# Patient Record
Sex: Female | Born: 1980 | Race: Black or African American | Hispanic: No | Marital: Single | Smoking: Former smoker
Health system: Southern US, Community
[De-identification: ages and names within clinical notes are randomized; demographics above are authoritative.]

## PROBLEM LIST (undated history)

## (undated) ENCOUNTER — Emergency Department (HOSPITAL_COMMUNITY)

## (undated) ENCOUNTER — Inpatient Hospital Stay (HOSPITAL_COMMUNITY): Payer: Self-pay

## (undated) DIAGNOSIS — E119 Type 2 diabetes mellitus without complications: Secondary | ICD-10-CM

## (undated) DIAGNOSIS — G4730 Sleep apnea, unspecified: Secondary | ICD-10-CM

## (undated) DIAGNOSIS — D649 Anemia, unspecified: Secondary | ICD-10-CM

## (undated) DIAGNOSIS — I10 Essential (primary) hypertension: Secondary | ICD-10-CM

## (undated) HISTORY — PX: TOOTH EXTRACTION: SUR596

---

## 2008-06-22 ENCOUNTER — Encounter: Payer: Self-pay | Admitting: Obstetrics and Gynecology

## 2008-06-22 ENCOUNTER — Ambulatory Visit: Payer: Self-pay | Admitting: Obstetrics and Gynecology

## 2008-06-22 ENCOUNTER — Inpatient Hospital Stay (HOSPITAL_COMMUNITY): Admission: AD | Admit: 2008-06-22 | Discharge: 2008-06-22 | Payer: Self-pay | Admitting: Obstetrics and Gynecology

## 2008-08-11 ENCOUNTER — Emergency Department (HOSPITAL_COMMUNITY): Admission: EM | Admit: 2008-08-11 | Discharge: 2008-08-11 | Payer: Self-pay | Admitting: Emergency Medicine

## 2008-10-08 ENCOUNTER — Emergency Department (HOSPITAL_COMMUNITY): Admission: EM | Admit: 2008-10-08 | Discharge: 2008-10-08 | Payer: Self-pay | Admitting: Emergency Medicine

## 2008-10-10 ENCOUNTER — Emergency Department (HOSPITAL_COMMUNITY): Admission: EM | Admit: 2008-10-10 | Discharge: 2008-10-10 | Payer: Self-pay | Admitting: Emergency Medicine

## 2008-10-17 ENCOUNTER — Emergency Department (HOSPITAL_COMMUNITY): Admission: EM | Admit: 2008-10-17 | Discharge: 2008-10-17 | Payer: Self-pay | Admitting: Emergency Medicine

## 2008-11-24 ENCOUNTER — Emergency Department (HOSPITAL_COMMUNITY): Admission: EM | Admit: 2008-11-24 | Discharge: 2008-11-24 | Payer: Self-pay | Admitting: Emergency Medicine

## 2009-02-19 ENCOUNTER — Emergency Department (HOSPITAL_COMMUNITY): Admission: EM | Admit: 2009-02-19 | Discharge: 2009-02-19 | Payer: Self-pay | Admitting: Emergency Medicine

## 2009-02-20 ENCOUNTER — Inpatient Hospital Stay (HOSPITAL_COMMUNITY): Admission: AD | Admit: 2009-02-20 | Discharge: 2009-02-20 | Payer: Self-pay | Admitting: Family Medicine

## 2009-04-18 ENCOUNTER — Inpatient Hospital Stay (HOSPITAL_COMMUNITY): Admission: AD | Admit: 2009-04-18 | Discharge: 2009-04-18 | Payer: Self-pay | Admitting: Obstetrics

## 2009-04-18 ENCOUNTER — Ambulatory Visit: Payer: Self-pay | Admitting: Physician Assistant

## 2009-05-30 ENCOUNTER — Ambulatory Visit: Payer: Self-pay | Admitting: Advanced Practice Midwife

## 2009-05-30 ENCOUNTER — Inpatient Hospital Stay (HOSPITAL_COMMUNITY): Admission: AD | Admit: 2009-05-30 | Discharge: 2009-05-31 | Payer: Self-pay | Admitting: Obstetrics

## 2009-05-31 ENCOUNTER — Inpatient Hospital Stay (HOSPITAL_COMMUNITY): Admission: AD | Admit: 2009-05-31 | Discharge: 2009-05-31 | Payer: Self-pay | Admitting: Obstetrics

## 2009-10-06 ENCOUNTER — Ambulatory Visit (HOSPITAL_COMMUNITY): Admission: RE | Admit: 2009-10-06 | Discharge: 2009-10-06 | Payer: Self-pay | Admitting: Obstetrics

## 2009-11-21 IMAGING — US US OB COMP LESS 14 WK
1 series · 14 of 28 positions shown · non-contrast
Comparison: None

CLINICAL DATA: Abnormal vaginal bleeding.

OBSTETRIC <14 WK ULTRASOUND,TRANSVAGINAL OB ULTRASOUND - MODIFY
TECHNIQUE: Transabdominal ultrasound was performed?for evaluation
of?the gestation, as well as the maternal uterus and adnexal
regions.,

[Series 1: us ob comp less 14 wks · 14 of 45 slices shown]
[im 2/45]
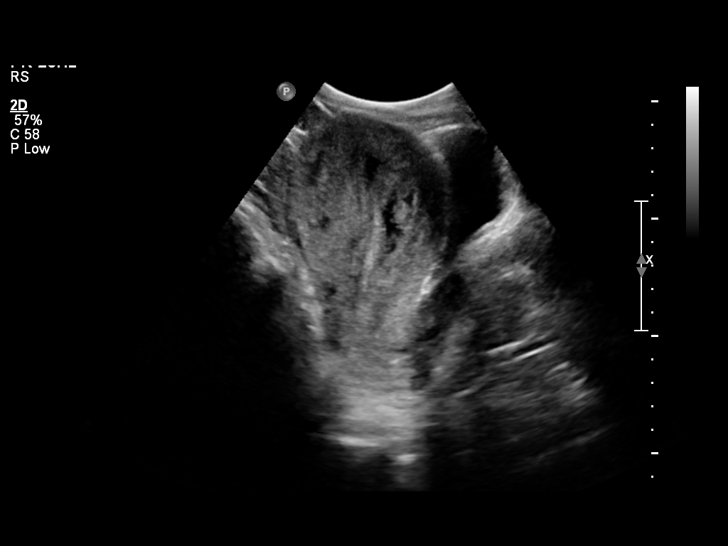
[im 5/45]
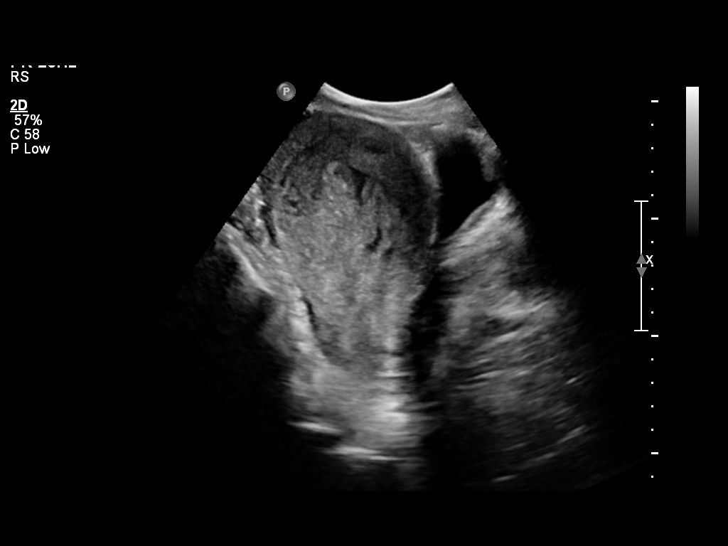
[im 9/45]
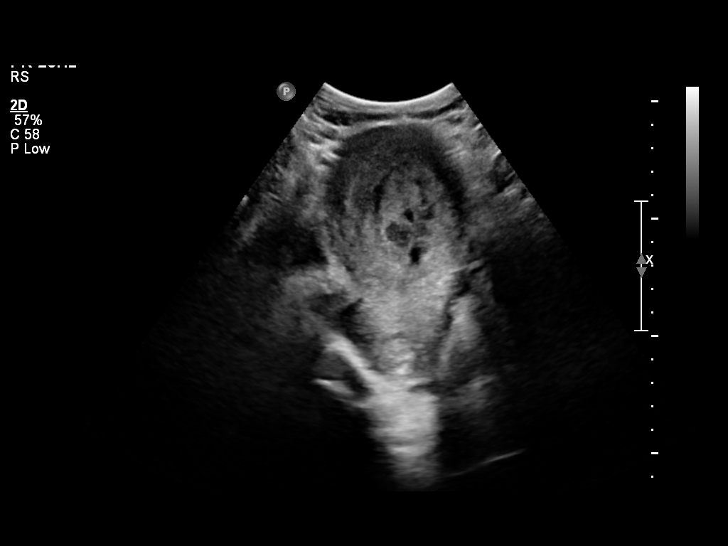
[im 12/45]
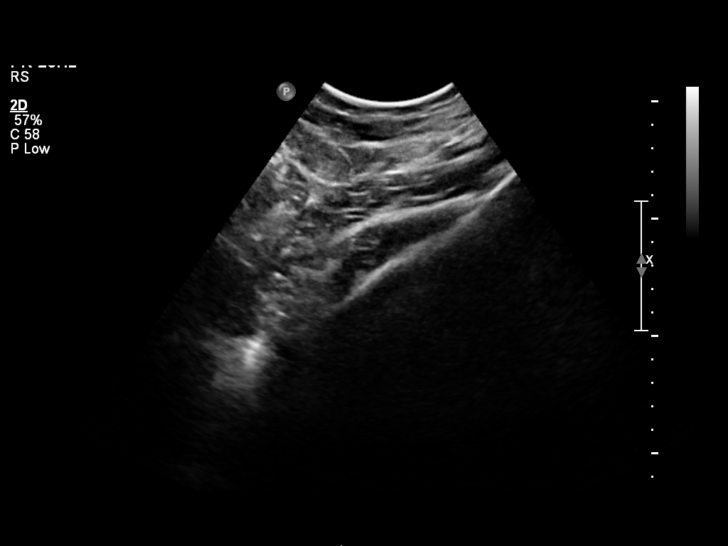
[im 15/45]
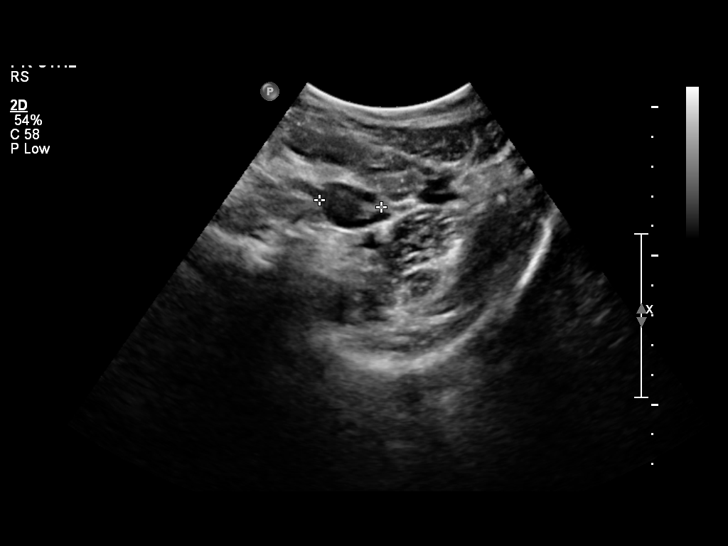
[im 18/45]
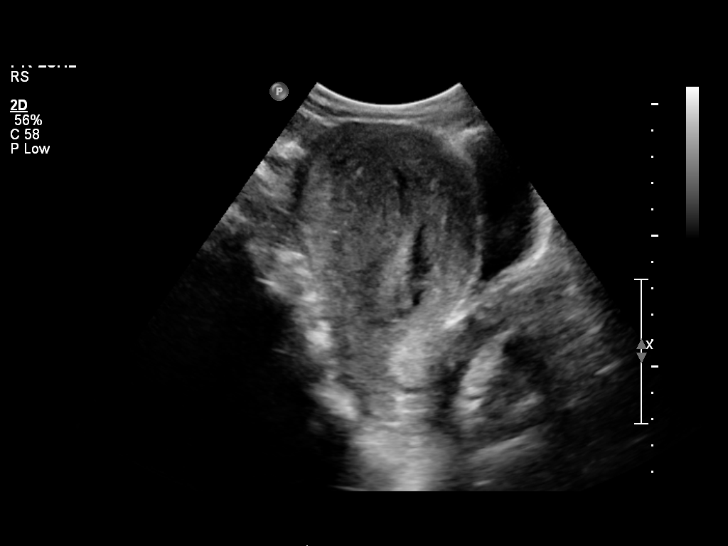
[im 22/45]
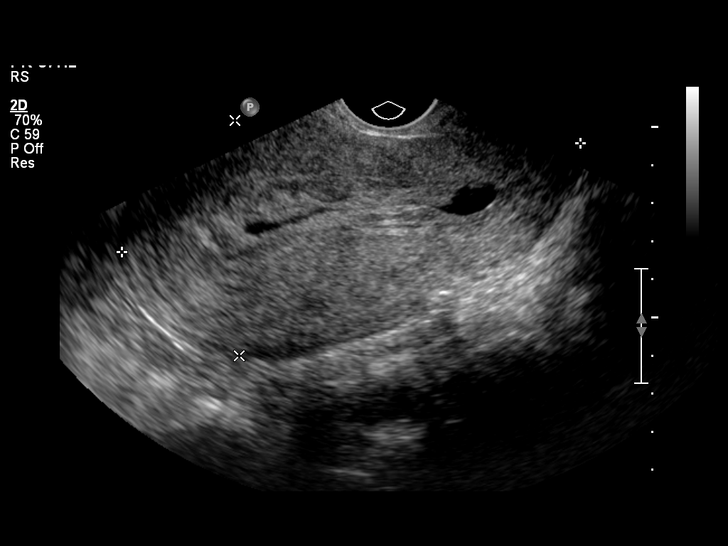
[im 25/45]
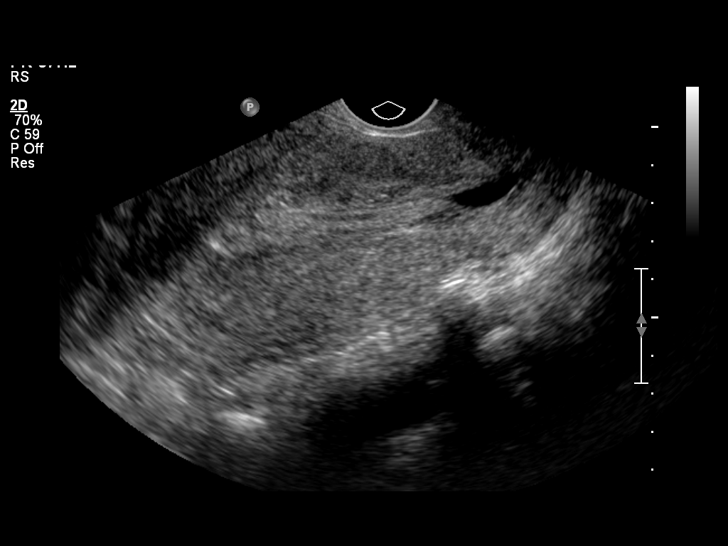
[im 28/45]
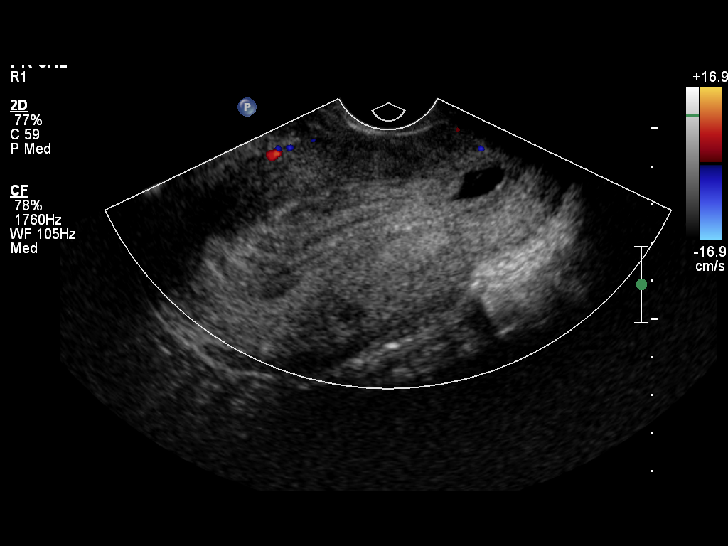
[im 31/45]
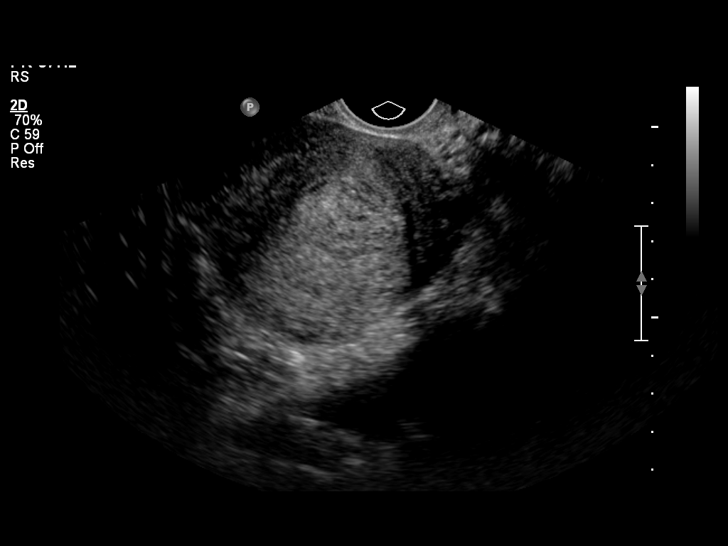
[im 35/45]
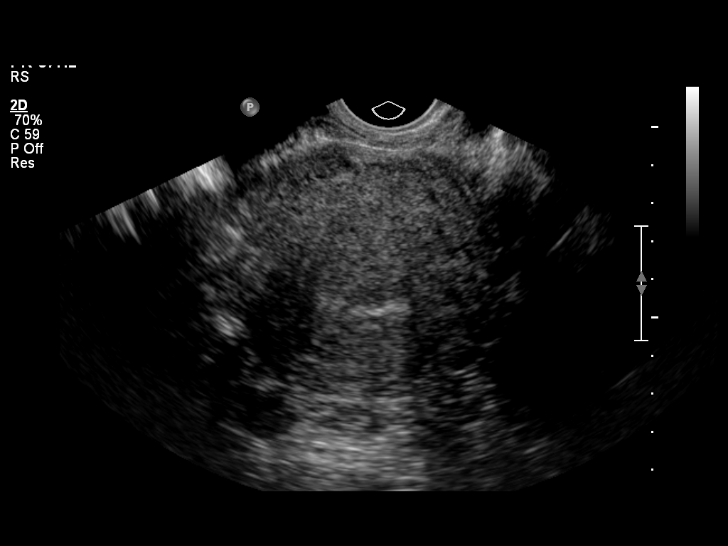
[im 38/45]
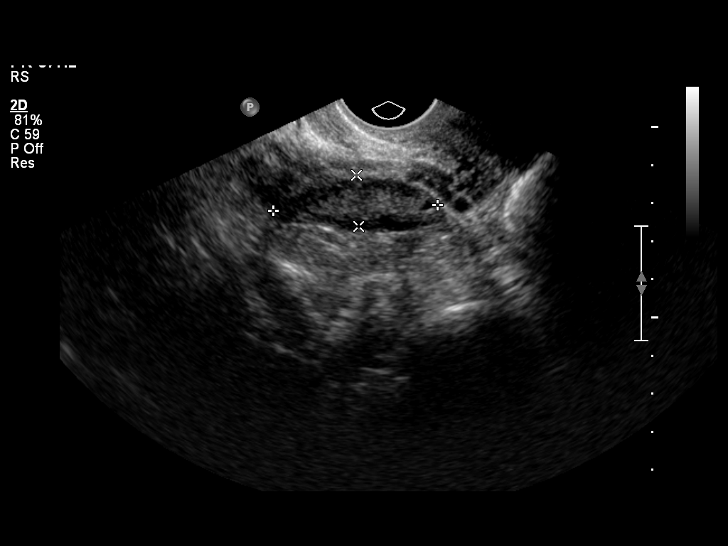
[im 41/45]
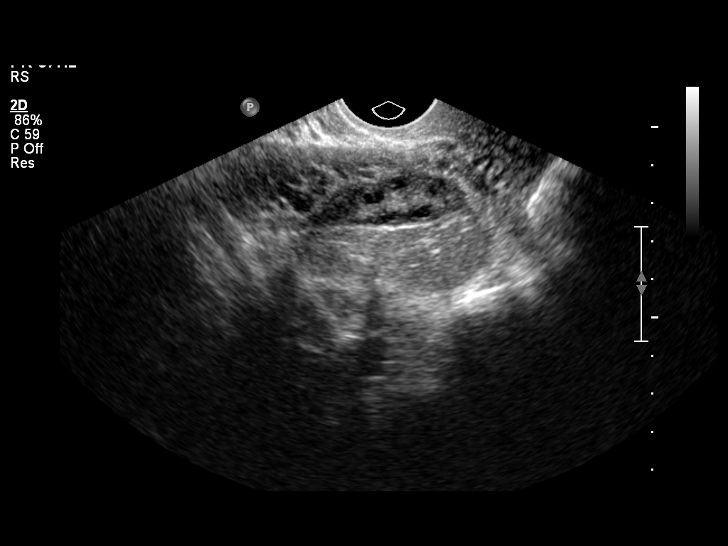
[im 45/45]
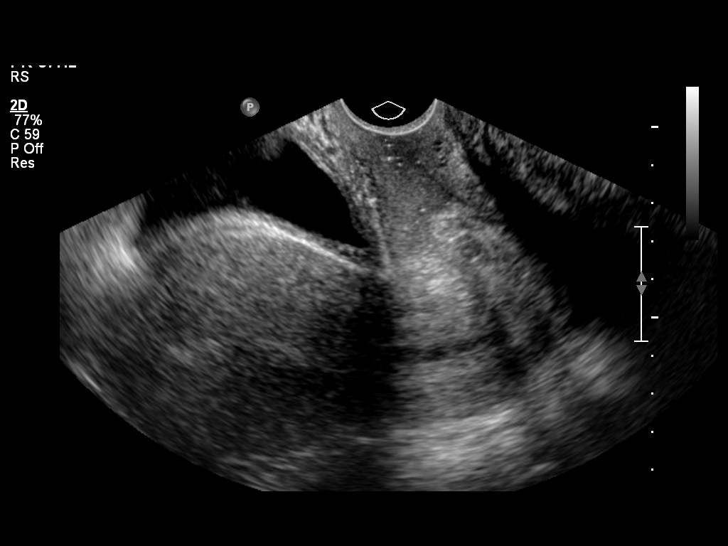

[14 of 28 positions shown; findings below may reference images not displayed]

FINDINGS: There is a distorted  small gestational sac in the lower uterine
segment with an oblong configuration without a fetal pole or yolk
sac.  The endometrium is slightly thickened and heterogeneous
without blood flow.  I feel this most likely represents a
spontaneous abortion in progress.

The ovaries are well visualized and appear normal.  There is only a
trace of free fluid in the pelvic cul-de-sac.
IMPRESSION: Spontaneous abortion in progress.  Distorted gestational sac with
no evidence of a fetus or yolk sac.

## 2009-12-24 ENCOUNTER — Ambulatory Visit (HOSPITAL_COMMUNITY): Admission: RE | Admit: 2009-12-24 | Discharge: 2009-12-24 | Payer: Self-pay | Admitting: Obstetrics

## 2010-01-05 ENCOUNTER — Emergency Department (HOSPITAL_COMMUNITY): Admission: EM | Admit: 2010-01-05 | Discharge: 2010-01-05 | Payer: Self-pay | Admitting: Emergency Medicine

## 2010-01-10 IMAGING — US US TRANSVAGINAL NON-OB
1 series · 14 of 25 positions shown · non-contrast
Comparison: Ultrasound 06/22/2008

CLINICAL DATA: Irregular bleeding

TRANSABDOMINAL AND TRANSVAGINAL ULTRASOUND OF PELVIS
TECHNIQUE: Both transabdominal and transvaginal ultrasound
examinations of the pelvis were performed including evaluation of
the uterus, ovaries, adnexal regions, and pelvic cul-de-sac.

[Series 1: us transvaginal non-ob · 0.26mm/px · 14 of 48 slices shown]
[im 1/48]
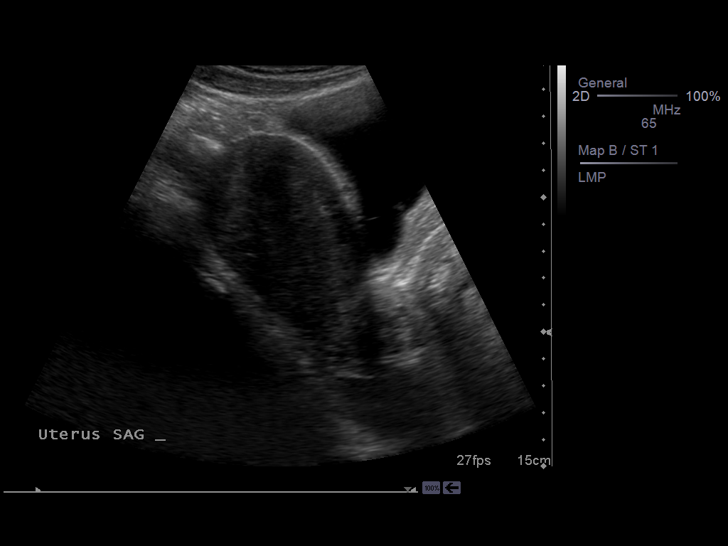
[im 4/48]
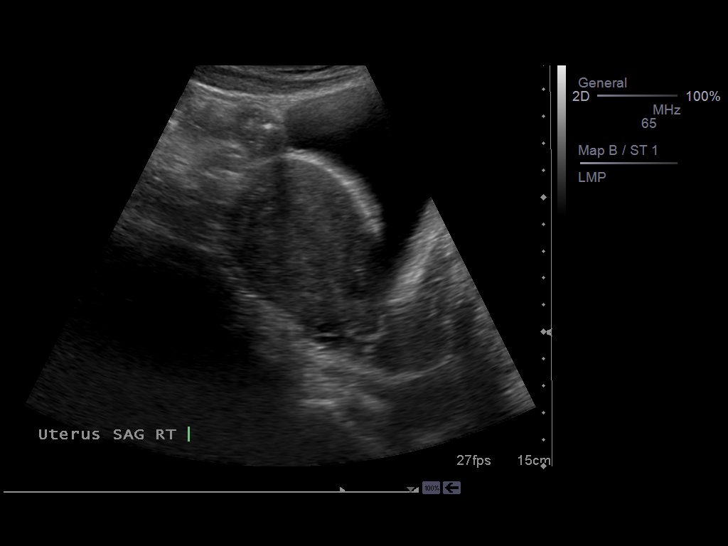
[im 8/48]
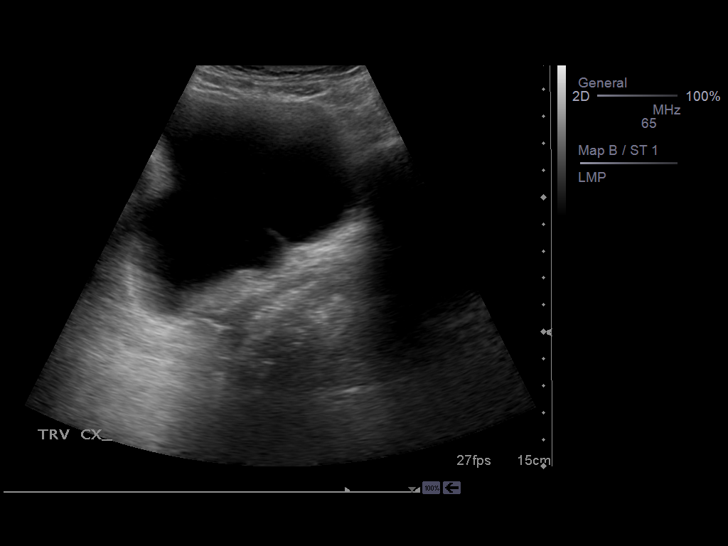
[im 12/48]
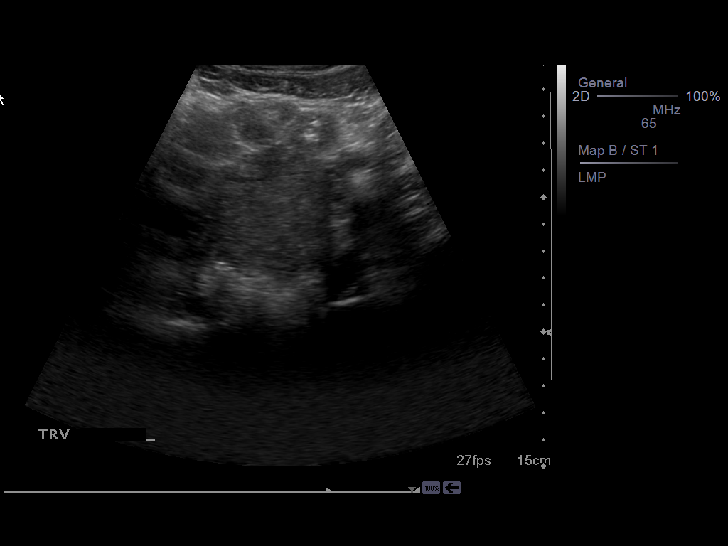
[im 16/48]
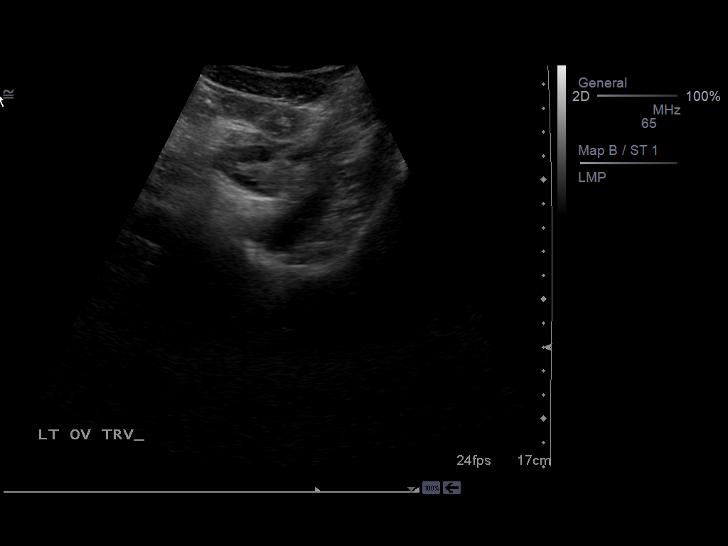
[im 18/48]
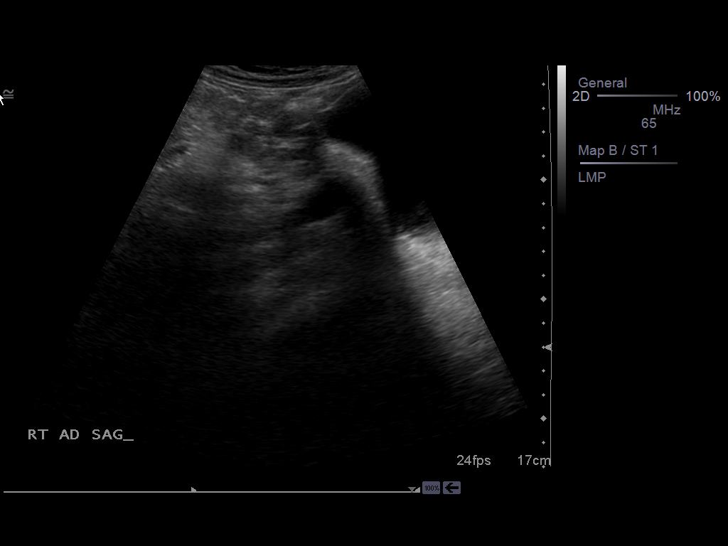
[im 22/48]
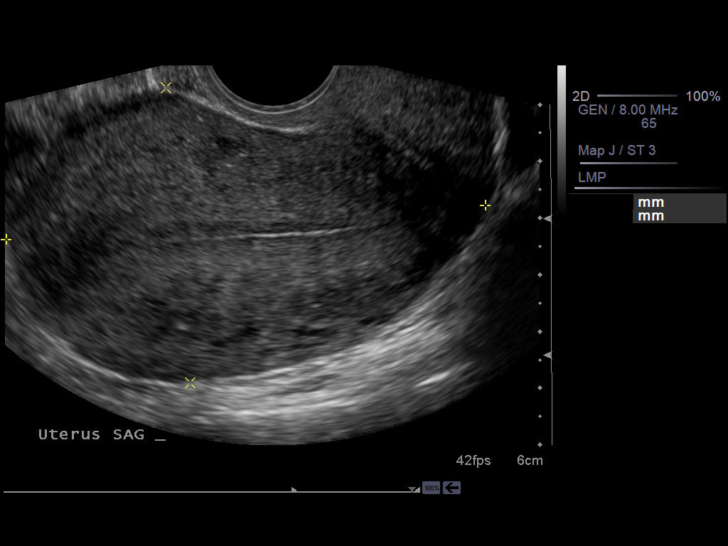
[im 26/48]
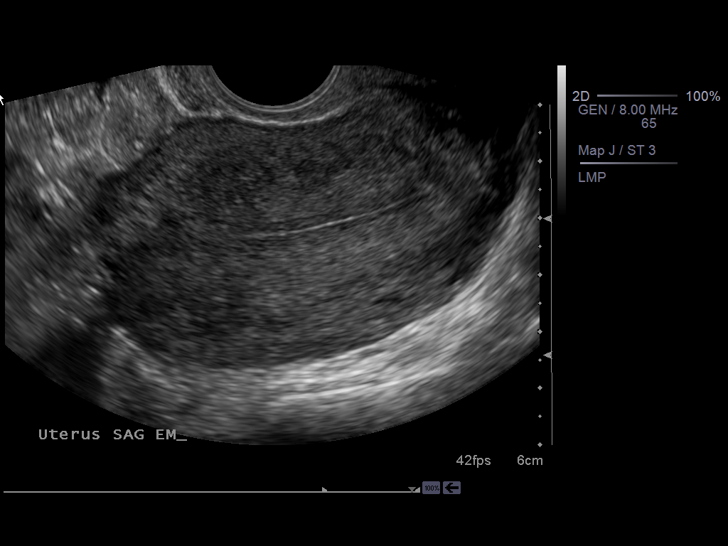
[im 30/48]
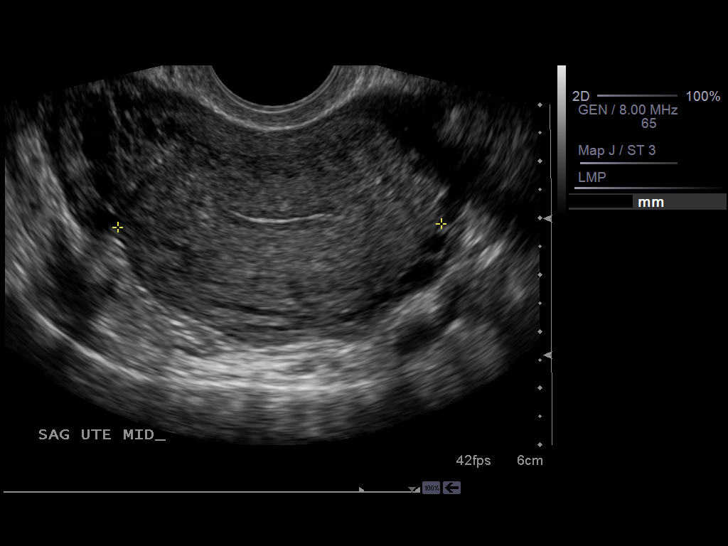
[im 32/48]
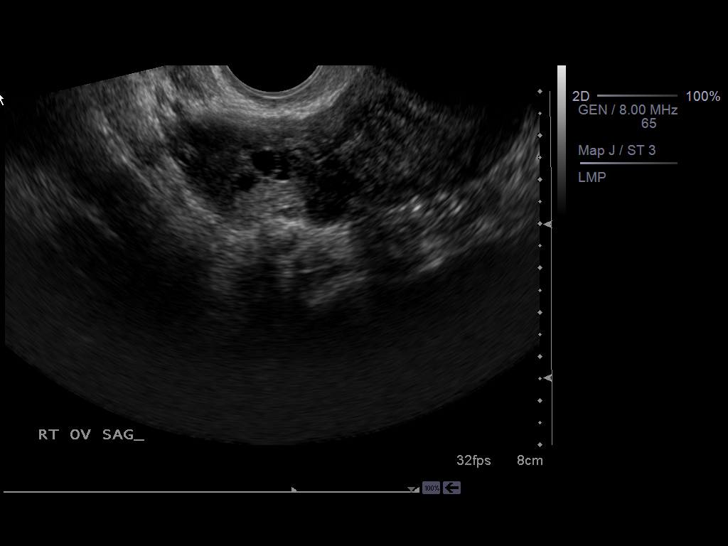
[im 36/48]
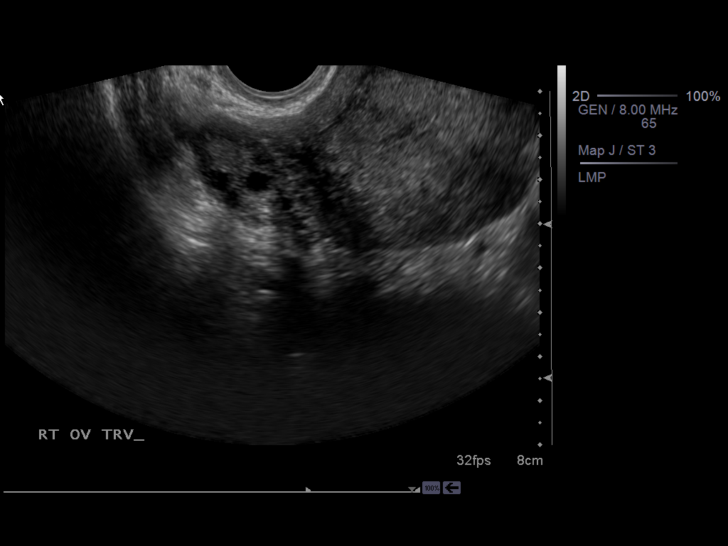
[im 40/48]
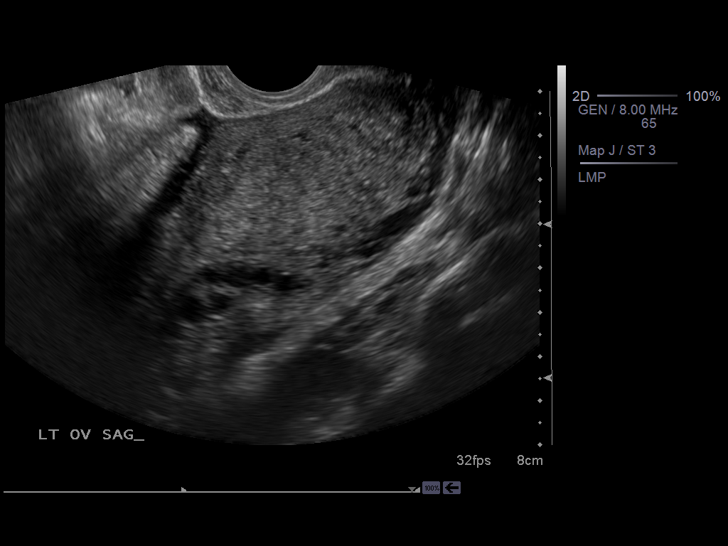
[im 44/48]
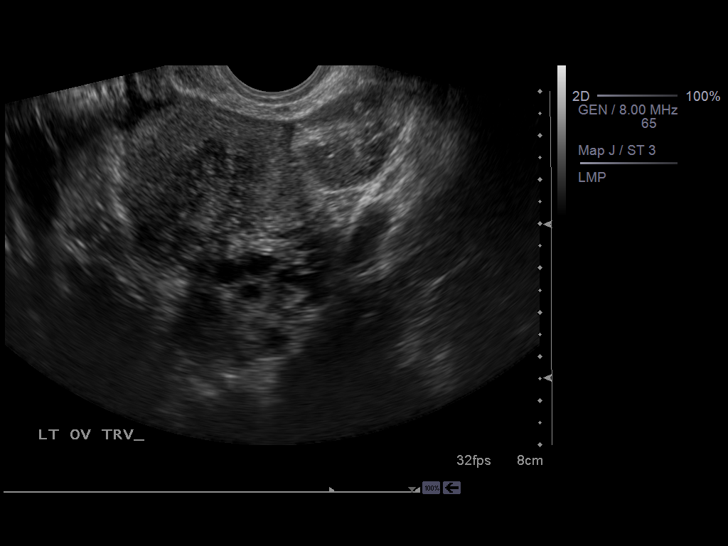
[im 48/48]
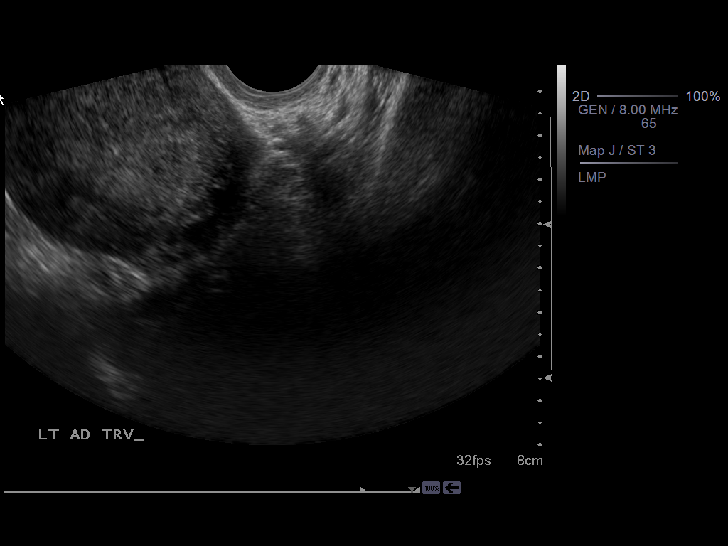

[14 of 25 positions shown; findings below may reference images not displayed]

FINDINGS: Uterus measures 9.6 cm in length and the fundus of the
uterus measures 5.2 x 6.1 cm in transverse dimensions.  Endometrial
complex measures 6 mm in thickness.  Right ovary measures 4.1 x
x 2.4 cm.  Left ovary measures 3.9 x 2.5 x 3.3 cm.  There are
bilateral ovarian follicles.  No adnexal mass is appreciated.
Moderate amount of free fluid.
IMPRESSION: Moderate free fluid, otherwise negative exam

## 2010-01-18 ENCOUNTER — Emergency Department (HOSPITAL_COMMUNITY): Admission: EM | Admit: 2010-01-18 | Discharge: 2010-01-18 | Payer: Self-pay | Admitting: Emergency Medicine

## 2010-02-07 ENCOUNTER — Encounter: Payer: Self-pay | Admitting: Orthopedic Surgery

## 2010-03-03 ENCOUNTER — Observation Stay (HOSPITAL_COMMUNITY): Admission: RE | Admit: 2010-03-03 | Discharge: 2010-03-03 | Payer: Self-pay | Admitting: Obstetrics

## 2010-03-08 ENCOUNTER — Ambulatory Visit: Payer: Self-pay | Admitting: Obstetrics & Gynecology

## 2010-03-08 ENCOUNTER — Inpatient Hospital Stay (HOSPITAL_COMMUNITY): Admission: AD | Admit: 2010-03-08 | Discharge: 2010-03-08 | Payer: Self-pay | Admitting: Obstetrics & Gynecology

## 2010-03-09 ENCOUNTER — Inpatient Hospital Stay (HOSPITAL_COMMUNITY): Admission: AD | Admit: 2010-03-09 | Discharge: 2010-03-15 | Payer: Self-pay | Admitting: Obstetrics

## 2010-03-12 ENCOUNTER — Encounter: Payer: Self-pay | Admitting: Obstetrics

## 2010-03-17 ENCOUNTER — Inpatient Hospital Stay (HOSPITAL_COMMUNITY): Admission: AD | Admit: 2010-03-17 | Discharge: 2010-03-19 | Payer: Self-pay | Admitting: Obstetrics

## 2010-03-17 ENCOUNTER — Encounter: Payer: Self-pay | Admitting: Emergency Medicine

## 2010-03-18 IMAGING — CT CT ANGIO CHEST
2 of 6 series · 19 of 36 positions shown · IV contrast (APPLIED)
Comparison: Chest x-rays same day

CLINICAL DATA: Elevated D-dimer, left breast pain

CHEST CT WITHOUT CONTRAST
TECHNIQUE: Multidetector CT imaging of the chest was performed
following the standard protocol without intravenous contrast.

[Series 10: pulm embolism 1.0 b25f thins · axial · 0.62mm/px · z∈[-257,-27]mm · 18 of 256 slices shown]
[im 13/256  lung]
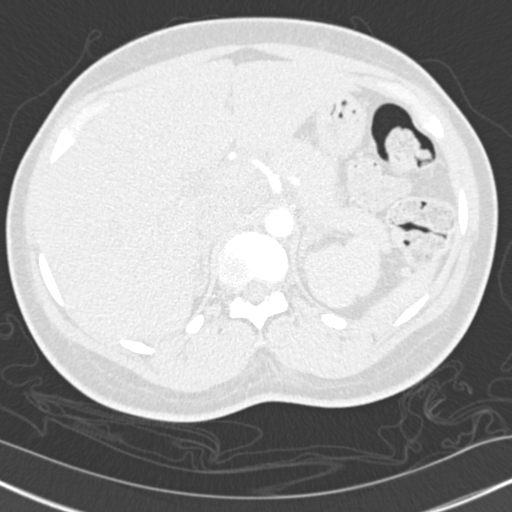
[im 26/256  mediastinal]
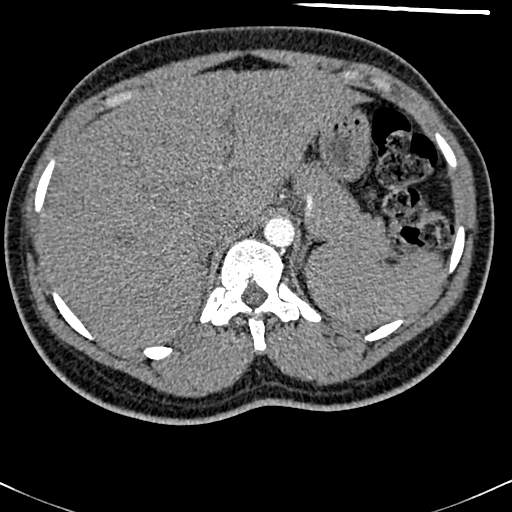
[im 39/256  lung]
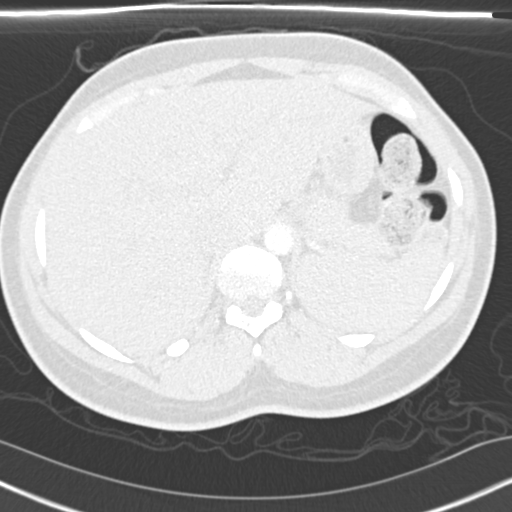
[im 52/256  mediastinal]
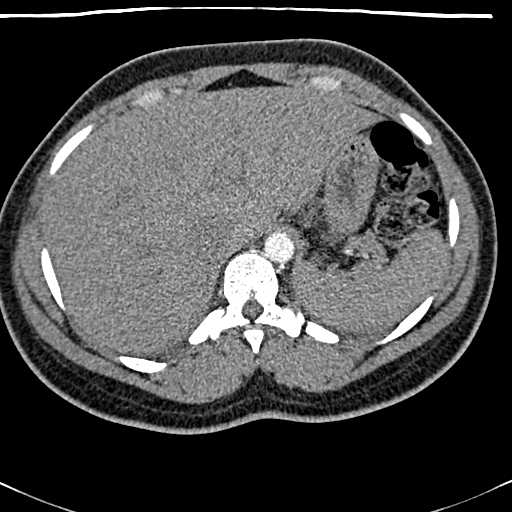
[im 64/256  lung]
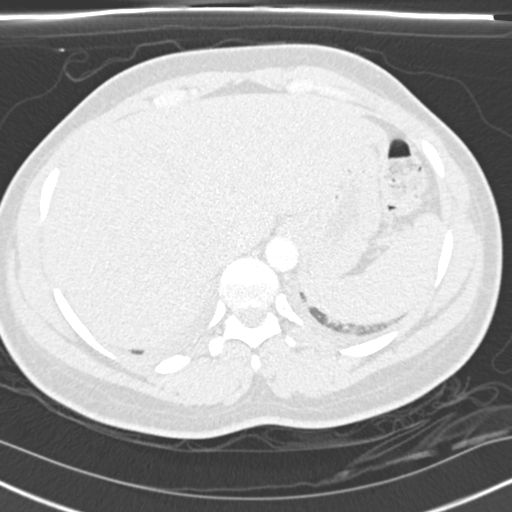
[im 77/256  mediastinal]
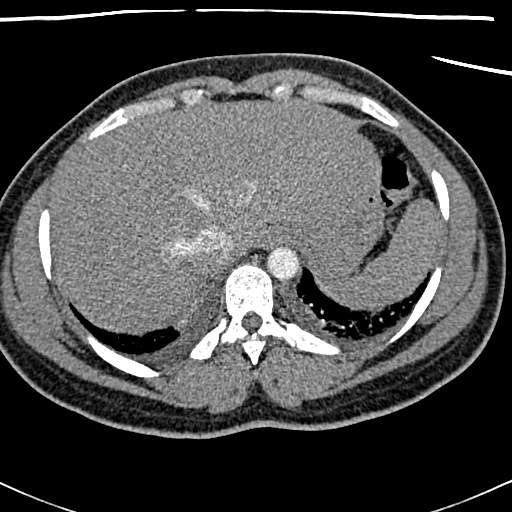
[im 90/256  lung]
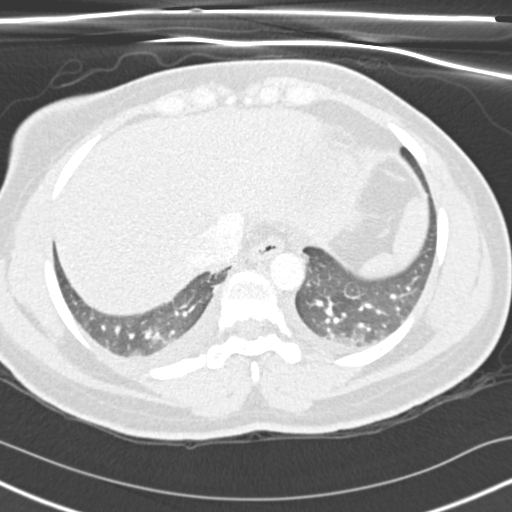
[im 103/256  mediastinal]
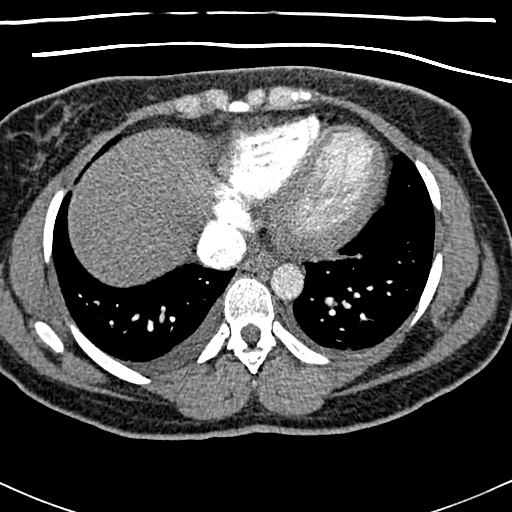
[im 115/256  lung]
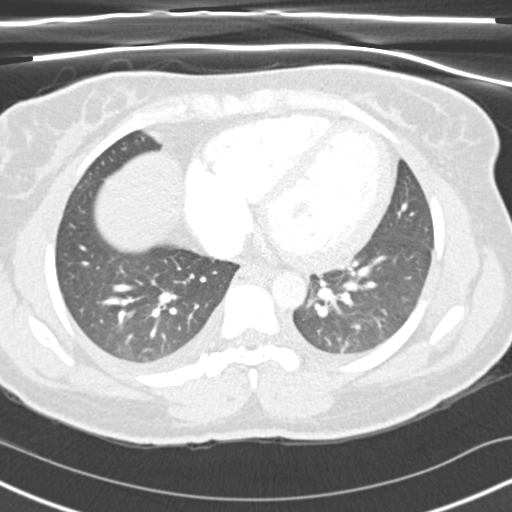
[im 141/256  mediastinal]
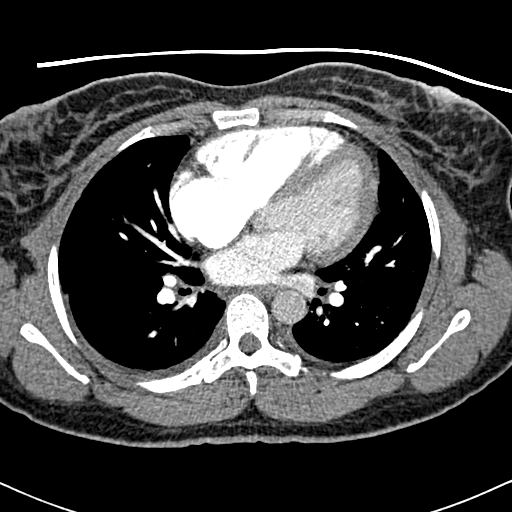
[im 154/256  lung]
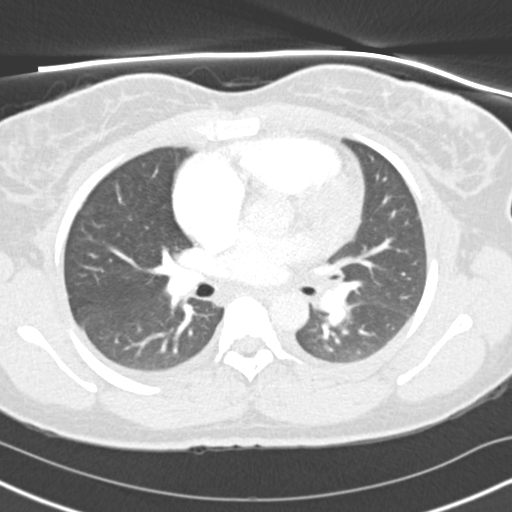
[im 166/256  mediastinal]
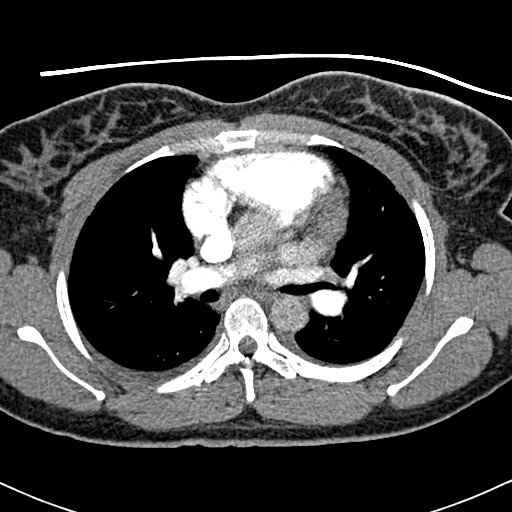
[im 179/256  lung]
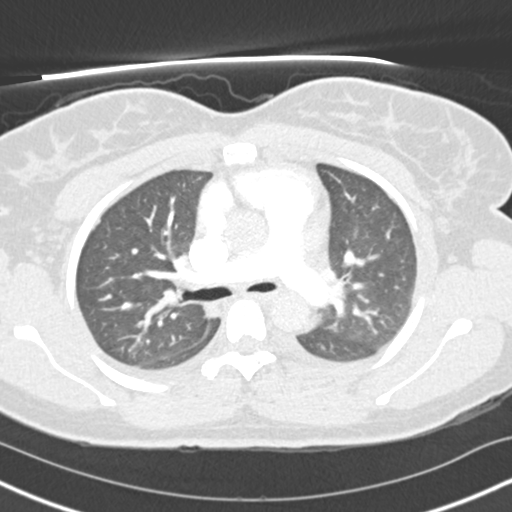
[im 192/256  mediastinal]
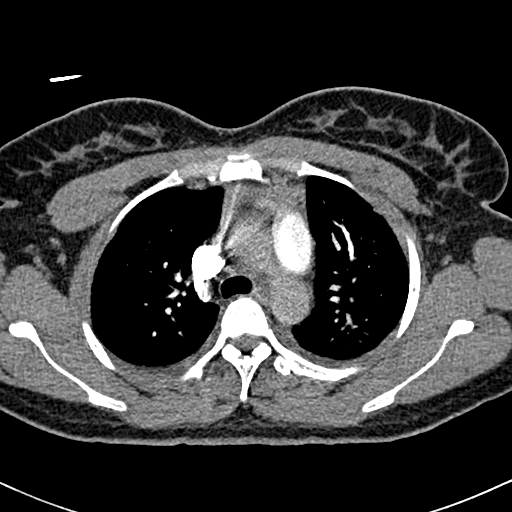
[im 205/256  lung]
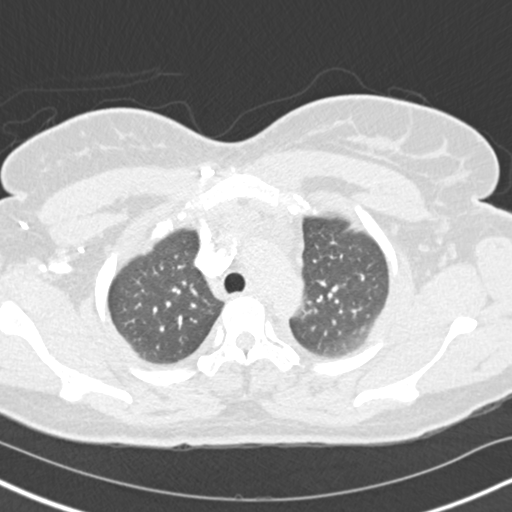
[im 217/256  mediastinal]
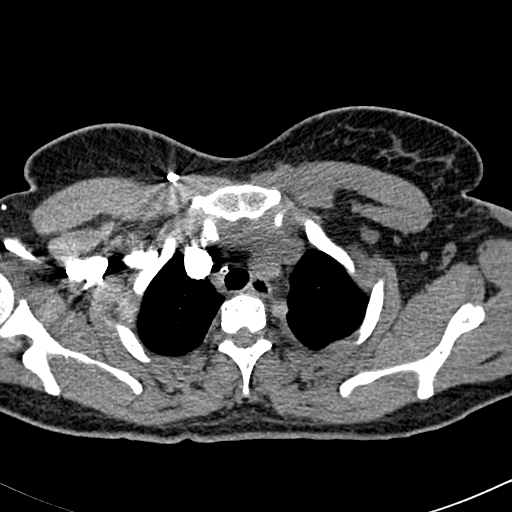
[im 230/256  lung]
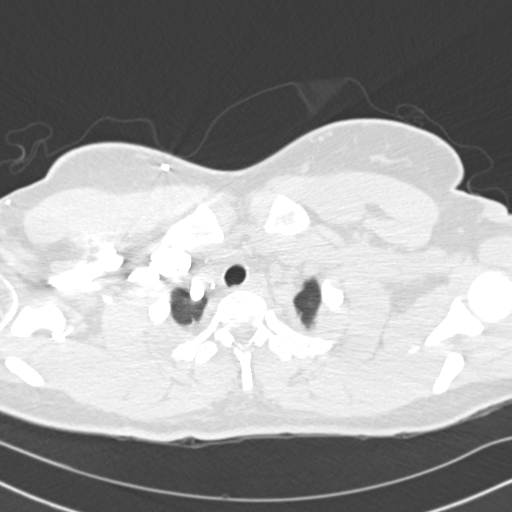
[im 243/256  mediastinal]
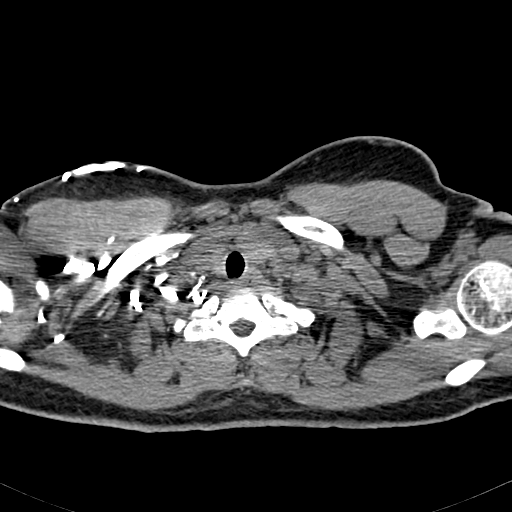

[Series 11: pulm embolism 2.0 cor · coronal · 0.62mm/px · 1 of 88 slices shown]
[im 44/88  mediastinal]
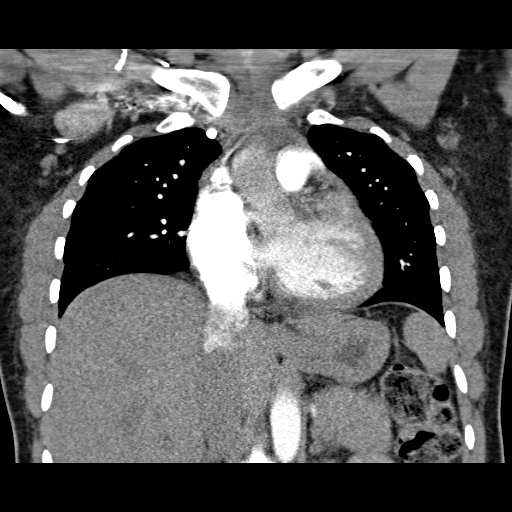

[19 of 36 positions shown; findings below may reference images not displayed]

FINDINGS: Images of the thoracic inlet are unremarkable.
Borderline cardiomegaly noted.  No acute infiltrate or edema.

No mediastinal hematoma.  No adenopathy.

No destructive bony lesions are noted.

The central airways are patent.  The visualized upper abdomen is
unremarkable.  No adrenal gland mass is noted.

Bilateral small pleural effusion is noted right greater than left.
IMPRESSION: 1.  No pulmonary embolus is noted.
2.  No acute infiltrate or edema.
3.  Bilateral small pleural effusions right greater than left.

## 2010-03-18 IMAGING — CR DG CHEST 2V
2 series · 2 of 2 positions shown · non-contrast
Comparison: None

CLINICAL DATA: Chest pain

CHEST - 2 VIEW

[view not recorded (1 of 2)]
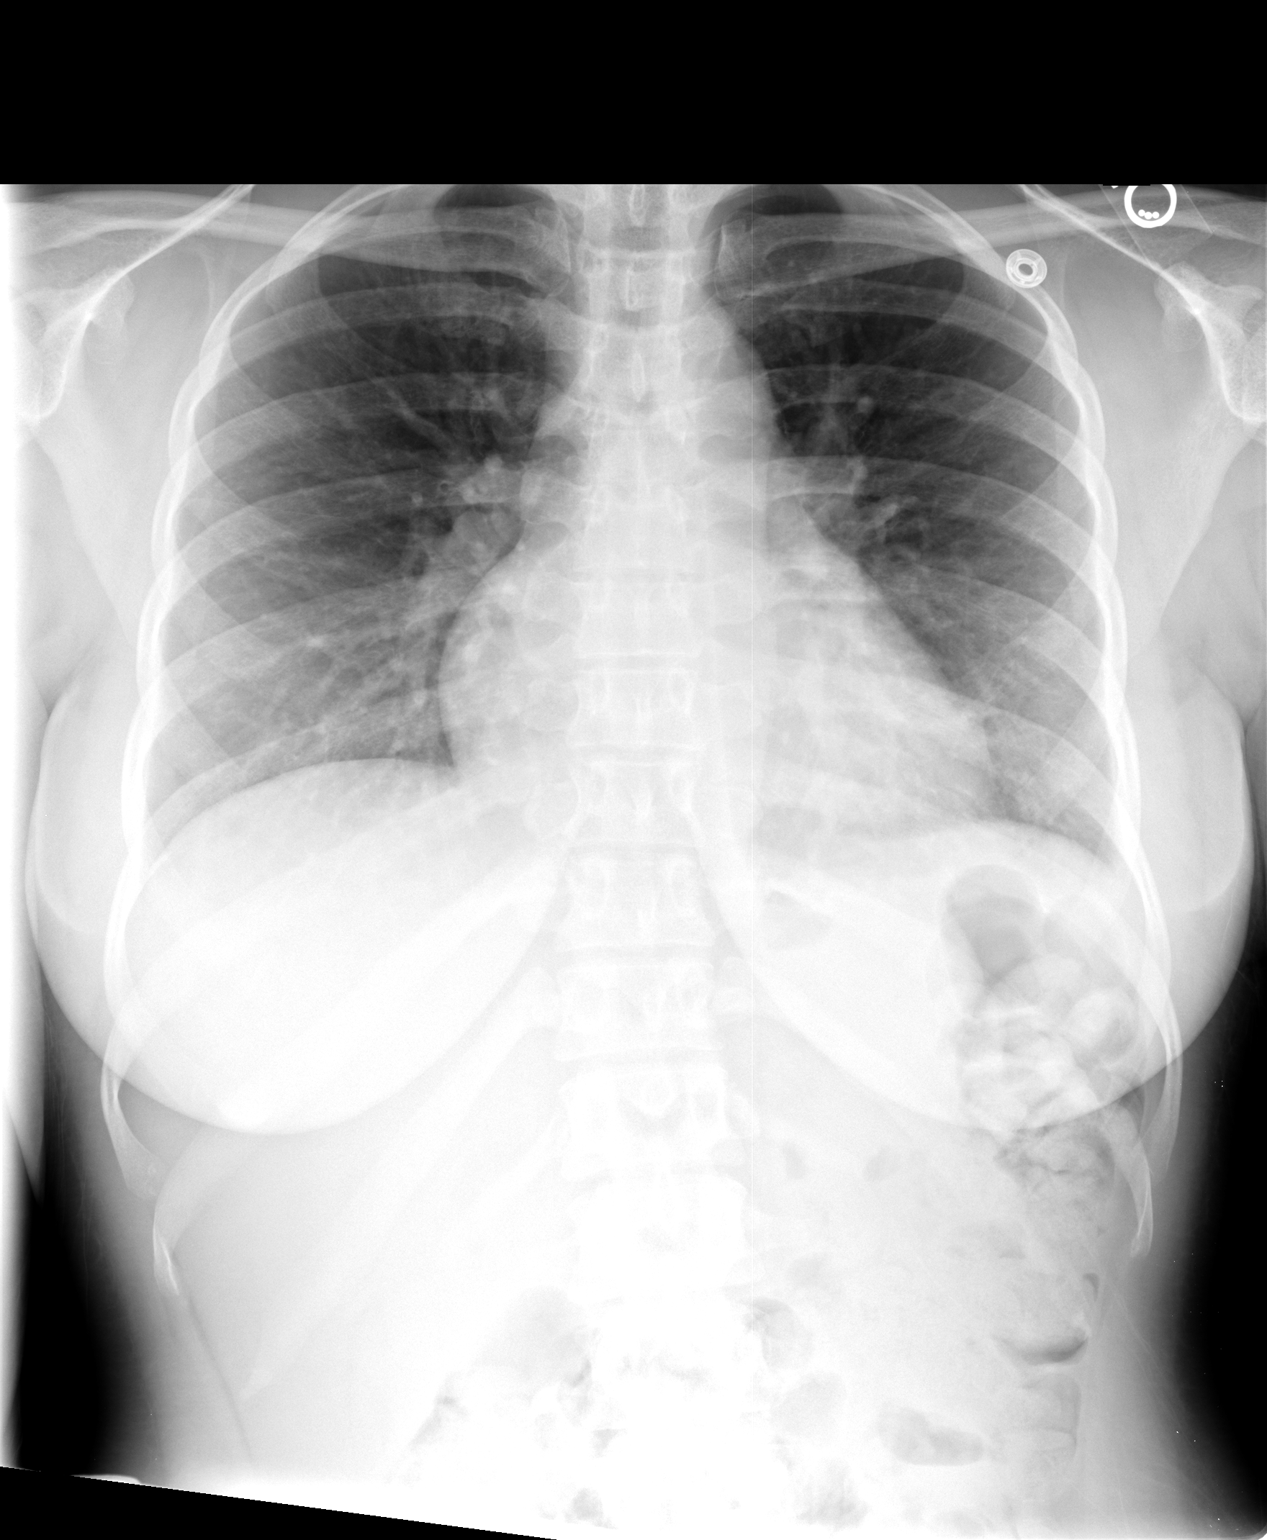

[view not recorded (2 of 2)]
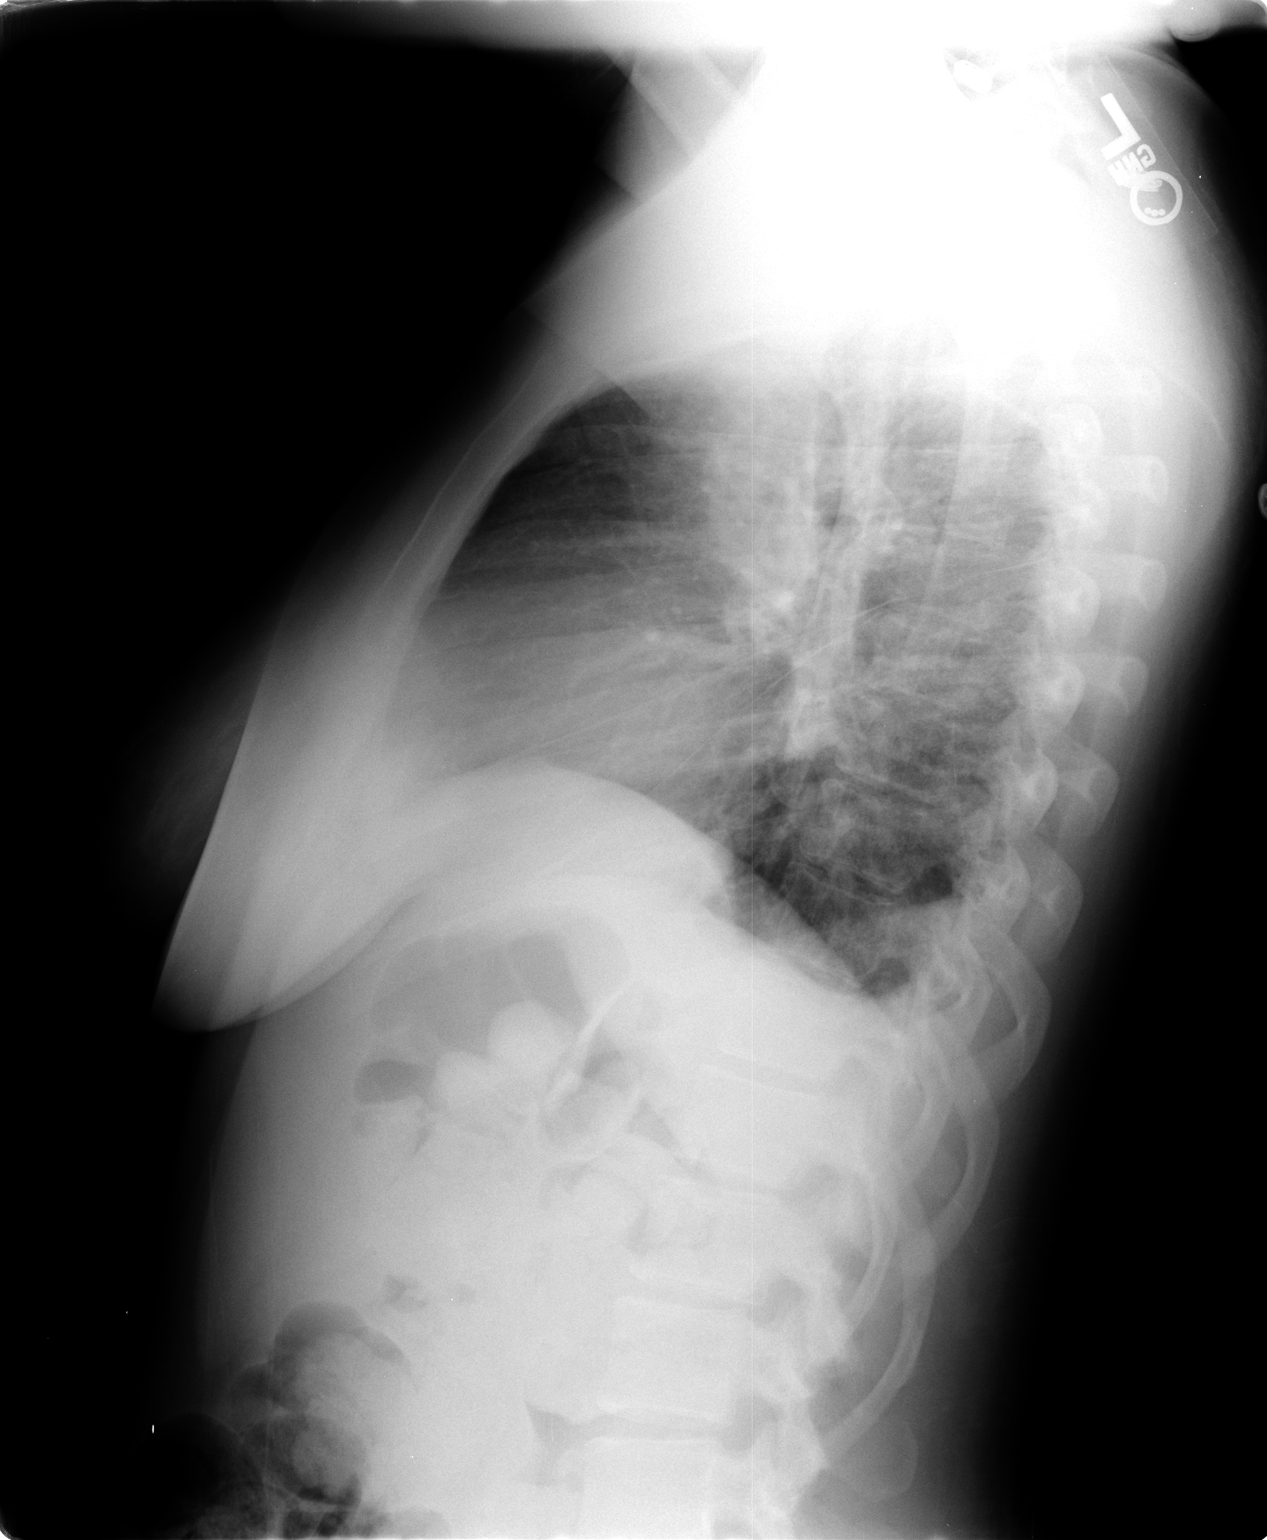

[2 of 2 positions shown; findings below may reference images not displayed]

FINDINGS: Cardiomediastinal silhouette is unremarkable.  Bony
thorax is unremarkable.  No pneumothorax.

No acute infiltrate or pleural effusion.  No pulmonary edema.
IMPRESSION: No active disease.

## 2010-03-29 ENCOUNTER — Ambulatory Visit: Payer: Self-pay | Admitting: Orthopedic Surgery

## 2010-03-29 DIAGNOSIS — M654 Radial styloid tenosynovitis [de Quervain]: Secondary | ICD-10-CM | POA: Insufficient documentation

## 2010-04-06 ENCOUNTER — Encounter: Payer: Self-pay | Admitting: Orthopedic Surgery

## 2010-04-06 ENCOUNTER — Telehealth: Payer: Self-pay | Admitting: Orthopedic Surgery

## 2010-04-20 ENCOUNTER — Ambulatory Visit: Payer: Self-pay | Admitting: Orthopedic Surgery

## 2010-07-04 ENCOUNTER — Encounter: Payer: Self-pay | Admitting: Obstetrics

## 2010-07-05 ENCOUNTER — Encounter: Payer: Self-pay | Admitting: Emergency Medicine

## 2010-07-13 NOTE — Assessment & Plan Note (Signed)
Summary: mc er 7/26,8/5 left hand,thumbn pain no xr done/reg mcd/bsf   Vital Signs:  Patient profile:   30 year old female Height:      64 inches Weight:      226 pounds Pulse rate:   82 / minute Resp:     16 per minute  Vitals Entered By: Fuller Canada MD (March 29, 2010 4:36 PM)  Visit Type:  new patient Referring Provider:  ap er Primary Provider:  [facility]  CC:  pain in left thumb.  History of Present Illness: I saw Caitlyn [name] in the office today for an initial visit.  She is a 30 years old woman with the complaint of:  pain in the left thumb to forearm.  DOI 12/06/09.  No xrays.  Meds: none.  Pain in the LEFT thumb since June 26.  The patient says she was at work using his she started having increased pain in her LEFT thumb and she could move it she was seen in the emergency room and was placed in a brace which did not help she wore that on and off for one month and present in a second time to a doctor for evaluation and treatment.  She was diagnosed with possible tendinitis of the thumb she was treated with some ibuprofen.  She says the pain is a 10 minutes constant is also sure it hurts morning and night and came on suddenly.  She reports improvement in her symptoms if she does not move her thumb and is worse if she tries to perform any pinching maneuvers.    Allergies (verified): 1)  ! Codeine  Past History:  Past Medical History: none  Past Surgical History: c section x 1  Family History: na  Social History: Patient is single.  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  Review of Systems Constitutional:  Complains of weight gain; denies weight loss, fever, chills, and fatigue. Cardiovascular:  Denies chest pain, palpitations, fainting, and murmurs. Respiratory:  Denies short of breath, wheezing, couch, tightness, pain on inspiration, and snoring . Gastrointestinal:  Denies heartburn, nausea, vomiting, diarrhea,  constipation, and blood in your stools. Genitourinary:  Denies frequency, urgency, difficulty urinating, painful urination, flank pain, and bleeding in urine. Neurologic:  Denies numbness, tingling, unsteady gait, dizziness, tremors, and seizure. Musculoskeletal:  Complains of joint pain, swelling, stiffness, and muscle pain; denies instability, redness, and heat. Endocrine:  Denies excessive thirst, exessive urination, and heat or cold intolerance. Psychiatric:  Denies nervousness, depression, anxiety, and hallucinations. Skin:  Denies changes in the skin, poor healing, rash, itching, and redness. HEENT:  Denies blurred or double vision, eye pain, redness, and watering. Immunology:  Denies seasonal allergies, sinus problems, and allergic to bee stings. Hemoatologic:  Denies easy bleeding and brusing.  Physical Exam  Additional Exam:  She is slightly overweight well-developed well-nourished  Ears normal perfusion her extremity and no lymphadenopathy  Her skin over the LEFT thumb is normal.  She has normal sensation in the LEFT hand.  She is awake alert and oriented x3 mood and affect are normal.  She ambulates normally  She has swelling and tenderness over the extensor compartment #1 the LEFT she has normal opposition to the small finger.  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 There were no complications.   The patient was checking out and then told me that this may be a workers compensation claim and that she wanted me  to speak with her lawyer is her RIGHT thumb and no.  I do not have the overall complete details of how this may or may not be workers comp situation so I asked her to get a good physicians she saw before and to get a job description.  Overuse syndromes have not been proven definitively were disproven definitively to be workers compensation claim of injuries.  I tend to agree.  Orders: New Patient Level III (19147) Injection, Tendon / Ligament (82956) Depo- Medrol 40mg  (J1030)  Patient Instructions: 1)  You have received an injection of cortisone today. You may experience increased pain at the injection site. Apply ice pack to the area for 20 minutes every 2 hours and take 2 xtra strength tylenol every 8 hours. This increased pain will usually resolve in 24 hours. The injection will take effect in 3-10 days.  2)  3 weeks    Orders Added: 1)  New Patient Level III [21308] 2)  Injection, Tendon / Ligament [20550] 3)  Depo- Medrol 40mg  [J1030]  Appended Document: mc er 7/26,8/5 left hand,thumbn pain no xr done/reg mcd/bsf As stated in the medical record. Overuse Syndromes have not been definitively proven to be caused by work-related activities.  I tend to agree with that clinical data. There is not enough information in the patient's history to prove or disprove whether or not the de Quervain's syndrome was work related.

## 2010-07-13 NOTE — Letter (Signed)
Summary: Out of Work  [facility] [employer] [facility]  [address]  [place], [place] [address]   Phone: [phone]  Fax: [phone]    April 20, 2010   Employee:  THATIANA RENBARGER    To Whom It May Concern:   Ms Cardy may return to work on November 10th , 2011  Sincerely,    Fuller Canada MD

## 2010-07-13 NOTE — Letter (Signed)
Summary: Description of job duties  Description of job duties   Imported By: Jacklynn Ganong 04/06/2010 10:41:24  _____________________________________________________________________  External Attachment:    Type:   Image     Comment:   External Document

## 2010-07-13 NOTE — Assessment & Plan Note (Signed)
Summary: 3 WK RE-CK LT THUMB/MEDICAID/CAF   Visit Type:  Follow-up Referring Trevia Nop:  ap er Primary Francella Barnett:  [facility]  CC:  left thumb.  History of Present Illness: DX:  DEQUERVAIN'S   Treatment: injection, brace, anti-inflammatory  MEDS: none at this time  Complaints: She says that she is not having any pain  Today, scheduled for: recheck  DOI 12/06/09.     Allergies: 1)  ! Codeine  Physical Exam  Additional Exam:  the LEFT thumb has full range of motion no tenderness. No swelling, negative Finkelstein's, normal opposition to the small finger.   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 Patient Level II (29528)  Patient Instructions: 1)  Please schedule a follow-up appointment as needed. 2)  Medically cleared to return to work; return to work note provided.   Orders Added: 1)  Est. Patient Level II [41324]

## 2010-07-13 NOTE — Letter (Signed)
Summary: Out of Work  [facility]  [address]  [place], [place] [address]   Phone: [phone]  Fax: [phone]    April 20, 2010   Employee:  Caitlyn [name]    To Whom It May Concern:   For Medical reasons, please excuse the above named employee from work for the following dates:  Start:    End:    If you need additional information, please feel free to contact our office.         Sincerely,    Fuller Canada MD

## 2010-07-13 NOTE — Letter (Signed)
Summary: History From  History From   Imported By: Jacklynn Ganong 04/07/2010 15:26:09  _____________________________________________________________________  External Attachment:    Type:   Image     Comment:   External Document

## 2010-07-13 NOTE — Progress Notes (Signed)
Summary: job description  Phone Note Call from Patient   Summary of Call: I just scanned a description of job duties for [employer]  She said you were waiting for this to determine if workers compensation. Caitlyn [name]'s # [phone] Initial call taken by: Jacklynn Ganong,  April 06, 2010 10:45 AM

## 2010-07-22 IMAGING — US US TRANSVAGINAL NON-OB
1 series · 14 of 25 positions shown · non-contrast
Comparison: 08/11/2008

CLINICAL DATA: Heavy vaginal bleeding.

TRANSVAGINAL ULTRASOUND OF PELVIS
TECHNIQUE: Transvaginal ultrasound examination of the pelvis was
performed including evaluation of the uterus, ovaries, adnexal
regions, and pelvic cul-de-sac.

[Series 1: us transvaginal non-ob · 14 of 29 slices shown]
[im 1/29]
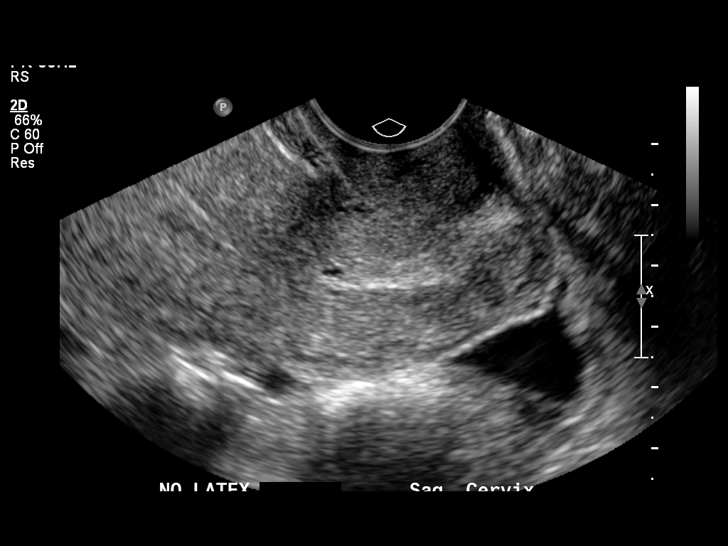
[im 3/29]
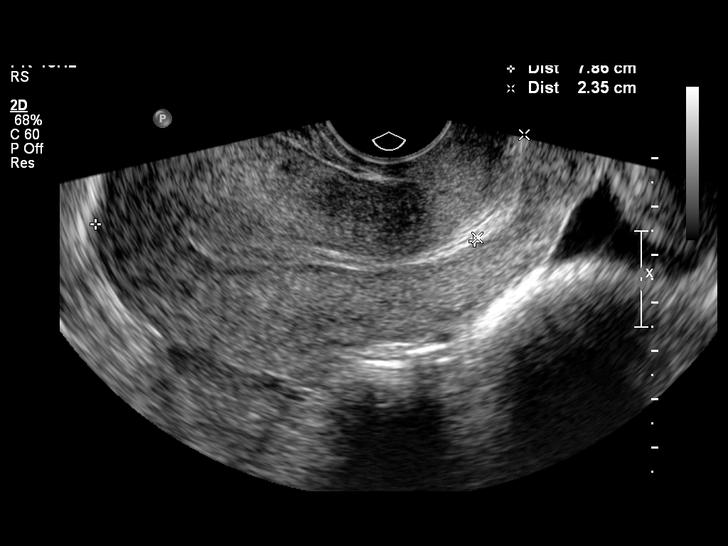
[im 5/29]
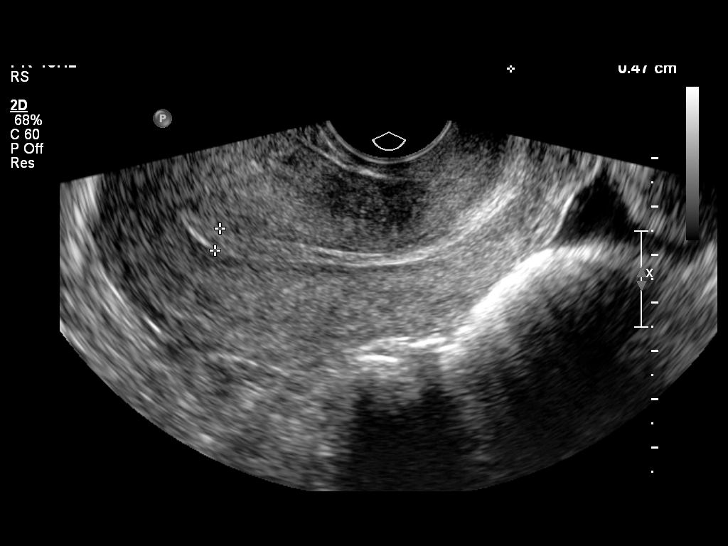
[im 8/29]
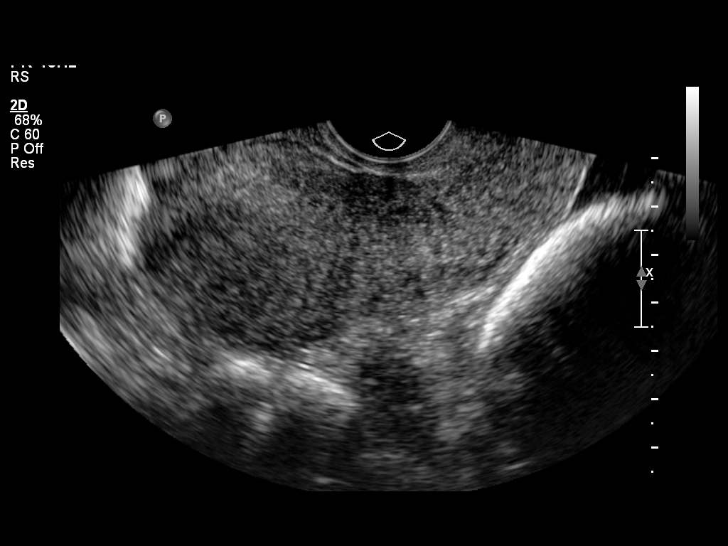
[im 10/29]
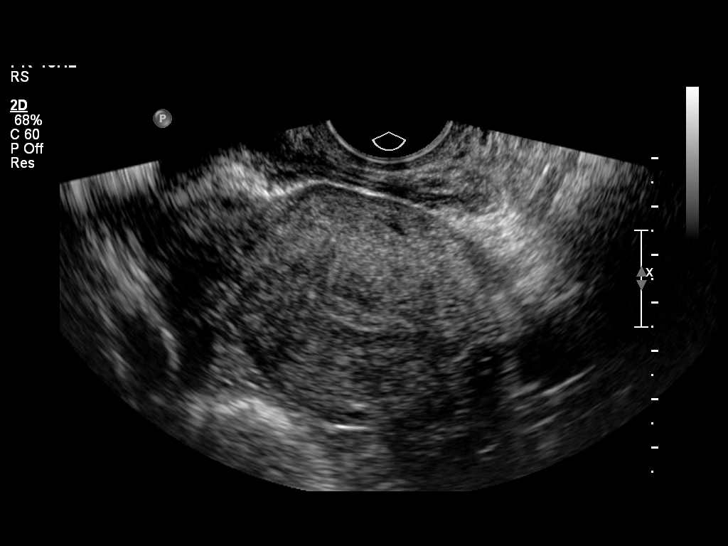
[im 11/29]
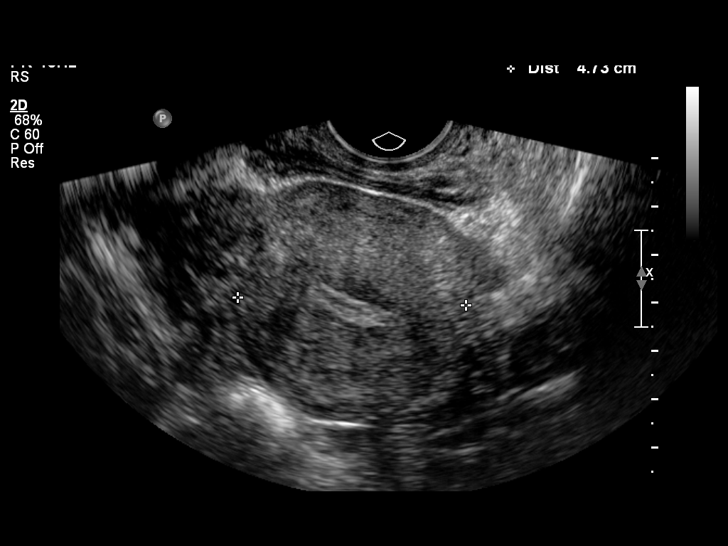
[im 13/29]
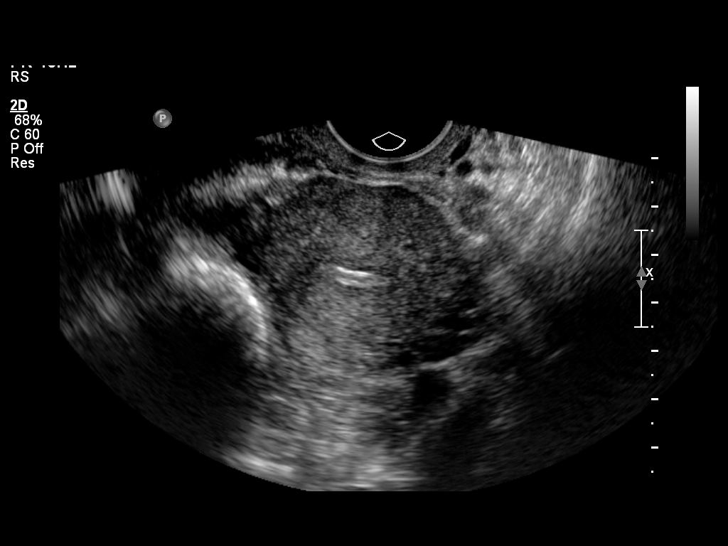
[im 16/29]
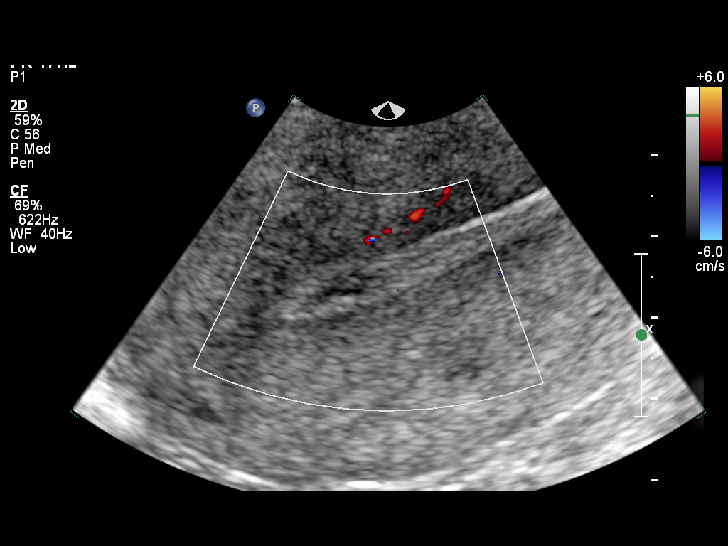
[im 18/29]
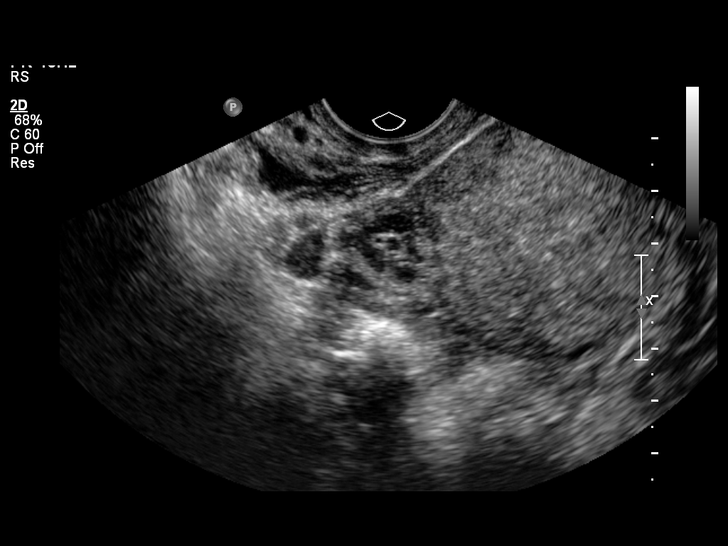
[im 19/29]
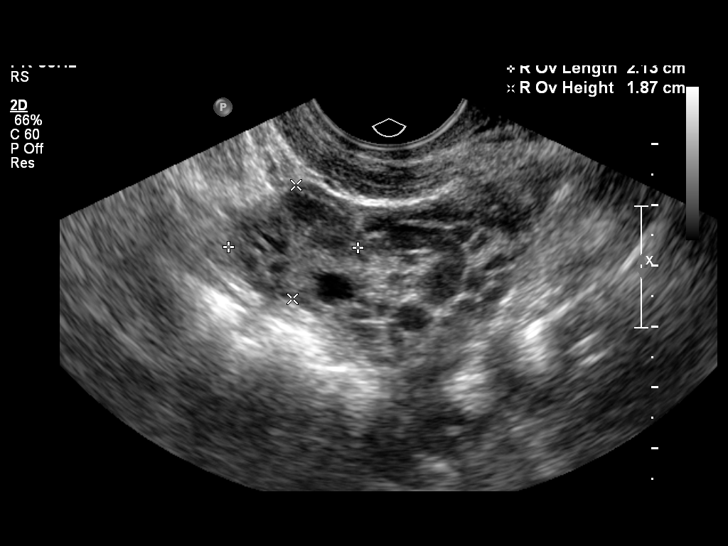
[im 22/29]
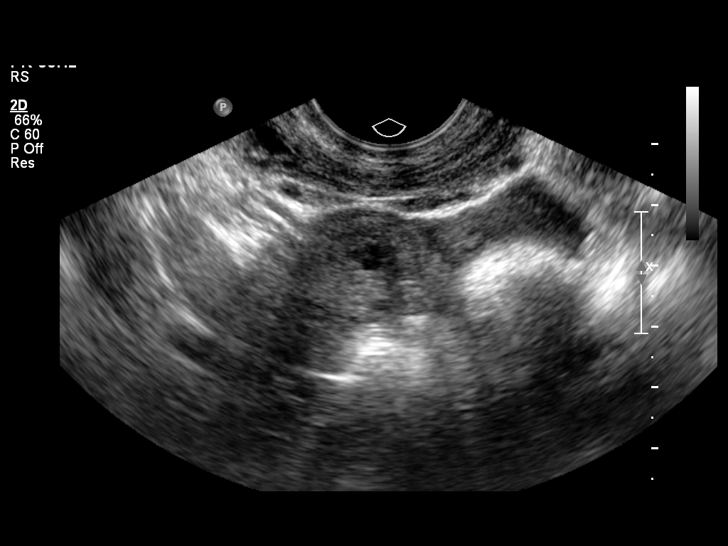
[im 24/29]
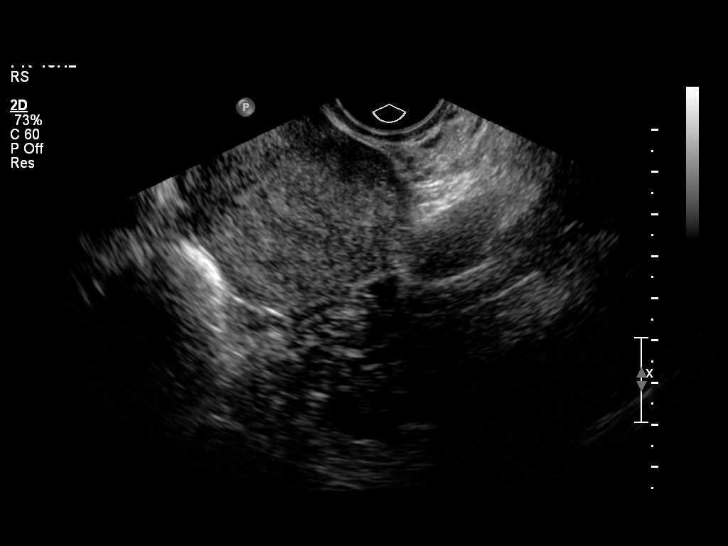
[im 26/29]
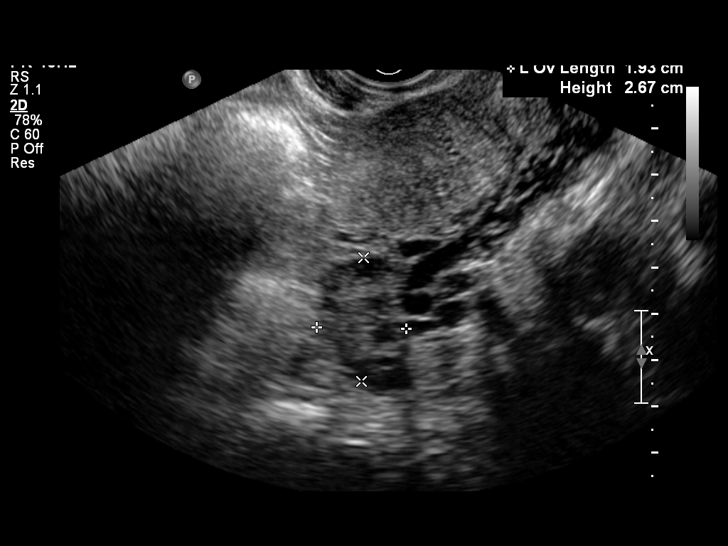
[im 29/29]
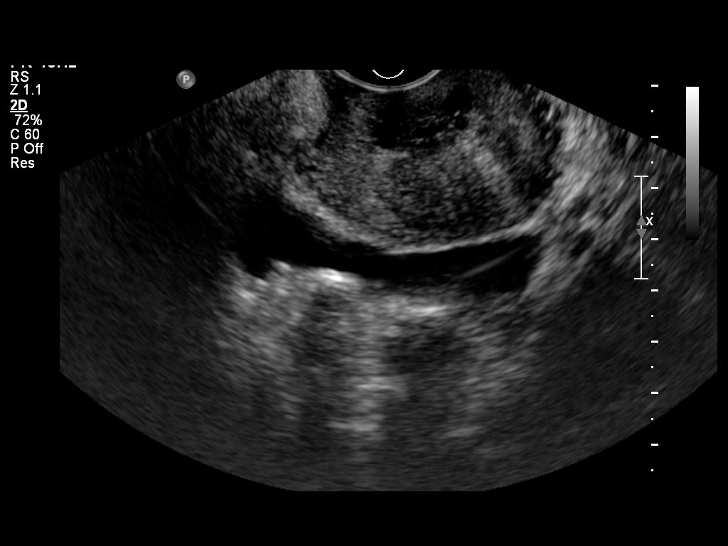

[14 of 25 positions shown; findings below may reference images not displayed]

FINDINGS: Uterus measures 10.2 x 4.7 x 4.7 cm.  No fibroids or other uterine
masses identified.

Endometrium measures 5 mm in thickness.  Within normal limits in
appearance.

Right Ovary measures 2.1 x 1.9 x 2.3 cm.  Normal appearance.

Left Ovary measures 1.9 x 2.7 x 2.4 cm.  Normal appearance.

Other Findings:  Trace free fluid seen and pelvic cul-de-sac.
IMPRESSION: 1.  Normal endometrial thickness.  No evidence of uterine fibroids.
2.  Normal ovaries.  No evidence of adnexal mass.

## 2010-08-26 LAB — COMPREHENSIVE METABOLIC PANEL
ALT: 63 U/L — ABNORMAL HIGH (ref 0–35)
AST: 64 U/L — ABNORMAL HIGH (ref 0–37)
Albumin: 2.8 g/dL — ABNORMAL LOW (ref 3.5–5.2)
Albumin: 3.2 g/dL — ABNORMAL LOW (ref 3.5–5.2)
Alkaline Phosphatase: 65 U/L (ref 39–117)
Alkaline Phosphatase: 74 U/L (ref 39–117)
BUN: 5 mg/dL — ABNORMAL LOW (ref 6–23)
BUN: 5 mg/dL — ABNORMAL LOW (ref 6–23)
CO2: 23 mEq/L (ref 19–32)
CO2: 28 mEq/L (ref 19–32)
Calcium: 8.5 mg/dL (ref 8.4–10.5)
Chloride: 102 mEq/L (ref 96–112)
Chloride: 102 mEq/L (ref 96–112)
Creatinine, Ser: 0.73 mg/dL (ref 0.4–1.2)
GFR calc Af Amer: >60 mL/min (ref >60)
GFR calc non Af Amer: >60 mL/min (ref >60)
GFR calc non Af Amer: >60 mL/min (ref >60)
Glucose, Bld: 105 mg/dL — ABNORMAL HIGH (ref 70–99)
Glucose, Bld: 80 mg/dL (ref 70–99)
Potassium: 3.7 mEq/L (ref 3.5–5.1)
Potassium: 4.2 mEq/L (ref 3.5–5.1)
Sodium: 139 mEq/L (ref 135–145)
Total Bilirubin: 0.5 mg/dL (ref 0.3–1.2)
Total Bilirubin: 0.8 mg/dL (ref 0.3–1.2)
Total Protein: 5.5 g/dL — ABNORMAL LOW (ref 6.0–8.3)

## 2010-08-26 LAB — LACTATE DEHYDROGENASE
LDH: 460 U/L — ABNORMAL HIGH (ref 94–250)
LDH: 501 U/L — ABNORMAL HIGH (ref 94–250)

## 2010-08-26 LAB — URINE CULTURE
Colony Count: NO GROWTH
Culture  Setup Time: 201110051707

## 2010-08-26 LAB — URINALYSIS, ROUTINE W REFLEX MICROSCOPIC
Bilirubin Urine: NEGATIVE
Glucose, UA: NEGATIVE mg/dL
Ketones, ur: NEGATIVE mg/dL
Nitrite: NEGATIVE
Protein, ur: 100 mg/dL — AB
Specific Gravity, Urine: 1.007 (ref 1.005–1.030)
Urobilinogen, UA: 0.2 mg/dL (ref 0.0–1.0)
pH: 6 (ref 5.0–8.0)

## 2010-08-26 LAB — DIFFERENTIAL
Band Neutrophils: 0 % (ref 0–10)
Basophils Absolute: 0 10*3/uL (ref 0.0–0.1)
Basophils Absolute: 0.1 10*3/uL (ref 0.0–0.1)
Basophils Relative: 0 % (ref 0–1)
Basophils Relative: 1 % (ref 0–1)
Eosinophils Absolute: 0.2 10*3/uL (ref 0.0–0.7)
Eosinophils Absolute: 0.2 10*3/uL (ref 0.0–0.7)
Eosinophils Relative: 2 % (ref 0–5)
Eosinophils Relative: 2 % (ref 0–5)
Lymphocytes Relative: 18 % (ref 12–46)
Lymphs Abs: 1.6 10*3/uL (ref 0.7–4.0)
Monocytes Absolute: 0.5 10*3/uL (ref 0.1–1.0)
Monocytes Relative: 6 % (ref 3–12)
Myelocytes: 0 %
Neutro Abs: 6.1 10*3/uL (ref 1.7–7.7)
Neutro Abs: 6.4 10*3/uL (ref 1.7–7.7)
Neutrophils Relative %: 73 % (ref 43–77)
Neutrophils Relative %: 75 % (ref 43–77)
Promyelocytes Absolute: 0 %

## 2010-08-26 LAB — CBC
HCT: 21.9 % — ABNORMAL LOW (ref 36.0–46.0)
HCT: 22 % — ABNORMAL LOW (ref 36.0–46.0)
HCT: 22.2 % — ABNORMAL LOW (ref 36.0–46.0)
HCT: 30.5 % — ABNORMAL LOW (ref 36.0–46.0)
HCT: 32.1 % — ABNORMAL LOW (ref 36.0–46.0)
HCT: 32.7 % — ABNORMAL LOW (ref 36.0–46.0)
Hemoglobin: 10.6 g/dL — ABNORMAL LOW (ref 12.0–15.0)
Hemoglobin: 10.8 g/dL — ABNORMAL LOW (ref 12.0–15.0)
Hemoglobin: 7.3 g/dL — ABNORMAL LOW (ref 12.0–15.0)
Hemoglobin: 7.4 g/dL — ABNORMAL LOW (ref 12.0–15.0)
Hemoglobin: 7.5 g/dL — ABNORMAL LOW (ref 12.0–15.0)
MCH: 27.3 pg (ref 26.0–34.0)
MCH: 27.7 pg (ref 26.0–34.0)
MCH: 27.9 pg (ref 26.0–34.0)
MCH: 27.9 pg (ref 26.0–34.0)
MCHC: 32.9 g/dL (ref 30.0–36.0)
MCHC: 33.5 g/dL (ref 30.0–36.0)
MCHC: 33.6 g/dL (ref 30.0–36.0)
MCHC: 33.7 g/dL (ref 30.0–36.0)
MCHC: 34.1 g/dL (ref 30.0–36.0)
MCV: 80 fL (ref 78.0–100.0)
MCV: 82.8 fL (ref 78.0–100.0)
MCV: 82.8 fL (ref 78.0–100.0)
MCV: 83 fL (ref 78.0–100.0)
MCV: 84.1 fL (ref 78.0–100.0)
Platelets: 170 10*3/uL (ref 150–400)
Platelets: 216 10*3/uL (ref 150–400)
Platelets: 225 10*3/uL (ref 150–400)
Platelets: 269 10*3/uL (ref 150–400)
RBC: 2.64 MIL/uL — ABNORMAL LOW (ref 3.87–5.11)
RBC: 2.64 MIL/uL — ABNORMAL LOW (ref 3.87–5.11)
RBC: 2.75 MIL/uL — ABNORMAL LOW (ref 3.87–5.11)
RBC: 3.87 MIL/uL (ref 3.87–5.11)
RBC: 3.94 MIL/uL (ref 3.87–5.11)
RBC: 3.94 MIL/uL (ref 3.87–5.11)
RDW: 14.3 % (ref 11.5–15.5)
RDW: 14.4 % (ref 11.5–15.5)
RDW: 14.5 % (ref 11.5–15.5)
WBC: 13.4 10*3/uL — ABNORMAL HIGH (ref 4.0–10.5)
WBC: 8.4 10*3/uL (ref 4.0–10.5)
WBC: 8.4 10*3/uL (ref 4.0–10.5)
WBC: 8.8 10*3/uL (ref 4.0–10.5)

## 2010-08-26 LAB — HEPATIC FUNCTION PANEL
Albumin: 2.7 g/dL — ABNORMAL LOW (ref 3.5–5.2)
Alkaline Phosphatase: 85 U/L (ref 39–117)
Indirect Bilirubin: 0.3 mg/dL (ref 0.3–0.9)
Total Bilirubin: 0.4 mg/dL (ref 0.3–1.2)
Total Protein: 5.8 g/dL — ABNORMAL LOW (ref 6.0–8.3)

## 2010-08-26 LAB — MRSA PCR SCREENING: MRSA by PCR: NEGATIVE

## 2010-08-26 LAB — URIC ACID
Uric Acid, Serum: 4.7 mg/dL (ref 2.4–7.0)
Uric Acid, Serum: 5.5 mg/dL (ref 2.4–7.0)

## 2010-08-26 LAB — CCBB MATERNAL DONOR DRAW

## 2010-08-26 LAB — RPR: RPR Ser Ql: NONREACTIVE

## 2010-08-26 LAB — BRAIN NATRIURETIC PEPTIDE: Pro B Natriuretic peptide (BNP): 335 pg/mL — ABNORMAL HIGH (ref 0.0–100.0)

## 2010-09-09 ENCOUNTER — Emergency Department (HOSPITAL_COMMUNITY)
Admission: EM | Admit: 2010-09-09 | Discharge: 2010-09-09 | Disposition: A | Discharge status: Home / Self Care | Payer: Medicaid Other | Attending: Emergency Medicine | Admitting: Emergency Medicine

## 2010-09-09 DIAGNOSIS — J069 Acute upper respiratory infection, unspecified: Secondary | ICD-10-CM | POA: Insufficient documentation

## 2010-09-09 DIAGNOSIS — I10 Essential (primary) hypertension: Secondary | ICD-10-CM | POA: Insufficient documentation

## 2010-09-09 DIAGNOSIS — R0982 Postnasal drip: Secondary | ICD-10-CM | POA: Insufficient documentation

## 2010-09-09 DIAGNOSIS — R070 Pain in throat: Secondary | ICD-10-CM | POA: Insufficient documentation

## 2010-09-09 DIAGNOSIS — J3489 Other specified disorders of nose and nasal sinuses: Secondary | ICD-10-CM | POA: Insufficient documentation

## 2010-09-09 DIAGNOSIS — B9789 Other viral agents as the cause of diseases classified elsewhere: Secondary | ICD-10-CM | POA: Insufficient documentation

## 2010-09-09 DIAGNOSIS — Z79899 Other long term (current) drug therapy: Secondary | ICD-10-CM | POA: Insufficient documentation

## 2010-09-13 LAB — URINALYSIS, ROUTINE W REFLEX MICROSCOPIC
Glucose, UA: NEGATIVE mg/dL
Ketones, ur: 15 mg/dL — AB
Leukocytes, UA: NEGATIVE
Protein, ur: NEGATIVE mg/dL

## 2010-09-13 LAB — GC/CHLAMYDIA PROBE AMP, GENITAL
Chlamydia, DNA Probe: NEGATIVE
GC Probe Amp, Genital: NEGATIVE

## 2010-09-13 LAB — CBC
HCT: 32.8 % — ABNORMAL LOW (ref 36.0–46.0)
MCV: 81 fL (ref 78.0–100.0)
Platelets: 267 10*3/uL (ref 150–400)
RDW: 14.2 % (ref 11.5–15.5)

## 2010-09-13 LAB — WET PREP, GENITAL
Clue Cells Wet Prep HPF POC: NONE SEEN
Trich, Wet Prep: NONE SEEN

## 2010-09-14 ENCOUNTER — Emergency Department (HOSPITAL_COMMUNITY)
Admission: EM | Admit: 2010-09-14 | Discharge: 2010-09-14 | Disposition: A | Discharge status: Home / Self Care | Payer: Medicaid Other | Attending: Emergency Medicine | Admitting: Emergency Medicine

## 2010-09-14 DIAGNOSIS — R05 Cough: Secondary | ICD-10-CM | POA: Insufficient documentation

## 2010-09-14 DIAGNOSIS — J3489 Other specified disorders of nose and nasal sinuses: Secondary | ICD-10-CM | POA: Insufficient documentation

## 2010-09-14 DIAGNOSIS — Z79899 Other long term (current) drug therapy: Secondary | ICD-10-CM | POA: Insufficient documentation

## 2010-09-14 DIAGNOSIS — H9209 Otalgia, unspecified ear: Secondary | ICD-10-CM | POA: Insufficient documentation

## 2010-09-14 DIAGNOSIS — J069 Acute upper respiratory infection, unspecified: Secondary | ICD-10-CM | POA: Insufficient documentation

## 2010-09-14 DIAGNOSIS — R059 Cough, unspecified: Secondary | ICD-10-CM | POA: Insufficient documentation

## 2010-09-14 DIAGNOSIS — I10 Essential (primary) hypertension: Secondary | ICD-10-CM | POA: Insufficient documentation

## 2010-09-14 DIAGNOSIS — J029 Acute pharyngitis, unspecified: Secondary | ICD-10-CM | POA: Insufficient documentation

## 2010-09-17 LAB — POCT PREGNANCY, URINE: Preg Test, Ur: NEGATIVE

## 2010-09-17 LAB — CBC
HCT: 37 % (ref 36.0–46.0)
MCHC: 33.4 g/dL (ref 30.0–36.0)
MCV: 83.1 fL (ref 78.0–100.0)
Platelets: 262 10*3/uL (ref 150–400)
RDW: 15.9 % — ABNORMAL HIGH (ref 11.5–15.5)
RDW: 16.3 % — ABNORMAL HIGH (ref 11.5–15.5)
WBC: 8.8 10*3/uL (ref 4.0–10.5)

## 2010-09-17 LAB — URINE CULTURE

## 2010-09-17 LAB — BASIC METABOLIC PANEL
GFR calc Af Amer: >60 mL/min (ref >60)
GFR calc non Af Amer: >60 mL/min (ref >60)
Glucose, Bld: 92 mg/dL (ref 70–99)
Potassium: 3.5 mEq/L (ref 3.5–5.1)
Sodium: 142 mEq/L (ref 135–145)

## 2010-09-17 LAB — URINE MICROSCOPIC-ADD ON

## 2010-09-17 LAB — DIFFERENTIAL
Basophils Absolute: 0.1 10*3/uL (ref 0.0–0.1)
Basophils Relative: 1 % (ref 0–1)
Eosinophils Absolute: 0.2 10*3/uL (ref 0.0–0.7)
Eosinophils Relative: 2 % (ref 0–5)
Lymphs Abs: 2.3 10*3/uL (ref 0.7–4.0)
Neutrophils Relative %: 65 % (ref 43–77)

## 2010-09-17 LAB — URINALYSIS, ROUTINE W REFLEX MICROSCOPIC
Bilirubin Urine: NEGATIVE
Nitrite: NEGATIVE
Specific Gravity, Urine: 1.028 (ref 1.005–1.030)
pH: 7.5 (ref 5.0–8.0)

## 2010-09-17 LAB — WET PREP, GENITAL
Trich, Wet Prep: NONE SEEN
Yeast Wet Prep HPF POC: NONE SEEN

## 2010-09-17 LAB — GC/CHLAMYDIA PROBE AMP, GENITAL: Chlamydia, DNA Probe: NEGATIVE

## 2010-09-17 LAB — HCG, SERUM, QUALITATIVE: Preg, Serum: NEGATIVE

## 2010-09-21 LAB — URINALYSIS, ROUTINE W REFLEX MICROSCOPIC
Bilirubin Urine: NEGATIVE
Hgb urine dipstick: NEGATIVE
Ketones, ur: NEGATIVE mg/dL
Nitrite: NEGATIVE
Protein, ur: NEGATIVE mg/dL
Specific Gravity, Urine: 1.015 (ref 1.005–1.030)
Urobilinogen, UA: 0.2 mg/dL (ref 0.0–1.0)

## 2010-09-21 LAB — CBC
HCT: 27.5 % — ABNORMAL LOW (ref 36.0–46.0)
MCHC: 32.8 g/dL (ref 30.0–36.0)
MCV: 75 fL — ABNORMAL LOW (ref 78.0–100.0)
Platelets: 286 10*3/uL (ref 150–400)
WBC: 6.4 10*3/uL (ref 4.0–10.5)

## 2010-09-21 LAB — DIFFERENTIAL
Basophils Absolute: 0 10*3/uL (ref 0.0–0.1)
Lymphocytes Relative: 35 % (ref 12–46)
Lymphs Abs: 2.2 10*3/uL (ref 0.7–4.0)
Monocytes Absolute: 0.5 10*3/uL (ref 0.1–1.0)
Neutro Abs: 3.5 10*3/uL (ref 1.7–7.7)

## 2010-09-21 LAB — D-DIMER, QUANTITATIVE: D-Dimer, Quant: 2.66 ug/mL-FEU — ABNORMAL HIGH (ref 0.00–0.48)

## 2010-09-21 LAB — COMPREHENSIVE METABOLIC PANEL
Albumin: 3.5 g/dL (ref 3.5–5.2)
BUN: 9 mg/dL (ref 6–23)
Calcium: 8.8 mg/dL (ref 8.4–10.5)
Chloride: 107 mEq/L (ref 96–112)
Creatinine, Ser: 0.65 mg/dL (ref 0.4–1.2)
GFR calc Af Amer: >60 mL/min (ref >60)
GFR calc non Af Amer: >60 mL/min (ref >60)
Total Bilirubin: 0.5 mg/dL (ref 0.3–1.2)

## 2010-09-21 LAB — HEMOCCULT GUIAC POC 1CARD (OFFICE): Fecal Occult Bld: NEGATIVE

## 2010-09-23 LAB — POCT I-STAT, CHEM 8
BUN: 13 mg/dL (ref 6–23)
Calcium, Ion: 1.19 mmol/L (ref 1.12–1.32)
Chloride: 106 mEq/L (ref 96–112)
Creatinine, Ser: 0.7 mg/dL (ref 0.4–1.2)
Glucose, Bld: 88 mg/dL (ref 70–99)
HCT: 39 % (ref 36.0–46.0)
Hemoglobin: 13.3 g/dL (ref 12.0–15.0)
Potassium: 4.1 mEq/L (ref 3.5–5.1)
Sodium: 140 mEq/L (ref 135–145)
TCO2: 27 mmol/L (ref 0–100)

## 2010-09-23 LAB — GC/CHLAMYDIA PROBE AMP, GENITAL
Chlamydia, DNA Probe: NEGATIVE
GC Probe Amp, Genital: NEGATIVE

## 2010-09-23 LAB — WET PREP, GENITAL
Trich, Wet Prep: NONE SEEN
Yeast Wet Prep HPF POC: NONE SEEN

## 2010-09-23 LAB — HCG, QUANTITATIVE, PREGNANCY: hCG, Beta Chain, Quant, S: <2 m[IU]/mL (ref <5)

## 2010-09-23 LAB — POCT PREGNANCY, URINE: Preg Test, Ur: NEGATIVE

## 2010-09-23 LAB — DIFFERENTIAL
Basophils Absolute: 0 10*3/uL (ref 0.0–0.1)
Lymphocytes Relative: 38 % (ref 12–46)
Neutro Abs: 2.7 10*3/uL (ref 1.7–7.7)

## 2010-09-23 LAB — CBC
Platelets: 298 10*3/uL (ref 150–400)
RDW: 16.9 % — ABNORMAL HIGH (ref 11.5–15.5)
WBC: 5.6 10*3/uL (ref 4.0–10.5)

## 2010-09-23 LAB — URINALYSIS, ROUTINE W REFLEX MICROSCOPIC
Glucose, UA: NEGATIVE mg/dL
Hgb urine dipstick: NEGATIVE
Protein, ur: NEGATIVE mg/dL

## 2010-09-27 LAB — CBC
HCT: 33.5 % — ABNORMAL LOW (ref 36.0–46.0)
MCV: 83.7 fL (ref 78.0–100.0)
Platelets: 241 10*3/uL (ref 150–400)
WBC: 9.9 10*3/uL (ref 4.0–10.5)

## 2010-10-30 IMAGING — US US OB COMP LESS 14 WK
1 series · 13 of 28 positions shown · non-contrast
Comparison: Pelvic ultrasound performed 02/20/2009

CLINICAL DATA: Vaginal bleeding.

OBSTETRIC <14 WK US AND TRANSVAGINAL OB US
TECHNIQUE: Both transabdominal and transvaginal ultrasound
examinations were performed for complete evaluation of the
gestation as well as the maternal uterus, adnexal regions, and
pelvic cul-de-sac.

[Series 1: us ob comp less 14 wks · 0.24mm/px · 39 acquisitions, 13 frames shown]
[im 2/39]
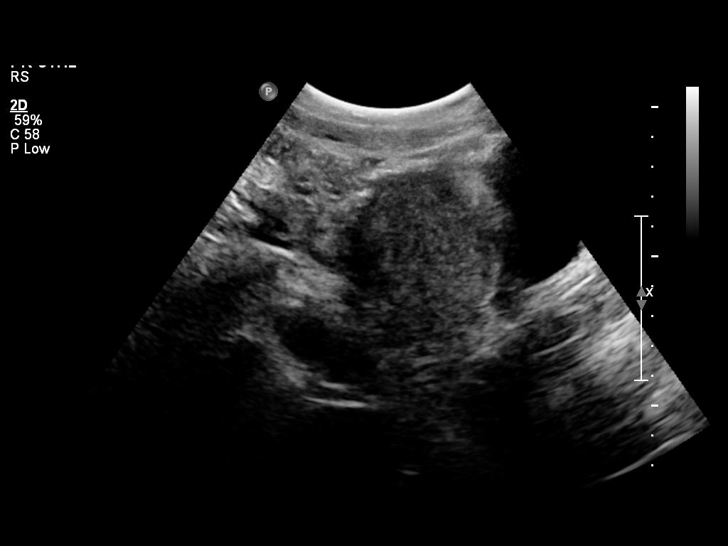
[im 5/39]
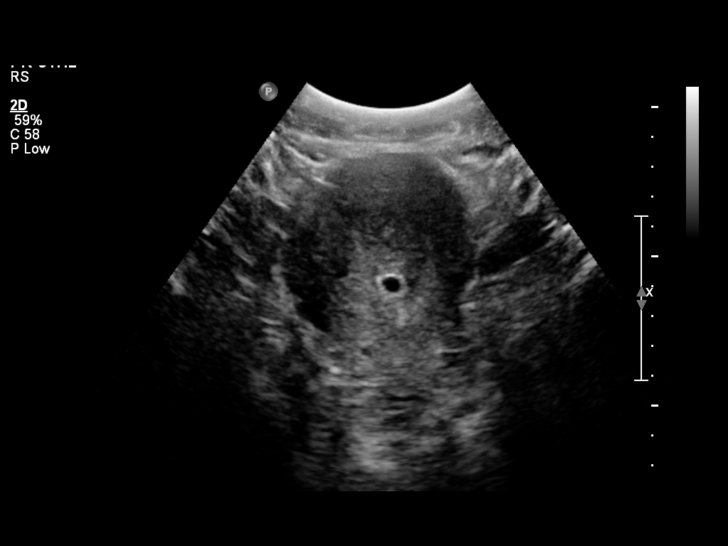
[im 8/39]
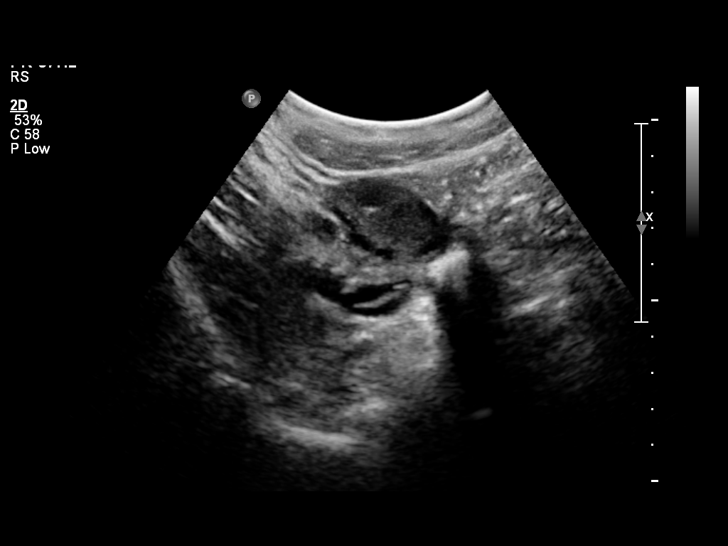
[im 10/39]
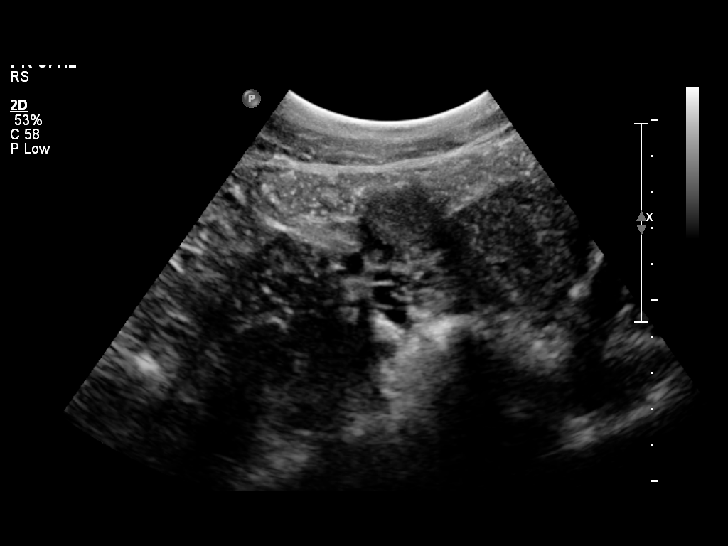
[im 13/39]
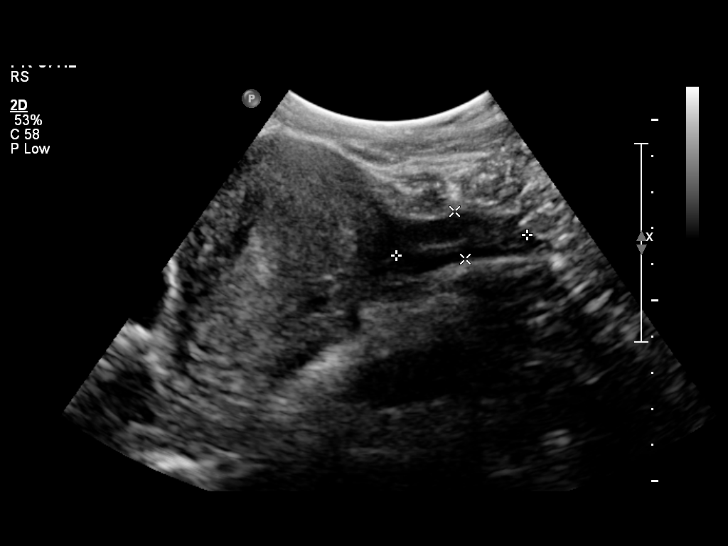
[im 16/39]
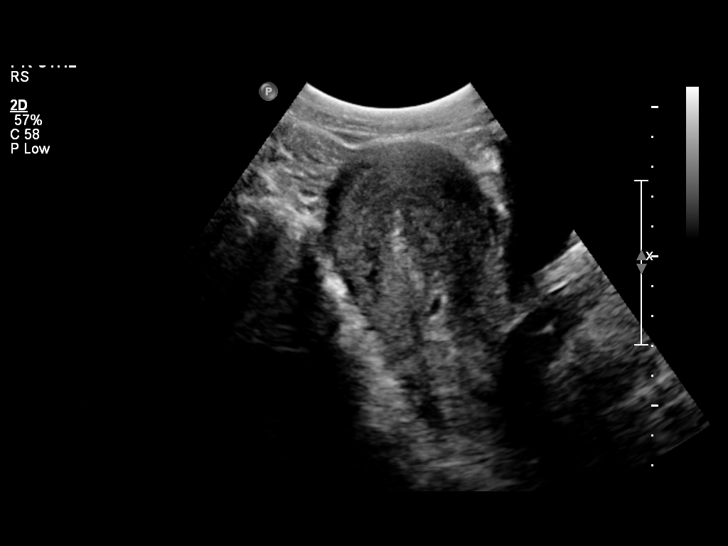
[im 20/39]
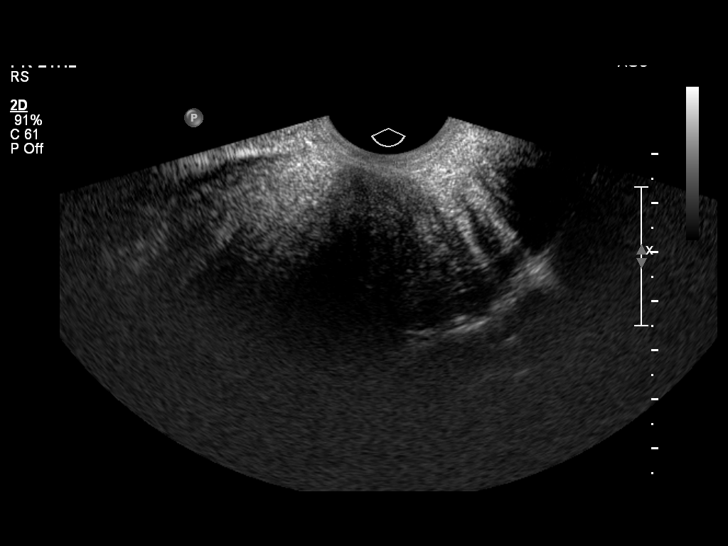
[im 23/39]
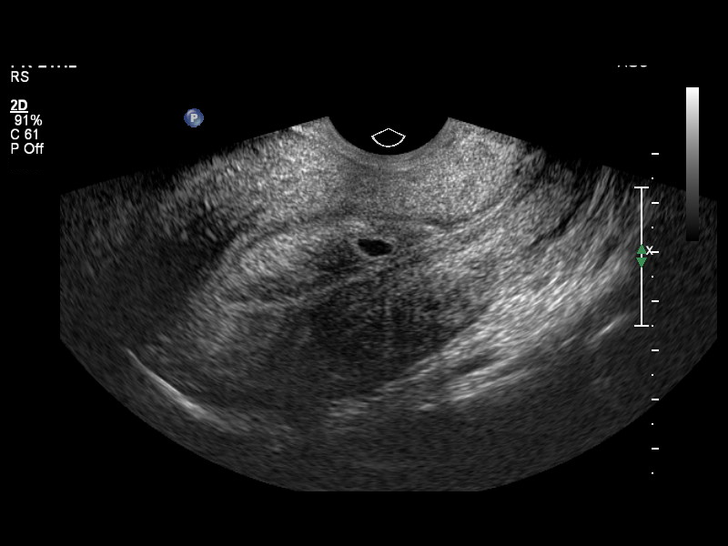
[im 26/39]
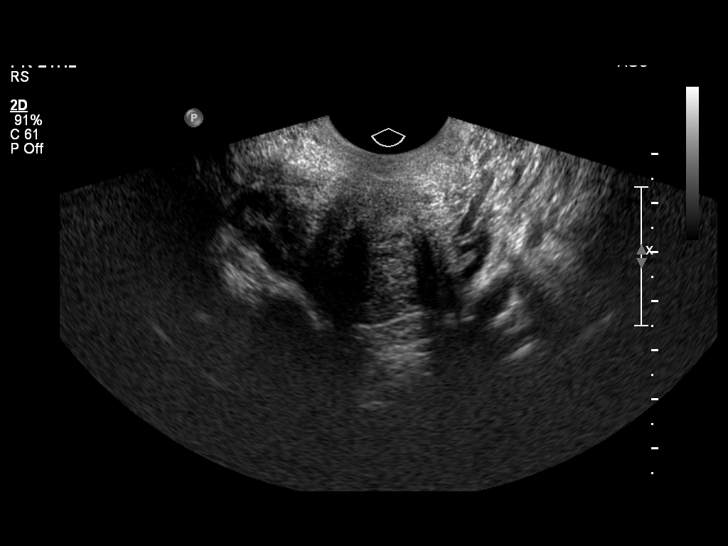
[im 29/39]
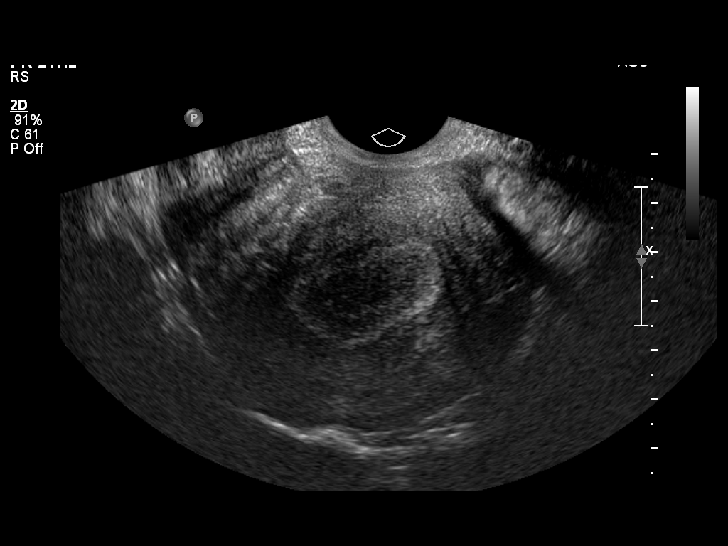
[im 31/39]
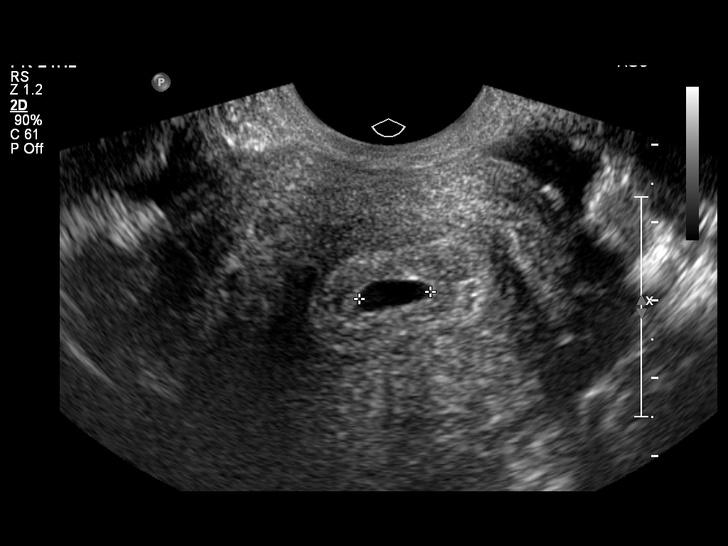
[im 34/39]
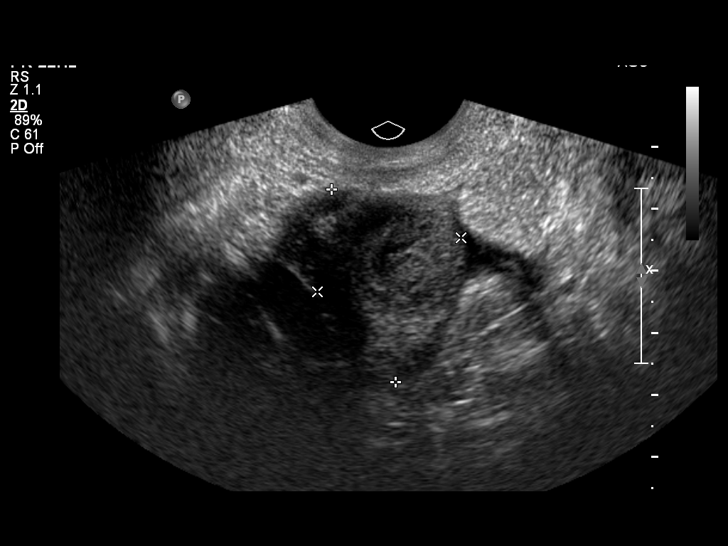
[im 37/39]
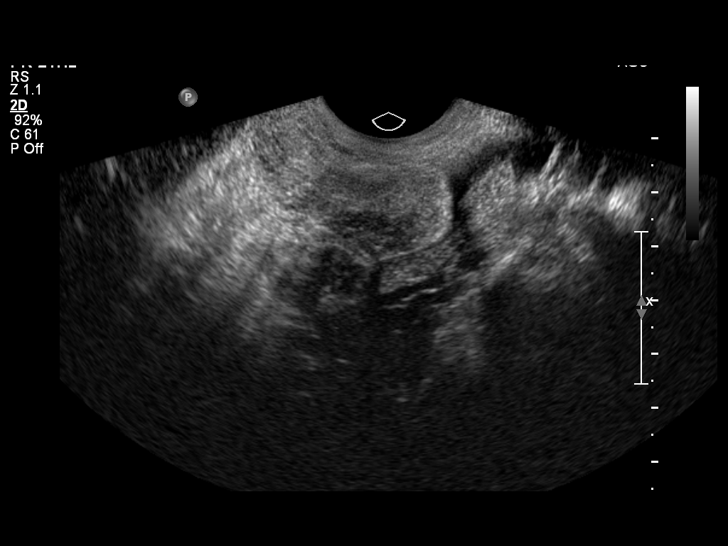

[13 of 28 positions shown; findings below may reference images not displayed]

FINDINGS: A single intrauterine gestational sac is identified; mean sac
diameter measures 0.71 cm. This corresponds to a gestational age of
5 weeks 2 days.  On cine images, note is made of fluid contents
within the endometrial canal, and the intrauterine gestational sac
appears to be fully mobile within this fluid.  This raises
suspicion for imminent abortion.

No yolk sac or embryo is yet visualized.  Uterine echogenicity is
within normal limits.  No fibroids are identified.

The ovaries are unremarkable in appearance.  The right ovary
measures 3.3 x 2.5 x 2.4 cm, while the left ovary measures 3.6 x
1.3 x 1.6 cm. No suspicious ovarian masses are seen.

No free fluid is seen within the pelvic cul-de-sac.
IMPRESSION: Single intrauterine gestational sac noted, with an estimated
gestational age of 5 weeks 2 days.  The gestational sac appears to
be fully mobile within the fluid in the endometrial canal; this
raises suspicion for imminent abortion.

## 2011-02-15 ENCOUNTER — Emergency Department (HOSPITAL_COMMUNITY)
Admission: EM | Admit: 2011-02-15 | Discharge: 2011-02-15 | Disposition: A | Discharge status: Home / Self Care | Payer: Medicaid Other | Attending: Emergency Medicine | Admitting: Emergency Medicine

## 2011-02-15 DIAGNOSIS — I10 Essential (primary) hypertension: Secondary | ICD-10-CM | POA: Insufficient documentation

## 2011-02-15 DIAGNOSIS — M79609 Pain in unspecified limb: Secondary | ICD-10-CM | POA: Insufficient documentation

## 2011-02-15 DIAGNOSIS — M65849 Other synovitis and tenosynovitis, unspecified hand: Secondary | ICD-10-CM | POA: Insufficient documentation

## 2011-02-15 DIAGNOSIS — W2203XA Walked into furniture, initial encounter: Secondary | ICD-10-CM | POA: Insufficient documentation

## 2011-02-15 DIAGNOSIS — M7989 Other specified soft tissue disorders: Secondary | ICD-10-CM | POA: Insufficient documentation

## 2011-02-15 DIAGNOSIS — S90129A Contusion of unspecified lesser toe(s) without damage to nail, initial encounter: Secondary | ICD-10-CM | POA: Insufficient documentation

## 2011-02-15 DIAGNOSIS — M65839 Other synovitis and tenosynovitis, unspecified forearm: Secondary | ICD-10-CM | POA: Insufficient documentation

## 2011-03-07 IMAGING — US US OB DETAIL+14 WK
2 series · 14 of 28 positions shown · non-contrast
Comparison: none

OBSTETRICAL ULTRASOUND:
 This ultrasound exam was performed in the [HOSPITAL] Ultrasound Department.  The OB US report was generated in the AS system, and faxed to the ordering physician.  This report is also available in [HOSPITAL]?s AccessANYware and in [REDACTED] PACS.

[Series 1: us ob detail +14 wk · 0.17mm/px · 13 of 55 slices shown (1 of 2)]
[im 3/55]
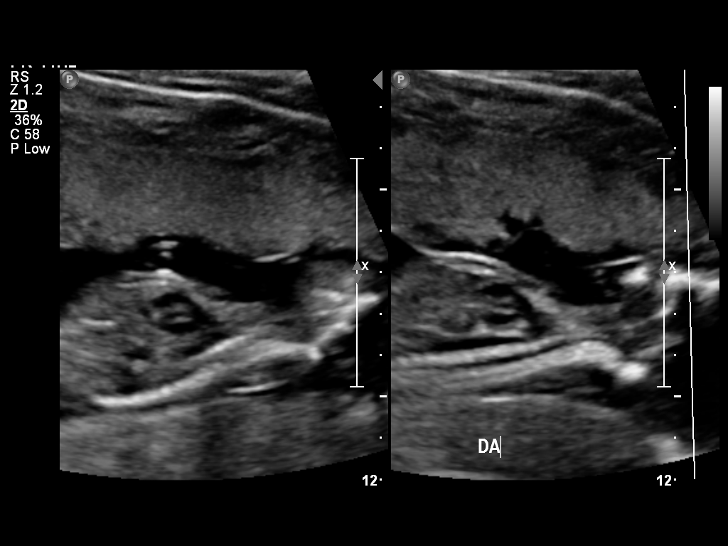
[im 7/55]
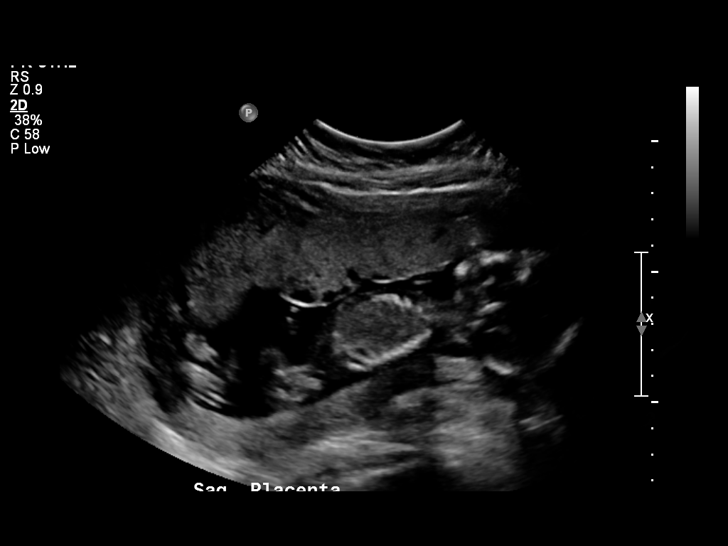
[im 11/55]
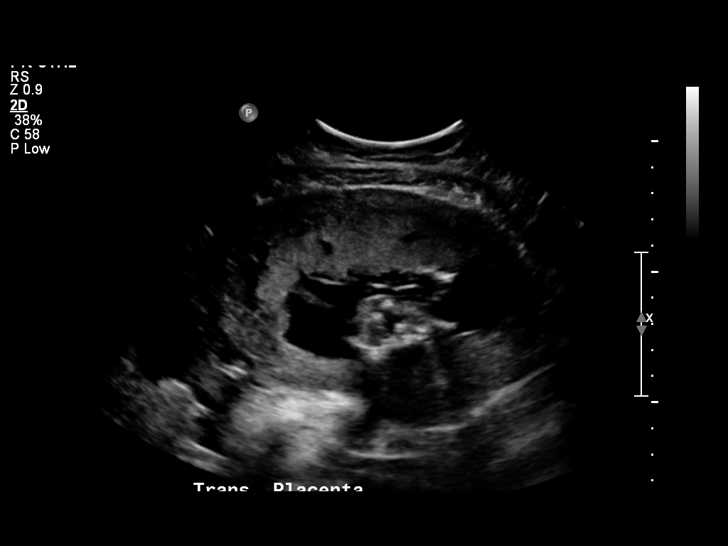
[im 15/55]
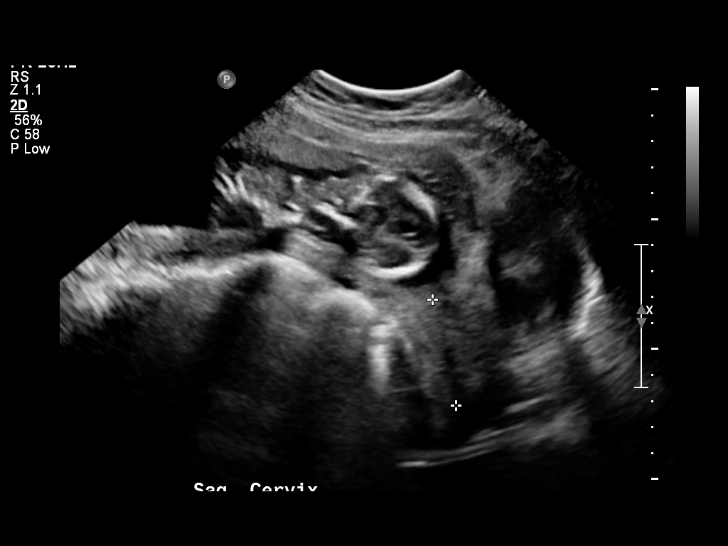
[im 19/55]
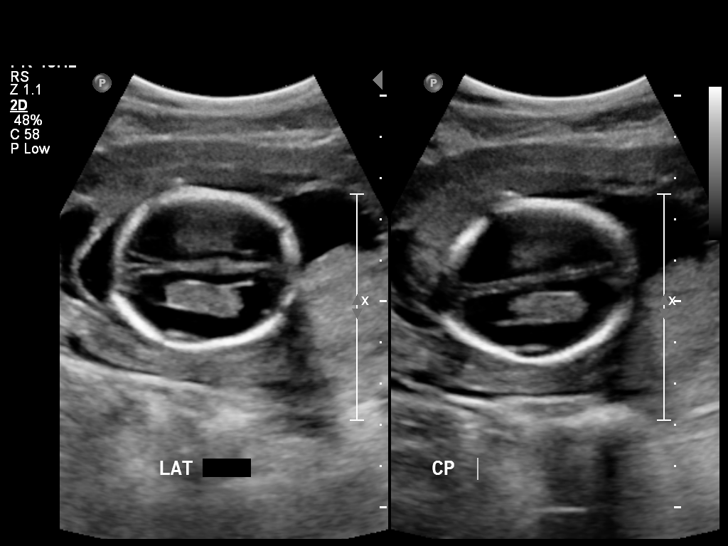
[im 23/55]
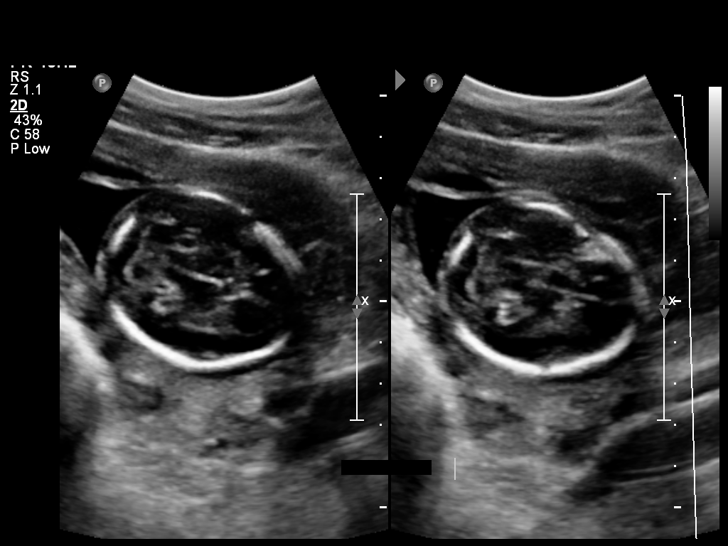
[im 28/55]
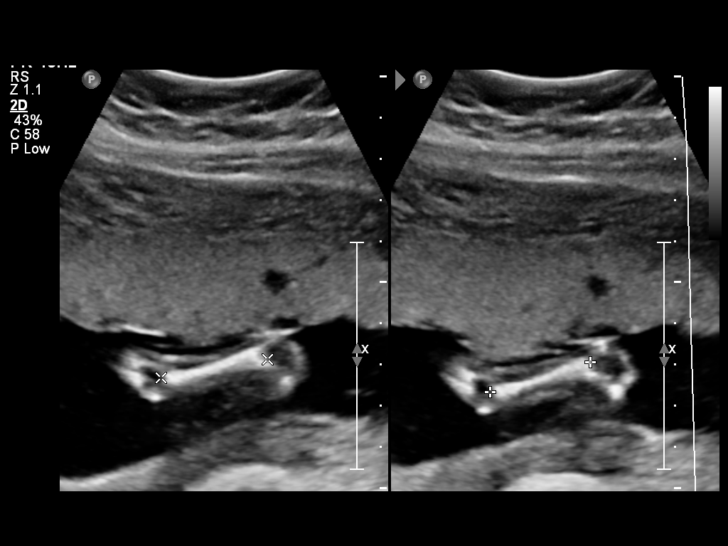
[im 32/55]
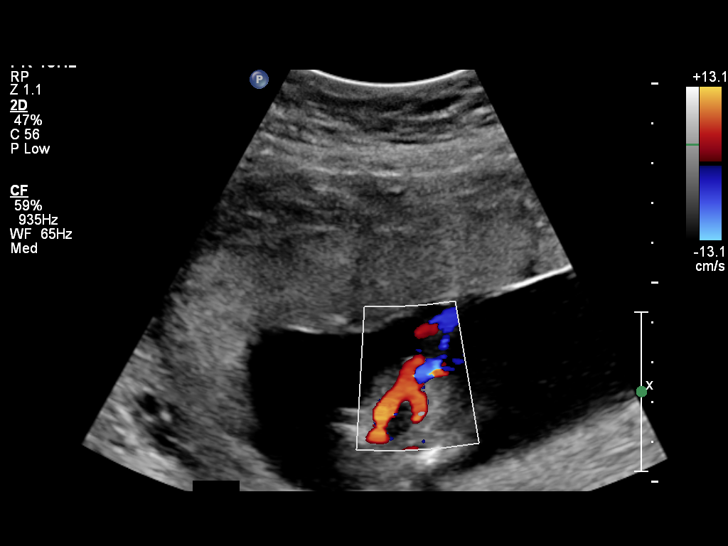
[im 36/55]
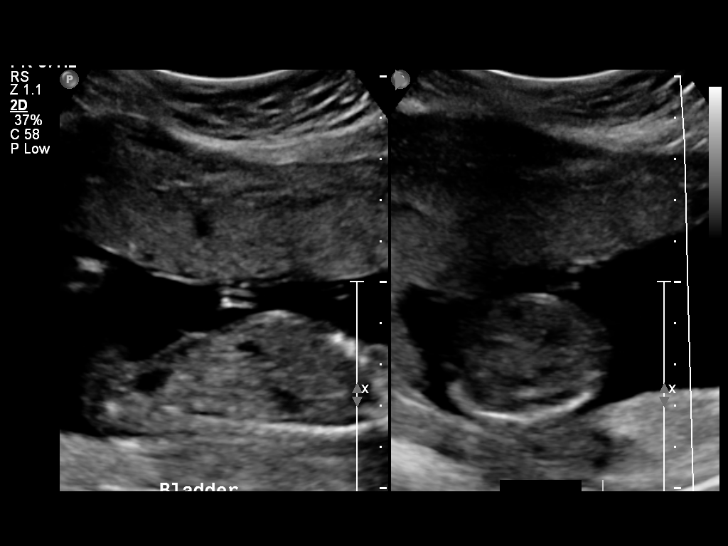
[im 40/55]
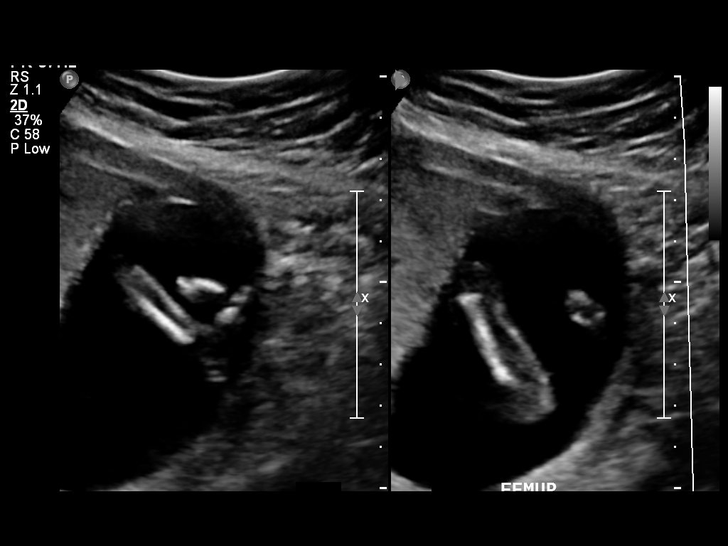
[im 44/55]
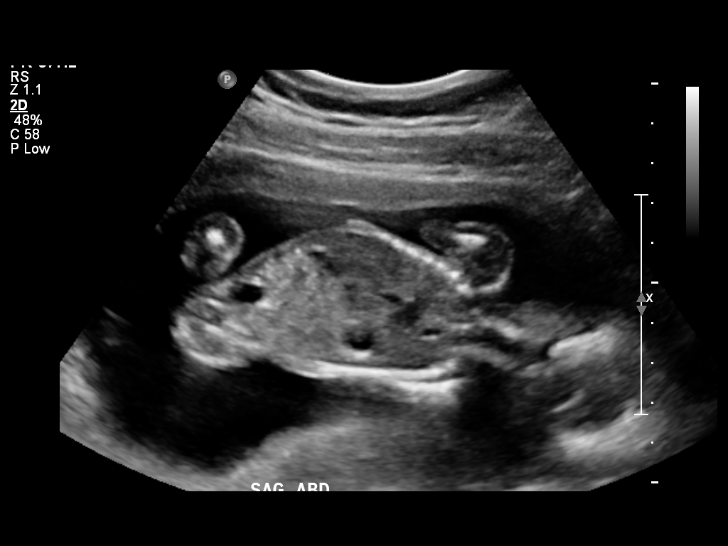
[im 48/55]
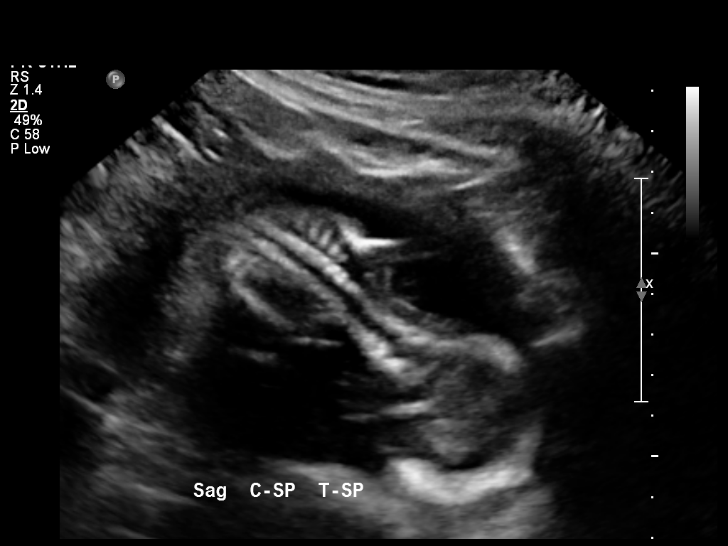
[im 52/55]
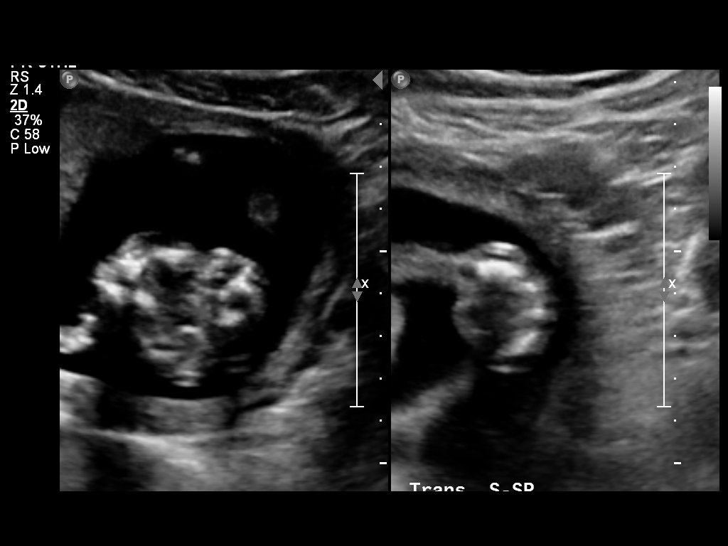

[Series 1: us ob detail +14 wk · 1 of 1 slices shown (2 of 2)]
[im 1/1]
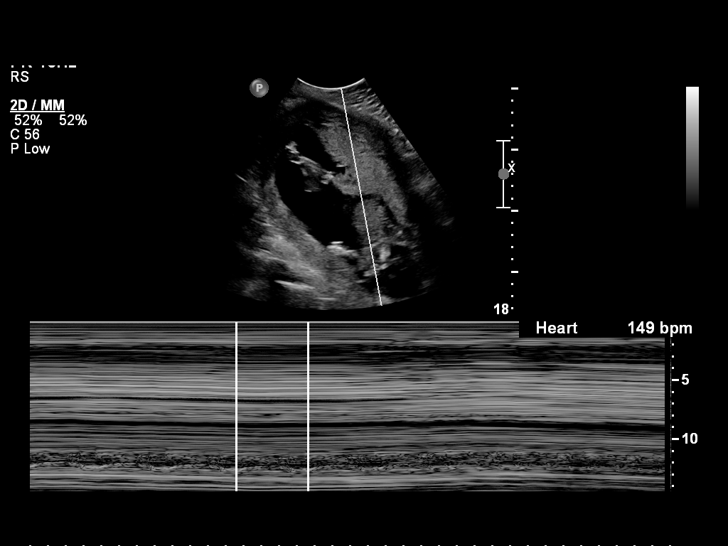

[14 of 28 positions shown; findings below may reference images not displayed]

IMPRESSION: See AS Obstetric US report.

## 2011-03-20 ENCOUNTER — Other Ambulatory Visit: Payer: Self-pay | Admitting: Obstetrics

## 2011-05-25 IMAGING — US US OB FOLLOW-UP
1 series · 14 of 28 positions shown · non-contrast
Comparison: none

OBSTETRICAL ULTRASOUND:
 This ultrasound exam was performed in the [HOSPITAL] Ultrasound Department.  The OB US report was generated in the AS system, and faxed to the ordering physician.  This report is also available in [HOSPITAL]?s Access[employer] and in [REDACTED] PACS.

[Series 1: us ob follow up · 14 of 33 slices shown]
[im 2/33]
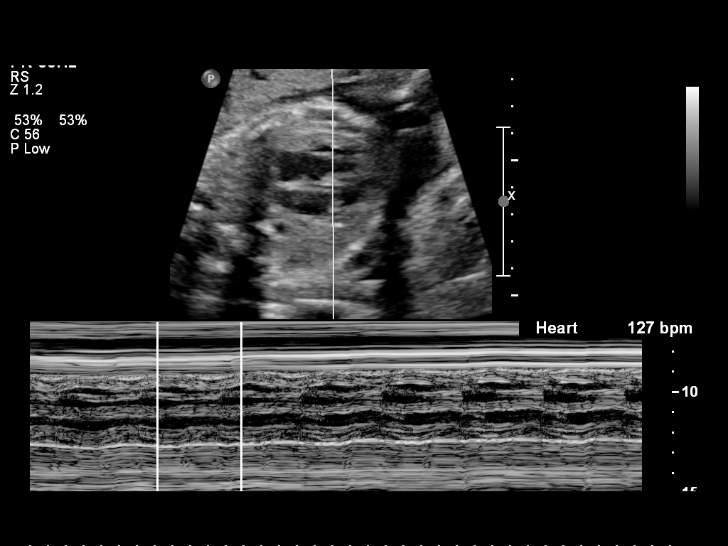
[im 4/33]
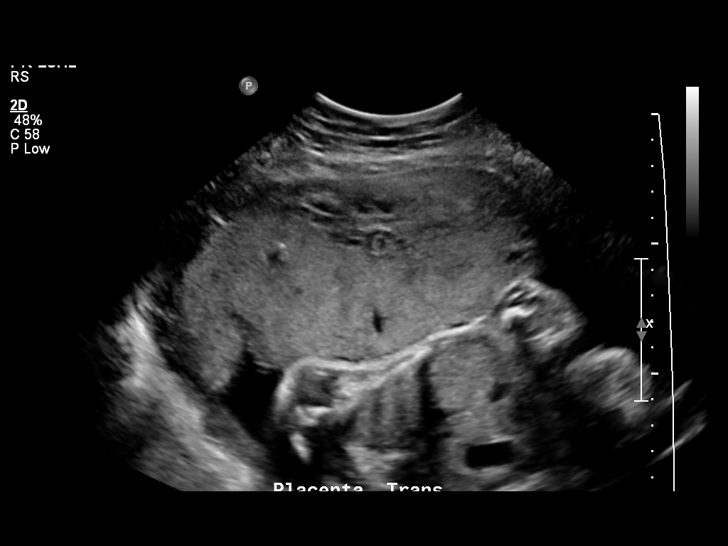
[im 6/33]
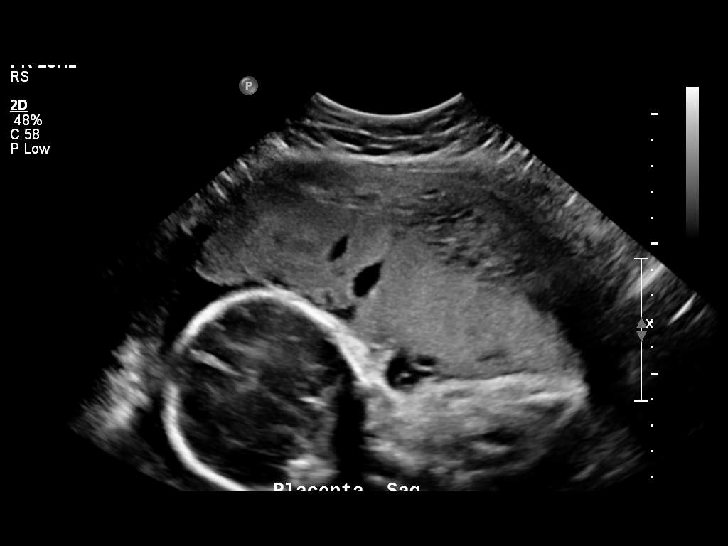
[im 9/33]
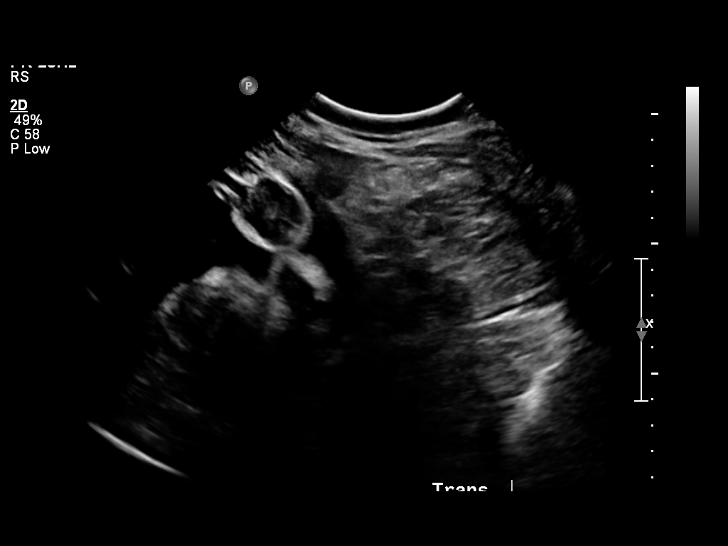
[im 11/33]
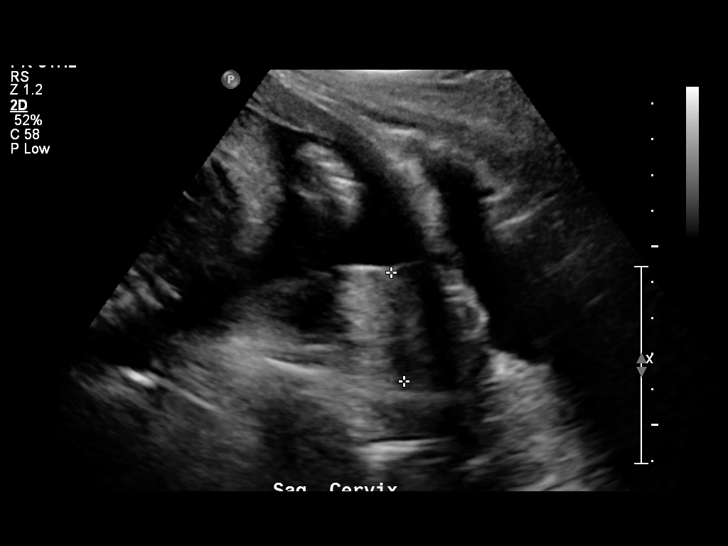
[im 14/33]
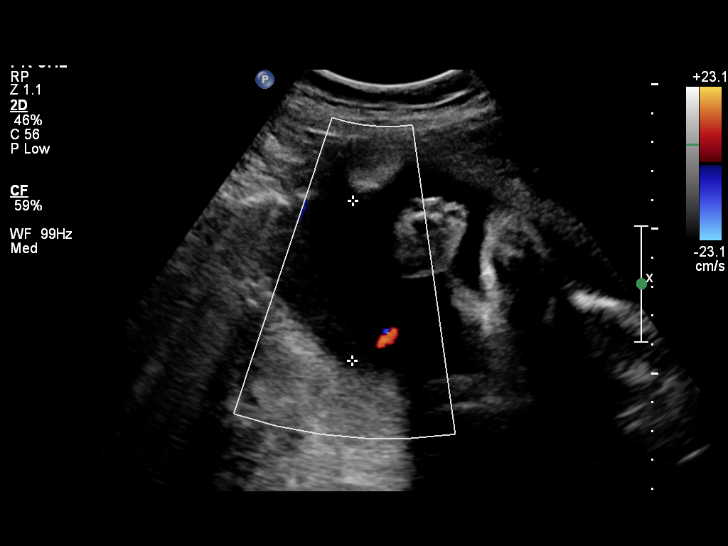
[im 16/33]
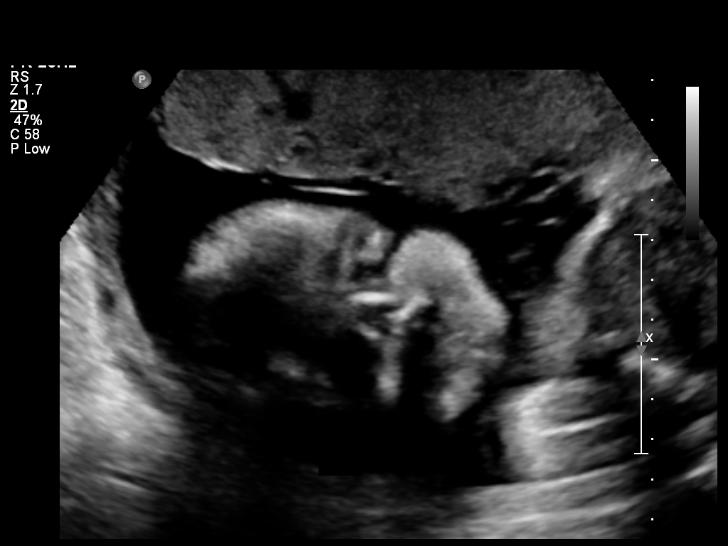
[im 18/33]
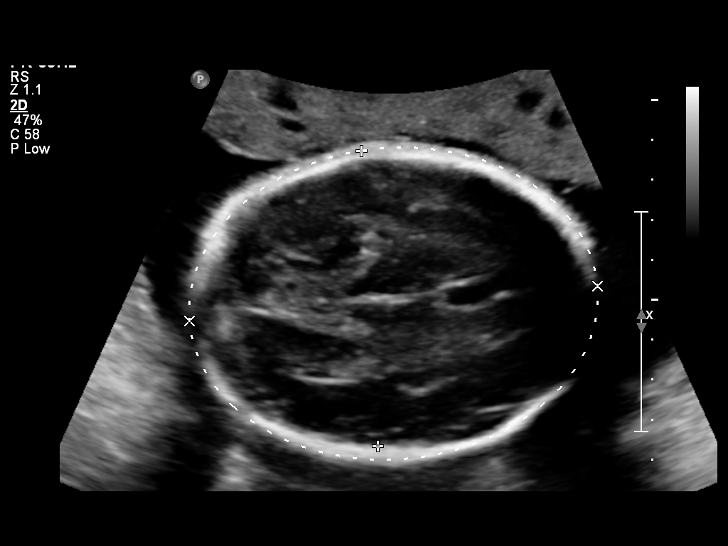
[im 21/33]
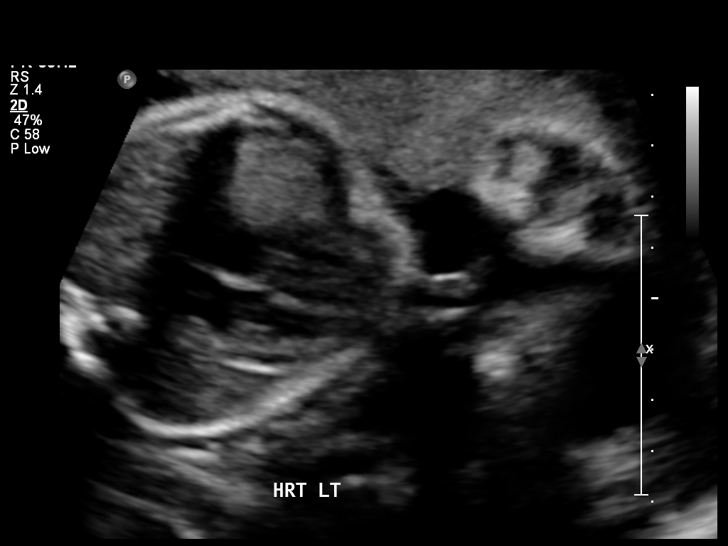
[im 23/33]
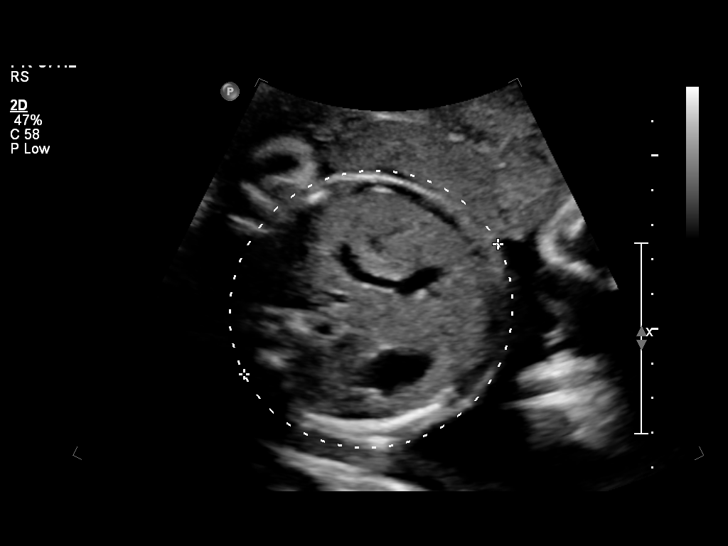
[im 25/33]
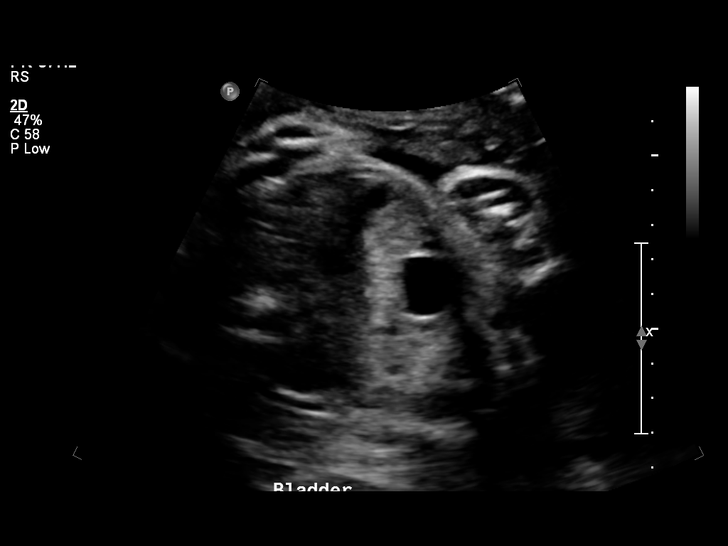
[im 28/33]
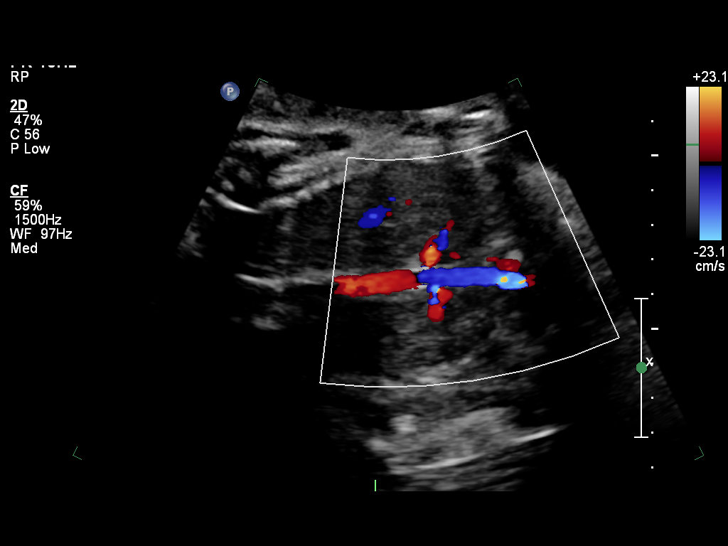
[im 30/33]
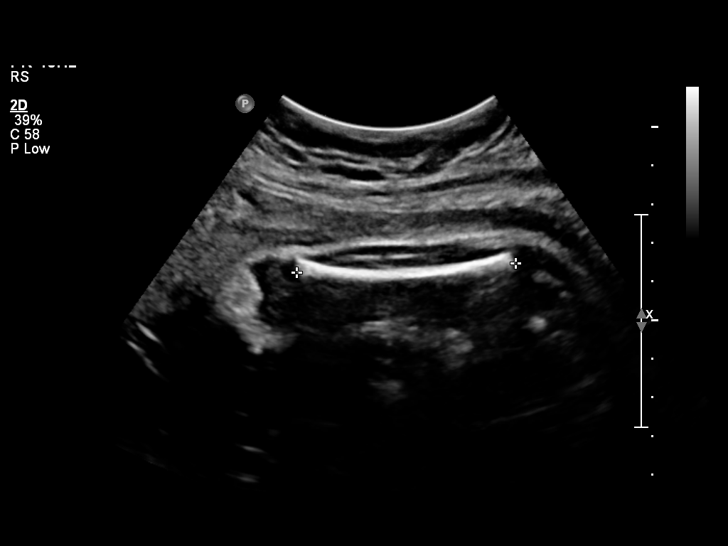
[im 33/33]
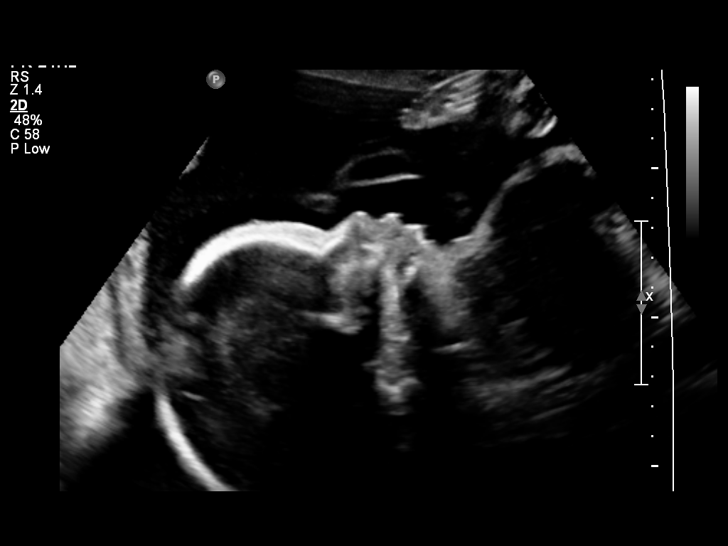

[14 of 28 positions shown; findings below may reference images not displayed]

IMPRESSION: See AS Obstetric US report.

## 2011-06-01 ENCOUNTER — Encounter: Payer: Self-pay | Admitting: Emergency Medicine

## 2011-06-01 ENCOUNTER — Emergency Department (HOSPITAL_COMMUNITY)
Admission: EM | Admit: 2011-06-01 | Discharge: 2011-06-01 | Disposition: A | Discharge status: Home / Self Care | Payer: Medicaid Other | Attending: Emergency Medicine | Admitting: Emergency Medicine

## 2011-06-01 DIAGNOSIS — R0602 Shortness of breath: Secondary | ICD-10-CM | POA: Insufficient documentation

## 2011-06-01 DIAGNOSIS — J329 Chronic sinusitis, unspecified: Secondary | ICD-10-CM | POA: Insufficient documentation

## 2011-06-01 DIAGNOSIS — M7989 Other specified soft tissue disorders: Secondary | ICD-10-CM | POA: Insufficient documentation

## 2011-06-01 DIAGNOSIS — B349 Viral infection, unspecified: Secondary | ICD-10-CM

## 2011-06-01 DIAGNOSIS — R0609 Other forms of dyspnea: Secondary | ICD-10-CM | POA: Insufficient documentation

## 2011-06-01 DIAGNOSIS — R51 Headache: Secondary | ICD-10-CM | POA: Insufficient documentation

## 2011-06-01 DIAGNOSIS — B9789 Other viral agents as the cause of diseases classified elsewhere: Secondary | ICD-10-CM | POA: Insufficient documentation

## 2011-06-01 DIAGNOSIS — H5789 Other specified disorders of eye and adnexa: Secondary | ICD-10-CM | POA: Insufficient documentation

## 2011-06-01 DIAGNOSIS — R0989 Other specified symptoms and signs involving the circulatory and respiratory systems: Secondary | ICD-10-CM | POA: Insufficient documentation

## 2011-06-01 DIAGNOSIS — Z79899 Other long term (current) drug therapy: Secondary | ICD-10-CM | POA: Insufficient documentation

## 2011-06-01 HISTORY — DX: Essential (primary) hypertension: I10

## 2011-06-01 MED ORDER — AMOXICILLIN-POT CLAVULANATE 875-125 MG PO TABS: 1.0000 | ORAL_TABLET | Freq: Two times a day (BID) | ORAL | Status: AC

## 2011-06-01 MED ORDER — OLOPATADINE HCL 0.1 % OP SOLN: 1.0000 [drp] | Freq: Two times a day (BID) | OPHTHALMIC | Status: AC | PRN

## 2011-06-01 NOTE — ED Provider Notes (Signed)
History     CSN: [id number] Arrival date & time: 06/01/2011 12:59 PM   First MD Initiated Contact with Patient 06/01/11 1321      Chief Complaint  Patient presents with  . Headache    (Consider location/radiation/quality/duration/timing/severity/associated sxs/prior treatment) HPI  30 year old with history of pregnancy induced HTN that required hospital admission presents with eyelid swelling, SOB, and hand swelling for two days.  She first noted eyelid swelling, bilateral but L > R, Monday night when she woke from sleep.  This persisted Tuesday and she had accompanying itching but no erythema.  This morning it was resolved, but the swelling and itching then recurred leading to her presentation.  She has also noted that her hands are puffy/swollen the last two days and has had some DOE for the past two days walking up a hill outside.  She has had to sleep on her side of on her back with three pillows as well because she has trouble breathing laying flat on her back for the past few days.  She also notes a 12 lb weight gain in the past 10 days (215lbs -> 227 lbs, was 225 lbs a couple days ago).  She had a headache yesterday and last night in her L temple and behind L eye, 7/10 which patient describes as tolerable.  Today it has resolved.  She had a bad cold 2-3 wks ago and had L maxillary area sinus pain/pressure over the weekend that she too Dayquil and Nyquil for.   No dysuria, hematuria, increased urinary frequency.  No chest pain.  Past Medical History  Diagnosis Date  . Hypertension   Pregnancy induced HTN  Past Surgical History  Procedure Date  . Cesarean section 03/13/2010    History reviewed. No pertinent family history.  No tobacco Occasional alcohol use No drug use  Review of Systems  All other systems reviewed and are negative.    Allergies  Codeine  Home Medications   Current Outpatient Rx  Name Route Sig Dispense Refill  . LISINOPRIL-HYDROCHLOROTHIAZIDE  20-12.5 MG PO TABS Oral Take 1 tablet by mouth daily.      Dorothyann Peng MULTI-SYMPTOM COLD/FLU PO Oral Take 1 capsule by mouth every 4 (four) hours as needed. Cold Symptoms       BP 123/93  Pulse 63  Temp(Src) 99.1 F (37.3 C) (Oral)  Resp 20  Ht 5\' 4"  (1.626 m)  Wt 227 lb (102.967 kg)  BMI 38.96 kg/m2  SpO2 100%  Physical Exam  General: alert, well-developed, and cooperative to examination.  Head: normocephalic and atraumatic.  Eyes: vision grossly intact, pupils equal, pupils round, pupils reactive to light, no injection and anicteric.  Mild fullness over L eye, no erythema no tenderness to palpation.  Conjunctiva clear bilateral.  No definite fullness/swelling over R eye. Mouth: pharynx pink and moist, no erythema, and no exudates.  Neck: no JVD. Lungs: normal respiratory effort, no accessory muscle use, normal breath sounds, no crackles, and no wheezes. Heart: normal rate, regular rhythm, no murmur, no gallop, and no rub.  Pulses: 2+ DP/PT pulses bilaterally Extremities: No cyanosis, clubbing.  Mild L ankle/foot edema, no calf edema in either leg. Neurologic: alert & oriented X3, cranial nerves II-XII intact, strength normal in all extremities. Skin: turgor normal and no rashes.     ED Course  Procedures (including critical care time)  Labs Reviewed - No data to display No results found.   1. Viral syndrome   2. Sinusitis  MDM  I discussed all aspects of this case with my attending Dr. Adriana Simas who also interviewed and examined the patient.  Plan is to discharge patient on Augmentin for sinus infection, patanol eye drops PRN for L eye itching, and L eye lid warm soaks.  Follow-up with new primary care doctor locally (patient unsure of name but in same office as Dr. Lodema Hong).          Blanca Friend, MD 06/01/11 1455

## 2011-06-01 NOTE — ED Notes (Signed)
This is third day of waking with swelling in her eyes.  Has taken OTC medication NyQuil and DayQuil -  Was using this for sinus pressure.  Is on medication for hypertension- taking as prescribed.  Today sinus area not painful but feels like her eyes are still swollen

## 2011-06-01 NOTE — ED Notes (Signed)
Alert, NAD, says her eyes feel swollen, ? Very little swelling if any noticed by me.  No  Rashes or sob.

## 2011-06-02 NOTE — ED Provider Notes (Signed)
  I performed a history and physical examination of Caitlyn [name] and discussed her management with Dr. Yaakov Guthrie.  I agree with the history, physical, assessment, and plan of care, with the following exceptions: None  I was present for the following procedures: None Time Spent in Critical Care of the patient: None Time spent in discussions with the patient and family:  Patient seen by me. She is nontoxic. no clinical evidence of congestive heart failure.  No neuro deficits  Vernona Rieger, MD 06/02/11 [phone]

## 2011-07-11 ENCOUNTER — Ambulatory Visit: Payer: Medicaid Other | Admitting: Family Medicine

## 2011-07-14 ENCOUNTER — Encounter: Payer: Self-pay | Admitting: Family Medicine

## 2011-08-07 IMAGING — US US FETAL BPP W/O NONSTRESS
1 series · 4 of 4 positions shown · non-contrast
Comparison: none

OBSTETRICAL ULTRASOUND:
 This ultrasound exam was performed in the [HOSPITAL] Ultrasound Department.  The OB US report was generated in the AS system, and faxed to the ordering physician.  This report is also available in [HOSPITAL]?s Access[employer] and in [REDACTED] PACS.

[Series 1: us fetal bpp w/o nonstress · non-contrast · 4 acquisitions, 4 frames shown]
[im 1/4]
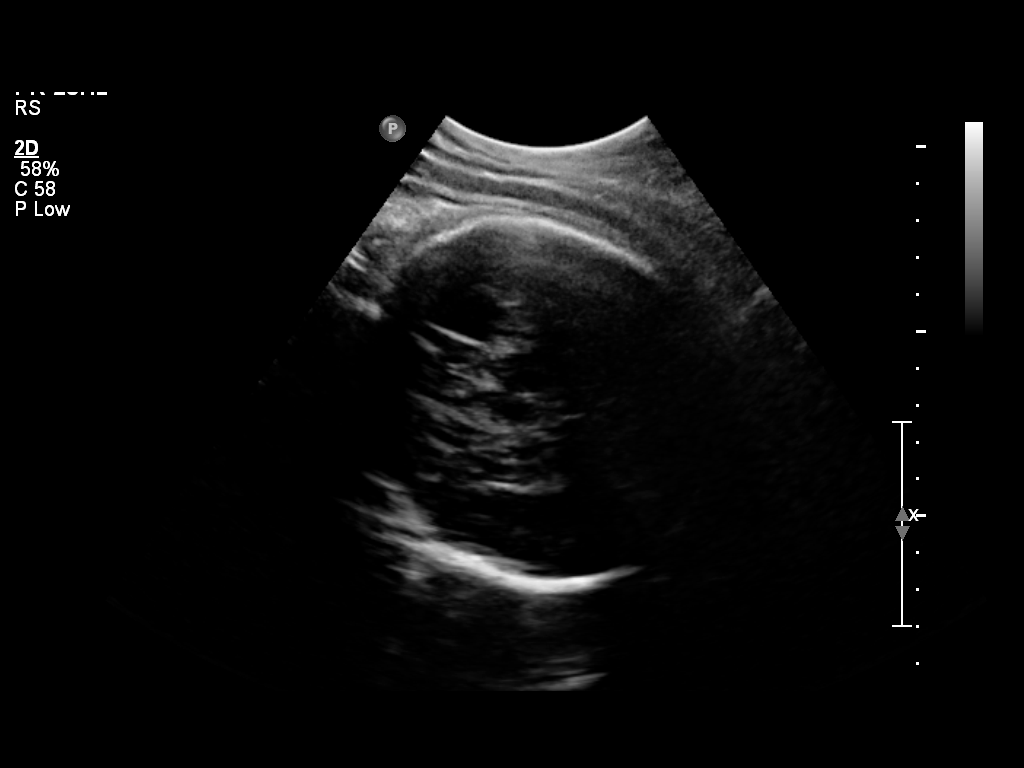
[im 2/4]
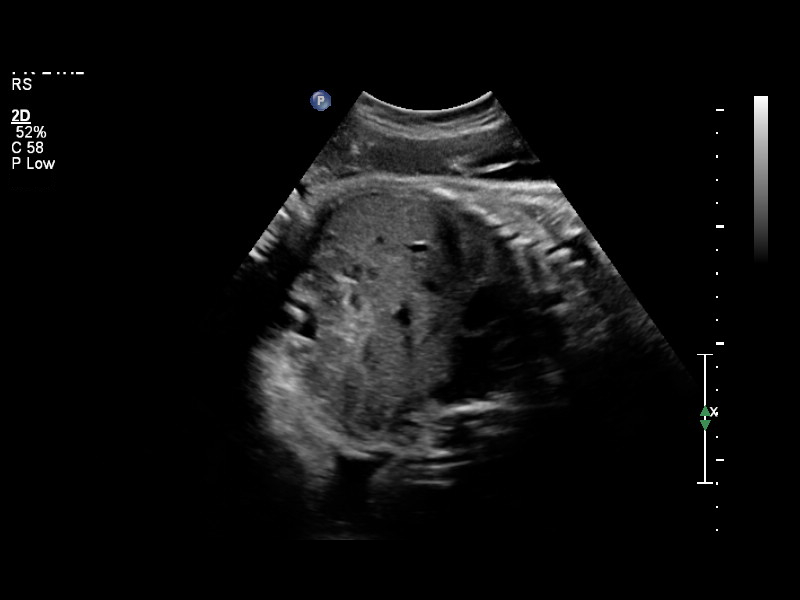
[im 3/4]
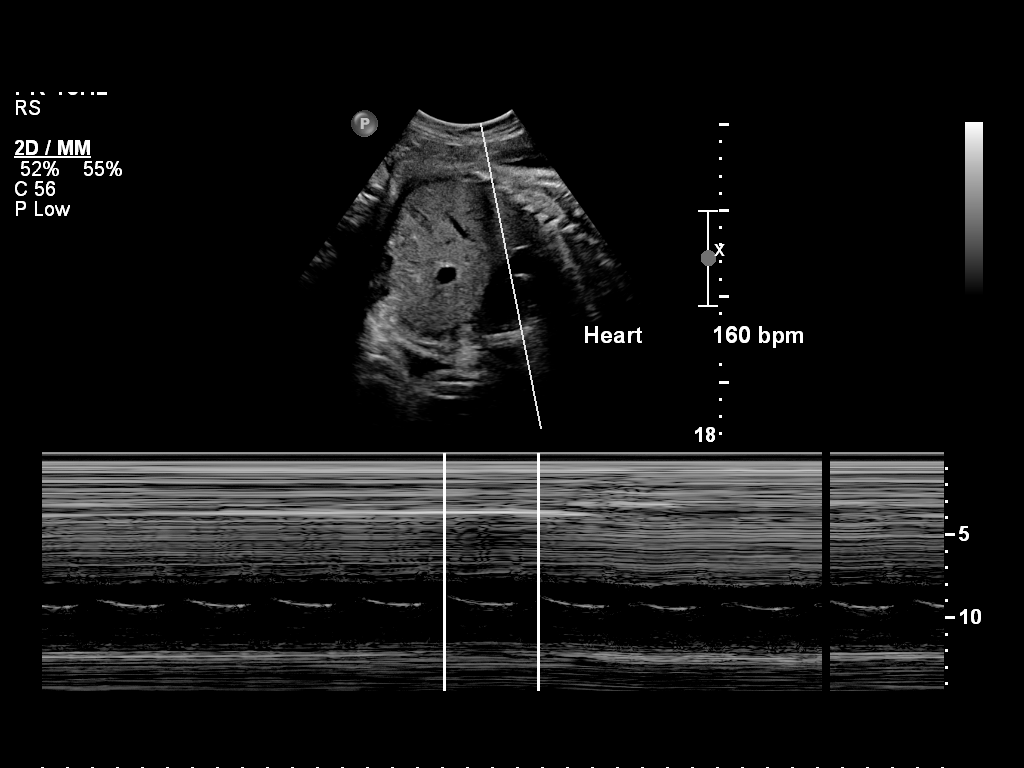
[im 4/4]
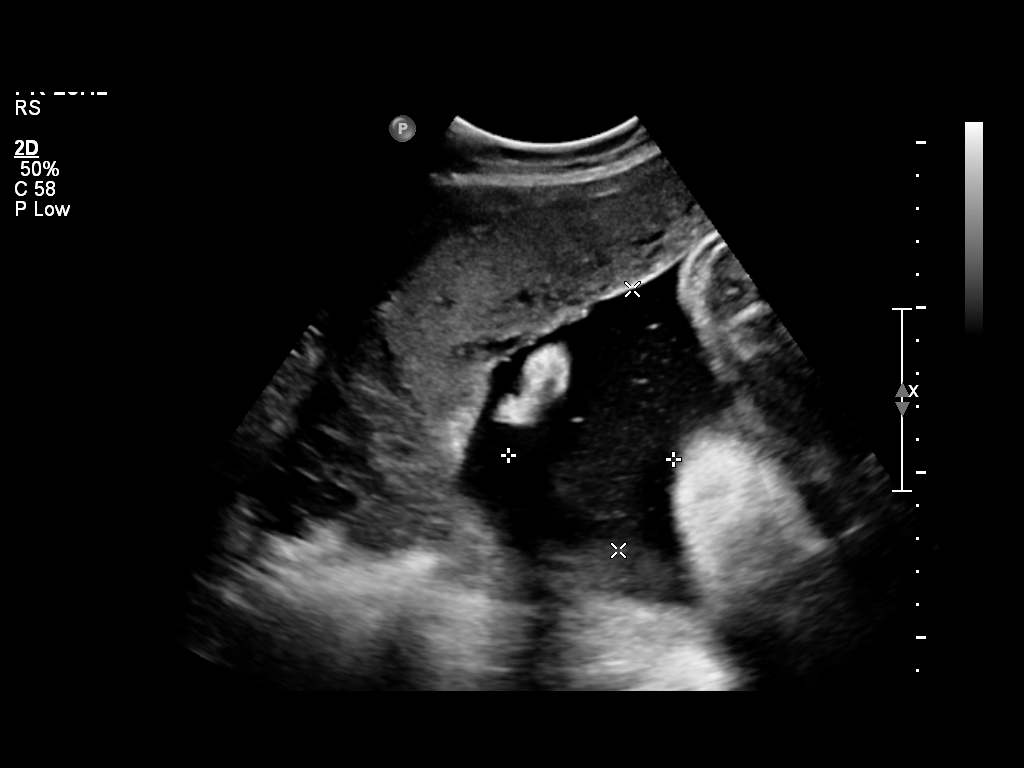

[4 of 4 positions shown; findings below may reference images not displayed]

IMPRESSION: See AS Obstetric US report.

## 2011-08-16 IMAGING — CR DG CHEST 2V
2 series · 2 of 2 positions shown · non-contrast
Comparison: CT chest 10/17/2008 and chest radiograph 10/17/2008

CLINICAL DATA: Allergic reaction to medication.  Cesarean section
03/13/2010 with postoperative shortness of breath and hand
swelling.

CHEST - 2 VIEW

[w chest pa]
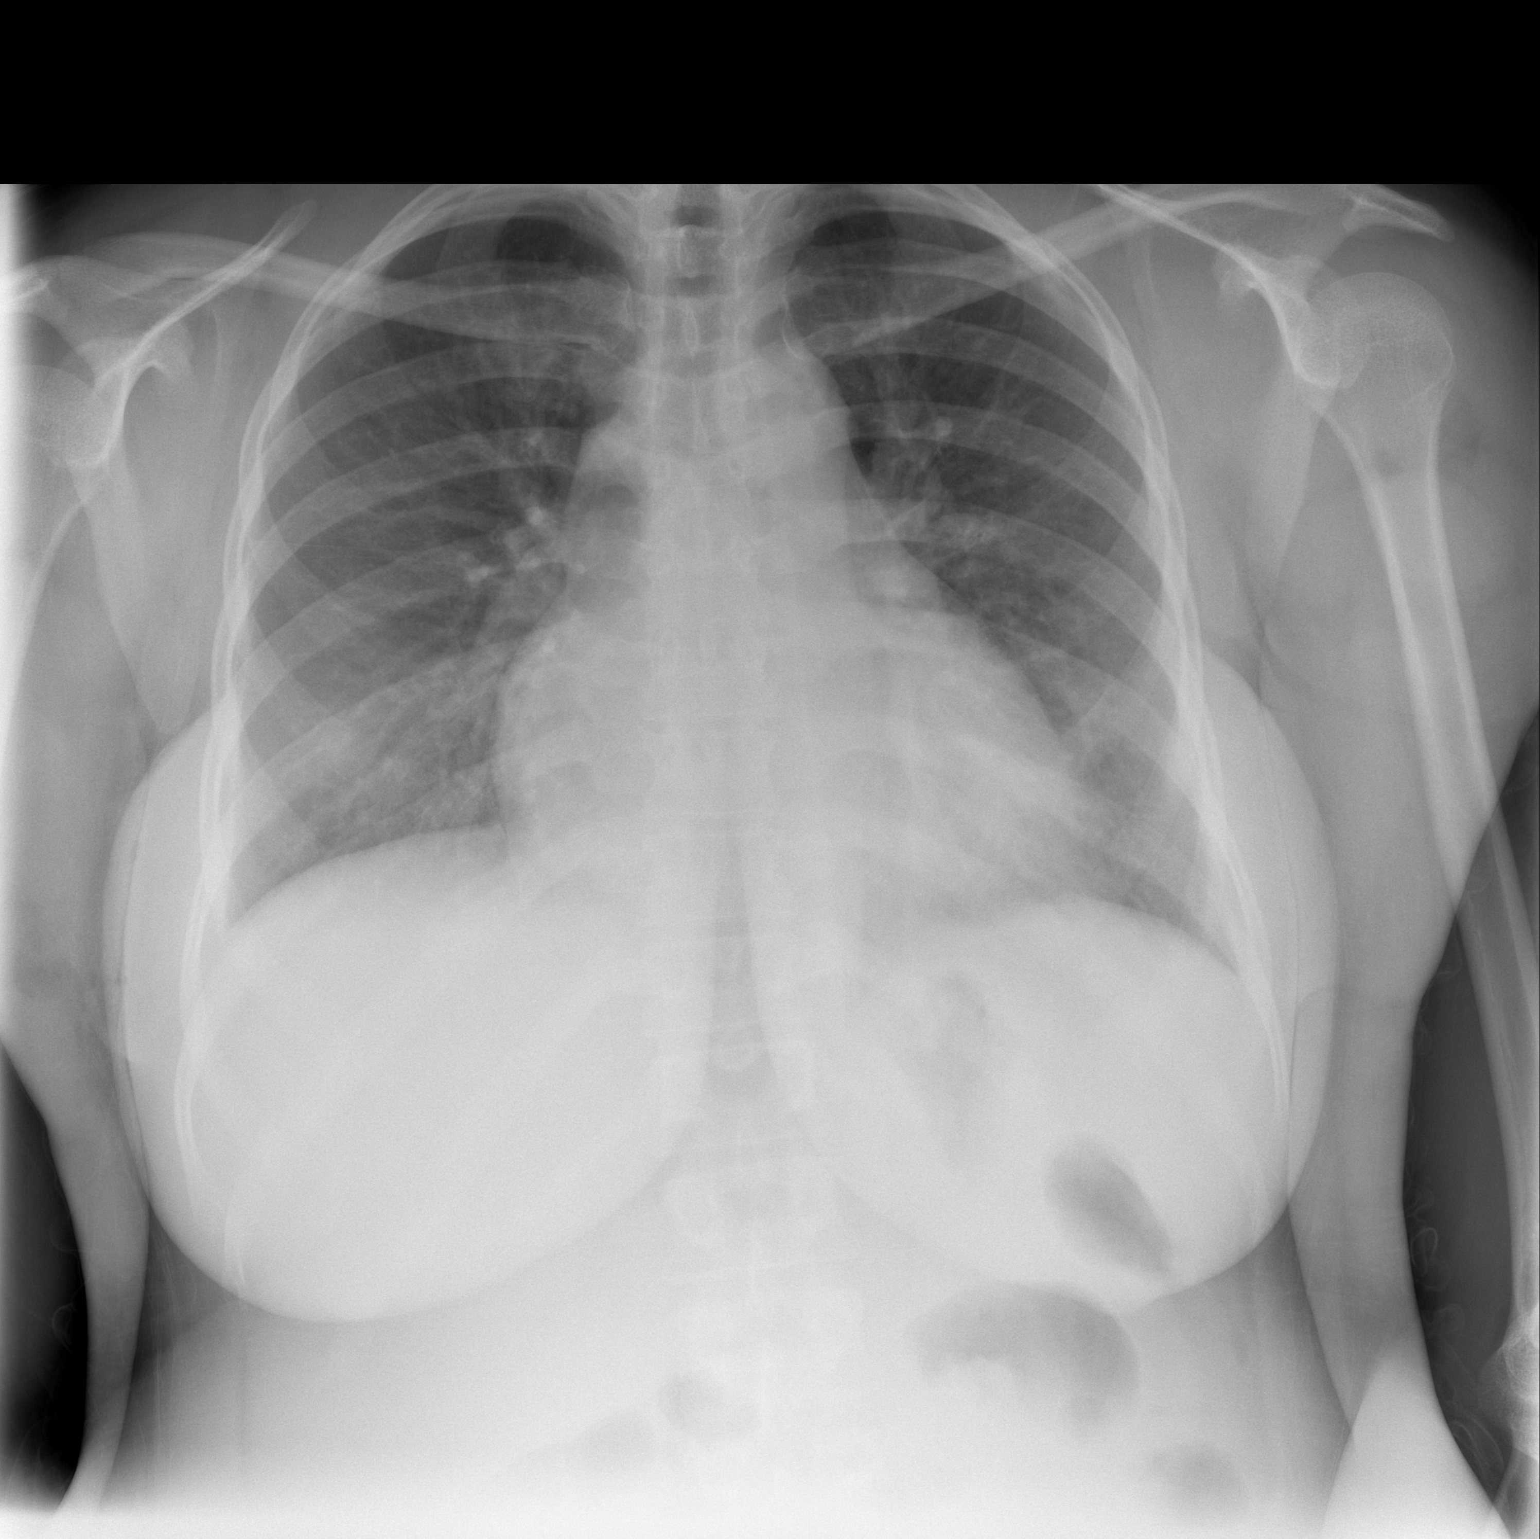

[w chest lat]
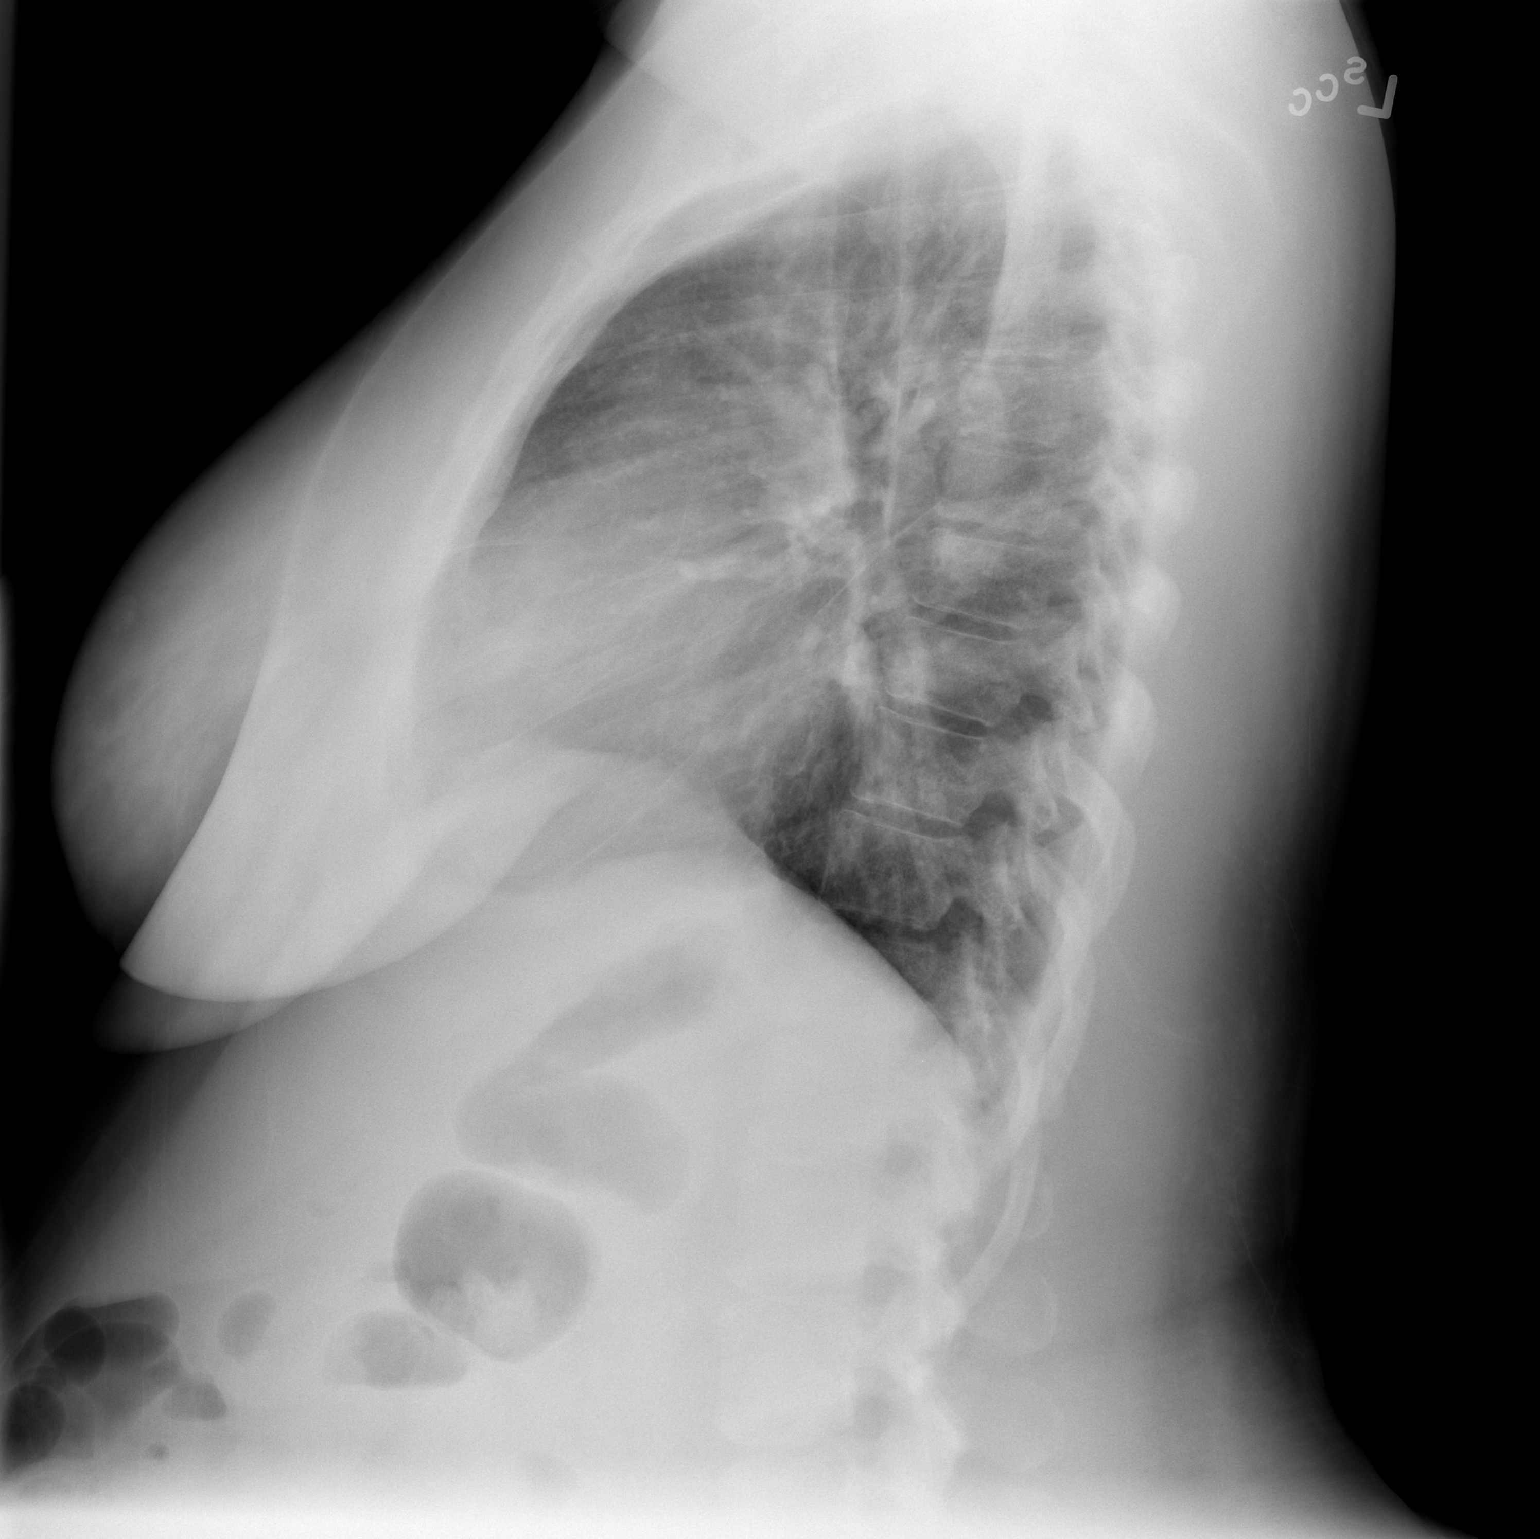

[2 of 2 positions shown; findings below may reference images not displayed]

FINDINGS: Trachea is midline.  Heart is at the upper limits of
normal in size.  Mild bibasilar interstitial prominence and
indistinctness.  No pleural fluid.
IMPRESSION: Mild bibasilar interstitial prominence and indistinctness may be
due to low lung volumes with vascular crowding.  Atelectasis, edema
and viral pneumonia can also have this appearance.

## 2011-08-17 IMAGING — CR DG CHEST 1V PORT
1 series · 1 of 1 positions shown · non-contrast
Comparison: 03/17/2010

CLINICAL DATA: Short of breath.  Cough.  Pregnancies hypertension.
Postpartum.

PORTABLE CHEST - 1 VIEW

[view not recorded]
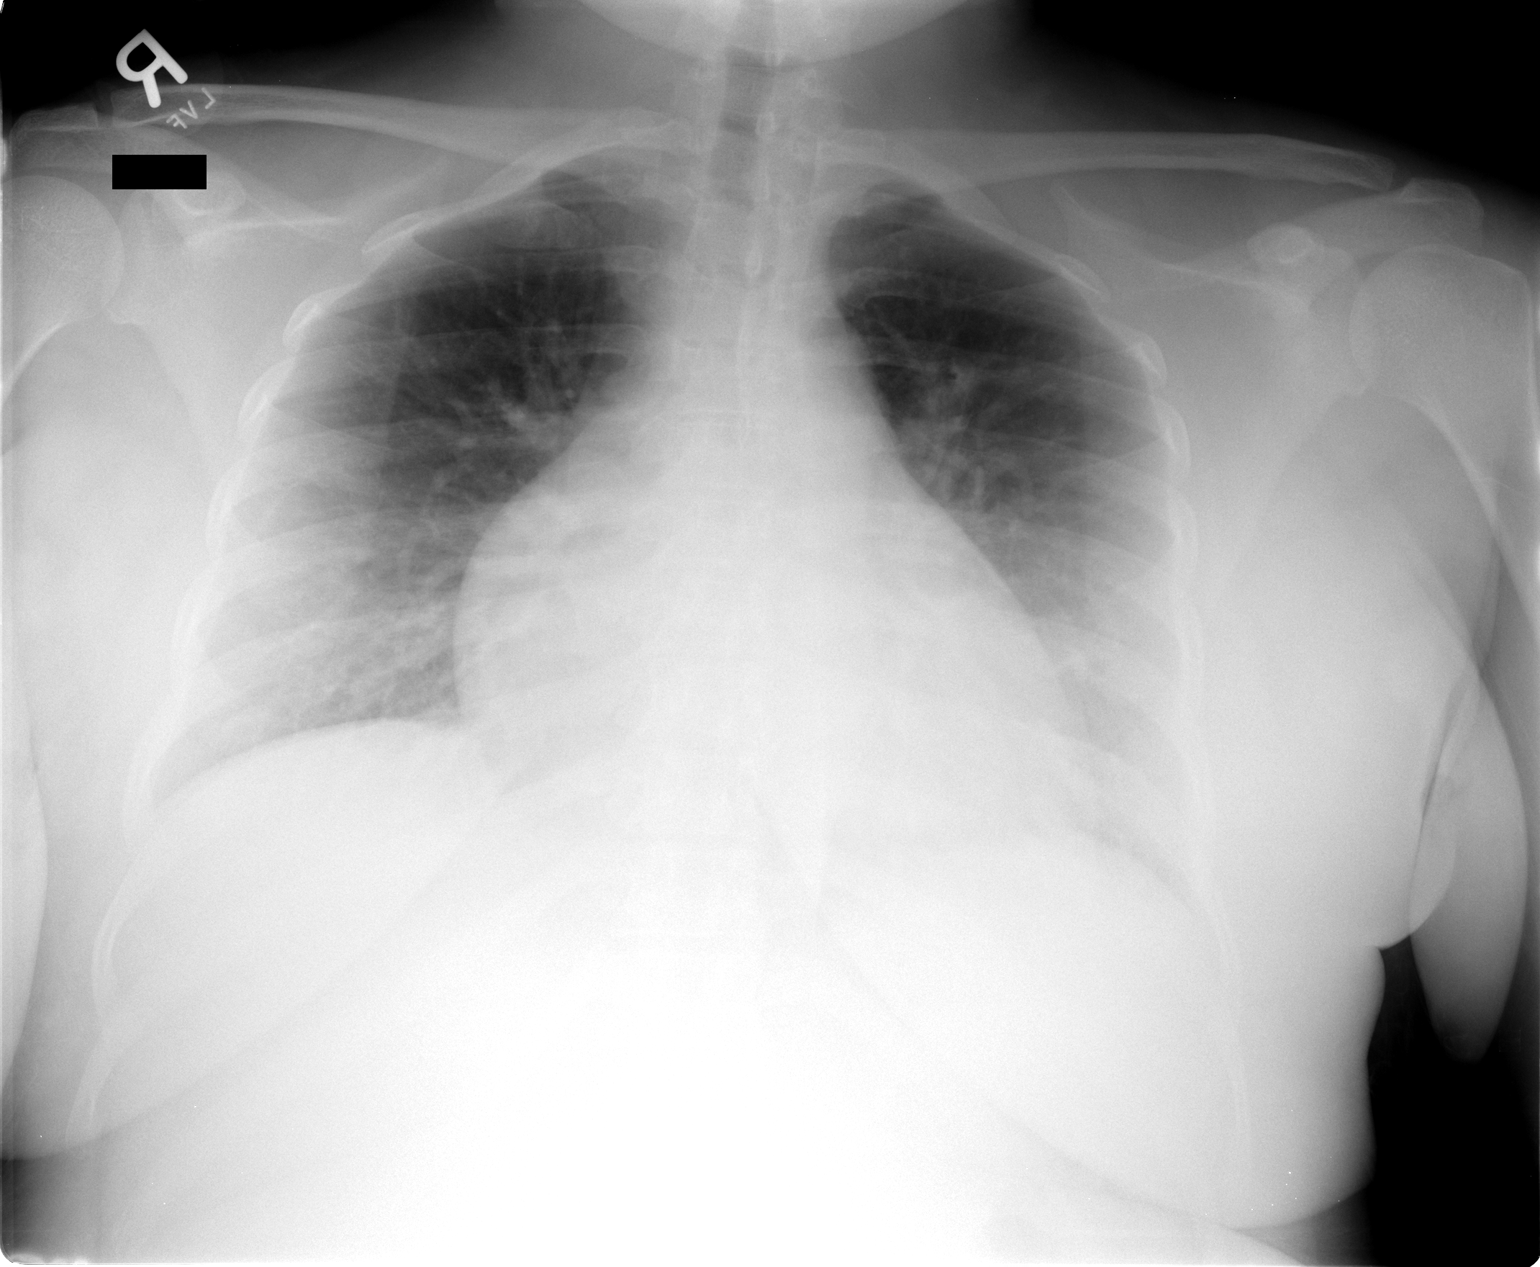

[1 of 1 positions shown; findings below may reference images not displayed]

FINDINGS: The low lung volumes are seen.  Heart size is stable.
Both lungs are clear.  No evidence of pulmonary infiltrate or
edema.  No evidence of pleural effusion.
IMPRESSION: Low lung volumes.  No acute findings.

## 2011-08-17 IMAGING — US US ABDOMEN COMPLETE
2 series · 13 of 25 positions shown · non-contrast
Comparison: None.

CLINICAL DATA: Elevated liver function tests.  6 days postpartum.
Severe pregnancy induced hypertension.

ABDOMINAL ULTRASOUND COMPLETE

[Series 1: us abdomen complete · 0.25mm/px · 12 of 49 slices shown (1 of 2)]
[im 1/49]
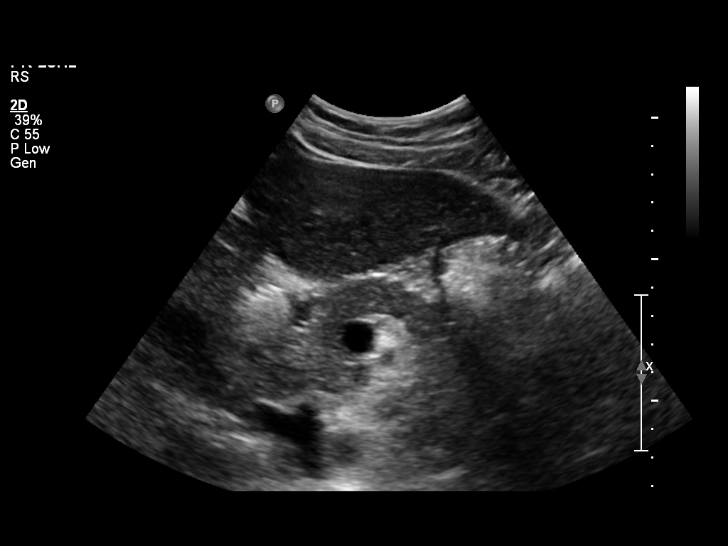
[im 5/49]
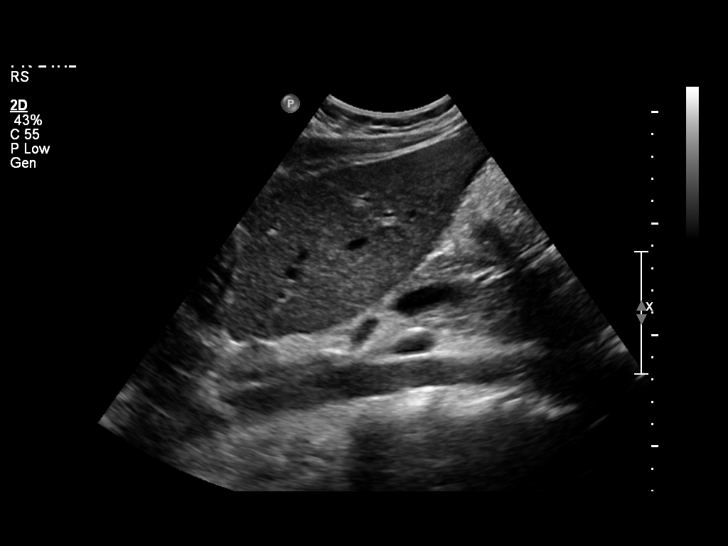
[im 9/49]
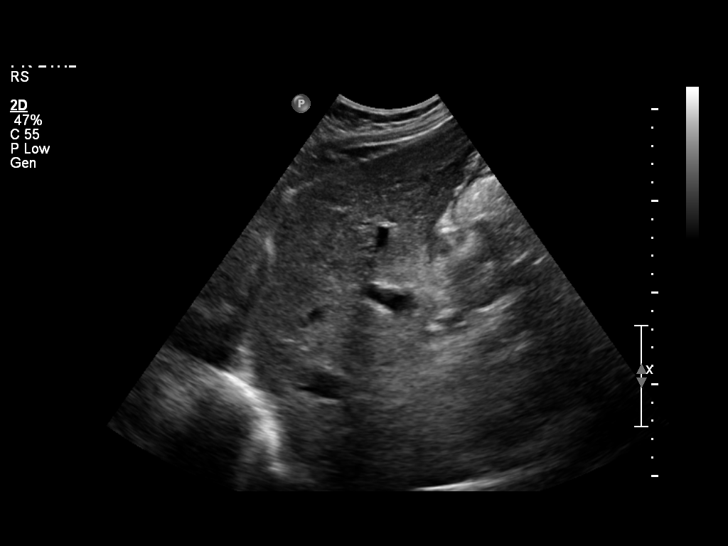
[im 13/49]
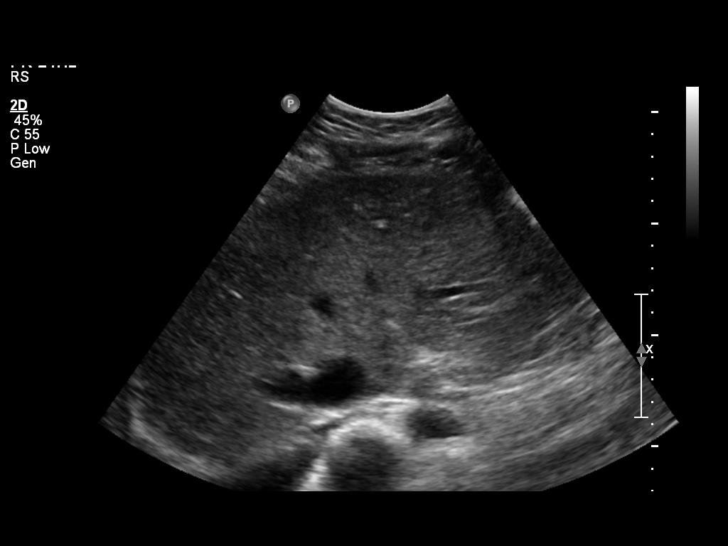
[im 17/49]
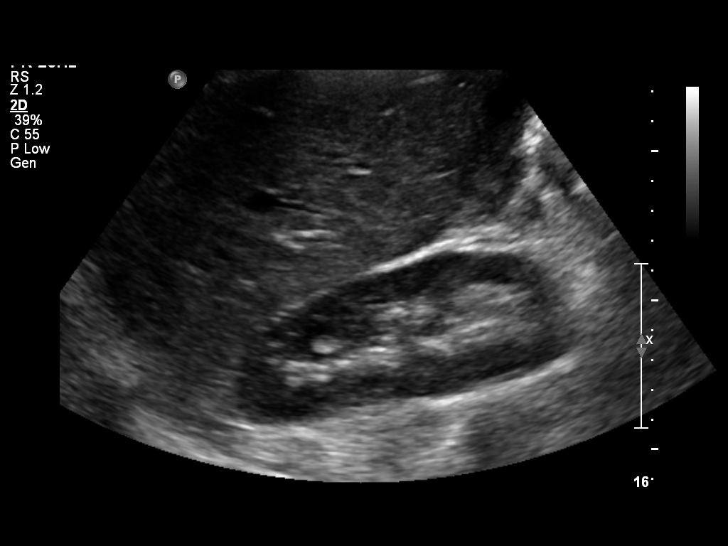
[im 21/49]
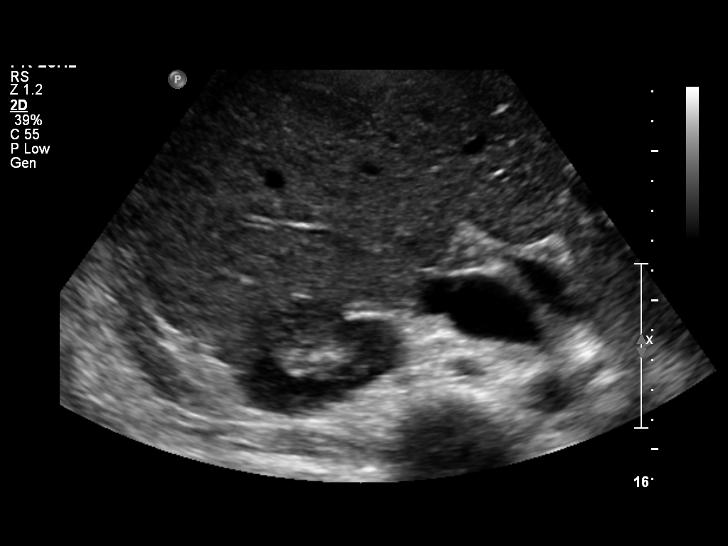
[im 26/49]
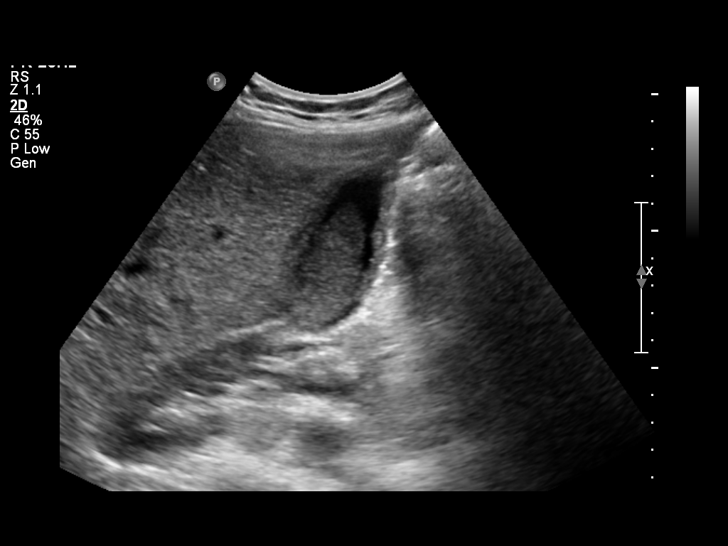
[im 30/49]
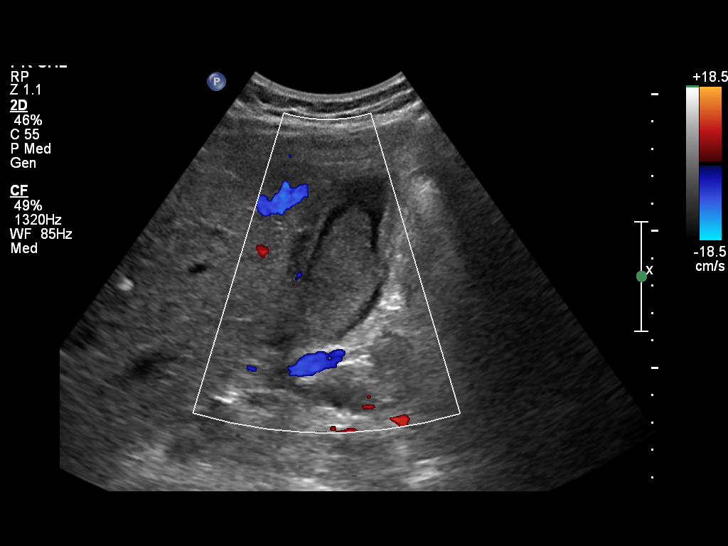
[im 34/49]
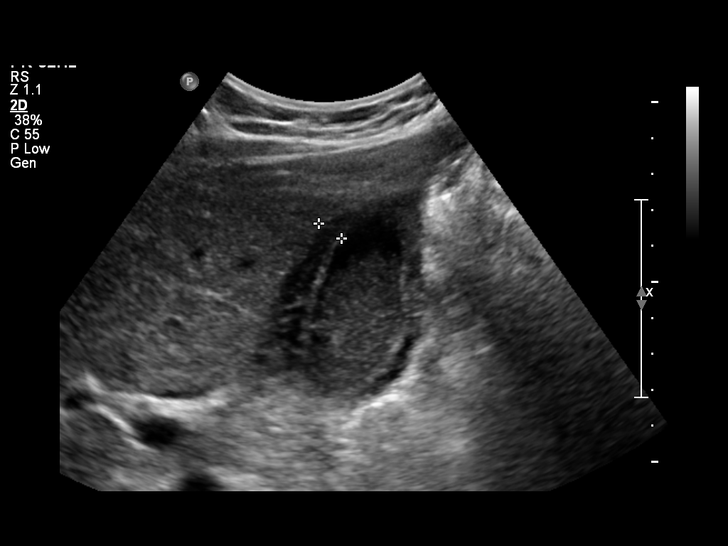
[im 38/49]
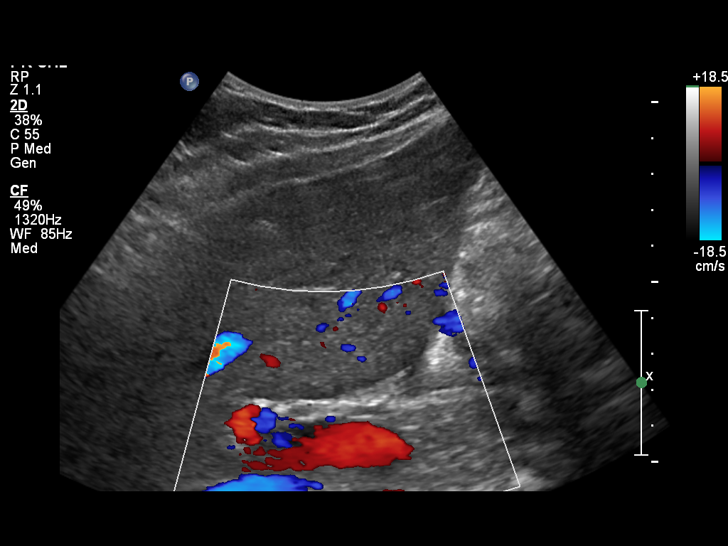
[im 42/49]
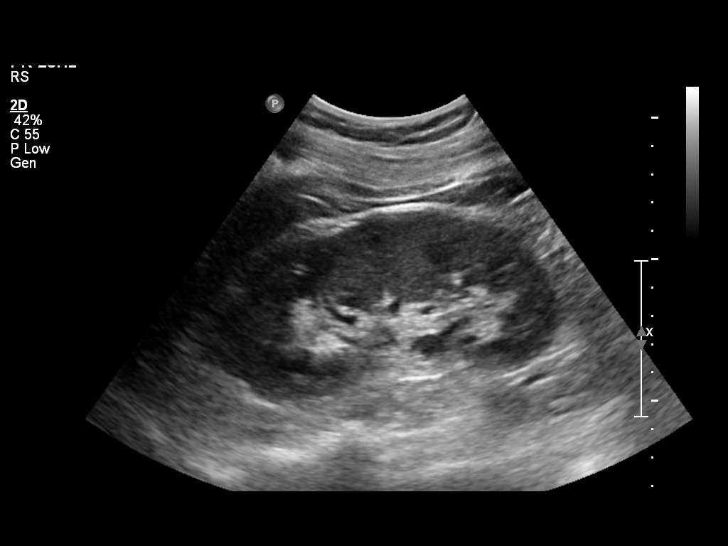
[im 46/49]
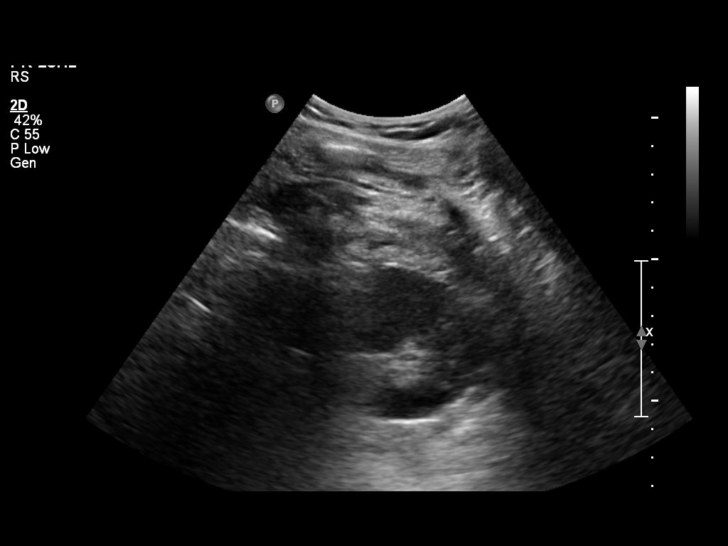

[Series 1: us abdomen complete · 0.29mm/px · 1 of 1 slices shown (2 of 2)]
[im 1/1]
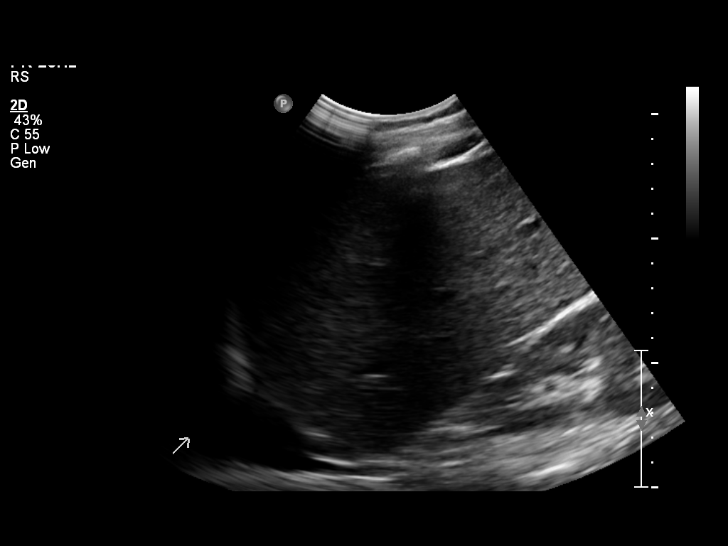

[13 of 25 positions shown; findings below may reference images not displayed]

FINDINGS: Gallbladder:  The gallbladder contains echogenic sludge, but no
discrete gallstones are identified.  Mild diffuse gallbladder wall
thickening is seen measuring up to 8 mm.  The gallbladder is
nondilated, and no definite sonographic Murphy's sign noted by the
sonographer.

Common Bile Duct:  Within normal limits in caliber. Measures 3 mm
in diameter.

Liver: No focal mass lesion identified.  Within normal limits in
parenchymal echogenicity. No evidence of subcapsular hematoma or
perihepatic fluid.  Incidental note is made of tiny bilateral
pleural effusions.

IVC:  Appears normal.

Pancreas:  No abnormality identified.

Spleen:  Within normal limits in size and echotexture.

Right kidney:  Normal in size and parenchymal echogenicity.  No
evidence of mass or hydronephrosis.

Left kidney:  Normal in size and parenchymal echogenicity.  No
evidence of mass or hydronephrosis.

Abdominal Aorta:  No aneurysm identified.
IMPRESSION: 1.  Gallbladder sludge and diffuse gallbladder wall thickening,
which are nonspecific findings.  No discrete gallstones,
gallbladder dilatation, or sonographic Murphy's sign.  If there is
clinical suspicion for acalculus cholecystitis, nuclear medicine
hepatobiliary scan could be performed for further evaluation.
2.  Normal sonographic appearance of liver.  No evidence of
subcapsular hematoma or biliary dilatation.
3.  Tiny bilateral pleural effusions incidentally noted.

## 2012-02-07 ENCOUNTER — Emergency Department (HOSPITAL_COMMUNITY)
Admission: EM | Admit: 2012-02-07 | Discharge: 2012-02-07 | Disposition: A | Discharge status: Home / Self Care | Payer: Medicaid Other | Attending: Emergency Medicine | Admitting: Emergency Medicine

## 2012-02-07 ENCOUNTER — Encounter (HOSPITAL_COMMUNITY): Payer: Self-pay | Admitting: Emergency Medicine

## 2012-02-07 DIAGNOSIS — W261XXA Contact with sword or dagger, initial encounter: Secondary | ICD-10-CM | POA: Insufficient documentation

## 2012-02-07 DIAGNOSIS — S91119A Laceration without foreign body of unspecified toe without damage to nail, initial encounter: Secondary | ICD-10-CM

## 2012-02-07 DIAGNOSIS — Z885 Allergy status to narcotic agent status: Secondary | ICD-10-CM | POA: Insufficient documentation

## 2012-02-07 DIAGNOSIS — S91109A Unspecified open wound of unspecified toe(s) without damage to nail, initial encounter: Secondary | ICD-10-CM | POA: Insufficient documentation

## 2012-02-07 DIAGNOSIS — I10 Essential (primary) hypertension: Secondary | ICD-10-CM | POA: Insufficient documentation

## 2012-02-07 DIAGNOSIS — W260XXA Contact with knife, initial encounter: Secondary | ICD-10-CM | POA: Insufficient documentation

## 2012-02-07 MED ORDER — IBUPROFEN 800 MG PO TABS: 800.0000 mg | ORAL_TABLET | Freq: Three times a day (TID) | ORAL | Status: AC

## 2012-02-07 MED ORDER — CEPHALEXIN 250 MG PO CAPS: 250.0000 mg | ORAL_CAPSULE | Freq: Four times a day (QID) | ORAL | Status: AC

## 2012-02-07 NOTE — ED Notes (Signed)
Pt c/o right foot pain. Pt states she was walking in her kitchen when she stepped on a steak knife. Pt states she removed the knife herself.

## 2012-02-07 NOTE — ED Notes (Signed)
Pt states knife was laying in floor and she stepped on it cutting her middle third toe.

## 2012-02-07 NOTE — ED Provider Notes (Signed)
History     CSN: [id number]  Arrival date & time 02/07/12  1123   None     Chief Complaint  Patient presents with  . Extremity Laceration    (Consider location/radiation/quality/duration/timing/severity/associated sxs/prior treatment) HPI Comments: Patient c/o pain and laceration to her right third toe that began after she stepped on a knife that was lying on the floor.  States she was "barefooted" at the time.  States she has pain to her toe when she tries to wear shoes.  She also states that she cleaned the wound with peroxide and applied hydrocortisone cream.  States her last Td was within 5 yrs.  She denies bleeding, swelling numbness or proximal pain.   Patient is a 31 y.o. female presenting with skin laceration. The history is provided by the patient.  Laceration  Incident onset: day prior to ed arrival. The laceration is located on the right foot. The laceration is 2 cm in size. Injury mechanism: kitchen knife, pt unsure if the knife was clean. The pain is mild. The pain has been constant since onset. She reports no foreign bodies present. Her tetanus status is UTD.    Past Medical History  Diagnosis Date  . Hypertension     Past Surgical History  Procedure Date  . Cesarean section 03/13/2010    No family history on file.  History  Substance Use Topics  . Smoking status: Never Smoker   . Smokeless tobacco: Not on file  . Alcohol Use: No    OB History    Grav Para Term Preterm Abortions TAB SAB Ect Mult Living                  Review of Systems  Musculoskeletal: Positive for arthralgias and gait problem. Negative for joint swelling.  Skin:       laceration  Neurological: Negative for dizziness, weakness and numbness.  Hematological: Does not bruise/bleed easily.  All other systems reviewed and are negative.    Allergies  Codeine  Home Medications   Current Outpatient Rx  Name Route Sig Dispense Refill  . LISINOPRIL-HYDROCHLOROTHIAZIDE 20-12.5 MG PO  TABS Oral Take 1 tablet by mouth daily.      . OLOPATADINE HCL 0.1 % OP SOLN Left Eye Place 1 drop into the left eye 2 (two) times daily as needed (itching). Do not use for more than 7 days 5 mL 0  . DAYQUIL MULTI-SYMPTOM COLD/FLU PO Oral Take 1 capsule by mouth every 4 (four) hours as needed. Cold Symptoms       BP 139/79  Pulse 104  Temp 98.9 F (37.2 C) (Oral)  Resp 18  Ht 5\' 4"  (1.626 m)  Wt 215 lb (97.523 kg)  BMI 36.90 kg/m2  SpO2 100%  LMP 01/08/2012  Physical Exam  Nursing note and vitals reviewed. Constitutional: She is oriented to person, place, and time. She appears well-developed and well-nourished. No distress.  HENT:  Head: Normocephalic and atraumatic.  Cardiovascular: Normal rate, regular rhythm and normal heart sounds.   Pulmonary/Chest: Effort normal and breath sounds normal.  Musculoskeletal: She exhibits tenderness. She exhibits no edema.       Right foot: She exhibits tenderness and laceration. She exhibits normal range of motion, no bony tenderness, no swelling, normal capillary refill, no crepitus and no deformity.       2cm superficial laceration to the plantar surface of the right proximal third toe.  No edema, FB, or bleeding.  CR< 2 sec, sensation intact, DP pulse  brisk  Neurological: She is alert and oriented to person, place, and time. She exhibits normal muscle tone. Coordination normal.  Skin: Laceration noted.       See MS exam    ED Course  Procedures (including critical care time)  Labs Reviewed - No data to display No results found.      MDM   Upon entering the exam room, patient continued to talk on her cell phone even after announcing myself, so I left the room and returned later.    Td is UTD.  Minimal laceration to  the plantar surface fo the proximal third toe.  Suture repair is not indicated.  Wound was cleaned and dressing applied.  Will start antibiotics and advised her to return for any signs of infection.  Pt agreed to care  plan and verbalized understanding         Zackari Ruane L. [place], [place] 02/09/12 1631

## 2012-02-09 NOTE — ED Provider Notes (Signed)
Medical screening examination/treatment/procedure(s) were performed by non-physician practitioner and as supervising physician I was immediately available for consultation/collaboration. Devoria Albe, MD, FACEP   Ward Givens, MD 02/09/12 [phone]

## 2012-02-09 NOTE — ED Notes (Signed)
Worknot extended by Dr. Manus Gunning to be out until 02-13-12 because of wearing postop shoe. Pt has to wear steel toe shoes at work.

## 2013-07-29 ENCOUNTER — Other Ambulatory Visit: Payer: Self-pay

## 2013-08-06 ENCOUNTER — Ambulatory Visit (INDEPENDENT_AMBULATORY_CARE_PROVIDER_SITE_OTHER): Payer: Medicaid Other

## 2013-08-06 DIAGNOSIS — N926 Irregular menstruation, unspecified: Secondary | ICD-10-CM

## 2013-08-06 DIAGNOSIS — Z3201 Encounter for pregnancy test, result positive: Secondary | ICD-10-CM

## 2013-08-06 LAB — OB RESULTS CONSOLE HGB/HCT, BLOOD
HEMATOCRIT: 37 %
Hemoglobin: 12.7 g/dL

## 2013-08-06 LAB — HIV ANTIBODY (ROUTINE TESTING W REFLEX): HIV: NONREACTIVE

## 2013-08-06 LAB — OB RESULTS CONSOLE PLATELET COUNT: PLATELETS: 286 10*3/uL

## 2013-08-06 LAB — POCT PREGNANCY, URINE: Preg Test, Ur: POSITIVE — AB

## 2013-08-06 NOTE — Progress Notes (Signed)
Pt. Here today for pregnancy test. LMP 05/27/13. Pt. States her periods are usually very regular. Based off LMP pt. Is 5025w1d today. Pt. Would like to receive care here. New OB Labs today. Letter of verification given: EDD March 03, 2014. Growth and anatomy Ultrasound scheduled for October 09, 2013 at 0930. Pt. Informed.

## 2013-08-07 LAB — OBSTETRIC PANEL
Antibody Screen: NEGATIVE
BASOS ABS: 0 10*3/uL (ref 0.0–0.1)
BASOS PCT: 0 % (ref 0–1)
EOS ABS: 0.2 10*3/uL (ref 0.0–0.7)
Eosinophils Relative: 2 % (ref 0–5)
HCT: 37.4 % (ref 36.0–46.0)
HEP B S AG: NEGATIVE
Hemoglobin: 12.7 g/dL (ref 12.0–15.0)
Lymphocytes Relative: 29 % (ref 12–46)
Lymphs Abs: 2.5 10*3/uL (ref 0.7–4.0)
MCH: 27 pg (ref 26.0–34.0)
MCHC: 34 g/dL (ref 30.0–36.0)
MCV: 79.6 fL (ref 78.0–100.0)
Monocytes Absolute: 0.6 10*3/uL (ref 0.1–1.0)
Monocytes Relative: 7 % (ref 3–12)
NEUTROS PCT: 62 % (ref 43–77)
Neutro Abs: 5.3 10*3/uL (ref 1.7–7.7)
PLATELETS: 286 10*3/uL (ref 150–400)
RBC: 4.7 MIL/uL (ref 3.87–5.11)
RDW: 13.8 % (ref 11.5–15.5)
RH TYPE: POSITIVE
Rubella: 3.64 Index — ABNORMAL HIGH (ref <0.90)
WBC: 8.5 10*3/uL (ref 4.0–10.5)

## 2013-08-09 LAB — HEMOGLOBINOPATHY EVALUATION
HEMOGLOBIN OTHER: 0 %
HGB A2 QUANT: 1.2 % — AB (ref 2.2–3.2)
HGB A: 98.8 % — AB (ref 96.8–97.8)
HGB S QUANTITAION: 0 %
Hgb F Quant: 0 % (ref 0.0–2.0)

## 2013-08-21 ENCOUNTER — Ambulatory Visit (INDEPENDENT_AMBULATORY_CARE_PROVIDER_SITE_OTHER): Payer: Medicaid Other | Admitting: Obstetrics and Gynecology

## 2013-08-21 ENCOUNTER — Other Ambulatory Visit (HOSPITAL_COMMUNITY)
Admission: RE | Admit: 2013-08-21 | Discharge: 2013-08-21 | Disposition: A | Discharge status: Home / Self Care | Payer: Medicaid Other | Source: Ambulatory Visit | Attending: Obstetrics and Gynecology | Admitting: Obstetrics and Gynecology

## 2013-08-21 ENCOUNTER — Encounter: Payer: Self-pay | Admitting: Obstetrics and Gynecology

## 2013-08-21 VITALS — BP 141/82 | Temp 97.3°F | Ht 64.5 in | Wt 269.1 lb

## 2013-08-21 DIAGNOSIS — O3421 Maternal care for scar from previous cesarean delivery: Secondary | ICD-10-CM

## 2013-08-21 DIAGNOSIS — O09299 Supervision of pregnancy with other poor reproductive or obstetric history, unspecified trimester: Secondary | ICD-10-CM

## 2013-08-21 DIAGNOSIS — M654 Radial styloid tenosynovitis [de Quervain]: Secondary | ICD-10-CM

## 2013-08-21 DIAGNOSIS — R109 Unspecified abdominal pain: Secondary | ICD-10-CM

## 2013-08-21 DIAGNOSIS — O34219 Maternal care for unspecified type scar from previous cesarean delivery: Secondary | ICD-10-CM

## 2013-08-21 DIAGNOSIS — Z01419 Encounter for gynecological examination (general) (routine) without abnormal findings: Secondary | ICD-10-CM | POA: Insufficient documentation

## 2013-08-21 DIAGNOSIS — O0932 Supervision of pregnancy with insufficient antenatal care, second trimester: Secondary | ICD-10-CM | POA: Insufficient documentation

## 2013-08-21 DIAGNOSIS — O9989 Other specified diseases and conditions complicating pregnancy, childbirth and the puerperium: Secondary | ICD-10-CM

## 2013-08-21 DIAGNOSIS — O0930 Supervision of pregnancy with insufficient antenatal care, unspecified trimester: Secondary | ICD-10-CM

## 2013-08-21 DIAGNOSIS — Z3490 Encounter for supervision of normal pregnancy, unspecified, unspecified trimester: Secondary | ICD-10-CM

## 2013-08-21 DIAGNOSIS — Z113 Encounter for screening for infections with a predominantly sexual mode of transmission: Secondary | ICD-10-CM | POA: Insufficient documentation

## 2013-08-21 DIAGNOSIS — E669 Obesity, unspecified: Secondary | ICD-10-CM

## 2013-08-21 DIAGNOSIS — O3680X0 Pregnancy with inconclusive fetal viability, not applicable or unspecified: Secondary | ICD-10-CM

## 2013-08-21 DIAGNOSIS — O99210 Obesity complicating pregnancy, unspecified trimester: Secondary | ICD-10-CM

## 2013-08-21 DIAGNOSIS — O99212 Obesity complicating pregnancy, second trimester: Secondary | ICD-10-CM

## 2013-08-21 DIAGNOSIS — O99211 Obesity complicating pregnancy, first trimester: Secondary | ICD-10-CM | POA: Insufficient documentation

## 2013-08-21 DIAGNOSIS — O26899 Other specified pregnancy related conditions, unspecified trimester: Secondary | ICD-10-CM

## 2013-08-21 DIAGNOSIS — Z3480 Encounter for supervision of other normal pregnancy, unspecified trimester: Secondary | ICD-10-CM

## 2013-08-21 DIAGNOSIS — Z1151 Encounter for screening for human papillomavirus (HPV): Secondary | ICD-10-CM | POA: Insufficient documentation

## 2013-08-21 DIAGNOSIS — I10 Essential (primary) hypertension: Secondary | ICD-10-CM

## 2013-08-21 DIAGNOSIS — Z23 Encounter for immunization: Secondary | ICD-10-CM

## 2013-08-21 LAB — POCT URINALYSIS DIP (DEVICE)
BILIRUBIN URINE: NEGATIVE
GLUCOSE, UA: NEGATIVE mg/dL
HGB URINE DIPSTICK: NEGATIVE
KETONES UR: NEGATIVE mg/dL
Leukocytes, UA: NEGATIVE
NITRITE: NEGATIVE
Protein, ur: NEGATIVE mg/dL
Specific Gravity, Urine: 1.025 (ref 1.005–1.030)
Urobilinogen, UA: 0.2 mg/dL (ref 0.0–1.0)
pH: 5.5 (ref 5.0–8.0)

## 2013-08-21 LAB — US OB COMP LESS 14 WKS

## 2013-08-21 MED ORDER — PRENATAL PLUS 27-1 MG PO TABS: 1.0000 | ORAL_TABLET | Freq: Every day | ORAL | Status: DC

## 2013-08-21 NOTE — Patient Instructions (Signed)
Hypertension During Pregnancy Hypertension is also called high blood pressure. It can occur at any time in life and during pregnancy. When you have hypertension, there is extra pressure inside your blood vessels that carry blood from the heart to the rest of your body (arteries). Hypertension during pregnancy can cause problems for you and your baby. Your baby might not weigh as much as it should at birth or might be born early (premature). Very bad cases of hypertension during pregnancy can be life threatening.  Different types of hypertension can occur during pregnancy.   Chronic hypertension. This happens when a woman has hypertension before pregnancy and it continues during pregnancy.  Gestational hypertension. This is when hypertension develops during pregnancy.  Preeclampsia or toxemia of pregnancy. This is a very serious type of hypertension that develops only during pregnancy. It is a disease that affects the whole body (systemic) and can be very dangerous for both mother and baby.  Gestational hypertension and preeclampsia usually go away after your baby is born. Blood pressure generally stabilizes within 6 weeks. Women who have hypertension during pregnancy have a greater chance of developing hypertension later in life or with future pregnancies. RISK FACTORS Some factors make you more likely to develop hypertension during pregnancy. Risk factors include:  Having hypertension before pregnancy.  Having hypertension during a previous pregnancy.  Being overweight.  Being older than 40.  Being pregnant with more than one baby (multiples).  Having diabetes or kidney problems. SIGNS AND SYMPTOMS Chronic and gestational hypertension rarely cause symptoms. Preeclampsia has symptoms, which may include:  Increased protein in your urine. Your health care provider will check for this at every prenatal visit.  Swelling of your hands and face.  Rapid weight gain.  Headaches.  Visual  changes.  Being bothered by light.  Abdominal pain, especially in the right upper area.  Chest pain.  Shortness of breath.  Increased reflexes.  Seizures. Seizures occur with a more severe form of preeclampsia, called eclampsia. DIAGNOSIS   You may be diagnosed with hypertension during a regular prenatal exam. At each visit, tests may include:  Blood pressure checks.  A urine test to check for protein in your urine.  The type of hypertension you are diagnosed with depends on when you developed it. It also depends on your specific blood pressure reading.  Developing hypertension before 20 weeks of pregnancy is consistent with chronic hypertension.  Developing hypertension after 20 weeks of pregnancy is consistent with gestational hypertension.  Hypertension with increased urinary protein is diagnosed as preeclampsia.  Blood pressure measurements that stay above 160 systolic or 110 diastolic are a sign of severe preeclampsia. TREATMENT Treatment for hypertension during pregnancy varies. Treatment depends on the type of hypertension and how serious it is.  If you take medicine for chronic hypertension, you may need to switch medicines.  Drugs called ACE inhibitors should not be taken during pregnancy.  Low-dose aspirin may be suggested for women who have risk factors for preeclampsia.  If you have gestational hypertension, you may need to take a blood pressure medicine that is safe during pregnancy. Your health care provider will recommend the appropriate medicine.  If you have severe preeclampsia, you may need to be in the hospital. Health care providers will watch you and the baby very closely. You also may need to take medicine (magnesium sulfate) to prevent seizures and lower blood pressure.  Sometimes an early delivery is needed. This may be the case if the condition worsens. It would   be done to protect you and the baby. The only cure for preeclampsia is delivery. HOME  CARE INSTRUCTIONS  Schedule and keep all of your regular appointments for prenatal care.  Only take over-the-counter or prescription medicines as directed by your health care provider. Tell your health care provider about all medicines you take.  Eat as little salt as possible.  Get regular exercise.  Do not drink alcohol.  Do not use tobacco products.  Do not drink products with caffeine.  Lie on your left side when resting. SEEK IMMEDIATE MEDICAL CARE IF:  You have severe abdominal pain.  You have sudden swelling in the hands, ankles, or face.  You gain 4 pounds (1.8 kg) or more in 1 week.  You vomit repeatedly.  You have vaginal bleeding.  You do not feel the baby moving as much.  You have a headache.  You have blurred or double vision.  You have muscle twitching or spasms.  You have shortness of breath.  You have blue fingernails and lips.  You have blood in your urine. MAKE SURE YOU:  Understand these instructions.  Will watch your condition.  Will get help right away if you are not doing well or get worse. Document Released: 02/15/2011 Document Revised: 03/20/2013 Document Reviewed: 12/27/2012 Exit[employer] Patient Information 2014 [employer].  

## 2013-08-21 NOTE — Progress Notes (Signed)
Hx CHTN on Lisinopril x2 years, no meds x 3-4 months. Followed at [facility].   Subjective:    Caitlyn [name] is a [id number] at [redacted]w[redacted]d by LMP being seen today for her first obstetrical visit.  Her obstetrical history is significant for previous LVCS for NRFHR. Patient does intend to breast feed. Pregnancy history fully reviewed.  Patient reports nausea, no contractions and no cramping.  Filed Vitals:   08/21/13 0836 08/21/13 0853  BP:  141/82  Temp:  97.3 F (36.3 C)  Height: 5' 4.5" (1.638 m)   Weight:  269 lb 1.6 oz (122.063 kg)    HISTORY: OB History  Gravida Para Term Preterm AB SAB TAB Ectopic Multiple Living  5 3 3  1 1    3     # Outcome Date GA Lbr Len/2nd Weight Sex Delivery Anes PTL Lv  5 CUR           4 TRM 03/2010 [redacted]w[redacted]d  9 lb 2 oz (4.139 kg) F LVCS EPI  Y     Comments: had edema during and after pregnancy, emergency c/s due to fetal distress  3 SAB 2010          2 TRM 05/2007 [redacted]w[redacted]d  7 lb 2 oz (3.232 kg) M SVD None  Y     Comments: had edema, but no bp issues, born at [facility], [place], [place]  1 [facility] 08/2002 [redacted]w[redacted]d  8 lb 2 oz (3.685 kg) F SVD None  Y     Comments: postdates,baby had to be resuscitated and had preterm symptoms., born at [facility] in [place] , [place]     Past Medical History  Diagnosis Date  . Hypertension    Past Surgical History  Procedure Laterality Date  . Cesarean section  03/13/2010   History reviewed. No pertinent family history.   Exam    Uterus:   unable to outline due to body habitus  Pelvic Exam:    Perineum: No Hemorrhoids, Normal Perineum   Vulva: normal, Bartholin's, Urethra, Skene's normal   Vagina:  normal mucosa, normal discharge       Cervix: no bleeding following Pap and no lesions   Adnexa: not evaluated   Bony Pelvis: average  System: Breast:  normal appearance, no masses or tenderness   Skin: normal coloration and turgor, no rashes    Neurologic: oriented, normal, grossly non-focal   Extremities: normal  strength, tone, and muscle mass   HEENT PERRLA   Mouth/Teeth mucous membranes moist, pharynx normal without lesions   Neck supple and no masses   Cardiovascular: regular rate and rhythm, no murmurs or gallops   Respiratory:  appears well, vitals normal, no respiratory distress, acyanotic, normal RR, ear and throat exam is normal, neck free of mass or lymphadenopathy, chest clear, no wheezing, crepitations, rhonchi, normal symmetric air entry   Abdomen: soft, non-tender; bowel sounds normal; no masses,  no organomegaly   Urinary: urethral meatus normal      Assessment:    Pregnancy: [id number] at [redacted]w[redacted]d  Patient Active Problem List   Diagnosis Date Noted  . Hx of preeclampsia, prior pregnancy, currently pregnant 08/21/2013  . Previous cesarean section complicating pregnancy 08/21/2013  . Hypertension 08/21/2013  . Obesity complicating pregnancy in first trimester 08/21/2013  . 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 Early glucola done Prenatal vitamins. Problem list reviewed and updated. Genetic Screening discussed Quad Screen: declined.  Ultrasound discussed;  fetal survey: requested.  Follow up in 3 weeks. 50% of 30 min visit spent on counseling and coordination of care.    Kowen Kluth 08/21/2013

## 2013-08-21 NOTE — Progress Notes (Signed)
P= 87, Here for initial visit. States stopped taking lisinopril /hctz 2-3 months ago because was seeing spots, was on it for a while and doctor told her to stop it if problems. States lately having some headaches and edema in feet. C/o cramping upper abdomen all day yesterday but denies vaginal bleeding. Given new patient information. Discussed bmi/ appropriate weight gain.

## 2013-08-21 NOTE — Progress Notes (Signed)
Bedside US for FHR = 166 bpm per PW doppler.  Fetal movement observed.  Deirdre Poe CNM notified.

## 2013-08-22 LAB — COMPREHENSIVE METABOLIC PANEL
ALBUMIN: 4.1 g/dL (ref 3.5–5.2)
ALK PHOS: 37 U/L — AB (ref 39–117)
ALT: 14 U/L (ref 0–35)
AST: 14 U/L (ref 0–37)
BUN: 6 mg/dL (ref 6–23)
CO2: 24 mEq/L (ref 19–32)
CREATININE: 0.57 mg/dL (ref 0.50–1.10)
Calcium: 9.3 mg/dL (ref 8.4–10.5)
Chloride: 100 mEq/L (ref 96–112)
Glucose, Bld: 191 mg/dL — ABNORMAL HIGH (ref 70–99)
POTASSIUM: 4.3 meq/L (ref 3.5–5.3)
Sodium: 134 mEq/L — ABNORMAL LOW (ref 135–145)
Total Bilirubin: 0.4 mg/dL (ref 0.2–1.2)
Total Protein: 7 g/dL (ref 6.0–8.3)

## 2013-08-22 LAB — GLUCOSE TOLERANCE, 1 HOUR (50G) W/O FASTING: Glucose, 1 Hour GTT: 188 mg/dL — ABNORMAL HIGH (ref 70–140)

## 2013-09-05 ENCOUNTER — Encounter: Payer: Self-pay | Admitting: Family Medicine

## 2013-09-05 ENCOUNTER — Ambulatory Visit (INDEPENDENT_AMBULATORY_CARE_PROVIDER_SITE_OTHER): Payer: Medicaid Other | Admitting: Family Medicine

## 2013-09-05 ENCOUNTER — Telehealth: Payer: Self-pay

## 2013-09-05 VITALS — BP 125/83 | Temp 98.0°F | Wt 273.0 lb

## 2013-09-05 DIAGNOSIS — E669 Obesity, unspecified: Secondary | ICD-10-CM

## 2013-09-05 DIAGNOSIS — O99210 Obesity complicating pregnancy, unspecified trimester: Secondary | ICD-10-CM

## 2013-09-05 DIAGNOSIS — I10 Essential (primary) hypertension: Secondary | ICD-10-CM

## 2013-09-05 DIAGNOSIS — O0990 Supervision of high risk pregnancy, unspecified, unspecified trimester: Secondary | ICD-10-CM

## 2013-09-05 DIAGNOSIS — Z8679 Personal history of other diseases of the circulatory system: Secondary | ICD-10-CM

## 2013-09-05 DIAGNOSIS — O99211 Obesity complicating pregnancy, first trimester: Secondary | ICD-10-CM

## 2013-09-05 LAB — POCT URINALYSIS DIP (DEVICE)
Bilirubin Urine: NEGATIVE
Glucose, UA: NEGATIVE mg/dL
Hgb urine dipstick: NEGATIVE
KETONES UR: NEGATIVE mg/dL
Leukocytes, UA: NEGATIVE
Nitrite: NEGATIVE
PH: 5 (ref 5.0–8.0)
PROTEIN: NEGATIVE mg/dL
Urobilinogen, UA: 0.2 mg/dL (ref 0.0–1.0)

## 2013-09-05 MED ORDER — ASPIRIN EC 81 MG PO TBEC: 81.0000 mg | DELAYED_RELEASE_TABLET | Freq: Every day | ORAL | Status: DC

## 2013-09-05 NOTE — Progress Notes (Signed)
Pulse 78   Edema in feet.

## 2013-09-05 NOTE — Telephone Encounter (Signed)
Message copied by Faythe CasaBELLAMY, Kalena Mander M on Thu Sep 05, 2013  5:01 PM ------      Message from: Jolyn LentDOM, MICHAEL R      Created: Thu Sep 05, 2013  1:16 PM       Please call pt and inform with her history of Preeclampsia I would like her to start a baby aspirin daily. Rx already written            Cheers,      MRO ------

## 2013-09-05 NOTE — Progress Notes (Addendum)
Hx of elevated blood pressure - 24hr Urine PRotein, already have CBC, and CMP, start Aspirin with hx of Preeclampsia No LOF, no VB, no Ctx No FM  Elevated glucola - 3hr needed  Caitlyn [name] is a 33 y.o. [id number] at 5677w3d by LMP presents for ROB  Discussed with Patient:  - ordered genetics screen (Quad screen  - Patient plans on bottle feeding. - Routine precautions discussed (depression, infection s/s).   Patient provided with all pertinent phone numbers for emergencies. - RTC for any VB, regular, painful cramps/ctxs occurring at a rate of >2/10 min, fever (100.5 or higher), n/v/d, any pain that is unresolving or worsening. - RTC in 4 weeks for next appt.  Problems: Patient Active Problem List   Diagnosis Date Noted  . Hx of preeclampsia, prior pregnancy, currently pregnant 08/21/2013  . Previous cesarean section complicating pregnancy 08/21/2013  . Hypertension 08/21/2013  . Obesity complicating pregnancy in first trimester 08/21/2013  . 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 18 week anatomy scan ordered.  Patient to schedule.  [ ]  Vaccines: ZHY:QMVHQIOFlu:decline  Tdap:  [x ] BCM: mirena  Edu: [x ] PTL precautions; [ ]  BF class; [ ]  childbirth class; [ ]   BF counseling

## 2013-09-05 NOTE — Patient Instructions (Signed)
Second Trimester of Pregnancy The second trimester is from week 13 through week 28, months 4 through 6. The second trimester is often a time when you feel your best. Your body has also adjusted to being pregnant, and you begin to feel better physically. Usually, morning sickness has lessened or quit completely, you may have more energy, and you may have an increase in appetite. The second trimester is also a time when the fetus is growing rapidly. At the end of the sixth month, the fetus is about 9 inches long and weighs about 1 pounds. You will likely begin to feel the baby move (quickening) between 18 and 20 weeks of the pregnancy. BODY CHANGES Your body goes through many changes during pregnancy. The changes vary from woman to woman.   Your weight will continue to increase. You will notice your lower abdomen bulging out.  You may begin to get stretch marks on your hips, abdomen, and breasts.  You may develop headaches that can be relieved by medicines approved by your caregiver.  You may urinate more often because the fetus is pressing on your bladder.  You may develop or continue to have heartburn as a result of your pregnancy.  You may develop constipation because certain hormones are causing the muscles that push waste through your intestines to slow down.  You may develop hemorrhoids or swollen, bulging veins (varicose veins).  You may have back pain because of the weight gain and pregnancy hormones relaxing your joints between the bones in your pelvis and as a result of a shift in weight and the muscles that support your balance.  Your breasts will continue to grow and be tender.  Your gums may bleed and may be sensitive to brushing and flossing.  Dark spots or blotches (chloasma, mask of pregnancy) may develop on your face. This will likely fade after the baby is born.  A dark line from your belly button to the pubic area (linea nigra) may appear. This will likely fade after the  baby is born. WHAT TO EXPECT AT YOUR PRENATAL VISITS During a routine prenatal visit:  You will be weighed to make sure you and the fetus are growing normally.  Your blood pressure will be taken.  Your abdomen will be measured to track your baby's growth.  The fetal heartbeat will be listened to.  Any test results from the previous visit will be discussed. Your caregiver may ask you:  How you are feeling.  If you are feeling the baby move.  If you have had any abnormal symptoms, such as leaking fluid, bleeding, severe headaches, or abdominal cramping.  If you have any questions. Other tests that may be performed during your second trimester include:  Blood tests that check for:  Low iron levels (anemia).  Gestational diabetes (between 24 and 28 weeks).  Rh antibodies.  Urine tests to check for infections, diabetes, or protein in the urine.  An ultrasound to confirm the proper growth and development of the baby.  An amniocentesis to check for possible genetic problems.  Fetal screens for spina bifida and Down syndrome. HOME CARE INSTRUCTIONS   Avoid all smoking, herbs, alcohol, and unprescribed drugs. These chemicals affect the formation and growth of the baby.  Follow your caregiver's instructions regarding medicine use. There are medicines that are either safe or unsafe to take during pregnancy.  Exercise only as directed by your caregiver. Experiencing uterine cramps is a good sign to stop exercising.  Continue to eat regular,   healthy meals.  Wear a good support bra for breast tenderness.  Do not use hot tubs, steam rooms, or saunas.  Wear your seat belt at all times when driving.  Avoid raw meat, uncooked cheese, cat litter boxes, and soil used by cats. These carry germs that can cause birth defects in the baby.  Take your prenatal vitamins.  Try taking a stool softener (if your caregiver approves) if you develop constipation. Eat more high-fiber foods,  such as fresh vegetables or fruit and whole grains. Drink plenty of fluids to keep your urine clear or pale yellow.  Take warm sitz baths to soothe any pain or discomfort caused by hemorrhoids. Use hemorrhoid cream if your caregiver approves.  If you develop varicose veins, wear support hose. Elevate your feet for 15 minutes, 3 4 times a day. Limit salt in your diet.  Avoid heavy lifting, wear low heel shoes, and practice good posture.  Rest with your legs elevated if you have leg cramps or low back pain.  Visit your dentist if you have not gone yet during your pregnancy. Use a soft toothbrush to brush your teeth and be gentle when you floss.  A sexual relationship may be continued unless your caregiver directs you otherwise.  Continue to go to all your prenatal visits as directed by your caregiver. SEEK MEDICAL CARE IF:   You have dizziness.  You have mild pelvic cramps, pelvic pressure, or nagging pain in the abdominal area.  You have persistent nausea, vomiting, or diarrhea.  You have a bad smelling vaginal discharge.  You have pain with urination. SEEK IMMEDIATE MEDICAL CARE IF:   You have a fever.  You are leaking fluid from your vagina.  You have spotting or bleeding from your vagina.  You have severe abdominal cramping or pain.  You have rapid weight gain or loss.  You have shortness of breath with chest pain.  You notice sudden or extreme swelling of your face, hands, ankles, feet, or legs.  You have not felt your baby move in over an hour.  You have severe headaches that do not go away with medicine.  You have vision changes. Document Released: 05/24/2001 Document Revised: 01/30/2013 Document Reviewed: 07/31/2012 [employer] Patient Information 2014 [employer].  

## 2013-09-05 NOTE — Telephone Encounter (Signed)
Attempted to call pt and was unable to leave message due to phone not ringing.

## 2013-09-05 NOTE — Addendum Note (Signed)
Addended by: Minta BalsamDOM, Laurren Lepkowski R on: 09/05/2013 01:17 PM   Modules accepted: Orders

## 2013-09-06 LAB — AFP, QUAD SCREEN
AFP: 20.9 IU/mL
CURR GEST AGE: 14.3 wks.days
HCG, Total: 25155 m[IU]/mL
INH: 160 pg/mL
Interpretation-AFP: NEGATIVE
MOM FOR INH: 1.03
MoM for AFP: 1.17
MoM for hCG: 1.04
Open Spina bifida: NEGATIVE
Tri 18 Scr Risk Est: NEGATIVE
uE3 Mom: 1.42
uE3 Value: 0.3 ng/mL

## 2013-09-07 ENCOUNTER — Encounter: Payer: Self-pay | Admitting: Family Medicine

## 2013-09-09 ENCOUNTER — Other Ambulatory Visit: Payer: Medicaid Other

## 2013-09-09 ENCOUNTER — Other Ambulatory Visit: Payer: Self-pay | Admitting: *Deleted

## 2013-09-09 ENCOUNTER — Encounter: Payer: Self-pay | Admitting: *Deleted

## 2013-09-09 DIAGNOSIS — Z8679 Personal history of other diseases of the circulatory system: Secondary | ICD-10-CM

## 2013-09-09 DIAGNOSIS — Z3480 Encounter for supervision of other normal pregnancy, unspecified trimester: Secondary | ICD-10-CM

## 2013-09-09 NOTE — Telephone Encounter (Signed)
Called Caitlyn [name]'s home/mobile number - heard message number is not correct. Called contact number- is a non-working number.  Will send letter.

## 2013-09-09 NOTE — Addendum Note (Signed)
Addended by: Aldona LentoFISHER, Khoen Genet L on: 09/09/2013 11:57 AM   Modules accepted: Orders

## 2013-09-10 ENCOUNTER — Telehealth: Payer: Self-pay | Admitting: Family Medicine

## 2013-09-10 ENCOUNTER — Encounter: Payer: Self-pay | Admitting: *Deleted

## 2013-09-10 ENCOUNTER — Encounter: Payer: Self-pay | Admitting: Family Medicine

## 2013-09-10 DIAGNOSIS — O24919 Unspecified diabetes mellitus in pregnancy, unspecified trimester: Secondary | ICD-10-CM | POA: Insufficient documentation

## 2013-09-10 DIAGNOSIS — IMO0001 Reserved for inherently not codable concepts without codable children: Secondary | ICD-10-CM

## 2013-09-10 DIAGNOSIS — O24319 Unspecified pre-existing diabetes mellitus in pregnancy, unspecified trimester: Principal | ICD-10-CM

## 2013-09-10 LAB — COMPREHENSIVE METABOLIC PANEL
ALT: 14 U/L (ref 0–35)
AST: 15 U/L (ref 0–37)
Albumin: 3.6 g/dL (ref 3.5–5.2)
Alkaline Phosphatase: 40 U/L (ref 39–117)
BILIRUBIN TOTAL: 0.3 mg/dL (ref 0.2–1.2)
BUN: 6 mg/dL (ref 6–23)
CALCIUM: 9.2 mg/dL (ref 8.4–10.5)
CHLORIDE: 97 meq/L (ref 96–112)
CO2: 26 meq/L (ref 19–32)
Creat: 0.54 mg/dL (ref 0.50–1.10)
Glucose, Bld: 209 mg/dL — ABNORMAL HIGH (ref 70–99)
Potassium: 4.2 mEq/L (ref 3.5–5.3)
SODIUM: 132 meq/L — AB (ref 135–145)
TOTAL PROTEIN: 6.6 g/dL (ref 6.0–8.3)

## 2013-09-10 LAB — GLUCOSE TOLERANCE, 3 HOURS
GLUCOSE 3 HOUR GTT: 135 mg/dL (ref 70–144)
Glucose Tolerance, 1 hour: 211 mg/dL — ABNORMAL HIGH (ref 70–189)
Glucose Tolerance, 2 hour: 192 mg/dL — ABNORMAL HIGH (ref 70–164)
Glucose Tolerance, Fasting: 107 mg/dL — ABNORMAL HIGH (ref 70–104)

## 2013-09-10 LAB — PROTEIN, URINE, 24 HOUR
Protein, 24H Urine: 71 mg/d (ref 50–100)
Protein, Urine: 3 mg/dL

## 2013-09-10 LAB — CREATININE CLEARANCE, URINE, 24 HOUR
CREATININE 24H UR: 2423 mg/d — AB (ref 700–1800)
CREATININE, URINE: 102 mg/dL
Creatinine Clearance: 312 mL/min — ABNORMAL HIGH (ref 75–115)
Creatinine: 0.54 mg/dL (ref 0.50–1.10)

## 2013-09-10 NOTE — Telephone Encounter (Signed)
Appointment with Dr. Wanita [facility] on 4/27 @ 1115.  Numbers listed are invalid, certified letter sent with all appointments listed.

## 2013-09-16 ENCOUNTER — Encounter: Payer: Self-pay | Admitting: Family Medicine

## 2013-09-16 ENCOUNTER — Ambulatory Visit: Payer: Medicaid Other | Admitting: *Deleted

## 2013-09-16 DIAGNOSIS — O24319 Unspecified pre-existing diabetes mellitus in pregnancy, unspecified trimester: Principal | ICD-10-CM

## 2013-09-16 DIAGNOSIS — IMO0001 Reserved for inherently not codable concepts without codable children: Secondary | ICD-10-CM

## 2013-09-16 NOTE — Progress Notes (Signed)
Nutrition note: 1st consult & GDM diet education Pt is newly diagnosed GDM pt & has h/o obesity. Pt has gained 17# @ 16w, which is > expected. Pt reports eating 3 meals & 1-2 snacks (fruit)/d. Pt is taking a PNV. Pt reports nausea & heartburn occ. NKFA. Pt reports walking occ. Pt received verbal & written education on GDM diet. Discussed tips to decrease nausea & heartburn. Discussed importance of BF. Discussed wt gain goals of 11-20# or 0.5#/wk. Pt agrees to follow GDM diet with 3 meals & 2-3 snacks/d with proper CHO/ protein combination. Pt does not have WIC but [place]. Pt does not plan to BF. F/u in 4-6 wks Caitlyn RevealLaura Delita Chiquito, MS, RD, LDN

## 2013-09-17 ENCOUNTER — Encounter: Payer: Self-pay | Admitting: Family Medicine

## 2013-09-17 LAB — TSH: TSH: 0.906 u[IU]/mL (ref 0.350–4.500)

## 2013-09-17 LAB — HEMOGLOBIN A1C
HEMOGLOBIN A1C: 6.7 % — AB (ref <5.7)
Mean Plasma Glucose: 146 mg/dL — ABNORMAL HIGH (ref <117)

## 2013-10-01 ENCOUNTER — Telehealth: Payer: Self-pay

## 2013-10-01 DIAGNOSIS — O99810 Abnormal glucose complicating pregnancy: Secondary | ICD-10-CM

## 2013-10-01 MED ORDER — ACCU-CHEK FASTCLIX LANCETS MISC: 1.0000 | Freq: Four times a day (QID) | Status: DC

## 2013-10-01 MED ORDER — GLUCOSE BLOOD VI STRP: ORAL_STRIP | Status: DC

## 2013-10-01 NOTE — Telephone Encounter (Signed)
Patient called back stating [facility] and was calling us about her test strips. She doesn't get off work till 3:30 so she will be able to talk to someone after that. Called patient and asked what meter she received from us. Patient stated that she wasn't sure but it was a black meter and she thought it was called one touch. Told patient I will send the strips and lancets she needs to her pharmacy. Patient verbalized understanding and had no further questions

## 2013-10-01 NOTE — Telephone Encounter (Signed)
Pt. Called stating she was supposed to have strips and needles called in a month ago but [place] says they do not have them. Attempted to call pt. No answer. Left message stating we are returning your call, please call clinic.

## 2013-10-03 ENCOUNTER — Telehealth: Payer: Self-pay

## 2013-10-03 ENCOUNTER — Ambulatory Visit (INDEPENDENT_AMBULATORY_CARE_PROVIDER_SITE_OTHER): Payer: Medicaid Other | Admitting: Obstetrics & Gynecology

## 2013-10-03 ENCOUNTER — Encounter: Payer: Self-pay | Admitting: Obstetrics & Gynecology

## 2013-10-03 VITALS — BP 141/93 | HR 82 | Temp 97.7°F | Wt 271.2 lb

## 2013-10-03 DIAGNOSIS — Z23 Encounter for immunization: Secondary | ICD-10-CM

## 2013-10-03 DIAGNOSIS — O99810 Abnormal glucose complicating pregnancy: Secondary | ICD-10-CM

## 2013-10-03 DIAGNOSIS — IMO0001 Reserved for inherently not codable concepts without codable children: Secondary | ICD-10-CM

## 2013-10-03 LAB — POCT URINALYSIS DIP (DEVICE)
BILIRUBIN URINE: NEGATIVE
Glucose, UA: NEGATIVE mg/dL
HGB URINE DIPSTICK: NEGATIVE
Ketones, ur: NEGATIVE mg/dL
Leukocytes, UA: NEGATIVE
NITRITE: NEGATIVE
PH: 5.5 (ref 5.0–8.0)
Protein, ur: NEGATIVE mg/dL
Urobilinogen, UA: 0.2 mg/dL (ref 0.0–1.0)

## 2013-10-03 LAB — GLUCOSE, CAPILLARY: GLUCOSE-CAPILLARY: 74 mg/dL (ref 70–99)

## 2013-10-03 MED ORDER — LABETALOL HCL 100 MG PO TABS: 100.0000 mg | ORAL_TABLET | Freq: Two times a day (BID) | ORAL | Status: DC

## 2013-10-03 NOTE — Telephone Encounter (Signed)
Pt called and stated that strips she received from her pharmacy is too big for her machine. Called pt pharmacy and was informed by the techician that she has picked up strips and lancet but has not been back to the pharmacy. Called pt and left message that we are returning her call and that our office is currently closed to call first thing in the am @ 0800.

## 2013-10-03 NOTE — Progress Notes (Signed)
Pt has not been checking CBGs, just picked up lancets yesterday.  Fasting today is 74 BP slightly elevated will start labetalol 100 bid.

## 2013-10-03 NOTE — Progress Notes (Signed)
Pt. States she has not been checking her sugars as she was just able to get strips and lancets; nancy did not send them in to pharmacy after last visit. Pt. Was also not aware she needed to start 81mg  aspirin-- states she will pick prescription up today. Pt. Used to be on BP medications, stopped 6 months before she was pregnant and just started changing diet though developed headache yesterday and thought her BP was up; BP elevated today and questions whether she needs to start back up on BP medication (HCTZ combination pill).   Edema in feet/ankles. Braxton hicks.

## 2013-10-07 ENCOUNTER — Telehealth: Payer: Self-pay | Admitting: *Deleted

## 2013-10-07 ENCOUNTER — Other Ambulatory Visit: Payer: Self-pay

## 2013-10-07 ENCOUNTER — Telehealth: Payer: Self-pay

## 2013-10-07 DIAGNOSIS — O99810 Abnormal glucose complicating pregnancy: Secondary | ICD-10-CM

## 2013-10-07 DIAGNOSIS — IMO0001 Reserved for inherently not codable concepts without codable children: Secondary | ICD-10-CM

## 2013-10-07 MED ORDER — ACCU-CHEK FASTCLIX LANCETS MISC: 1.0000 | Freq: Four times a day (QID) | Status: DC

## 2013-10-07 MED ORDER — GLUCOSE BLOOD VI STRP: ORAL_STRIP | Status: DC

## 2013-10-07 NOTE — Telephone Encounter (Signed)
Pt. Called and stated the diabetic strips she got are too big for her meter. Attempted to call pt, spoke to fiance who stated pt. Was at work but that he knew she had called about the strips because she had tried to have the pharmacy swap out for the correct strips and the pharm tech informed her her insurance would not cover the strips. Informed fiance I would call pharmacy and get back to pt.

## 2013-10-07 NOTE — Telephone Encounter (Signed)
Caitlyn [name] called and left a message she is calling about her diabetic test strips. States she spoke with a nurse earlier who said she was going to call my pharmacy, but I called and they told me no one called them . They also told me to tell you to send a new prescription for a different meter- the one you gave me is not covered by medicaid. Per chart review nurse called [facility] this am. [name] and she states she has medicaid and had medicaid when she was seen by diabetic nurse in clinic and given meter. Was given a accucheck nano, but states [facility] told her she needs a aviva plus.  Informed patient we usually are told by pharmacies that medicaid covers accucheck nano and that is the meter we usually give tomedicaid patients.  [facility] [place] strips. I informed Her I would sent prescription before I leave today.

## 2013-10-07 NOTE — Telephone Encounter (Signed)
Prescription written to be approved by medicaid. Called pharmacy to ensure it was the correct order. Pharmacist stated it was. Called pt. And informed her fiance that she should not have a problem picking up correct strips. Fiance verbalized understanding and stated he would let Caitlyn [name] know.

## 2013-10-09 ENCOUNTER — Telehealth: Payer: Self-pay | Admitting: *Deleted

## 2013-10-09 ENCOUNTER — Ambulatory Visit (HOSPITAL_COMMUNITY)
Admission: RE | Admit: 2013-10-09 | Discharge: 2013-10-09 | Disposition: A | Discharge status: Home / Self Care | Payer: Medicaid Other | Source: Ambulatory Visit | Attending: Obstetrics & Gynecology | Admitting: Obstetrics & Gynecology

## 2013-10-09 ENCOUNTER — Other Ambulatory Visit: Payer: Self-pay | Admitting: Obstetrics & Gynecology

## 2013-10-09 DIAGNOSIS — O10019 Pre-existing essential hypertension complicating pregnancy, unspecified trimester: Secondary | ICD-10-CM | POA: Insufficient documentation

## 2013-10-09 DIAGNOSIS — O99810 Abnormal glucose complicating pregnancy: Secondary | ICD-10-CM

## 2013-10-09 DIAGNOSIS — O34219 Maternal care for unspecified type scar from previous cesarean delivery: Secondary | ICD-10-CM | POA: Insufficient documentation

## 2013-10-09 DIAGNOSIS — Z3689 Encounter for other specified antenatal screening: Secondary | ICD-10-CM | POA: Insufficient documentation

## 2013-10-09 DIAGNOSIS — Z3201 Encounter for pregnancy test, result positive: Secondary | ICD-10-CM

## 2013-10-09 DIAGNOSIS — O99210 Obesity complicating pregnancy, unspecified trimester: Principal | ICD-10-CM

## 2013-10-09 DIAGNOSIS — E669 Obesity, unspecified: Secondary | ICD-10-CM | POA: Insufficient documentation

## 2013-10-09 DIAGNOSIS — O99211 Obesity complicating pregnancy, first trimester: Secondary | ICD-10-CM

## 2013-10-09 MED ORDER — ACCU-CHEK FASTCLIX LANCETS MISC: Status: DC

## 2013-10-09 NOTE — Telephone Encounter (Signed)
Pt left message stating that [facility] has informed her that Accuchek Nano supplies are not covered by Medicaid.  She is requesting orders to be sent for the Accuchek Aviva machine.  I called pt and left message that the supplies are covered. I will call the pharmacy and provide information needed in order to remedy the problem.  I called and spoke with Tamica. She was able to make changes so that the Rx for supplies for Accuchek Nano machine would go through.

## 2013-10-10 ENCOUNTER — Ambulatory Visit (INDEPENDENT_AMBULATORY_CARE_PROVIDER_SITE_OTHER): Payer: Medicaid Other | Admitting: Family Medicine

## 2013-10-10 VITALS — BP 139/87 | HR 83 | Temp 98.6°F | Wt 269.3 lb

## 2013-10-10 DIAGNOSIS — O99211 Obesity complicating pregnancy, first trimester: Secondary | ICD-10-CM

## 2013-10-10 DIAGNOSIS — O99210 Obesity complicating pregnancy, unspecified trimester: Secondary | ICD-10-CM

## 2013-10-10 DIAGNOSIS — IMO0001 Reserved for inherently not codable concepts without codable children: Secondary | ICD-10-CM

## 2013-10-10 DIAGNOSIS — O99810 Abnormal glucose complicating pregnancy: Secondary | ICD-10-CM

## 2013-10-10 DIAGNOSIS — I10 Essential (primary) hypertension: Secondary | ICD-10-CM

## 2013-10-10 DIAGNOSIS — E669 Obesity, unspecified: Secondary | ICD-10-CM

## 2013-10-10 LAB — POCT URINALYSIS DIP (DEVICE)
BILIRUBIN URINE: NEGATIVE
GLUCOSE, UA: NEGATIVE mg/dL
Hgb urine dipstick: NEGATIVE
Ketones, ur: NEGATIVE mg/dL
LEUKOCYTES UA: NEGATIVE
Nitrite: NEGATIVE
Protein, ur: NEGATIVE mg/dL
Specific Gravity, Urine: 1.02 (ref 1.005–1.030)
Urobilinogen, UA: 0.2 mg/dL (ref 0.0–1.0)
pH: 6 (ref 5.0–8.0)

## 2013-10-10 NOTE — Progress Notes (Signed)
Patient reports pain with occasional contractions, states she has been having these since she was 2 months pregnant

## 2013-10-10 NOTE — Progress Notes (Signed)
S: 33 yo [id number] here for 945w3d visit  - having some irregular braxton-hicks contractions - no vb, lof.  - diabetes- just going to pick up strips this AM. Hasn't checked her sugars. Had trouble getting a meter covered    O: see flowsheet  A/P - BP improved with 100 of labetalol- cont - discussed FSBG - f/u in 1 week for visit to recheck and see if medication is needed for DM - discussed bleeding precautions.

## 2013-10-10 NOTE — Patient Instructions (Signed)
Second Trimester of Pregnancy The second trimester is from week 13 through week 28, months 4 through 6. The second trimester is often a time when you feel your best. Your body has also adjusted to being pregnant, and you begin to feel better physically. Usually, morning sickness has lessened or quit completely, you may have more energy, and you may have an increase in appetite. The second trimester is also a time when the fetus is growing rapidly. At the end of the sixth month, the fetus is about 9 inches long and weighs about 1 pounds. You will likely begin to feel the baby move (quickening) between 18 and 20 weeks of the pregnancy. BODY CHANGES Your body goes through many changes during pregnancy. The changes vary from woman to woman.   Your weight will continue to increase. You will notice your lower abdomen bulging out.  You may begin to get stretch marks on your hips, abdomen, and breasts.  You may develop headaches that can be relieved by medicines approved by your caregiver.  You may urinate more often because the fetus is pressing on your bladder.  You may develop or continue to have heartburn as a result of your pregnancy.  You may develop constipation because certain hormones are causing the muscles that push waste through your intestines to slow down.  You may develop hemorrhoids or swollen, bulging veins (varicose veins).  You may have back pain because of the weight gain and pregnancy hormones relaxing your joints between the bones in your pelvis and as a result of a shift in weight and the muscles that support your balance.  Your breasts will continue to grow and be tender.  Your gums may bleed and may be sensitive to brushing and flossing.  Dark spots or blotches (chloasma, mask of pregnancy) may develop on your face. This will likely fade after the baby is born.  A dark line from your belly button to the pubic area (linea nigra) may appear. This will likely fade after the  baby is born. WHAT TO EXPECT AT YOUR PRENATAL VISITS During a routine prenatal visit:  You will be weighed to make sure you and the fetus are growing normally.  Your blood pressure will be taken.  Your abdomen will be measured to track your baby's growth.  The fetal heartbeat will be listened to.  Any test results from the previous visit will be discussed. Your caregiver may ask you:  How you are feeling.  If you are feeling the baby move.  If you have had any abnormal symptoms, such as leaking fluid, bleeding, severe headaches, or abdominal cramping.  If you have any questions. Other tests that may be performed during your second trimester include:  Blood tests that check for:  Low iron levels (anemia).  Gestational diabetes (between 24 and 28 weeks).  Rh antibodies.  Urine tests to check for infections, diabetes, or protein in the urine.  An ultrasound to confirm the proper growth and development of the baby.  An amniocentesis to check for possible genetic problems.  Fetal screens for spina bifida and Down syndrome. HOME CARE INSTRUCTIONS   Avoid all smoking, herbs, alcohol, and unprescribed drugs. These chemicals affect the formation and growth of the baby.  Follow your caregiver's instructions regarding medicine use. There are medicines that are either safe or unsafe to take during pregnancy.  Exercise only as directed by your caregiver. Experiencing uterine cramps is a good sign to stop exercising.  Continue to eat regular,   healthy meals.  Wear a good support bra for breast tenderness.  Do not use hot tubs, steam rooms, or saunas.  Wear your seat belt at all times when driving.  Avoid raw meat, uncooked cheese, cat litter boxes, and soil used by cats. These carry germs that can cause birth defects in the baby.  Take your prenatal vitamins.  Try taking a stool softener (if your caregiver approves) if you develop constipation. Eat more high-fiber foods,  such as fresh vegetables or fruit and whole grains. Drink plenty of fluids to keep your urine clear or pale yellow.  Take warm sitz baths to soothe any pain or discomfort caused by hemorrhoids. Use hemorrhoid cream if your caregiver approves.  If you develop varicose veins, wear support hose. Elevate your feet for 15 minutes, 3 4 times a day. Limit salt in your diet.  Avoid heavy lifting, wear low heel shoes, and practice good posture.  Rest with your legs elevated if you have leg cramps or low back pain.  Visit your dentist if you have not gone yet during your pregnancy. Use a soft toothbrush to brush your teeth and be gentle when you floss.  A sexual relationship may be continued unless your caregiver directs you otherwise.  Continue to go to all your prenatal visits as directed by your caregiver. SEEK MEDICAL CARE IF:   You have dizziness.  You have mild pelvic cramps, pelvic pressure, or nagging pain in the abdominal area.  You have persistent nausea, vomiting, or diarrhea.  You have a bad smelling vaginal discharge.  You have pain with urination. SEEK IMMEDIATE MEDICAL CARE IF:   You have a fever.  You are leaking fluid from your vagina.  You have spotting or bleeding from your vagina.  You have severe abdominal cramping or pain.  You have rapid weight gain or loss.  You have shortness of breath with chest pain.  You notice sudden or extreme swelling of your face, hands, ankles, feet, or legs.  You have not felt your baby move in over an hour.  You have severe headaches that do not go away with medicine.  You have vision changes. Document Released: 05/24/2001 Document Revised: 01/30/2013 Document Reviewed: 07/31/2012 [employer] Patient Information 2014 [employer].  

## 2013-10-14 ENCOUNTER — Encounter: Payer: Self-pay | Admitting: Obstetrics & Gynecology

## 2013-10-14 ENCOUNTER — Ambulatory Visit (INDEPENDENT_AMBULATORY_CARE_PROVIDER_SITE_OTHER): Payer: Medicaid Other | Admitting: Obstetrics & Gynecology

## 2013-10-14 VITALS — BP 126/83 | HR 82 | Temp 98.1°F | Wt 268.3 lb

## 2013-10-14 DIAGNOSIS — O99210 Obesity complicating pregnancy, unspecified trimester: Secondary | ICD-10-CM

## 2013-10-14 DIAGNOSIS — O09299 Supervision of pregnancy with other poor reproductive or obstetric history, unspecified trimester: Secondary | ICD-10-CM

## 2013-10-14 DIAGNOSIS — O99211 Obesity complicating pregnancy, first trimester: Secondary | ICD-10-CM

## 2013-10-14 DIAGNOSIS — I10 Essential (primary) hypertension: Secondary | ICD-10-CM

## 2013-10-14 DIAGNOSIS — O3421 Maternal care for scar from previous cesarean delivery: Secondary | ICD-10-CM

## 2013-10-14 DIAGNOSIS — O24919 Unspecified diabetes mellitus in pregnancy, unspecified trimester: Secondary | ICD-10-CM

## 2013-10-14 DIAGNOSIS — O34219 Maternal care for unspecified type scar from previous cesarean delivery: Secondary | ICD-10-CM

## 2013-10-14 DIAGNOSIS — E119 Type 2 diabetes mellitus without complications: Secondary | ICD-10-CM

## 2013-10-14 DIAGNOSIS — O10019 Pre-existing essential hypertension complicating pregnancy, unspecified trimester: Secondary | ICD-10-CM

## 2013-10-14 DIAGNOSIS — O10919 Unspecified pre-existing hypertension complicating pregnancy, unspecified trimester: Secondary | ICD-10-CM

## 2013-10-14 DIAGNOSIS — E669 Obesity, unspecified: Secondary | ICD-10-CM

## 2013-10-14 LAB — POCT URINALYSIS DIP (DEVICE)
BILIRUBIN URINE: NEGATIVE
GLUCOSE, UA: NEGATIVE mg/dL
Hgb urine dipstick: NEGATIVE
KETONES UR: NEGATIVE mg/dL
LEUKOCYTES UA: NEGATIVE
Nitrite: NEGATIVE
PH: 5.5 (ref 5.0–8.0)
Protein, ur: NEGATIVE mg/dL
Specific Gravity, Urine: 1.025 (ref 1.005–1.030)
Urobilinogen, UA: 0.2 mg/dL (ref 0.0–1.0)

## 2013-10-14 NOTE — Progress Notes (Signed)
Edema-feet   

## 2013-10-14 NOTE — Patient Instructions (Signed)
Return to clinic for any obstetric concerns or go to MAU for evaluation  

## 2013-10-14 NOTE — Progress Notes (Signed)
[facility]13 [facility]

## 2013-10-14 NOTE — Progress Notes (Signed)
Good BS, on diet control for now.  Continue ASA as prescribed.  Normal BP, continue Labetalol. Normal anatomy scan, will do serial growth scans q 4 weeks; this was ordered today Counseled about TOLAC vs RCS, risks/benefits reviewed in detail. All questions answered. Patient desires TOLAC, consent signed today. If baby gets too big like her last one, she says she may change her mid later.  No other complaints or concerns.  Routine obstetric and hypertension precautions reviewed.

## 2013-10-28 ENCOUNTER — Ambulatory Visit (INDEPENDENT_AMBULATORY_CARE_PROVIDER_SITE_OTHER): Payer: Medicaid Other | Admitting: Family

## 2013-10-28 ENCOUNTER — Encounter: Payer: Self-pay | Admitting: Family

## 2013-10-28 VITALS — BP 115/74 | HR 77 | Temp 98.7°F | Wt 268.1 lb

## 2013-10-28 DIAGNOSIS — O10019 Pre-existing essential hypertension complicating pregnancy, unspecified trimester: Secondary | ICD-10-CM

## 2013-10-28 DIAGNOSIS — E119 Type 2 diabetes mellitus without complications: Secondary | ICD-10-CM

## 2013-10-28 DIAGNOSIS — O10919 Unspecified pre-existing hypertension complicating pregnancy, unspecified trimester: Secondary | ICD-10-CM

## 2013-10-28 DIAGNOSIS — O99210 Obesity complicating pregnancy, unspecified trimester: Secondary | ICD-10-CM

## 2013-10-28 DIAGNOSIS — O99211 Obesity complicating pregnancy, first trimester: Secondary | ICD-10-CM

## 2013-10-28 DIAGNOSIS — O24919 Unspecified diabetes mellitus in pregnancy, unspecified trimester: Secondary | ICD-10-CM

## 2013-10-28 DIAGNOSIS — E669 Obesity, unspecified: Secondary | ICD-10-CM

## 2013-10-28 LAB — POCT URINALYSIS DIP (DEVICE)
Bilirubin Urine: NEGATIVE
Glucose, UA: NEGATIVE mg/dL
Hgb urine dipstick: NEGATIVE
KETONES UR: NEGATIVE mg/dL
LEUKOCYTES UA: NEGATIVE
Nitrite: NEGATIVE
PROTEIN: NEGATIVE mg/dL
Specific Gravity, Urine: 1.025 (ref 1.005–1.030)
UROBILINOGEN UA: 0.2 mg/dL (ref 0.0–1.0)
pH: 6 (ref 5.0–8.0)

## 2013-10-28 NOTE — Progress Notes (Signed)
Feta Echo scheduled 10/31/13 at 1 pm with Dr. Elizebeth Brookingotton. Appointment scheduled with [facility] on 11/08/13 at 215 pm.

## 2013-10-28 NOTE — Progress Notes (Signed)
Patient reports pain with contractions

## 2013-10-28 NOTE — Progress Notes (Signed)
Blood sugars not checked 10/14/13-10/24/13;  Problems with getting a meter.  Blood sugar in log 5/15 70-80's.  Explained importance of monitoring glucose first thing in morning, two hours after breakfast, lunch and dinner.  Discussed diabetes and impact on every body system if poorly controlled.  Explained why we order fetal echo, opth exam.  Follow-up ultrasound already scheduled on 6/1 with [facility].

## 2013-11-06 ENCOUNTER — Encounter: Payer: Self-pay | Admitting: General Practice

## 2013-11-07 ENCOUNTER — Encounter: Payer: Self-pay | Admitting: *Deleted

## 2013-11-08 ENCOUNTER — Encounter: Payer: Self-pay | Admitting: General Practice

## 2013-11-11 ENCOUNTER — Ambulatory Visit (HOSPITAL_COMMUNITY)
Admission: RE | Admit: 2013-11-11 | Discharge: 2013-11-11 | Disposition: A | Discharge status: Home / Self Care | Payer: Medicaid Other | Source: Ambulatory Visit | Attending: Obstetrics & Gynecology | Admitting: Obstetrics & Gynecology

## 2013-11-11 ENCOUNTER — Ambulatory Visit (INDEPENDENT_AMBULATORY_CARE_PROVIDER_SITE_OTHER): Payer: Medicaid Other | Admitting: Obstetrics & Gynecology

## 2013-11-11 VITALS — BP 120/75 | HR 81 | Temp 98.1°F | Wt 269.3 lb

## 2013-11-11 DIAGNOSIS — O24919 Unspecified diabetes mellitus in pregnancy, unspecified trimester: Secondary | ICD-10-CM

## 2013-11-11 DIAGNOSIS — O09299 Supervision of pregnancy with other poor reproductive or obstetric history, unspecified trimester: Secondary | ICD-10-CM

## 2013-11-11 DIAGNOSIS — E119 Type 2 diabetes mellitus without complications: Secondary | ICD-10-CM

## 2013-11-11 DIAGNOSIS — O10019 Pre-existing essential hypertension complicating pregnancy, unspecified trimester: Secondary | ICD-10-CM

## 2013-11-11 DIAGNOSIS — E669 Obesity, unspecified: Secondary | ICD-10-CM

## 2013-11-11 DIAGNOSIS — O10919 Unspecified pre-existing hypertension complicating pregnancy, unspecified trimester: Secondary | ICD-10-CM

## 2013-11-11 DIAGNOSIS — O99211 Obesity complicating pregnancy, first trimester: Secondary | ICD-10-CM

## 2013-11-11 DIAGNOSIS — I10 Essential (primary) hypertension: Secondary | ICD-10-CM

## 2013-11-11 DIAGNOSIS — O99210 Obesity complicating pregnancy, unspecified trimester: Secondary | ICD-10-CM

## 2013-11-11 DIAGNOSIS — O34219 Maternal care for unspecified type scar from previous cesarean delivery: Secondary | ICD-10-CM | POA: Insufficient documentation

## 2013-11-11 LAB — POCT URINALYSIS DIP (DEVICE)
Bilirubin Urine: NEGATIVE
GLUCOSE, UA: 250 mg/dL — AB
Hgb urine dipstick: NEGATIVE
KETONES UR: NEGATIVE mg/dL
LEUKOCYTES UA: NEGATIVE
Nitrite: NEGATIVE
PH: 5.5 (ref 5.0–8.0)
Protein, ur: NEGATIVE mg/dL
Specific Gravity, Urine: >=1.03 (ref 1.005–1.030)
Urobilinogen, UA: 0.2 mg/dL (ref 0.0–1.0)

## 2013-11-11 NOTE — Progress Notes (Signed)
Reports braxton hicks.

## 2013-11-11 NOTE — Progress Notes (Signed)
[place] today 74 % ile, repeat in 4 weeks. FBS 62, PP 64-70, drop to BID testing  With FBS and PP

## 2013-11-11 NOTE — Patient Instructions (Signed)
Second Trimester of Pregnancy The second trimester is from week 13 through week 28, months 4 through 6. The second trimester is often a time when you feel your best. Your body has also adjusted to being pregnant, and you begin to feel better physically. Usually, morning sickness has lessened or quit completely, you may have more energy, and you may have an increase in appetite. The second trimester is also a time when the fetus is growing rapidly. At the end of the sixth month, the fetus is about 9 inches long and weighs about 1 pounds. You will likely begin to feel the baby move (quickening) between 18 and 20 weeks of the pregnancy. BODY CHANGES Your body goes through many changes during pregnancy. The changes vary from woman to woman.   Your weight will continue to increase. You will notice your lower abdomen bulging out.  You may begin to get stretch marks on your hips, abdomen, and breasts.  You may develop headaches that can be relieved by medicines approved by your caregiver.  You may urinate more often because the fetus is pressing on your bladder.  You may develop or continue to have heartburn as a result of your pregnancy.  You may develop constipation because certain hormones are causing the muscles that push waste through your intestines to slow down.  You may develop hemorrhoids or swollen, bulging veins (varicose veins).  You may have back pain because of the weight gain and pregnancy hormones relaxing your joints between the bones in your pelvis and as a result of a shift in weight and the muscles that support your balance.  Your breasts will continue to grow and be tender.  Your gums may bleed and may be sensitive to brushing and flossing.  Dark spots or blotches (chloasma, mask of pregnancy) may develop on your face. This will likely fade after the baby is born.  A dark line from your belly button to the pubic area (linea nigra) may appear. This will likely fade after the  baby is born. WHAT TO EXPECT AT YOUR PRENATAL VISITS During a routine prenatal visit:  You will be weighed to make sure you and the fetus are growing normally.  Your blood pressure will be taken.  Your abdomen will be measured to track your baby's growth.  The fetal heartbeat will be listened to.  Any test results from the previous visit will be discussed. Your caregiver may ask you:  How you are feeling.  If you are feeling the baby move.  If you have had any abnormal symptoms, such as leaking fluid, bleeding, severe headaches, or abdominal cramping.  If you have any questions. Other tests that may be performed during your second trimester include:  Blood tests that check for:  Low iron levels (anemia).  Gestational diabetes (between 24 and 28 weeks).  Rh antibodies.  Urine tests to check for infections, diabetes, or protein in the urine.  An ultrasound to confirm the proper growth and development of the baby.  An amniocentesis to check for possible genetic problems.  Fetal screens for spina bifida and Down syndrome. HOME CARE INSTRUCTIONS   Avoid all smoking, herbs, alcohol, and unprescribed drugs. These chemicals affect the formation and growth of the baby.  Follow your caregiver's instructions regarding medicine use. There are medicines that are either safe or unsafe to take during pregnancy.  Exercise only as directed by your caregiver. Experiencing uterine cramps is a good sign to stop exercising.  Continue to eat regular,   healthy meals.  Wear a good support bra for breast tenderness.  Do not use hot tubs, steam rooms, or saunas.  Wear your seat belt at all times when driving.  Avoid raw meat, uncooked cheese, cat litter boxes, and soil used by cats. These carry germs that can cause birth defects in the baby.  Take your prenatal vitamins.  Try taking a stool softener (if your caregiver approves) if you develop constipation. Eat more high-fiber foods,  such as fresh vegetables or fruit and whole grains. Drink plenty of fluids to keep your urine clear or pale yellow.  Take warm sitz baths to soothe any pain or discomfort caused by hemorrhoids. Use hemorrhoid cream if your caregiver approves.  If you develop varicose veins, wear support hose. Elevate your feet for 15 minutes, 3 4 times a day. Limit salt in your diet.  Avoid heavy lifting, wear low heel shoes, and practice good posture.  Rest with your legs elevated if you have leg cramps or low back pain.  Visit your dentist if you have not gone yet during your pregnancy. Use a soft toothbrush to brush your teeth and be gentle when you floss.  A sexual relationship may be continued unless your caregiver directs you otherwise.  Continue to go to all your prenatal visits as directed by your caregiver. SEEK MEDICAL CARE IF:   You have dizziness.  You have mild pelvic cramps, pelvic pressure, or nagging pain in the abdominal area.  You have persistent nausea, vomiting, or diarrhea.  You have a bad smelling vaginal discharge.  You have pain with urination. SEEK IMMEDIATE MEDICAL CARE IF:   You have a fever.  You are leaking fluid from your vagina.  You have spotting or bleeding from your vagina.  You have severe abdominal cramping or pain.  You have rapid weight gain or loss.  You have shortness of breath with chest pain.  You notice sudden or extreme swelling of your face, hands, ankles, feet, or legs.  You have not felt your baby move in over an hour.  You have severe headaches that do not go away with medicine.  You have vision changes. Document Released: 05/24/2001 Document Revised: 01/30/2013 Document Reviewed: 07/31/2012 [employer] Patient Information 2014 [employer].  

## 2013-11-25 ENCOUNTER — Ambulatory Visit (INDEPENDENT_AMBULATORY_CARE_PROVIDER_SITE_OTHER): Payer: Medicaid Other | Admitting: Family Medicine

## 2013-11-25 VITALS — BP 121/82 | HR 83 | Temp 98.1°F | Wt 267.0 lb

## 2013-11-25 DIAGNOSIS — E669 Obesity, unspecified: Secondary | ICD-10-CM

## 2013-11-25 DIAGNOSIS — O99210 Obesity complicating pregnancy, unspecified trimester: Secondary | ICD-10-CM

## 2013-11-25 DIAGNOSIS — O99211 Obesity complicating pregnancy, first trimester: Secondary | ICD-10-CM

## 2013-11-25 DIAGNOSIS — E119 Type 2 diabetes mellitus without complications: Secondary | ICD-10-CM

## 2013-11-25 DIAGNOSIS — O24919 Unspecified diabetes mellitus in pregnancy, unspecified trimester: Secondary | ICD-10-CM

## 2013-11-25 LAB — POCT URINALYSIS DIP (DEVICE)
BILIRUBIN URINE: NEGATIVE
Glucose, UA: NEGATIVE mg/dL
Hgb urine dipstick: NEGATIVE
KETONES UR: NEGATIVE mg/dL
Leukocytes, UA: NEGATIVE
Nitrite: NEGATIVE
Protein, ur: NEGATIVE mg/dL
SPECIFIC GRAVITY, URINE: 1.02 (ref 1.005–1.030)
Urobilinogen, UA: 0.2 mg/dL (ref 0.0–1.0)
pH: 7 (ref 5.0–8.0)

## 2013-11-25 NOTE — Progress Notes (Signed)
+  FM, rare ctx, no lof, no vb Did not bring sugars today. Reports normal, will bring next visit BP at goal - No HA, no vision changes, no severe swelling, right foot swells but reduces, no RUQ pain right now.   S>D - repeat US in 2 wks  Caitlyn [name] is a 33 y.o. [phone] at 593w0d by L=20 here for ROB visit.  Discussed with Patient:  -Plans to Bottle feed.  All questions answered. -Continue prenatal vitamins. -Reviewed fetal kick counts (Pt to perform daily at a time when the baby is active, lie laterally with both hands on belly in quiet room and count all movements (hiccups, shoulder rolls, obvious kicks, etc); pt is to report to clinic or MAU for less than 10 movements felt in a one hour time period--pt told as soon as she counts 10 movements the count is complete.)  - Routine precautions discussed (depression, infection s/s).   Patient provided with all pertinent phone numbers for emergencies. - RTC for any VB, regular, painful cramps/ctxs occurring at a rate of >2/10 min, fever (100.5 or higher), n/v/d, any pain that is unresolving or worsening, LOF, decreased fetal movement, CP, SOB, edema  Problems: Patient Active Problem List   Diagnosis Date Noted  . Hypertension in pregnancy, pre-existing, antepartum 10/14/2013  . Diabetes mellitus (Class B), antepartum 09/10/2013  . Hx of preeclampsia, prior pregnancy, currently pregnant 08/21/2013  . Previous cesarean section complicating pregnancy 08/21/2013  . Hypertension 08/21/2013  . Obesity complicating pregnancy in first trimester 08/21/2013  . 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

## 2013-11-25 NOTE — Patient Instructions (Signed)
Second Trimester of Pregnancy The second trimester is from week 13 through week 28, months 4 through 6. The second trimester is often a time when you feel your best. Your body has also adjusted to being pregnant, and you begin to feel better physically. Usually, morning sickness has lessened or quit completely, you may have more energy, and you may have an increase in appetite. The second trimester is also a time when the fetus is growing rapidly. At the end of the sixth month, the fetus is about 9 inches long and weighs about 1 pounds. You will likely begin to feel the baby move (quickening) between 18 and 20 weeks of the pregnancy. BODY CHANGES Your body goes through many changes during pregnancy. The changes vary from woman to woman.   Your weight will continue to increase. You will notice your lower abdomen bulging out.  You may begin to get stretch marks on your hips, abdomen, and breasts.  You may develop headaches that can be relieved by medicines approved by your caregiver.  You may urinate more often because the fetus is pressing on your bladder.  You may develop or continue to have heartburn as a result of your pregnancy.  You may develop constipation because certain hormones are causing the muscles that push waste through your intestines to slow down.  You may develop hemorrhoids or swollen, bulging veins (varicose veins).  You may have back pain because of the weight gain and pregnancy hormones relaxing your joints between the bones in your pelvis and as a result of a shift in weight and the muscles that support your balance.  Your breasts will continue to grow and be tender.  Your gums may bleed and may be sensitive to brushing and flossing.  Dark spots or blotches (chloasma, mask of pregnancy) may develop on your face. This will likely fade after the baby is born.  A dark line from your belly button to the pubic area (linea nigra) may appear. This will likely fade after the  baby is born. WHAT TO EXPECT AT YOUR PRENATAL VISITS During a routine prenatal visit:  You will be weighed to make sure you and the fetus are growing normally.  Your blood pressure will be taken.  Your abdomen will be measured to track your baby's growth.  The fetal heartbeat will be listened to.  Any test results from the previous visit will be discussed. Your caregiver may ask you:  How you are feeling.  If you are feeling the baby move.  If you have had any abnormal symptoms, such as leaking fluid, bleeding, severe headaches, or abdominal cramping.  If you have any questions. Other tests that may be performed during your second trimester include:  Blood tests that check for:  Low iron levels (anemia).  Gestational diabetes (between 24 and 28 weeks).  Rh antibodies.  Urine tests to check for infections, diabetes, or protein in the urine.  An ultrasound to confirm the proper growth and development of the baby.  An amniocentesis to check for possible genetic problems.  Fetal screens for spina bifida and Down syndrome. HOME CARE INSTRUCTIONS   Avoid all smoking, herbs, alcohol, and unprescribed drugs. These chemicals affect the formation and growth of the baby.  Follow your caregiver's instructions regarding medicine use. There are medicines that are either safe or unsafe to take during pregnancy.  Exercise only as directed by your caregiver. Experiencing uterine cramps is a good sign to stop exercising.  Continue to eat regular,   healthy meals.  Wear a good support bra for breast tenderness.  Do not use hot tubs, steam rooms, or saunas.  Wear your seat belt at all times when driving.  Avoid raw meat, uncooked cheese, cat litter boxes, and soil used by cats. These carry germs that can cause birth defects in the baby.  Take your prenatal vitamins.  Try taking a stool softener (if your caregiver approves) if you develop constipation. Eat more high-fiber foods,  such as fresh vegetables or fruit and whole grains. Drink plenty of fluids to keep your urine clear or pale yellow.  Take warm sitz baths to soothe any pain or discomfort caused by hemorrhoids. Use hemorrhoid cream if your caregiver approves.  If you develop varicose veins, wear support hose. Elevate your feet for 15 minutes, 3 4 times a day. Limit salt in your diet.  Avoid heavy lifting, wear low heel shoes, and practice good posture.  Rest with your legs elevated if you have leg cramps or low back pain.  Visit your dentist if you have not gone yet during your pregnancy. Use a soft toothbrush to brush your teeth and be gentle when you floss.  A sexual relationship may be continued unless your caregiver directs you otherwise.  Continue to go to all your prenatal visits as directed by your caregiver. SEEK MEDICAL CARE IF:   You have dizziness.  You have mild pelvic cramps, pelvic pressure, or nagging pain in the abdominal area.  You have persistent nausea, vomiting, or diarrhea.  You have a bad smelling vaginal discharge.  You have pain with urination. SEEK IMMEDIATE MEDICAL CARE IF:   You have a fever.  You are leaking fluid from your vagina.  You have spotting or bleeding from your vagina.  You have severe abdominal cramping or pain.  You have rapid weight gain or loss.  You have shortness of breath with chest pain.  You notice sudden or extreme swelling of your face, hands, ankles, feet, or legs.  You have not felt your baby move in over an hour.  You have severe headaches that do not go away with medicine.  You have vision changes. Document Released: 05/24/2001 Document Revised: 01/30/2013 Document Reviewed: 07/31/2012 [employer] Patient Information 2014 [employer].  

## 2013-11-25 NOTE — Progress Notes (Signed)
BTL papers signed.  

## 2013-11-25 NOTE — Progress Notes (Signed)
Patient reports continued occasional contractions

## 2013-11-28 ENCOUNTER — Encounter: Payer: Self-pay | Admitting: *Deleted

## 2013-12-09 ENCOUNTER — Other Ambulatory Visit: Payer: Self-pay | Admitting: Obstetrics & Gynecology

## 2013-12-09 ENCOUNTER — Ambulatory Visit (INDEPENDENT_AMBULATORY_CARE_PROVIDER_SITE_OTHER): Payer: Medicaid Other | Admitting: Obstetrics & Gynecology

## 2013-12-09 ENCOUNTER — Ambulatory Visit (HOSPITAL_COMMUNITY)
Admission: RE | Admit: 2013-12-09 | Discharge: 2013-12-09 | Disposition: A | Discharge status: Home / Self Care | Payer: Medicaid Other | Source: Ambulatory Visit | Attending: Obstetrics & Gynecology | Admitting: Obstetrics & Gynecology

## 2013-12-09 VITALS — BP 114/78 | HR 85 | Wt 272.0 lb

## 2013-12-09 DIAGNOSIS — O09299 Supervision of pregnancy with other poor reproductive or obstetric history, unspecified trimester: Secondary | ICD-10-CM

## 2013-12-09 DIAGNOSIS — O34219 Maternal care for unspecified type scar from previous cesarean delivery: Secondary | ICD-10-CM

## 2013-12-09 DIAGNOSIS — E119 Type 2 diabetes mellitus without complications: Secondary | ICD-10-CM | POA: Insufficient documentation

## 2013-12-09 DIAGNOSIS — O0990 Supervision of high risk pregnancy, unspecified, unspecified trimester: Secondary | ICD-10-CM

## 2013-12-09 DIAGNOSIS — O0993 Supervision of high risk pregnancy, unspecified, third trimester: Secondary | ICD-10-CM

## 2013-12-09 DIAGNOSIS — O10919 Unspecified pre-existing hypertension complicating pregnancy, unspecified trimester: Secondary | ICD-10-CM

## 2013-12-09 DIAGNOSIS — O24919 Unspecified diabetes mellitus in pregnancy, unspecified trimester: Secondary | ICD-10-CM

## 2013-12-09 DIAGNOSIS — O99810 Abnormal glucose complicating pregnancy: Secondary | ICD-10-CM

## 2013-12-09 DIAGNOSIS — Z3689 Encounter for other specified antenatal screening: Secondary | ICD-10-CM | POA: Insufficient documentation

## 2013-12-09 DIAGNOSIS — O24410 Gestational diabetes mellitus in pregnancy, diet controlled: Secondary | ICD-10-CM

## 2013-12-09 DIAGNOSIS — Z23 Encounter for immunization: Secondary | ICD-10-CM

## 2013-12-09 DIAGNOSIS — I10 Essential (primary) hypertension: Secondary | ICD-10-CM

## 2013-12-09 DIAGNOSIS — O24419 Gestational diabetes mellitus in pregnancy, unspecified control: Secondary | ICD-10-CM

## 2013-12-09 DIAGNOSIS — O10019 Pre-existing essential hypertension complicating pregnancy, unspecified trimester: Secondary | ICD-10-CM | POA: Insufficient documentation

## 2013-12-09 LAB — CBC
HEMATOCRIT: 32.7 % — AB (ref 36.0–46.0)
Hemoglobin: 11.4 g/dL — ABNORMAL LOW (ref 12.0–15.0)
MCH: 27.4 pg (ref 26.0–34.0)
MCHC: 34.9 g/dL (ref 30.0–36.0)
MCV: 78.6 fL (ref 78.0–100.0)
Platelets: 227 10*3/uL (ref 150–400)
RBC: 4.16 MIL/uL (ref 3.87–5.11)
RDW: 14.7 % (ref 11.5–15.5)
WBC: 7.8 10*3/uL (ref 4.0–10.5)

## 2013-12-09 LAB — HEMOGLOBIN A1C
HEMOGLOBIN A1C: 6.1 % — AB (ref <5.7)
Mean Plasma Glucose: 128 mg/dL — ABNORMAL HIGH (ref <117)

## 2013-12-09 LAB — POCT URINALYSIS DIP (DEVICE)
Bilirubin Urine: NEGATIVE
GLUCOSE, UA: NEGATIVE mg/dL
HGB URINE DIPSTICK: NEGATIVE
KETONES UR: NEGATIVE mg/dL
Leukocytes, UA: NEGATIVE
Nitrite: NEGATIVE
Protein, ur: NEGATIVE mg/dL
SPECIFIC GRAVITY, URINE: 1.02 (ref 1.005–1.030)
Urobilinogen, UA: 0.2 mg/dL (ref 0.0–1.0)
pH: 6 (ref 5.0–8.0)

## 2013-12-09 MED ORDER — TETANUS-DIPHTH-ACELL PERTUSSIS 5-2.5-18.5 LF-MCG/0.5 IM SUSP
0.5000 mL | Freq: Once | INTRAMUSCULAR | Status: AC
  Administered 2013-12-09: 0.5 mL via INTRAMUSCULAR

## 2013-12-09 NOTE — Progress Notes (Signed)
CBG all nml in 60s.  Pt is only on diet.  BP stable.  US for growth today.  Pt may consider rpt c/s with BTL.  Can decide closet to term--depending on cervical exam.  BTL papers already signed.

## 2013-12-10 ENCOUNTER — Encounter: Payer: Self-pay | Admitting: Obstetrics & Gynecology

## 2013-12-10 LAB — RPR

## 2013-12-10 LAB — HIV ANTIBODY (ROUTINE TESTING W REFLEX): HIV 1&2 Ab, 4th Generation: NONREACTIVE

## 2013-12-12 ENCOUNTER — Other Ambulatory Visit: Payer: Self-pay | Admitting: Obstetrics & Gynecology

## 2013-12-12 DIAGNOSIS — O09299 Supervision of pregnancy with other poor reproductive or obstetric history, unspecified trimester: Secondary | ICD-10-CM

## 2013-12-12 DIAGNOSIS — O0930 Supervision of pregnancy with insufficient antenatal care, unspecified trimester: Secondary | ICD-10-CM

## 2013-12-12 DIAGNOSIS — O34219 Maternal care for unspecified type scar from previous cesarean delivery: Secondary | ICD-10-CM

## 2013-12-12 DIAGNOSIS — E669 Obesity, unspecified: Secondary | ICD-10-CM

## 2013-12-12 DIAGNOSIS — O99210 Obesity complicating pregnancy, unspecified trimester: Secondary | ICD-10-CM

## 2013-12-12 DIAGNOSIS — O99810 Abnormal glucose complicating pregnancy: Secondary | ICD-10-CM

## 2013-12-12 DIAGNOSIS — O10019 Pre-existing essential hypertension complicating pregnancy, unspecified trimester: Secondary | ICD-10-CM

## 2013-12-23 ENCOUNTER — Encounter: Payer: Self-pay | Admitting: Family Medicine

## 2013-12-23 ENCOUNTER — Ambulatory Visit (INDEPENDENT_AMBULATORY_CARE_PROVIDER_SITE_OTHER): Payer: Medicaid Other | Admitting: Family Medicine

## 2013-12-23 VITALS — BP 133/81 | HR 87 | Temp 98.3°F | Wt 268.6 lb

## 2013-12-23 DIAGNOSIS — E669 Obesity, unspecified: Secondary | ICD-10-CM

## 2013-12-23 DIAGNOSIS — O24913 Unspecified diabetes mellitus in pregnancy, third trimester: Secondary | ICD-10-CM

## 2013-12-23 DIAGNOSIS — E119 Type 2 diabetes mellitus without complications: Secondary | ICD-10-CM

## 2013-12-23 DIAGNOSIS — O99211 Obesity complicating pregnancy, first trimester: Secondary | ICD-10-CM

## 2013-12-23 DIAGNOSIS — O34219 Maternal care for unspecified type scar from previous cesarean delivery: Secondary | ICD-10-CM

## 2013-12-23 DIAGNOSIS — O3421 Maternal care for scar from previous cesarean delivery: Secondary | ICD-10-CM

## 2013-12-23 DIAGNOSIS — O24919 Unspecified diabetes mellitus in pregnancy, unspecified trimester: Secondary | ICD-10-CM

## 2013-12-23 DIAGNOSIS — O99210 Obesity complicating pregnancy, unspecified trimester: Secondary | ICD-10-CM

## 2013-12-23 DIAGNOSIS — I10 Essential (primary) hypertension: Secondary | ICD-10-CM

## 2013-12-23 LAB — POCT URINALYSIS DIP (DEVICE)
Bilirubin Urine: NEGATIVE
GLUCOSE, UA: NEGATIVE mg/dL
Hgb urine dipstick: NEGATIVE
Ketones, ur: NEGATIVE mg/dL
LEUKOCYTES UA: NEGATIVE
NITRITE: NEGATIVE
PROTEIN: NEGATIVE mg/dL
Specific Gravity, Urine: 1.015 (ref 1.005–1.030)
Urobilinogen, UA: 0.2 mg/dL (ref 0.0–1.0)
pH: 6 (ref 5.0–8.0)

## 2013-12-23 NOTE — Patient Instructions (Signed)
Third Trimester of Pregnancy The third trimester is from week 29 through week 42, months 7 through 9. The third trimester is a time when the fetus is growing rapidly. At the end of the ninth month, the fetus is about 20 inches in length and weighs 6-10 pounds.  BODY CHANGES Your body goes through many changes during pregnancy. The changes vary from woman to woman.   Your weight will continue to increase. You can expect to gain 25-35 pounds (11-16 kg) by the end of the pregnancy.  You may begin to get stretch marks on your hips, abdomen, and breasts.  You may urinate more often because the fetus is moving lower into your pelvis and pressing on your bladder.  You may develop or continue to have heartburn as a result of your pregnancy.  You may develop constipation because certain hormones are causing the muscles that push waste through your intestines to slow down.  You may develop hemorrhoids or swollen, bulging veins (varicose veins).  You may have pelvic pain because of the weight gain and pregnancy hormones relaxing your joints between the bones in your pelvis. Backaches may result from overexertion of the muscles supporting your posture.  You may have changes in your hair. These can include thickening of your hair, rapid growth, and changes in texture. Some women also have hair loss during or after pregnancy, or hair that feels dry or thin. Your hair will most likely return to normal after your baby is born.  Your breasts will continue to grow and be tender. A yellow discharge may leak from your breasts called colostrum.  Your belly button may stick out.  You may feel short of breath because of your expanding uterus.  You may notice the fetus "dropping," or moving lower in your abdomen.  You may have a bloody mucus discharge. This usually occurs a few days to a week before labor begins.  Your cervix becomes thin and soft (effaced) near your due date. WHAT TO EXPECT AT YOUR PRENATAL  EXAMS  You will have prenatal exams every 2 weeks until week 36. Then, you will have weekly prenatal exams. During a routine prenatal visit:  You will be weighed to make sure you and the fetus are growing normally.  Your blood pressure is taken.  Your abdomen will be measured to track your baby's growth.  The fetal heartbeat will be listened to.  Any test results from the previous visit will be discussed.  You may have a cervical check near your due date to see if you have effaced. At around 36 weeks, your caregiver will check your cervix. At the same time, your caregiver will also perform a test on the secretions of the vaginal tissue. This test is to determine if a type of bacteria, Group B streptococcus, is present. Your caregiver will explain this further. Your caregiver may ask you:  What your birth plan is.  How you are feeling.  If you are feeling the baby move.  If you have had any abnormal symptoms, such as leaking fluid, bleeding, severe headaches, or abdominal cramping.  If you have any questions. Other tests or screenings that may be performed during your third trimester include:  Blood tests that check for low iron levels (anemia).  Fetal testing to check the health, activity level, and growth of the fetus. Testing is done if you have certain medical conditions or if there are problems during the pregnancy. FALSE LABOR You may feel small, irregular contractions that   eventually go away. These are called Braxton Hicks contractions, or false labor. Contractions may last for hours, days, or even weeks before true labor sets in. If contractions come at regular intervals, intensify, or become painful, it is best to be seen by your caregiver.  SIGNS OF LABOR   Menstrual-like cramps.  Contractions that are 5 minutes apart or less.  Contractions that start on the top of the uterus and spread down to the lower abdomen and back.  A sense of increased pelvic pressure or back  pain.  A watery or bloody mucus discharge that comes from the vagina. If you have any of these signs before the 37th week of pregnancy, call your caregiver right away. You need to go to the hospital to get checked immediately. HOME CARE INSTRUCTIONS   Avoid all smoking, herbs, alcohol, and unprescribed drugs. These chemicals affect the formation and growth of the baby.  Follow your caregiver's instructions regarding medicine use. There are medicines that are either safe or unsafe to take during pregnancy.  Exercise only as directed by your caregiver. Experiencing uterine cramps is a good sign to stop exercising.  Continue to eat regular, healthy meals.  Wear a good support bra for breast tenderness.  Do not use hot tubs, steam rooms, or saunas.  Wear your seat belt at all times when driving.  Avoid raw meat, uncooked cheese, cat litter boxes, and soil used by cats. These carry germs that can cause birth defects in the baby.  Take your prenatal vitamins.  Try taking a stool softener (if your caregiver approves) if you develop constipation. Eat more high-fiber foods, such as fresh vegetables or fruit and whole grains. Drink plenty of fluids to keep your urine clear or pale yellow.  Take warm sitz baths to soothe any pain or discomfort caused by hemorrhoids. Use hemorrhoid cream if your caregiver approves.  If you develop varicose veins, wear support hose. Elevate your feet for 15 minutes, 3-4 times a day. Limit salt in your diet.  Avoid heavy lifting, wear low heal shoes, and practice good posture.  Rest a lot with your legs elevated if you have leg cramps or low back pain.  Visit your dentist if you have not gone during your pregnancy. Use a soft toothbrush to brush your teeth and be gentle when you floss.  A sexual relationship may be continued unless your caregiver directs you otherwise.  Do not travel far distances unless it is absolutely necessary and only with the approval  of your caregiver.  Take prenatal classes to understand, practice, and ask questions about the labor and delivery.  Make a trial run to the hospital.  Pack your hospital bag.  Prepare the baby's nursery.  Continue to go to all your prenatal visits as directed by your caregiver. SEEK MEDICAL CARE IF:  You are unsure if you are in labor or if your water has broken.  You have dizziness.  You have mild pelvic cramps, pelvic pressure, or nagging pain in your abdominal area.  You have persistent nausea, vomiting, or diarrhea.  You have a bad smelling vaginal discharge.  You have pain with urination. SEEK IMMEDIATE MEDICAL CARE IF:   You have a fever.  You are leaking fluid from your vagina.  You have spotting or bleeding from your vagina.  You have severe abdominal cramping or pain.  You have rapid weight loss or gain.  You have shortness of breath with chest pain.  You notice sudden or extreme swelling   of your face, hands, ankles, feet, or legs.  You have not felt your baby move in over an hour.  You have severe headaches that do not go away with medicine.  You have vision changes. Document Released: 05/24/2001 Document Revised: 06/04/2013 Document Reviewed: 07/31/2012 ExitCare Patient Information 2015 [employer]. This information is not intended to replace advice given to you by your health care provider. Make sure you discuss any questions you have with your health care provider.  

## 2013-12-23 NOTE — Progress Notes (Signed)
Patient reports some pelvic pressure and pain  

## 2013-12-23 NOTE — Progress Notes (Signed)
S: 33 yo [id number] @ 4943w0d here for ROBV -DM/HTN Sugars are in the 60s fasting and pp 60-80s.  Doing well with just diet. Has cut out all starches  O: see flowsheet  A/P - measuring large but baby 74%ile on US. Has a f/u for growth 4 weeks from now - discussed twice weeekly testing starting in 2 weeks DM- cont diet control HTN- montor for now and twice weekly testing in 2 weeks.

## 2013-12-30 ENCOUNTER — Ambulatory Visit (INDEPENDENT_AMBULATORY_CARE_PROVIDER_SITE_OTHER): Payer: Medicaid Other | Admitting: Obstetrics & Gynecology

## 2013-12-30 VITALS — BP 119/73 | HR 85 | Temp 98.2°F | Wt 269.4 lb

## 2013-12-30 DIAGNOSIS — O99211 Obesity complicating pregnancy, first trimester: Secondary | ICD-10-CM

## 2013-12-30 DIAGNOSIS — O99210 Obesity complicating pregnancy, unspecified trimester: Secondary | ICD-10-CM

## 2013-12-30 DIAGNOSIS — E669 Obesity, unspecified: Secondary | ICD-10-CM

## 2013-12-30 LAB — POCT URINALYSIS DIP (DEVICE)
BILIRUBIN URINE: NEGATIVE
Hgb urine dipstick: NEGATIVE
LEUKOCYTES UA: NEGATIVE
NITRITE: NEGATIVE
Protein, ur: NEGATIVE mg/dL
Specific Gravity, Urine: 1.02 (ref 1.005–1.030)
UROBILINOGEN UA: 0.2 mg/dL (ref 0.0–1.0)
pH: 6 (ref 5.0–8.0)

## 2013-12-30 NOTE — Progress Notes (Signed)
No concerns today 

## 2013-12-30 NOTE — Progress Notes (Signed)
CBGs fastings are in 60s.  All pp are <100.  BP stable on labetalol.  Needs 2x week testing starting next week.  MD in 2 weeks.  Has US for growth appt later this month.

## 2014-01-06 ENCOUNTER — Ambulatory Visit (HOSPITAL_COMMUNITY)
Admission: RE | Admit: 2014-01-06 | Discharge: 2014-01-06 | Disposition: A | Discharge status: Home / Self Care | Payer: Medicaid Other | Source: Ambulatory Visit | Attending: Obstetrics & Gynecology | Admitting: Obstetrics & Gynecology

## 2014-01-06 ENCOUNTER — Encounter: Payer: Medicaid Other | Admitting: Family Medicine

## 2014-01-06 ENCOUNTER — Other Ambulatory Visit: Payer: Medicaid Other

## 2014-01-06 ENCOUNTER — Telehealth: Payer: Self-pay | Admitting: General Practice

## 2014-01-06 VITALS — BP 121/70 | HR 81 | Wt 271.5 lb

## 2014-01-06 DIAGNOSIS — O99810 Abnormal glucose complicating pregnancy: Secondary | ICD-10-CM | POA: Diagnosis not present

## 2014-01-06 DIAGNOSIS — O0932 Supervision of pregnancy with insufficient antenatal care, second trimester: Secondary | ICD-10-CM

## 2014-01-06 DIAGNOSIS — O09299 Supervision of pregnancy with other poor reproductive or obstetric history, unspecified trimester: Secondary | ICD-10-CM | POA: Diagnosis not present

## 2014-01-06 DIAGNOSIS — R109 Unspecified abdominal pain: Secondary | ICD-10-CM

## 2014-01-06 DIAGNOSIS — Z23 Encounter for immunization: Secondary | ICD-10-CM

## 2014-01-06 DIAGNOSIS — O99210 Obesity complicating pregnancy, unspecified trimester: Secondary | ICD-10-CM

## 2014-01-06 DIAGNOSIS — O09293 Supervision of pregnancy with other poor reproductive or obstetric history, third trimester: Secondary | ICD-10-CM

## 2014-01-06 DIAGNOSIS — O0930 Supervision of pregnancy with insufficient antenatal care, unspecified trimester: Secondary | ICD-10-CM | POA: Diagnosis not present

## 2014-01-06 DIAGNOSIS — O10913 Unspecified pre-existing hypertension complicating pregnancy, third trimester: Secondary | ICD-10-CM

## 2014-01-06 DIAGNOSIS — O34219 Maternal care for unspecified type scar from previous cesarean delivery: Secondary | ICD-10-CM

## 2014-01-06 DIAGNOSIS — O10019 Pre-existing essential hypertension complicating pregnancy, unspecified trimester: Secondary | ICD-10-CM | POA: Diagnosis present

## 2014-01-06 DIAGNOSIS — O99211 Obesity complicating pregnancy, first trimester: Secondary | ICD-10-CM

## 2014-01-06 DIAGNOSIS — E669 Obesity, unspecified: Secondary | ICD-10-CM | POA: Diagnosis not present

## 2014-01-06 DIAGNOSIS — O26899 Other specified pregnancy related conditions, unspecified trimester: Secondary | ICD-10-CM

## 2014-01-06 DIAGNOSIS — O99212 Obesity complicating pregnancy, second trimester: Secondary | ICD-10-CM

## 2014-01-06 DIAGNOSIS — O24913 Unspecified diabetes mellitus in pregnancy, third trimester: Secondary | ICD-10-CM

## 2014-01-06 DIAGNOSIS — Z3493 Encounter for supervision of normal pregnancy, unspecified, third trimester: Secondary | ICD-10-CM

## 2014-01-06 DIAGNOSIS — M654 Radial styloid tenosynovitis [de Quervain]: Secondary | ICD-10-CM

## 2014-01-06 LAB — OB RESULTS CONSOLE GBS: GBS: NEGATIVE

## 2014-01-06 LAB — OB RESULTS CONSOLE GC/CHLAMYDIA
Chlamydia: NEGATIVE
GC PROBE AMP, GENITAL: NEGATIVE

## 2014-01-06 MED ORDER — PRENATAL PLUS 27-1 MG PO TABS: 1.0000 | ORAL_TABLET | Freq: Every day | ORAL | Status: DC

## 2014-01-06 NOTE — Telephone Encounter (Signed)
Patient called and left message stating she would like a Rx for prenatal vitamins and would like to speak to Dr Penne LashLeggett as well. Called patient back and she states she needs the vitamins sent in. Told patient I would handle that for her. Patient verbalized understanding and states she also needs Dr Penne LashLeggett to write her out of work for August. States she has already completed her FMLA paperwork but needs a note out starting in August. Told patient she will need to discuss that with a provider at her next visit and that it doesn't necessarily need to be Dr Penne LashLeggett. patient verbalized understanding and states she is trying to get this in in a timely manner and was hoping to have it done before then, maybe at her NST appt tomorrow. Told patient that Diane can see if a provider will speak with her about that but I wasn't sure someone would be able to but that she can definitely speak to someone about that at her appt in August. Patient verbalized understanding and had no other questions

## 2014-01-07 ENCOUNTER — Ambulatory Visit (INDEPENDENT_AMBULATORY_CARE_PROVIDER_SITE_OTHER): Payer: Medicaid Other | Admitting: *Deleted

## 2014-01-07 ENCOUNTER — Encounter: Payer: Self-pay | Admitting: Obstetrics & Gynecology

## 2014-01-07 VITALS — BP 133/68 | HR 87 | Wt 269.4 lb

## 2014-01-07 DIAGNOSIS — O24919 Unspecified diabetes mellitus in pregnancy, unspecified trimester: Secondary | ICD-10-CM

## 2014-01-07 DIAGNOSIS — O10019 Pre-existing essential hypertension complicating pregnancy, unspecified trimester: Secondary | ICD-10-CM

## 2014-01-08 ENCOUNTER — Telehealth: Payer: Self-pay | Admitting: *Deleted

## 2014-01-08 NOTE — Telephone Encounter (Signed)
Called [facility] to follow up if patient did keep her referral appointment. She did keep appointment and they will fax records today.

## 2014-01-10 ENCOUNTER — Ambulatory Visit (INDEPENDENT_AMBULATORY_CARE_PROVIDER_SITE_OTHER): Payer: Medicaid Other | Admitting: *Deleted

## 2014-01-10 ENCOUNTER — Encounter: Payer: Self-pay | Admitting: *Deleted

## 2014-01-10 ENCOUNTER — Telehealth: Payer: Self-pay | Admitting: *Deleted

## 2014-01-10 VITALS — BP 118/70 | HR 91

## 2014-01-10 DIAGNOSIS — O10019 Pre-existing essential hypertension complicating pregnancy, unspecified trimester: Secondary | ICD-10-CM

## 2014-01-10 DIAGNOSIS — E119 Type 2 diabetes mellitus without complications: Secondary | ICD-10-CM

## 2014-01-10 DIAGNOSIS — O24913 Unspecified diabetes mellitus in pregnancy, third trimester: Secondary | ICD-10-CM

## 2014-01-10 DIAGNOSIS — O10913 Unspecified pre-existing hypertension complicating pregnancy, third trimester: Secondary | ICD-10-CM

## 2014-01-10 DIAGNOSIS — O24919 Unspecified diabetes mellitus in pregnancy, unspecified trimester: Secondary | ICD-10-CM

## 2014-01-10 LAB — FETAL NONSTRESS TEST

## 2014-01-10 NOTE — Progress Notes (Signed)
Pt requesting to start FMLA 8/1 and is requesting a letter for her job stating that she cannot work. I discussed with pt that she will need to discuss with MD at prenatal visit on 8/4. Also, that would mean that she would have very little FMLA time off after the baby is born.

## 2014-01-10 NOTE — Telephone Encounter (Signed)
Patient came into the office this morning requesting to be taken out of work starting 01/13/14.  Patient had presented FMLA papers which were completed based on her [facility] and [facility].  Patient states her work has approved for her to be out of work for 6 months but they require a note from us to take her out starting in August.  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  I spoke with Dr. Debroah LoopArnold who agrees to have patient stop working effective 01/13/14 until approximately six weeks after delivery of her baby.  Patient returned my call at approximately 11 am.  She will pick up her FMLA papers and additional letter at her appointment on Monday 01/13/14.  Patient states she is fine with waiting until Monday 01/13/14.  Explained I would have the papers ready for her at the front desk when she checks in for her appointment on Monday.  Patient states understanding.

## 2014-01-10 NOTE — Progress Notes (Signed)
NST reviewed and reactive.  

## 2014-01-13 ENCOUNTER — Encounter: Payer: Self-pay | Admitting: *Deleted

## 2014-01-13 ENCOUNTER — Ambulatory Visit (INDEPENDENT_AMBULATORY_CARE_PROVIDER_SITE_OTHER): Payer: Medicaid Other | Admitting: Obstetrics & Gynecology

## 2014-01-13 VITALS — BP 119/72 | Wt 271.6 lb

## 2014-01-13 DIAGNOSIS — O99211 Obesity complicating pregnancy, first trimester: Secondary | ICD-10-CM

## 2014-01-13 DIAGNOSIS — O24919 Unspecified diabetes mellitus in pregnancy, unspecified trimester: Secondary | ICD-10-CM

## 2014-01-13 DIAGNOSIS — O99210 Obesity complicating pregnancy, unspecified trimester: Secondary | ICD-10-CM

## 2014-01-13 DIAGNOSIS — O24913 Unspecified diabetes mellitus in pregnancy, third trimester: Secondary | ICD-10-CM

## 2014-01-13 DIAGNOSIS — E669 Obesity, unspecified: Secondary | ICD-10-CM

## 2014-01-13 DIAGNOSIS — E119 Type 2 diabetes mellitus without complications: Secondary | ICD-10-CM

## 2014-01-13 LAB — POCT URINALYSIS DIP (DEVICE)
BILIRUBIN URINE: NEGATIVE
GLUCOSE, UA: NEGATIVE mg/dL
Hgb urine dipstick: NEGATIVE
Ketones, ur: NEGATIVE mg/dL
Leukocytes, UA: NEGATIVE
NITRITE: NEGATIVE
Protein, ur: NEGATIVE mg/dL
Specific Gravity, Urine: 1.025 (ref 1.005–1.030)
Urobilinogen, UA: 0.2 mg/dL (ref 0.0–1.0)
pH: 6 (ref 5.0–8.0)

## 2014-01-13 NOTE — Progress Notes (Signed)
States BG normal at home on diet. US 7/27 87 %ile nl AFI

## 2014-01-13 NOTE — Patient Instructions (Signed)
Third Trimester of Pregnancy The third trimester is from week 29 through week 42, months 7 through 9. The third trimester is a time when the fetus is growing rapidly. At the end of the ninth month, the fetus is about 20 inches in length and weighs 6-10 pounds.  BODY CHANGES Your body goes through many changes during pregnancy. The changes vary from woman to woman.   Your weight will continue to increase. You can expect to gain 25-35 pounds (11-16 kg) by the end of the pregnancy.  You may begin to get stretch marks on your hips, abdomen, and breasts.  You may urinate more often because the fetus is moving lower into your pelvis and pressing on your bladder.  You may develop or continue to have heartburn as a result of your pregnancy.  You may develop constipation because certain hormones are causing the muscles that push waste through your intestines to slow down.  You may develop hemorrhoids or swollen, bulging veins (varicose veins).  You may have pelvic pain because of the weight gain and pregnancy hormones relaxing your joints between the bones in your pelvis. Backaches may result from overexertion of the muscles supporting your posture.  You may have changes in your hair. These can include thickening of your hair, rapid growth, and changes in texture. Some women also have hair loss during or after pregnancy, or hair that feels dry or thin. Your hair will most likely return to normal after your baby is born.  Your breasts will continue to grow and be tender. A yellow discharge may leak from your breasts called colostrum.  Your belly button may stick out.  You may feel short of breath because of your expanding uterus.  You may notice the fetus "dropping," or moving lower in your abdomen.  You may have a bloody mucus discharge. This usually occurs a few days to a week before labor begins.  Your cervix becomes thin and soft (effaced) near your due date. WHAT TO EXPECT AT YOUR PRENATAL  EXAMS  You will have prenatal exams every 2 weeks until week 36. Then, you will have weekly prenatal exams. During a routine prenatal visit:  You will be weighed to make sure you and the fetus are growing normally.  Your blood pressure is taken.  Your abdomen will be measured to track your baby's growth.  The fetal heartbeat will be listened to.  Any test results from the previous visit will be discussed.  You may have a cervical check near your due date to see if you have effaced. At around 36 weeks, your caregiver will check your cervix. At the same time, your caregiver will also perform a test on the secretions of the vaginal tissue. This test is to determine if a type of bacteria, Group B streptococcus, is present. Your caregiver will explain this further. Your caregiver may ask you:  What your birth plan is.  How you are feeling.  If you are feeling the baby move.  If you have had any abnormal symptoms, such as leaking fluid, bleeding, severe headaches, or abdominal cramping.  If you have any questions. Other tests or screenings that may be performed during your third trimester include:  Blood tests that check for low iron levels (anemia).  Fetal testing to check the health, activity level, and growth of the fetus. Testing is done if you have certain medical conditions or if there are problems during the pregnancy. FALSE LABOR You may feel small, irregular contractions that   eventually go away. These are called Braxton Hicks contractions, or false labor. Contractions may last for hours, days, or even weeks before true labor sets in. If contractions come at regular intervals, intensify, or become painful, it is best to be seen by your caregiver.  SIGNS OF LABOR   Menstrual-like cramps.  Contractions that are 5 minutes apart or less.  Contractions that start on the top of the uterus and spread down to the lower abdomen and back.  A sense of increased pelvic pressure or back  pain.  A watery or bloody mucus discharge that comes from the vagina. If you have any of these signs before the 37th week of pregnancy, call your caregiver right away. You need to go to the hospital to get checked immediately. HOME CARE INSTRUCTIONS   Avoid all smoking, herbs, alcohol, and unprescribed drugs. These chemicals affect the formation and growth of the baby.  Follow your caregiver's instructions regarding medicine use. There are medicines that are either safe or unsafe to take during pregnancy.  Exercise only as directed by your caregiver. Experiencing uterine cramps is a good sign to stop exercising.  Continue to eat regular, healthy meals.  Wear a good support bra for breast tenderness.  Do not use hot tubs, steam rooms, or saunas.  Wear your seat belt at all times when driving.  Avoid raw meat, uncooked cheese, cat litter boxes, and soil used by cats. These carry germs that can cause birth defects in the baby.  Take your prenatal vitamins.  Try taking a stool softener (if your caregiver approves) if you develop constipation. Eat more high-fiber foods, such as fresh vegetables or fruit and whole grains. Drink plenty of fluids to keep your urine clear or pale yellow.  Take warm sitz baths to soothe any pain or discomfort caused by hemorrhoids. Use hemorrhoid cream if your caregiver approves.  If you develop varicose veins, wear support hose. Elevate your feet for 15 minutes, 3-4 times a day. Limit salt in your diet.  Avoid heavy lifting, wear low heal shoes, and practice good posture.  Rest a lot with your legs elevated if you have leg cramps or low back pain.  Visit your dentist if you have not gone during your pregnancy. Use a soft toothbrush to brush your teeth and be gentle when you floss.  A sexual relationship may be continued unless your caregiver directs you otherwise.  Do not travel far distances unless it is absolutely necessary and only with the approval  of your caregiver.  Take prenatal classes to understand, practice, and ask questions about the labor and delivery.  Make a trial run to the hospital.  Pack your hospital bag.  Prepare the baby's nursery.  Continue to go to all your prenatal visits as directed by your caregiver. SEEK MEDICAL CARE IF:  You are unsure if you are in labor or if your water has broken.  You have dizziness.  You have mild pelvic cramps, pelvic pressure, or nagging pain in your abdominal area.  You have persistent nausea, vomiting, or diarrhea.  You have a bad smelling vaginal discharge.  You have pain with urination. SEEK IMMEDIATE MEDICAL CARE IF:   You have a fever.  You are leaking fluid from your vagina.  You have spotting or bleeding from your vagina.  You have severe abdominal cramping or pain.  You have rapid weight loss or gain.  You have shortness of breath with chest pain.  You notice sudden or extreme swelling   of your face, hands, ankles, feet, or legs.  You have not felt your baby move in over an hour.  You have severe headaches that do not go away with medicine.  You have vision changes. Document Released: 05/24/2001 Document Revised: 06/04/2013 Document Reviewed: 07/31/2012 ExitCare Patient Information 2015 [employer]. This information is not intended to replace advice given to you by your health care provider. Make sure you discuss any questions you have with your health care provider.  

## 2014-01-17 ENCOUNTER — Other Ambulatory Visit: Payer: Medicaid Other

## 2014-01-17 ENCOUNTER — Encounter: Payer: Self-pay | Admitting: *Deleted

## 2014-01-20 ENCOUNTER — Other Ambulatory Visit: Payer: Medicaid Other

## 2014-01-28 ENCOUNTER — Other Ambulatory Visit: Payer: Medicaid Other

## 2014-01-30 ENCOUNTER — Ambulatory Visit (INDEPENDENT_AMBULATORY_CARE_PROVIDER_SITE_OTHER): Payer: Medicaid Other | Admitting: Family Medicine

## 2014-01-30 ENCOUNTER — Other Ambulatory Visit: Payer: Medicaid Other

## 2014-01-30 VITALS — BP 134/83 | HR 84 | Wt 273.9 lb

## 2014-01-30 DIAGNOSIS — O10019 Pre-existing essential hypertension complicating pregnancy, unspecified trimester: Secondary | ICD-10-CM

## 2014-01-30 DIAGNOSIS — O10913 Unspecified pre-existing hypertension complicating pregnancy, third trimester: Secondary | ICD-10-CM

## 2014-01-30 LAB — POCT URINALYSIS DIP (DEVICE)
BILIRUBIN URINE: NEGATIVE
Glucose, UA: 500 mg/dL — AB
Hgb urine dipstick: NEGATIVE
Leukocytes, UA: NEGATIVE
NITRITE: NEGATIVE
Protein, ur: NEGATIVE mg/dL
Specific Gravity, Urine: 1.02 (ref 1.005–1.030)
Urobilinogen, UA: 0.2 mg/dL (ref 0.0–1.0)
pH: 5.5 (ref 5.0–8.0)

## 2014-01-30 LAB — CBC
HCT: 33.4 % — ABNORMAL LOW (ref 36.0–46.0)
Hemoglobin: 11.5 g/dL — ABNORMAL LOW (ref 12.0–15.0)
MCH: 27 pg (ref 26.0–34.0)
MCHC: 34.4 g/dL (ref 30.0–36.0)
MCV: 78.4 fL (ref 78.0–100.0)
Platelets: 187 K/uL (ref 150–400)
RBC: 4.26 MIL/uL (ref 3.87–5.11)
RDW: 13.9 % (ref 11.5–15.5)
WBC: 6.4 K/uL (ref 4.0–10.5)

## 2014-01-30 NOTE — Progress Notes (Signed)
US for growth @ [facility] on 8/24.   Pt states she never started taking Labetalol when ordered in April.. She also states that she checks her blood sugar 3 times daily- says the doctor told her she did not need to do 4 times daily.

## 2014-01-30 NOTE — Progress Notes (Signed)
Patient is 33 y.o. [id number] 8684w3d.  +FM, denies LOF, VB, vaginal discharge.  Rare contractions when walks. => A2/BDM: well controlled, all within normal ranges.  Fasting: 69-70  2h PP: 71-85- BP141/77 ==> no HA, visual changes, no RUQ, +right LE edema (since became pregnant, not new, unchanged, not painful) => BP:not taking labetalol, reports BP has been better as she started eating raisin bran and stopped salt >>labs and repeat 24h urine ordered, PreE precautions given

## 2014-02-03 ENCOUNTER — Ambulatory Visit (HOSPITAL_COMMUNITY)
Admission: RE | Admit: 2014-02-03 | Discharge: 2014-02-03 | Disposition: A | Discharge status: Home / Self Care | Payer: Medicaid Other | Source: Ambulatory Visit | Attending: Obstetrics & Gynecology | Admitting: Obstetrics & Gynecology

## 2014-02-03 ENCOUNTER — Encounter (HOSPITAL_COMMUNITY): Payer: Self-pay

## 2014-02-03 ENCOUNTER — Ambulatory Visit (HOSPITAL_COMMUNITY)
Admission: RE | Admit: 2014-02-03 | Discharge: 2014-02-03 | Disposition: A | Discharge status: Home / Self Care | Payer: Medicaid Other | Source: Ambulatory Visit | Attending: Family Medicine | Admitting: Family Medicine

## 2014-02-03 DIAGNOSIS — O24919 Unspecified diabetes mellitus in pregnancy, unspecified trimester: Secondary | ICD-10-CM | POA: Insufficient documentation

## 2014-02-03 DIAGNOSIS — Z3689 Encounter for other specified antenatal screening: Secondary | ICD-10-CM | POA: Diagnosis not present

## 2014-02-03 DIAGNOSIS — E669 Obesity, unspecified: Secondary | ICD-10-CM

## 2014-02-03 DIAGNOSIS — E119 Type 2 diabetes mellitus without complications: Secondary | ICD-10-CM | POA: Insufficient documentation

## 2014-02-03 DIAGNOSIS — O10019 Pre-existing essential hypertension complicating pregnancy, unspecified trimester: Secondary | ICD-10-CM | POA: Diagnosis not present

## 2014-02-03 DIAGNOSIS — O99210 Obesity complicating pregnancy, unspecified trimester: Secondary | ICD-10-CM

## 2014-02-03 DIAGNOSIS — O10913 Unspecified pre-existing hypertension complicating pregnancy, third trimester: Secondary | ICD-10-CM

## 2014-02-03 DIAGNOSIS — O24913 Unspecified diabetes mellitus in pregnancy, third trimester: Secondary | ICD-10-CM

## 2014-02-03 LAB — COMPREHENSIVE METABOLIC PANEL
ALBUMIN: 3.6 g/dL (ref 3.5–5.2)
ALT: 17 U/L (ref 0–35)
AST: 22 U/L (ref 0–37)
Alkaline Phosphatase: 134 U/L — ABNORMAL HIGH (ref 39–117)
BUN: 7 mg/dL (ref 6–23)
CHLORIDE: 105 meq/L (ref 96–112)
CO2: 25 mEq/L (ref 19–32)
Calcium: 9.4 mg/dL (ref 8.4–10.5)
Creat: 0.62 mg/dL (ref 0.50–1.10)
Glucose, Bld: 105 mg/dL — ABNORMAL HIGH (ref 70–99)
Potassium: 4.6 mEq/L (ref 3.5–5.3)
SODIUM: 138 meq/L (ref 135–145)
TOTAL PROTEIN: 6.4 g/dL (ref 6.0–8.3)
Total Bilirubin: 0.4 mg/dL (ref 0.2–1.2)

## 2014-02-03 NOTE — ED Notes (Signed)
Pt states swelling in hands and feet.  Had labs drawn in the clinic today for CMP and 24hour urine.

## 2014-02-04 LAB — PROTEIN, URINE, 24 HOUR
Protein, 24H Urine: 210 mg/d — ABNORMAL HIGH (ref <150)
Protein, Urine: 8 mg/dL (ref 5–24)

## 2014-02-06 ENCOUNTER — Other Ambulatory Visit: Payer: Self-pay | Admitting: Obstetrics & Gynecology

## 2014-02-06 ENCOUNTER — Other Ambulatory Visit: Payer: Medicaid Other

## 2014-02-06 ENCOUNTER — Ambulatory Visit (INDEPENDENT_AMBULATORY_CARE_PROVIDER_SITE_OTHER): Payer: Medicaid Other | Admitting: Obstetrics & Gynecology

## 2014-02-06 VITALS — BP 139/84 | HR 75 | Temp 98.5°F | Wt 280.4 lb

## 2014-02-06 DIAGNOSIS — O10019 Pre-existing essential hypertension complicating pregnancy, unspecified trimester: Secondary | ICD-10-CM

## 2014-02-06 DIAGNOSIS — O24919 Unspecified diabetes mellitus in pregnancy, unspecified trimester: Secondary | ICD-10-CM

## 2014-02-06 DIAGNOSIS — E119 Type 2 diabetes mellitus without complications: Secondary | ICD-10-CM

## 2014-02-06 DIAGNOSIS — O3421 Maternal care for scar from previous cesarean delivery: Secondary | ICD-10-CM

## 2014-02-06 DIAGNOSIS — O09293 Supervision of pregnancy with other poor reproductive or obstetric history, third trimester: Secondary | ICD-10-CM

## 2014-02-06 DIAGNOSIS — O10913 Unspecified pre-existing hypertension complicating pregnancy, third trimester: Secondary | ICD-10-CM

## 2014-02-06 DIAGNOSIS — O09299 Supervision of pregnancy with other poor reproductive or obstetric history, unspecified trimester: Secondary | ICD-10-CM

## 2014-02-06 DIAGNOSIS — O163 Unspecified maternal hypertension, third trimester: Secondary | ICD-10-CM

## 2014-02-06 DIAGNOSIS — O24913 Unspecified diabetes mellitus in pregnancy, third trimester: Secondary | ICD-10-CM

## 2014-02-06 DIAGNOSIS — I10 Essential (primary) hypertension: Secondary | ICD-10-CM

## 2014-02-06 DIAGNOSIS — O34219 Maternal care for unspecified type scar from previous cesarean delivery: Secondary | ICD-10-CM

## 2014-02-06 DIAGNOSIS — O169 Unspecified maternal hypertension, unspecified trimester: Secondary | ICD-10-CM

## 2014-02-06 LAB — POCT URINALYSIS DIP (DEVICE)
Bilirubin Urine: NEGATIVE
Glucose, UA: NEGATIVE mg/dL
HGB URINE DIPSTICK: NEGATIVE
KETONES UR: NEGATIVE mg/dL
Leukocytes, UA: NEGATIVE
Nitrite: NEGATIVE
PROTEIN: NEGATIVE mg/dL
SPECIFIC GRAVITY, URINE: 1.01 (ref 1.005–1.030)
Urobilinogen, UA: 0.2 mg/dL (ref 0.0–1.0)
pH: 5.5 (ref 5.0–8.0)

## 2014-02-06 NOTE — Patient Instructions (Signed)
Return to clinic for any obstetric concerns or go to MAU for evaluation  

## 2014-02-06 NOTE — Progress Notes (Signed)
States edema is getting worse, has some in feet/ hands, legs.

## 2014-02-06 NOTE — Progress Notes (Signed)
NST performed today was reviewed and was found to be reactive.  Continue recommended antenatal testing and prenatal care. Forgot blood sugars; reports sugars within range. Will reevaluate at next visit.  2+ BLE edema, patient reassured, will continue to monitor. 02/03/14 [redacted]w[redacted]d EFW 7lb 8 oz (3400g)/>90%, AC >97%, AFI 20 cm, cephalic, posterior placenta Patient is nervous given possible macrosomia, pelvis proven to 8-2. Last baby was 9-2, had cesarean section. She was told there was no indication for outright cesarean section at this point.  She desires repeat scan closer to 39 weeks; this will be scheduled. She may change mind about delivery mode pending the results.  Ultrasound fallibility discussed. Pelvic cultures done today. Preeclampsia, labor and fetal movement precautions reviewed.

## 2014-02-07 LAB — GC/CHLAMYDIA PROBE AMP
CT Probe RNA: NEGATIVE
GC Probe RNA: NEGATIVE

## 2014-02-09 LAB — CULTURE, BETA STREP (GROUP B ONLY)

## 2014-02-10 ENCOUNTER — Telehealth: Payer: Self-pay | Admitting: *Deleted

## 2014-02-10 ENCOUNTER — Encounter: Payer: Self-pay | Admitting: Obstetrics & Gynecology

## 2014-02-10 ENCOUNTER — Other Ambulatory Visit: Payer: Medicaid Other

## 2014-02-10 DIAGNOSIS — O10019 Pre-existing essential hypertension complicating pregnancy, unspecified trimester: Secondary | ICD-10-CM

## 2014-02-10 DIAGNOSIS — O24919 Unspecified diabetes mellitus in pregnancy, unspecified trimester: Secondary | ICD-10-CM

## 2014-02-10 NOTE — Telephone Encounter (Signed)
Called pt and left message on her voice mail that I am calling in regard to her missed clinic appt today. I also stated that if she had forgotten or it was on oversight, she may come to clinic anytime today from now until 1610. Her next scheduled appt is 9/3 @ 0830.  Please call back if she has questions.

## 2014-02-13 ENCOUNTER — Other Ambulatory Visit: Payer: Medicaid Other

## 2014-02-13 ENCOUNTER — Telehealth: Payer: Self-pay | Admitting: *Deleted

## 2014-02-13 NOTE — Telephone Encounter (Signed)
Tayley did not show for her nst 8/31 or today 9/3 - she called and canceled today's due to transportation.  Called Ailin to notify her of nst and Ultrasound appointments for next week-that are important to keep. Left message with both appointment dates/ times.

## 2014-02-18 ENCOUNTER — Ambulatory Visit (INDEPENDENT_AMBULATORY_CARE_PROVIDER_SITE_OTHER): Payer: Medicaid Other | Admitting: Advanced Practice Midwife

## 2014-02-18 ENCOUNTER — Encounter (HOSPITAL_COMMUNITY): Payer: Self-pay | Admitting: *Deleted

## 2014-02-18 ENCOUNTER — Inpatient Hospital Stay (HOSPITAL_COMMUNITY)
Admission: AD | Admit: 2014-02-18 | Discharge: 2014-02-18 | Disposition: A | Discharge status: Home / Self Care | Payer: Medicaid Other | Source: Ambulatory Visit | Attending: Family Medicine | Admitting: Family Medicine

## 2014-02-18 VITALS — BP 140/84 | HR 90 | Temp 98.9°F | Wt 281.8 lb

## 2014-02-18 DIAGNOSIS — O24913 Unspecified diabetes mellitus in pregnancy, third trimester: Secondary | ICD-10-CM

## 2014-02-18 DIAGNOSIS — O3421 Maternal care for scar from previous cesarean delivery: Secondary | ICD-10-CM

## 2014-02-18 DIAGNOSIS — O10913 Unspecified pre-existing hypertension complicating pregnancy, third trimester: Secondary | ICD-10-CM

## 2014-02-18 DIAGNOSIS — E119 Type 2 diabetes mellitus without complications: Secondary | ICD-10-CM

## 2014-02-18 DIAGNOSIS — O24919 Unspecified diabetes mellitus in pregnancy, unspecified trimester: Secondary | ICD-10-CM

## 2014-02-18 DIAGNOSIS — Z87891 Personal history of nicotine dependence: Secondary | ICD-10-CM | POA: Diagnosis not present

## 2014-02-18 DIAGNOSIS — O34219 Maternal care for unspecified type scar from previous cesarean delivery: Secondary | ICD-10-CM

## 2014-02-18 DIAGNOSIS — O10019 Pre-existing essential hypertension complicating pregnancy, unspecified trimester: Secondary | ICD-10-CM

## 2014-02-18 LAB — URINALYSIS, ROUTINE W REFLEX MICROSCOPIC
BILIRUBIN URINE: NEGATIVE
Glucose, UA: NEGATIVE mg/dL
HGB URINE DIPSTICK: NEGATIVE
KETONES UR: NEGATIVE mg/dL
Leukocytes, UA: NEGATIVE
Nitrite: NEGATIVE
PH: 6 (ref 5.0–8.0)
Protein, ur: NEGATIVE mg/dL
SPECIFIC GRAVITY, URINE: 1.01 (ref 1.005–1.030)
Urobilinogen, UA: 0.2 mg/dL (ref 0.0–1.0)

## 2014-02-18 LAB — POCT URINALYSIS DIP (DEVICE)
Bilirubin Urine: NEGATIVE
Glucose, UA: NEGATIVE mg/dL
Hgb urine dipstick: NEGATIVE
Ketones, ur: NEGATIVE mg/dL
Leukocytes, UA: NEGATIVE
Nitrite: NEGATIVE
Protein, ur: NEGATIVE mg/dL
Specific Gravity, Urine: <=1.005 (ref 1.005–1.030)
Urobilinogen, UA: 0.2 mg/dL (ref 0.0–1.0)
pH: 5.5 (ref 5.0–8.0)

## 2014-02-18 LAB — COMPREHENSIVE METABOLIC PANEL WITH GFR
ALT: 16 U/L (ref 0–35)
AST: 25 U/L (ref 0–37)
Albumin: 2.9 g/dL — ABNORMAL LOW (ref 3.5–5.2)
Alkaline Phosphatase: 155 U/L — ABNORMAL HIGH (ref 39–117)
Anion gap: 10 (ref 5–15)
BUN: 7 mg/dL (ref 6–23)
CO2: 24 meq/L (ref 19–32)
Calcium: 9.5 mg/dL (ref 8.4–10.5)
Chloride: 104 meq/L (ref 96–112)
Creatinine, Ser: 0.66 mg/dL (ref 0.50–1.10)
GFR calc Af Amer: >90 mL/min
GFR calc non Af Amer: >90 mL/min
Glucose, Bld: 89 mg/dL (ref 70–99)
Potassium: 4.6 meq/L (ref 3.7–5.3)
Sodium: 138 meq/L (ref 137–147)
Total Bilirubin: 0.3 mg/dL (ref 0.3–1.2)
Total Protein: 6.6 g/dL (ref 6.0–8.3)

## 2014-02-18 LAB — CBC
HCT: 34.6 % — ABNORMAL LOW (ref 36.0–46.0)
Hemoglobin: 11.5 g/dL — ABNORMAL LOW (ref 12.0–15.0)
MCH: 27.3 pg (ref 26.0–34.0)
MCHC: 33.2 g/dL (ref 30.0–36.0)
MCV: 82 fL (ref 78.0–100.0)
Platelets: 171 K/uL (ref 150–400)
RBC: 4.22 MIL/uL (ref 3.87–5.11)
RDW: 14.1 % (ref 11.5–15.5)
WBC: 7.2 K/uL (ref 4.0–10.5)

## 2014-02-18 LAB — PROTEIN / CREATININE RATIO, URINE
Creatinine, Urine: 80.57 mg/dL
PROTEIN CREATININE RATIO: 0.14 (ref 0.00–0.15)
Total Protein, Urine: 11.5 mg/dL

## 2014-02-18 LAB — LACTATE DEHYDROGENASE: LDH: 385 U/L — AB (ref 94–250)

## 2014-02-18 MED ORDER — LABETALOL HCL 100 MG PO TABS: 100.0000 mg | ORAL_TABLET | Freq: Two times a day (BID) | ORAL | Status: DC

## 2014-02-18 NOTE — MAU Note (Signed)
Sent from office for further BP evaluation.

## 2014-02-18 NOTE — MAU Note (Signed)
Urine in lab 

## 2014-02-18 NOTE — MAU Provider Note (Signed)
Attestation of Attending Supervision of Advanced Practitioner (PA/CNM/NP): Evaluation and management procedures were performed by the Advanced Practitioner under my supervision and collaboration.  I have reviewed the Advanced Practitioner's note and chart, and I agree with the management and plan.  Muneeb Veras, DO Attending Physician Faculty Practice, [facility]  

## 2014-02-18 NOTE — MAU Provider Note (Signed)
First Provider Initiated Contact with Patient 02/18/14 1325      Chief Complaint:  Hypertension   Caitlyn [name] is  33 y.o. [id number] at [redacted]w[redacted]d presents complaining of Hypertension The patient has a h/o chronic HTN and gestational DM.  She had a headache today x 2 days. Notes the headache has improved some today. Noted some swelling on the inferior aspect of her abdomen that has gradually progressed.   Yesterday her eyes were puffy as well. She worries because she was placed in the ICU after her last C-section in 2011 due to hypertension and facial swelling. She endorses occasional SOB with ambulation. No chest pain. Denies RUQ or visual disturbances. No change in urination. From reviewing the EMR, she had severe pre-eclampsia and was placed on magnesium sulfate as prophylaxis.  She was discharged with several antihypertensives which she did not continue.  The patient presents to the MAU as she was found to have elevated BPs in clinic today; 140s/80s.   She was supposed to take labetalol  BID during this pregnancy but has not due because she felt her BPs have been good.    She has irregular contractions q85minutes that sometimes space to 1hr. No LOF, vaginal bleeding, or vaginal discharge. Good fetal movement  Of note, the patient would like to have a rLTCS with this baby as well as a BTL. She has good f/u in [facility] and will be seen again on Thursday, 9/10.   Obstetrical/Gynecological History: OB History   Grav Para Term Preterm Abortions TAB SAB Ect Mult Living   Past Medical History: Past Medical History  Diagnosis Date  . Hypertension     Past Surgical History: Past Surgical History  Procedure Laterality Date  . Cesarean section  03/13/2010    Family History: History reviewed. No pertinent family history.  Social History: History  Substance Use Topics  . Smoking status: Former Smoker    Quit date: 06/13/2008  . Smokeless tobacco: Never Used  .  Alcohol Use: No    Allergies:  Allergies  Allergen Reactions  . Codeine Anaphylaxis and Swelling    Puffy eyes    Meds:  Prescriptions prior to admission  Medication Sig Dispense Refill  . Prenatal Vit-Fe Fumarate-FA (PRENATAL MULTIVITAMIN) TABS tablet Take 1 tablet by mouth daily at 12 noon.      Marland Kitchen ACCU-CHEK FASTCLIX LANCETS MISC Check blood sugar 4 times daily.  100 each  12  . glucose blood test strip Use as instructed  100 each  12    Review of Systems -   Review of Systems  Constitutional: Negative for fever, chills, weight loss, malaise/fatigue and diaphoresis.  HENT: Negative for hearing loss, ear pain, nosebleeds, congestion, sore throat, neck pain, tinnitus and ear discharge.   Eyes: Negative for blurred vision, double vision, photophobia, pain, discharge and redness.  Respiratory: Negative for cough, hemoptysis, sputum production, shortness of breath, wheezing and stridor.   Cardiovascular: Negative for chest pain, palpitations, orthopnea,  Positive for leg swelling  Gastrointestinal: Negative for abdominal pain heartburn, nausea, vomiting, diarrhea, constipation, blood in stool Genitourinary: Negative for dysuria, urgency, frequency, hematuria and flank pain.  Musculoskeletal: Negative for myalgias, back pain, joint pain and falls.  Skin: Negative for itching and rash.  Neurological: Negative for dizziness, tingling, tremors, sensory change, speech change, focal weakness, seizures, loss of consciousness, weakness. Positive for headaches.  Endo/Heme/Allergies: Negative for environmental allergies and polydipsia. Does  not bruise/bleed easily.  Psychiatric/Behavioral: Negative for depression, suicidal ideas, hallucinations, memory loss and substance abuse. The patient is not nervous/anxious and does not have insomnia.      Physical Exam  Blood pressure 142/76, pulse 75, temperature 98.3 F (36.8 C), temperature source Oral, resp. rate 18, last menstrual period  05/27/2013. GENERAL: Well-developed, well-nourished female in no acute distress.  LUNGS: Clear to auscultation bilaterally.  HEART: Regular rate and rhythm. ABDOMEN: Soft, nontender, nondistended, gravid.  EXTREMITIES: Nontender, no edema, 2+ distal pulses. DTR's 2+ FHT:  Baseline rate 130 bpm   Variability moderate  Accelerations present   Decelerations none Contractions: irregular   Labs: Results for orders placed during the hospital encounter of 02/18/14 (from the past 24 hour(s))  PROTEIN / CREATININE RATIO, URINE   Collection Time    02/18/14 12:32 PM      Result Value Ref Range   Creatinine, Urine 80.57     Total Protein, Urine 11.5     PROTEIN CREATININE RATIO 0.14  0.00 - 0.15  URINALYSIS, ROUTINE W REFLEX MICROSCOPIC   Collection Time    02/18/14 12:32 PM      Result Value Ref Range   Color, Urine YELLOW  YELLOW   APPearance CLEAR  CLEAR   Specific Gravity, Urine 1.010  1.005 - 1.030   pH 6.0  5.0 - 8.0   Glucose, UA NEGATIVE  NEGATIVE mg/dL   Hgb urine dipstick NEGATIVE  NEGATIVE   Bilirubin Urine NEGATIVE  NEGATIVE   Ketones, ur NEGATIVE  NEGATIVE mg/dL   Protein, ur NEGATIVE  NEGATIVE mg/dL   Urobilinogen, UA 0.2  0.0 - 1.0 mg/dL   Nitrite NEGATIVE  NEGATIVE   Leukocytes, UA NEGATIVE  NEGATIVE  CBC   Collection Time    02/18/14  1:42 PM      Result Value Ref Range   WBC 7.2  4.0 - 10.5 K/uL   RBC 4.22  3.87 - 5.11 MIL/uL   Hemoglobin 11.5 (*) 12.0 - 15.0 g/dL   HCT 16.1 (*) 09.6 - 04.5 %   MCV 82.0  78.0 - 100.0 fL   MCH 27.3  26.0 - 34.0 pg   MCHC 33.2  30.0 - 36.0 g/dL   RDW 40.9  81.1 - 91.4 %   Platelets 171  150 - 400 K/uL  COMPREHENSIVE METABOLIC PANEL   Collection Time    02/18/14  1:42 PM      Result Value Ref Range   Sodium 138  137 - 147 mEq/L   Potassium 4.6  3.7 - 5.3 mEq/L   Chloride 104  96 - 112 mEq/L   CO2 24  19 - 32 mEq/L   Glucose, Bld 89  70 - 99 mg/dL   BUN 7  6 - 23 mg/dL   Creatinine, Ser 7.82  0.50 - 1.10 mg/dL   Calcium  9.5  8.4 - 95.6 mg/dL   Total Protein 6.6  6.0 - 8.3 g/dL   Albumin 2.9 (*) 3.5 - 5.2 g/dL   AST 25  0 - 37 U/L   ALT 16  0 - 35 U/L   Alkaline Phosphatase 155 (*) 39 - 117 U/L   Total Bilirubin 0.3  0.3 - 1.2 mg/dL   GFR calc non Af Amer >90  >90 mL/min   GFR calc Af Amer >90  >90 mL/min   Anion gap 10  5 - 15  LACTATE DEHYDROGENASE   Collection Time    02/18/14  1:42 PM  Result Value Ref Range   LDH 385 (*) 94 - 250 U/L  POCT URINALYSIS DIP (DEVICE)   Collection Time    02/18/14 10:18 AM      Result Value Ref Range   Glucose, UA NEGATIVE  NEGATIVE mg/dL   Bilirubin Urine NEGATIVE  NEGATIVE   Ketones, ur NEGATIVE  NEGATIVE mg/dL   Specific Gravity, Urine <=1.005  1.005 - 1.030   Hgb urine dipstick NEGATIVE  NEGATIVE   pH 5.5  5.0 - 8.0   Protein, ur NEGATIVE  NEGATIVE mg/dL   Urobilinogen, UA 0.2  0.0 - 1.0 mg/dL   Nitrite NEGATIVE  NEGATIVE   Leukocytes, UA NEGATIVE  NEGATIVE   Imaging Studies:  US Ob Follow Up  02/03/2014   OBSTETRICAL ULTRASOUND: This exam was performed within a [facility]. The OB US report was generated in the AS system, and faxed to the ordering physician.   This report is available in the [employer]. See the [facility] Obstetric US report via the Image Link.   Assessment: SHERREL PLOCH is  33 y.o. [phone] at [redacted]w[redacted]d presents with elevated BPs and concerns of swelling.  Her BPs in the MAU ranged from 140-130s/80-70s and protein/creatinine ratio of 0.14. LFTs, creatinine, and platelets normal. Some elevation in LDH, however with no other abnormalities. Discussed these results with Dr. Adrian Blackwater given the history of postpartum pre-eclampsia in 2011. He felt given that no other labs were elevated and there were no severe range pressures, the patient would be safe for discharge with follow up at her scheduled appt on Thursday.   Plan: - Discharge home  - Start taking labetalol  BID - Discussed pre-E precautions - Advised to keep f/u  on Thursday - Labor precautions and kick counts discussed  Joanna Puff 9/8/20153:02 PM   I was present for the exam and agree with above.  [place], [place] 02/18/2014 10:53 PM

## 2014-02-18 NOTE — Discharge Instructions (Signed)

## 2014-02-18 NOTE — Progress Notes (Signed)
Patient reports not taking labetalol or aspirin .  Patient reports edema in pelvic region "like a round balloon hanging from my stomach, feels like water hanging from it." Patient reports having swollen eyes and headache yesterday- called to MAU and was told to come to hospital but did not.  [place] for growth on 9/10 - pt informed of appt

## 2014-02-18 NOTE — Progress Notes (Signed)
Doing well.  Good fetal movement, denies vaginal bleeding, LOF, regular contractions.  NST-R today.  Glucose log reviewed:  Fastings 69-90 , PP 70-113.  H/A x 2 days, less today than yesterday, no epigastric pain or visual disturbances.  Has edema of lower abdomen, soft but mildly tender to palpation. Sent to MAU for further eval of preeclampsia.

## 2014-02-19 ENCOUNTER — Other Ambulatory Visit: Payer: Self-pay | Admitting: Advanced Practice Midwife

## 2014-02-19 ENCOUNTER — Encounter: Payer: Self-pay | Admitting: *Deleted

## 2014-02-19 DIAGNOSIS — G4700 Insomnia, unspecified: Secondary | ICD-10-CM

## 2014-02-20 ENCOUNTER — Ambulatory Visit (HOSPITAL_COMMUNITY)
Admission: RE | Admit: 2014-02-20 | Discharge: 2014-02-20 | Disposition: A | Discharge status: Home / Self Care | Payer: Medicaid Other | Source: Ambulatory Visit | Attending: Obstetrics & Gynecology | Admitting: Obstetrics & Gynecology

## 2014-02-20 ENCOUNTER — Ambulatory Visit (INDEPENDENT_AMBULATORY_CARE_PROVIDER_SITE_OTHER): Payer: Medicaid Other | Admitting: Obstetrics & Gynecology

## 2014-02-20 VITALS — BP 152/88 | HR 84 | Wt 281.3 lb

## 2014-02-20 DIAGNOSIS — Z23 Encounter for immunization: Secondary | ICD-10-CM

## 2014-02-20 DIAGNOSIS — O10019 Pre-existing essential hypertension complicating pregnancy, unspecified trimester: Secondary | ICD-10-CM

## 2014-02-20 DIAGNOSIS — E669 Obesity, unspecified: Secondary | ICD-10-CM | POA: Insufficient documentation

## 2014-02-20 DIAGNOSIS — O3660X0 Maternal care for excessive fetal growth, unspecified trimester, not applicable or unspecified: Secondary | ICD-10-CM

## 2014-02-20 DIAGNOSIS — O34219 Maternal care for unspecified type scar from previous cesarean delivery: Secondary | ICD-10-CM | POA: Insufficient documentation

## 2014-02-20 DIAGNOSIS — O24919 Unspecified diabetes mellitus in pregnancy, unspecified trimester: Secondary | ICD-10-CM | POA: Diagnosis present

## 2014-02-20 DIAGNOSIS — O99210 Obesity complicating pregnancy, unspecified trimester: Secondary | ICD-10-CM | POA: Diagnosis not present

## 2014-02-20 DIAGNOSIS — I10 Essential (primary) hypertension: Secondary | ICD-10-CM

## 2014-02-20 DIAGNOSIS — O3663X0 Maternal care for excessive fetal growth, third trimester, not applicable or unspecified: Secondary | ICD-10-CM | POA: Insufficient documentation

## 2014-02-20 DIAGNOSIS — O3421 Maternal care for scar from previous cesarean delivery: Secondary | ICD-10-CM

## 2014-02-20 DIAGNOSIS — O3663X1 Maternal care for excessive fetal growth, third trimester, fetus 1: Secondary | ICD-10-CM

## 2014-02-20 DIAGNOSIS — O99211 Obesity complicating pregnancy, first trimester: Secondary | ICD-10-CM

## 2014-02-20 LAB — POCT URINALYSIS DIP (DEVICE)
BILIRUBIN URINE: NEGATIVE
Glucose, UA: NEGATIVE mg/dL
HGB URINE DIPSTICK: NEGATIVE
Ketones, ur: NEGATIVE mg/dL
Leukocytes, UA: NEGATIVE
NITRITE: NEGATIVE
PH: 5.5 (ref 5.0–8.0)
PROTEIN: NEGATIVE mg/dL
Specific Gravity, Urine: <=1.005 (ref 1.005–1.030)
UROBILINOGEN UA: 0.2 mg/dL (ref 0.0–1.0)

## 2014-02-20 NOTE — Progress Notes (Signed)
02/20/14 [redacted]w[redacted]d EFW 10lb 15 oz (4956g)/>90%, AC >97%, AFI 20 cm, cephalic  Given EFW >4500g and Class B DM, RCS recommended.  Discussed risks of shoulder dystocia, increased risks of fetal morbidity/mortality. Patient agrees to have RCS, BTL scheduled. RCS and BTL scheduled with Dr. Adrian Blackwater on 02/25/14 at 11:30 am, preoperative instructions given to patient BP elevated, did not take Labetalol today. No symptoms of preeclampsia; precautions reviewed.  Will take medication once she gets home and continue to directed. NST performed today was reviewed and was found to be reactive.  No other complaints or concerns.  Preeclampsia, labor and fetal movement precautions reviewed.

## 2014-02-20 NOTE — Progress Notes (Signed)
NST/OBF:  Pt has ultrasound upstairs today, results in media.

## 2014-02-20 NOTE — Patient Instructions (Signed)
Cesarean Delivery  Cesarean delivery is the birth of a baby through a cut (incision) in the abdomen and womb (uterus).  LET YOUR HEALTH CARE PROVIDER KNOW ABOUT:  All medicines you are taking, including vitamins, herbs, eye drops, creams, and over-the-counter medicines.  Previous problems you or members of your family have had with the use of anesthetics.  Any blood disorders you have.  Previous surgeries you have had.  Medical conditions you have.  Any allergies you have.  Complicationsinvolving the pregnancy. RISKS AND COMPLICATIONS  Generally, this is a safe procedure. However, as with any procedure, complications can occur. Possible complications include:  Bleeding.  Infection.  Blood clots.  Injury to surrounding organs.  Problems with anesthesia.  Injury to the baby. BEFORE THE PROCEDURE   You may be given an antacid medicine to drink. This will prevent acid contents in your stomach from going into your lungs if you vomit during the surgery.  You may be given an antibiotic medicine to prevent infection. PROCEDURE   Hair may be removed from your pubic area and your lower abdomen. This is to prevent infection in the incision site.  A tube (Foley catheter) will be placed in your bladder to drain your urine from your bladder into a bag. This keeps your bladder empty during surgery.  An IV tube will be placed in your vein.  You may be given medicine to numb the lower half of your body (regional anesthetic). If you were in labor, you may have already had an epidural in place which can be used in both labor and cesarean delivery. You may possibly be given medicine to make you sleep (general anesthetic) though this is not as common.  An incision will be made in your abdomen that extends to your uterus. There are 2 basic kinds of incisions:  The horizontal (transverse) incision. Horizontal incisions are from side to side and are used for most routine cesarean  deliveries.  The vertical incision. The vertical incision is from the top of the abdomen to the bottom and is less commonly used. It is often done for women who have a serious complication (extreme prematurity) or under emergency situations.  The horizontal and vertical incisions may both be used at the same time. However, this is very uncommon.  An incision is then made in your uterus to deliver the baby.  Your baby will then be delivered.  Both incisions are then closed with absorbable stitches. AFTER THE PROCEDURE   If you were awake during the surgery, you will see your baby right away. If you were asleep, you will see your baby as soon as you are awake.  You may breastfeed your baby after surgery.  You may be able to get up and walk the same day as the surgery. If you need to stay in bed for a period of time, you will receive help to turn, cough, and take deep breaths after surgery. This helps prevent lung problems such as pneumonia.  Do not get out of bed alone the first time after surgery. You will need help getting out of bed until you are able to do this by yourself.  You may be able to shower the day after your cesarean delivery. After the bandage (dressing) is taken off the incision site, a nurse will assist you to shower if you would like help.  You will have pneumatic compression hose placed on your lower legs. This is done to prevent blood clots. When you are up   and walking regularly, they will no longer be necessary.  Do not cross your legs when you sit.  Save any blood clots that you pass. If you pass a clot while on the toilet, do not flush it. Call for the nurse. Tell the nurse if you think you are bleeding too much or passing too many clots.  You will be given medicine as needed. Let your health care providers know if you are hurting. You may also be given an antibiotic to prevent an infection.  Your IV tube will be taken out when you are drinking a reasonable  amount of fluids. The Foley catheter is taken out when you are up and walking.  If your blood type is Rh negative and your baby's blood type is Rh positive, you will be given a shot of anti-D immune globulin. This shot prevents you from having Rh problems with a future pregnancy. You should get the shot even if you had your tubes tied (tubal ligation).  If you are allowed to take the baby for a walk, place the baby in the bassinet and push it. Do not carry your baby in your arms. Document Released: 05/30/2005 Document Revised: 03/20/2013 Document Reviewed: 12/19/2012 ExitCare Patient Information 2015 [employer]. This information is not intended to replace advice given to you by your health care provider. Make sure you discuss any questions you have with your health care provider.  

## 2014-02-21 ENCOUNTER — Encounter (HOSPITAL_COMMUNITY): Payer: Self-pay

## 2014-02-24 ENCOUNTER — Encounter (HOSPITAL_COMMUNITY)
Admission: RE | Admit: 2014-02-24 | Discharge: 2014-02-24 | Disposition: A | Discharge status: Home / Self Care | Payer: Medicaid Other | Source: Ambulatory Visit | Attending: Obstetrics | Admitting: Obstetrics

## 2014-02-24 ENCOUNTER — Encounter (HOSPITAL_COMMUNITY): Payer: Self-pay

## 2014-02-24 ENCOUNTER — Other Ambulatory Visit: Payer: Medicaid Other

## 2014-02-24 ENCOUNTER — Encounter: Payer: Self-pay | Admitting: Obstetrics & Gynecology

## 2014-02-24 HISTORY — DX: Sleep apnea, unspecified: G47.30

## 2014-02-24 HISTORY — DX: Type 2 diabetes mellitus without complications: E11.9

## 2014-02-24 HISTORY — DX: Anemia, unspecified: D64.9

## 2014-02-24 LAB — BASIC METABOLIC PANEL
Anion gap: 11 (ref 5–15)
BUN: 7 mg/dL (ref 6–23)
CALCIUM: 9.4 mg/dL (ref 8.4–10.5)
CO2: 23 meq/L (ref 19–32)
CREATININE: 0.78 mg/dL (ref 0.50–1.10)
Chloride: 103 mEq/L (ref 96–112)
GFR calc Af Amer: >90 mL/min (ref >90)
GFR calc non Af Amer: >90 mL/min (ref >90)
GLUCOSE: 107 mg/dL — AB (ref 70–99)
Potassium: 4.4 mEq/L (ref 3.7–5.3)
Sodium: 137 mEq/L (ref 137–147)

## 2014-02-24 LAB — CBC
HCT: 32.9 % — ABNORMAL LOW (ref 36.0–46.0)
Hemoglobin: 11.1 g/dL — ABNORMAL LOW (ref 12.0–15.0)
MCH: 27.3 pg (ref 26.0–34.0)
MCHC: 33.7 g/dL (ref 30.0–36.0)
MCV: 81 fL (ref 78.0–100.0)
Platelets: 159 10*3/uL (ref 150–400)
RBC: 4.06 MIL/uL (ref 3.87–5.11)
RDW: 14 % (ref 11.5–15.5)
WBC: 6.6 10*3/uL (ref 4.0–10.5)

## 2014-02-24 MED ORDER — DEXTROSE 5 % IV SOLN
2.0000 g | INTRAVENOUS | Status: AC
  Administered 2014-02-25: 2 g via INTRAVENOUS
  Filled 2014-02-24: qty 2

## 2014-02-24 NOTE — Patient Instructions (Addendum)
   Your procedure is scheduled on: Tuesday, Sept 15  Enter through the [employer] of [facility] at: 10 AM Pick up the phone at the desk and dial [phone] and inform us of your arrival.  Please call this number if you have any problems the morning of surgery: [phone]  Remember: Do not eat food after midnight: Monday Do not drink clear liquids after: 7 AM Tuesday, day of surgery Take these medicines the morning of surgery with a SIP OF WATER: labetalol  Do not wear jewelry, make-up, or FINGER nail polish No metal in your hair or on your body. Do not wear lotions, powders, perfumes.  You may wear deodorant.  Do not bring valuables to the hospital. Contacts, dentures or bridgework may not be worn into surgery.  Leave suitcase in the car. After Surgery it may be brought to your room. For patients being admitted to the hospital, checkout time is 11:00am the day of discharge.  Home with [facility] cell [phone]

## 2014-02-24 NOTE — Patient Instructions (Signed)
   Your procedure is scheduled on: Tuesday, Sept 15  Enter through the [employer] of [facility] at: 10 AM Pick up the phone at the desk and dial [phone] and inform us of your arrival.  Please call this number if you have any problems the morning of surgery: [phone]  Remember: Do not eat food after midnight: Tonight - Monday Do not drink clear liquids after: 7 AM Tuesday, day of surgery Take these medicines the morning of surgery with a SIP OF WATER:  Do not wear jewelry, make-up, or FINGER nail polish No metal in your hair or on your body. Do not wear lotions, powders, perfumes.  You may wear deodorant.  Do not bring valuables to the hospital. Contacts, dentures or bridgework may not be worn into surgery.  Leave suitcase in the car. After Surgery it may be brought to your room. For patients being admitted to the hospital, checkout time is 11:00am the day of discharge.

## 2014-02-25 ENCOUNTER — Inpatient Hospital Stay (HOSPITAL_COMMUNITY)
Admission: RE | Admit: 2014-02-25 | Discharge: 2014-02-27 | DRG: 765 | Disposition: A | Discharge status: Home / Self Care | Payer: Medicaid Other | Source: Ambulatory Visit | Attending: Family Medicine | Admitting: Family Medicine

## 2014-02-25 ENCOUNTER — Encounter (HOSPITAL_COMMUNITY)
Admission: RE | Disposition: A | Discharge status: Home / Self Care | Payer: Self-pay | Source: Ambulatory Visit | Attending: Family Medicine

## 2014-02-25 ENCOUNTER — Encounter (HOSPITAL_COMMUNITY): Payer: Medicaid Other | Admitting: Anesthesiology

## 2014-02-25 ENCOUNTER — Inpatient Hospital Stay (HOSPITAL_COMMUNITY): Payer: Medicaid Other | Admitting: Anesthesiology

## 2014-02-25 ENCOUNTER — Encounter (HOSPITAL_COMMUNITY): Payer: Self-pay | Admitting: Family Medicine

## 2014-02-25 DIAGNOSIS — Z6841 Body Mass Index (BMI) 40.0 and over, adult: Secondary | ICD-10-CM | POA: Diagnosis not present

## 2014-02-25 DIAGNOSIS — Z302 Encounter for sterilization: Secondary | ICD-10-CM | POA: Diagnosis not present

## 2014-02-25 DIAGNOSIS — E119 Type 2 diabetes mellitus without complications: Secondary | ICD-10-CM | POA: Diagnosis present

## 2014-02-25 DIAGNOSIS — E669 Obesity, unspecified: Secondary | ICD-10-CM | POA: Diagnosis present

## 2014-02-25 DIAGNOSIS — G4730 Sleep apnea, unspecified: Secondary | ICD-10-CM | POA: Diagnosis present

## 2014-02-25 DIAGNOSIS — O1002 Pre-existing essential hypertension complicating childbirth: Secondary | ICD-10-CM | POA: Diagnosis present

## 2014-02-25 DIAGNOSIS — O2432 Unspecified pre-existing diabetes mellitus in childbirth: Secondary | ICD-10-CM | POA: Diagnosis present

## 2014-02-25 DIAGNOSIS — O34219 Maternal care for unspecified type scar from previous cesarean delivery: Principal | ICD-10-CM | POA: Diagnosis present

## 2014-02-25 DIAGNOSIS — O99214 Obesity complicating childbirth: Secondary | ICD-10-CM

## 2014-02-25 DIAGNOSIS — Z87891 Personal history of nicotine dependence: Secondary | ICD-10-CM

## 2014-02-25 DIAGNOSIS — O3660X0 Maternal care for excessive fetal growth, unspecified trimester, not applicable or unspecified: Secondary | ICD-10-CM | POA: Diagnosis present

## 2014-02-25 DIAGNOSIS — O09293 Supervision of pregnancy with other poor reproductive or obstetric history, third trimester: Secondary | ICD-10-CM

## 2014-02-25 DIAGNOSIS — Z98891 History of uterine scar from previous surgery: Secondary | ICD-10-CM

## 2014-02-25 LAB — GLUCOSE, CAPILLARY
GLUCOSE-CAPILLARY: 97 mg/dL (ref 70–99)
Glucose-Capillary: 92 mg/dL (ref 70–99)

## 2014-02-25 LAB — RPR

## 2014-02-25 LAB — PREPARE RBC (CROSSMATCH)

## 2014-02-25 SURGERY — Surgical Case
Anesthesia: Epidural | Site: Abdomen

## 2014-02-25 MED ORDER — ZOLPIDEM TARTRATE 5 MG PO TABS: 5.0000 mg | ORAL_TABLET | Freq: Every evening | ORAL | Status: DC | PRN

## 2014-02-25 MED ORDER — KETOROLAC TROMETHAMINE 30 MG/ML IJ SOLN
30.0000 mg | Freq: Four times a day (QID) | INTRAMUSCULAR | Status: AC | PRN
Start: 2014-02-25 — End: 2014-02-26
  Administered 2014-02-25: 30 mg via INTRAMUSCULAR

## 2014-02-25 MED ORDER — MEPERIDINE HCL 25 MG/ML IJ SOLN
INTRAMUSCULAR | Status: AC
  Filled 2014-02-25: qty 1

## 2014-02-25 MED ORDER — LACTATED RINGERS IV SOLN
INTRAVENOUS | Status: DC
  Administered 2014-02-26: 05:00:00 via INTRAVENOUS

## 2014-02-25 MED ORDER — SENNOSIDES-DOCUSATE SODIUM 8.6-50 MG PO TABS
2.0000 | ORAL_TABLET | ORAL | Status: DC
  Administered 2014-02-26 – 2014-02-27 (×2): 2 via ORAL
  Filled 2014-02-25 (×2): qty 2

## 2014-02-25 MED ORDER — PRENATAL MULTIVITAMIN CH
1.0000 | ORAL_TABLET | Freq: Every day | ORAL | Status: DC
  Administered 2014-02-26 – 2014-02-27 (×2): 1 via ORAL
  Filled 2014-02-25 (×2): qty 1

## 2014-02-25 MED ORDER — METOCLOPRAMIDE HCL 5 MG/ML IJ SOLN: 10.0000 mg | Freq: Three times a day (TID) | INTRAMUSCULAR | Status: DC | PRN

## 2014-02-25 MED ORDER — NALBUPHINE HCL 10 MG/ML IJ SOLN: 5.0000 mg | INTRAMUSCULAR | Status: DC | PRN

## 2014-02-25 MED ORDER — MEPERIDINE HCL 25 MG/ML IJ SOLN: 6.2500 mg | INTRAMUSCULAR | Status: DC | PRN

## 2014-02-25 MED ORDER — MORPHINE SULFATE (PF) 0.5 MG/ML IJ SOLN
INTRAMUSCULAR | Status: DC | PRN
  Administered 2014-02-25: .1 mg via EPIDURAL

## 2014-02-25 MED ORDER — ONDANSETRON HCL 4 MG/2ML IJ SOLN: 4.0000 mg | Freq: Three times a day (TID) | INTRAMUSCULAR | Status: DC | PRN

## 2014-02-25 MED ORDER — KETOROLAC TROMETHAMINE 30 MG/ML IJ SOLN
INTRAMUSCULAR | Status: AC
  Administered 2014-02-25: 30 mg via INTRAMUSCULAR
  Filled 2014-02-25: qty 1

## 2014-02-25 MED ORDER — OXYTOCIN 40 UNITS IN LACTATED RINGERS INFUSION - SIMPLE MED: 62.5000 mL/h | INTRAVENOUS | Status: AC

## 2014-02-25 MED ORDER — SIMETHICONE 80 MG PO CHEW: 80.0000 mg | CHEWABLE_TABLET | ORAL | Status: DC | PRN

## 2014-02-25 MED ORDER — DIBUCAINE 1 % RE OINT: 1.0000 "application " | TOPICAL_OINTMENT | RECTAL | Status: DC | PRN

## 2014-02-25 MED ORDER — OXYCODONE-ACETAMINOPHEN 5-325 MG PO TABS
1.0000 | ORAL_TABLET | ORAL | Status: DC | PRN
  Administered 2014-02-27 (×2): 1 via ORAL
  Filled 2014-02-25 (×3): qty 1

## 2014-02-25 MED ORDER — ASPIRIN EC 81 MG PO TBEC
81.0000 mg | DELAYED_RELEASE_TABLET | Freq: Every day | ORAL | Status: DC
  Filled 2014-02-25 (×4): qty 1

## 2014-02-25 MED ORDER — SCOPOLAMINE 1 MG/3DAYS TD PT72: 1.0000 | MEDICATED_PATCH | Freq: Once | TRANSDERMAL | Status: DC

## 2014-02-25 MED ORDER — FENTANYL CITRATE 0.05 MG/ML IJ SOLN
INTRAMUSCULAR | Status: AC
  Filled 2014-02-25: qty 2

## 2014-02-25 MED ORDER — OXYTOCIN 10 UNIT/ML IJ SOLN
INTRAMUSCULAR | Status: AC
  Filled 2014-02-25: qty 4

## 2014-02-25 MED ORDER — DIPHENHYDRAMINE HCL 50 MG/ML IJ SOLN: 25.0000 mg | INTRAMUSCULAR | Status: DC | PRN

## 2014-02-25 MED ORDER — SIMETHICONE 80 MG PO CHEW
80.0000 mg | CHEWABLE_TABLET | Freq: Three times a day (TID) | ORAL | Status: DC
  Administered 2014-02-26 – 2014-02-27 (×5): 80 mg via ORAL
  Filled 2014-02-25 (×5): qty 1

## 2014-02-25 MED ORDER — ONDANSETRON HCL 4 MG PO TABS: 4.0000 mg | ORAL_TABLET | ORAL | Status: DC | PRN

## 2014-02-25 MED ORDER — LACTATED RINGERS IV SOLN
INTRAVENOUS | Status: DC
  Administered 2014-02-25 (×2): via INTRAVENOUS

## 2014-02-25 MED ORDER — NALOXONE HCL 1 MG/ML IJ SOLN
1.0000 ug/kg/h | INTRAMUSCULAR | Status: DC | PRN
  Filled 2014-02-25: qty 2

## 2014-02-25 MED ORDER — IBUPROFEN 600 MG PO TABS
600.0000 mg | ORAL_TABLET | Freq: Four times a day (QID) | ORAL | Status: DC
  Administered 2014-02-26 – 2014-02-27 (×7): 600 mg via ORAL
  Filled 2014-02-25 (×7): qty 1

## 2014-02-25 MED ORDER — FENTANYL CITRATE 0.05 MG/ML IJ SOLN
INTRAMUSCULAR | Status: DC | PRN
  Administered 2014-02-25 (×4): 25 ug via INTRAVENOUS

## 2014-02-25 MED ORDER — PRENATAL MULTIVITAMIN CH: 1.0000 | ORAL_TABLET | Freq: Every day | ORAL | Status: DC

## 2014-02-25 MED ORDER — LACTATED RINGERS IV SOLN
INTRAVENOUS | Status: DC | PRN
  Administered 2014-02-25 (×2): via INTRAVENOUS

## 2014-02-25 MED ORDER — FENTANYL CITRATE 0.05 MG/ML IJ SOLN: 25.0000 ug | INTRAMUSCULAR | Status: DC | PRN

## 2014-02-25 MED ORDER — BUPIVACAINE HCL (PF) 0.25 % IJ SOLN
INTRAMUSCULAR | Status: DC | PRN
  Administered 2014-02-25: 30 mL

## 2014-02-25 MED ORDER — LACTATED RINGERS IV SOLN: INTRAVENOUS | Status: DC

## 2014-02-25 MED ORDER — MORPHINE SULFATE 0.5 MG/ML IJ SOLN
INTRAMUSCULAR | Status: AC
  Filled 2014-02-25: qty 10

## 2014-02-25 MED ORDER — MENTHOL 3 MG MT LOZG: 1.0000 | LOZENGE | OROMUCOSAL | Status: DC | PRN

## 2014-02-25 MED ORDER — LACTATED RINGERS IV SOLN
40.0000 [IU] | INTRAVENOUS | Status: DC | PRN
  Administered 2014-02-25: 40 [IU] via INTRAVENOUS

## 2014-02-25 MED ORDER — WITCH HAZEL-GLYCERIN EX PADS: 1.0000 "application " | MEDICATED_PAD | CUTANEOUS | Status: DC | PRN

## 2014-02-25 MED ORDER — DIPHENHYDRAMINE HCL 50 MG/ML IJ SOLN: 12.5000 mg | INTRAMUSCULAR | Status: DC | PRN

## 2014-02-25 MED ORDER — DIPHENHYDRAMINE HCL 25 MG PO CAPS
25.0000 mg | ORAL_CAPSULE | ORAL | Status: DC | PRN
  Filled 2014-02-25: qty 1

## 2014-02-25 MED ORDER — ONDANSETRON HCL 4 MG/2ML IJ SOLN
INTRAMUSCULAR | Status: AC
  Filled 2014-02-25: qty 2

## 2014-02-25 MED ORDER — PHENYLEPHRINE HCL 10 MG/ML IJ SOLN
INTRAMUSCULAR | Status: DC | PRN
  Administered 2014-02-25 (×2): 40 ug via INTRAVENOUS
  Administered 2014-02-25: 120 ug via INTRAVENOUS

## 2014-02-25 MED ORDER — KETOROLAC TROMETHAMINE 30 MG/ML IJ SOLN: 30.0000 mg | Freq: Four times a day (QID) | INTRAMUSCULAR | Status: AC | PRN

## 2014-02-25 MED ORDER — SCOPOLAMINE 1 MG/3DAYS TD PT72
MEDICATED_PATCH | TRANSDERMAL | Status: AC
  Administered 2014-02-25: 1.5 mg via TRANSDERMAL
  Filled 2014-02-25: qty 1

## 2014-02-25 MED ORDER — BUPIVACAINE HCL (PF) 0.25 % IJ SOLN
INTRAMUSCULAR | Status: AC
Start: 2014-02-25 — End: 2014-02-25
  Filled 2014-02-25: qty 30

## 2014-02-25 MED ORDER — SIMETHICONE 80 MG PO CHEW
80.0000 mg | CHEWABLE_TABLET | ORAL | Status: DC
  Administered 2014-02-26 – 2014-02-27 (×2): 80 mg via ORAL
  Filled 2014-02-25 (×2): qty 1

## 2014-02-25 MED ORDER — MEPERIDINE HCL 25 MG/ML IJ SOLN
INTRAMUSCULAR | Status: DC | PRN
  Administered 2014-02-25 (×2): 12.5 mg via INTRAVENOUS

## 2014-02-25 MED ORDER — SCOPOLAMINE 1 MG/3DAYS TD PT72
1.0000 | MEDICATED_PATCH | Freq: Once | TRANSDERMAL | Status: AC
  Administered 2014-02-25: 1.5 mg via TRANSDERMAL

## 2014-02-25 MED ORDER — ONDANSETRON HCL 4 MG/2ML IJ SOLN: 4.0000 mg | INTRAMUSCULAR | Status: DC | PRN

## 2014-02-25 MED ORDER — ONDANSETRON HCL 4 MG/2ML IJ SOLN
INTRAMUSCULAR | Status: DC | PRN
  Administered 2014-02-25: 4 mg via INTRAVENOUS

## 2014-02-25 MED ORDER — PHENYLEPHRINE 8 MG IN D5W 100 ML (0.08MG/ML) PREMIX OPTIME
INJECTION | INTRAVENOUS | Status: DC | PRN
  Administered 2014-02-25: 60 ug/min via INTRAVENOUS

## 2014-02-25 MED ORDER — SODIUM CHLORIDE 0.9 % IJ SOLN: 3.0000 mL | INTRAMUSCULAR | Status: DC | PRN

## 2014-02-25 MED ORDER — OXYCODONE-ACETAMINOPHEN 5-325 MG PO TABS
2.0000 | ORAL_TABLET | ORAL | Status: DC | PRN
  Administered 2014-02-26 – 2014-02-27 (×4): 2 via ORAL
  Filled 2014-02-25 (×4): qty 2

## 2014-02-25 MED ORDER — LANOLIN HYDROUS EX OINT: 1.0000 "application " | TOPICAL_OINTMENT | CUTANEOUS | Status: DC | PRN

## 2014-02-25 MED ORDER — DIPHENHYDRAMINE HCL 25 MG PO CAPS: 25.0000 mg | ORAL_CAPSULE | Freq: Four times a day (QID) | ORAL | Status: DC | PRN

## 2014-02-25 MED ORDER — TETANUS-DIPHTH-ACELL PERTUSSIS 5-2.5-18.5 LF-MCG/0.5 IM SUSP: 0.5000 mL | Freq: Once | INTRAMUSCULAR | Status: DC

## 2014-02-25 MED ORDER — NALOXONE HCL 0.4 MG/ML IJ SOLN: 0.4000 mg | INTRAMUSCULAR | Status: DC | PRN

## 2014-02-25 MED ORDER — LABETALOL HCL 100 MG PO TABS
100.0000 mg | ORAL_TABLET | Freq: Two times a day (BID) | ORAL | Status: DC
  Administered 2014-02-25: 100 mg via ORAL
  Filled 2014-02-25 (×2): qty 1

## 2014-02-25 SURGICAL SUPPLY — 35 items
BENZOIN TINCTURE PRP APPL 2/3 (GAUZE/BANDAGES/DRESSINGS) ×2 IMPLANT
BLADE SURG 10 STRL SS (BLADE) ×4 IMPLANT
CATH ROBINSON RED A/P 16FR (CATHETERS) IMPLANT
CLAMP CORD UMBIL (MISCELLANEOUS) IMPLANT
CLIP FILSHIE TUBAL LIGA STRL (Clip) ×2 IMPLANT
CLOTH BEACON ORANGE TIMEOUT ST (SAFETY) ×2 IMPLANT
DRAPE LG THREE QUARTER DISP (DRAPES) IMPLANT
DRSG OPSITE POSTOP 4X10 (GAUZE/BANDAGES/DRESSINGS) ×2 IMPLANT
DRSG TELFA 3X8 NADH (GAUZE/BANDAGES/DRESSINGS) ×2 IMPLANT
DURAPREP 26ML APPLICATOR (WOUND CARE) ×2 IMPLANT
ELECT REM PT RETURN 9FT ADLT (ELECTROSURGICAL) ×2
ELECTRODE REM PT RTRN 9FT ADLT (ELECTROSURGICAL) ×1 IMPLANT
EXTRACTOR VACUUM M CUP 4 TUBE (SUCTIONS) IMPLANT
GLOVE BIOGEL PI IND STRL 7.5 (GLOVE) ×1 IMPLANT
GLOVE BIOGEL PI INDICATOR 7.5 (GLOVE) ×1
GLOVE ECLIPSE 7.5 STRL STRAW (GLOVE) ×2 IMPLANT
GOWN STRL REUS W/TWL LRG LVL3 (GOWN DISPOSABLE) ×6 IMPLANT
KIT ABG SYR 3ML LUER SLIP (SYRINGE) IMPLANT
NEEDLE HYPO 22GX1.5 SAFETY (NEEDLE) ×2 IMPLANT
NEEDLE HYPO 25X5/8 SAFETYGLIDE (NEEDLE) IMPLANT
NS IRRIG 1000ML POUR BTL (IV SOLUTION) ×2 IMPLANT
PACK C SECTION WH (CUSTOM PROCEDURE TRAY) ×2 IMPLANT
PAD OB MATERNITY 4.3X12.25 (PERSONAL CARE ITEMS) ×2 IMPLANT
RTRCTR C-SECT PINK 25CM LRG (MISCELLANEOUS) IMPLANT
RTRCTR C-SECT PINK 34CM XLRG (MISCELLANEOUS) ×2 IMPLANT
STRIP CLOSURE SKIN 1/2X4 (GAUZE/BANDAGES/DRESSINGS) ×2 IMPLANT
SUT MNCRL 0 VIOLET CTX 36 (SUTURE) IMPLANT
SUT MONOCRYL 0 CTX 36 (SUTURE)
SUT VIC AB 0 CTX 36 (SUTURE) ×3
SUT VIC AB 0 CTX36XBRD ANBCTRL (SUTURE) ×3 IMPLANT
SUT VIC AB 4-0 KS 27 (SUTURE) ×2 IMPLANT
SYR 30ML LL (SYRINGE) ×2 IMPLANT
TOWEL OR 17X24 6PK STRL BLUE (TOWEL DISPOSABLE) ×2 IMPLANT
TRAY FOLEY CATH 14FR (SET/KITS/TRAYS/PACK) ×2 IMPLANT
WATER STERILE IRR 1000ML POUR (IV SOLUTION) ×2 IMPLANT

## 2014-02-25 NOTE — Anesthesia Postprocedure Evaluation (Signed)
  Anesthesia Post-op Note  Patient: Caitlyn [name]  Procedure(s) Performed: Procedure(s): CESAREAN SECTION WITH BILATERAL TUBAL LIGATION (N/A)  Patient is awake, responsive, moving her legs, and has signs of resolution of her numbness. Pain and nausea are reasonably well controlled. Vital signs are stable and clinically acceptable. Oxygen saturation is clinically acceptable. There are no apparent anesthetic complications at this time. Patient is ready for discharge.

## 2014-02-25 NOTE — H&P (Signed)
Caitlyn [name] is a 33 y.o. female [phone] with IUP at [redacted]w[redacted]d presenting for repeat cesarean section. Pt states she has been having irregular contractions, no vaginal bleeding, intact membranes, with active fetal movement.  She was planning a TOLAC, but due to EFW of 10#15oz, planning repeat cesarean delivery.  Pregnancy complicated by Type 2 diabetes on diet control and hypertension on labetalol.  Prenatal Course Source of Care: [facility]  with onset of care at 14.3 weeks Pregnancy complications or risks: Patient Active Problem List   Diagnosis Date Noted  . Fetal macrosomia in pregnancy in third trimester 02/20/2014  . Hypertension in pregnancy, pre-existing, antepartum 10/14/2013  . Diabetes mellitus (Class B), antepartum 09/10/2013  . Hx of preeclampsia, prior pregnancy, currently pregnant 08/21/2013  . Previous cesarean section complicating pregnancy 08/21/2013  . Hypertension 08/21/2013  . Obesity complicating pregnancy in first trimester 08/21/2013  . 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  She plans to plans to bottle feed  Prenatal labs and studies: ABO, Rh: --/--/A POS (09/14 1555) Antibody: NEG (09/14 1555) Rubella:   RPR: NON REAC (09/14 1555)  HBsAg: NEGATIVE (02/24 1028)  HIV: NONREACTIVE (06/29 0840)  GBS: Negative (07/27 0000)  1 hr Glucola 188 Genetic screeningnot done. Anatomy US normal  Past Medical History:  Past Medical History  Diagnosis Date  . SVD (spontaneous vaginal delivery)     x 2  . Hypertension     PIH with pregnancy  . Sleep apnea     does not use CPAP - lost weight  . Diabetes mellitus without complication     GM - Diet controlled - no meds  . Anemia     Past Surgical History:  Past Surgical History  Procedure Laterality Date  . Cesarean section  03/13/2010  . Tooth extraction      Obstetrical History:  OB History   Grav Para Term Preterm Abortions TAB SAB Ect Mult Living   Gynecological History:  OB History   Grav Para Term Preterm Abortions TAB SAB Ect Mult Living   Social History:  History   Social History  . Marital Status: Single    Spouse Name: N/A    Number of Children: N/A  . Years of Education: N/A   Social History Main Topics  . Smoking status: Former Smoker -- 0.25 packs/day for 10 years    Types: Cigarettes    Quit date: 06/13/2008  . Smokeless tobacco: Never Used  . Alcohol Use: No  . Drug Use: No  . Sexual Activity: Yes    Birth Control/ Protection: None   Other Topics Concern  . None   Social History Narrative  . None    Family History: History reviewed. No pertinent family history.  Medications:  Prenatal vitamins,  Current Facility-Administered Medications  Medication Dose Route Frequency Provider Last Rate Last Dose  . cefoTEtan (CEFOTAN) 2 g in dextrose 5 % 50 mL IVPB  2 g Intravenous On Call to OR Ugonna A Anyanwu, MD      . lactated ringers infusion   Intravenous Continuous Tereso Newcomer, MD      . scopolamine (TRANSDERM-SCOP) 1 MG/3DAYS             Allergies:  Allergies  Allergen Reactions  . Codeine Anaphylaxis and Swelling  Puffy eyes    Review of Systems: - negative  Physical Exam: Blood pressure 155/89, pulse 73, temperature 98.2 F (36.8 C), temperature source Oral, resp. rate 18, last menstrual period 05/27/2013, SpO2 99.00%. GENERAL: Well-developed, well-nourished female in no acute distress.  LUNGS: Clear to auscultation bilaterally.  HEART: Regular rate and rhythm. ABDOMEN: Soft, nontender, nondistended, gravid. EFW 11 lbs EXTREMITIES: Nontender, no edema, 2+ distal pulses. FHT:  Baseline rate 138 bpm      Pertinent Labs/Studies:  none  Assessment : Caitlyn [name] is a 33 y.o. [id number] at [redacted]w[redacted]d being admitted for repeat cesarean section.  Recent US estimates FW at 11#.  Plan: - Repeat cesarean section.  The risks of cesarean section discussed with the  patient included but were not limited to: bleeding which may require transfusion or reoperation; infection which may require antibiotics; injury to bowel, bladder, ureters or other surrounding organs; injury to the fetus; need for additional procedures including hysterectomy in the event of a life-threatening hemorrhage; placental abnormalities wth subsequent pregnancies, incisional problems, thromboembolic phenomenon and other postoperative/anesthesia complications. The patient concurred with the proposed plan, giving informed written consent for the procedure.   Patient has been NPO since midnight.   she will remain NPO for procedure. Anesthesia and OR aware.  Preoperative prophylactic Ancef ordered on call to the OR.  To OR when ready.   Tory Septer JEHIEL 02/25/2014, 10:48 AM

## 2014-02-25 NOTE — Transfer of Care (Signed)
Immediate Anesthesia Transfer of Care Note  Patient: Caitlyn [name]  Procedure(s) Performed: Procedure(s): CESAREAN SECTION WITH BILATERAL TUBAL LIGATION (N/A)  Patient Location: PACU  Anesthesia Type:Spinal and Epidural  Level of Consciousness: awake, alert  and oriented  Airway & Oxygen Therapy: Patient Spontanous Breathing  Post-op Assessment: Report given to PACU RN and Post -op Vital signs reviewed and stable  Post vital signs: Reviewed and stable  Complications: No apparent anesthesia complications

## 2014-02-25 NOTE — Anesthesia Preprocedure Evaluation (Signed)
Anesthesia Evaluation  Patient identified by MRN, date of birth, ID band Patient awake    Reviewed: Allergy & Precautions, H&P , Patient's Chart, lab work & pertinent test results  Airway Mallampati: III TM Distance: >3 FB Neck ROM: full    Dental no notable dental hx.    Pulmonary sleep apnea , former smoker,  breath sounds clear to auscultation  Pulmonary exam normal       Cardiovascular Exercise Tolerance: Good hypertension, On Medications Rhythm:regular Rate:Normal     Neuro/Psych    GI/Hepatic   Endo/Other  diabetesMorbid obesity  Renal/GU      Musculoskeletal   Abdominal   Peds  Hematology   Anesthesia Other Findings   Reproductive/Obstetrics                           Anesthesia Physical Anesthesia Plan  ASA: III  Anesthesia Plan: Spinal, Epidural and Combined Spinal and Epidural   Post-op Pain Management:    Induction:   Airway Management Planned:   Additional Equipment:   Intra-op Plan:   Post-operative Plan:   Informed Consent: I have reviewed the patients History and Physical, chart, labs and discussed the procedure including the risks, benefits and alternatives for the proposed anesthesia with the patient or authorized representative who has indicated his/her understanding and acceptance.   Dental Advisory Given  Plan Discussed with: CRNA  Anesthesia Plan Comments: (Lab work confirmed with CRNA in room. Platelets okay. Discussed spinal anesthetic, and patient consents to the procedure:  included risk of possible headache,backache, failed block, allergic reaction, and nerve injury. This patient was asked if she had any questions or concerns before the procedure started. )        Anesthesia Quick Evaluation

## 2014-02-25 NOTE — Lactation Note (Signed)
This note was copied from the chart of Caitlyn Daiya Tamer. Lactation Consultation Note  Patient Name: Caitlyn [name] [id number] Date: 02/25/2014 Reason for consult: Other (Comment) (charting for exclusion)   Maternal Data Formula Feeding for Exclusion: Yes Reason for exclusion: Mother's choice to formula feed on admision  Feeding Feeding Type: Bottle Fed - Formula Nipple Type: Regular  LATCH Score/Interventions                      Lactation Tools Discussed/Used     Consult Status Consult Status: Complete    Lynda Rainwater 02/25/2014, 4:27 PM

## 2014-02-25 NOTE — Anesthesia Procedure Notes (Addendum)
Epidural   Spinal  Patient location during procedure: OR Preanesthetic Checklist Completed: patient identified, site marked, surgical consent, pre-op evaluation, timeout performed, IV checked, risks and benefits discussed and monitors and equipment checked Spinal Block Patient position: sitting Prep: DuraPrep Patient monitoring: cardiac monitor, continuous pulse ox, blood pressure and heart rate Approach: midline Location: L3-4 Injection technique: catheter Needle Needle type: Tuohy and Sprotte  Needle gauge: 24 G Needle length: 12.7 cm Needle insertion depth: 8 cm Catheter type: closed end flexible Catheter size: 19 g Catheter at skin depth: 15 cm Additional Notes Spinal Dosage in OR  Bupivicaine ml       1.3 PFMS04   mcg        100

## 2014-02-25 NOTE — Op Note (Signed)
Caitlyn [name] PROCEDURE DATE: 02/25/2014  PREOPERATIVE DIAGNOSIS: Intrauterine pregnancy at  [redacted]w[redacted]d weeks gestation; macrosomia and previous cesarean delivery, undesired fertility  POSTOPERATIVE DIAGNOSIS: The same  PROCEDURE: Repeat Low Transverse Cesarean Section with Bilateral tubal ligation  SURGEON:  Dr. Candelaria Celeste   INDICATIONS: Caitlyn [name] is a 33 y.o. [id number] at [redacted]w[redacted]d scheduled for cesarean section secondary to macrosomia and previous cesarean.  The risks of cesarean section discussed with the patient included but were not limited to: bleeding which may require transfusion or reoperation; infection which may require antibiotics; injury to bowel, bladder, ureters or other surrounding organs; injury to the fetus; need for additional procedures including hysterectomy in the event of a life-threatening hemorrhage; placental abnormalities wth subsequent pregnancies, incisional problems, thromboembolic phenomenon and other postoperative/anesthesia complications. The patient concurred with the proposed plan, giving informed written consent for the procedure.    FINDINGS:  Viable female infant in cephalic presentation.  Apgars 9 and 9, weight pending.  Clear amniotic fluid.  Intact placenta, three vessel cord.  Normal uterus, fallopian tubes and ovaries bilaterally.  ANESTHESIA:    Spinal INTRAVENOUS FLUIDS:1700 ml ESTIMATED BLOOD LOSS: 1300 ml URINE OUTPUT:  250 ml SPECIMENS: Placenta sent to L&D COMPLICATIONS: None immediate  PROCEDURE IN DETAIL:  The patient received intravenous antibiotics and had sequential compression devices applied to her lower extremities while in the preoperative area.  She was then taken to the operating room where a combined spinal and epidural anesthesia was administered and was found to be adequate. She was then placed in a dorsal supine position with a leftward tilt, and prepped and draped in a sterile manner.  A foley catheter was placed into her bladder and  attached to constant gravity, which drained clear fluid throughout.  After an adequate timeout was performed, a Pfannenstiel skin incision was made with scalpel and carried through to the underlying layer of fascia. The fascia was incised in the midline and this incision was extended bilaterally using the Mayo scissors. Kocher clamps were applied to the superior aspect of the fascial incision and the underlying rectus muscles were dissected off bluntly. A similar process was carried out on the inferior aspect of the facial incision. The rectus muscles were separated in the midline bluntly and the peritoneum was entered bluntly. An Alexis retractor was placed to aid in visualization of the uterus.  Attention was turned to the lower uterine segment where a transverse hysterotomy was made with a scalpel and extended bilaterally bluntly and with bandage scissors. The infant was successfully delivered, and cord was clamped and cut and infant was handed over to awaiting neonatology team. Uterine massage was then administered and the placenta delivered intact with three-vessel cord. The uterus was then cleared of clot and debris.  The hysterotomy was closed with 0 Vicryl in a running locked fashion, and an imbricating layer was also placed with a 0 Monoryl. Overall, excellent hemostasis was noted. The abdomen and the pelvis were cleared of all clot and debris and the Jon Gills was removed.  Attention was then turned to the fallopian tubes.  A Filshie clip was placed on both tubes, about 2 cm from the cornua, with care given to incorporate the underlying mesosalpinx on both sides, allowing for bilateral tubal sterilization.  Hemostasis was confirmed on all surfaces.  The fascia was then closed using 0 Vicryl in a running fashion.  The skin was closed with 4-0 vicryl. The patient tolerated the procedure well. Sponge, lap, instrument and needle counts were  correct x 2. She was taken to the recovery room in stable condition.     Candelaria Celeste JEHIEL DO 02/25/2014 2:42 PM

## 2014-02-26 ENCOUNTER — Encounter (HOSPITAL_COMMUNITY): Payer: Self-pay | Admitting: *Deleted

## 2014-02-26 LAB — CBC
HCT: 29.7 % — ABNORMAL LOW (ref 36.0–46.0)
Hemoglobin: 10 g/dL — ABNORMAL LOW (ref 12.0–15.0)
MCH: 27.5 pg (ref 26.0–34.0)
MCHC: 33.7 g/dL (ref 30.0–36.0)
MCV: 81.6 fL (ref 78.0–100.0)
Platelets: 168 10*3/uL (ref 150–400)
RBC: 3.64 MIL/uL — AB (ref 3.87–5.11)
RDW: 14.1 % (ref 11.5–15.5)
WBC: 9.1 10*3/uL (ref 4.0–10.5)

## 2014-02-26 LAB — BIRTH TISSUE RECOVERY COLLECTION (PLACENTA DONATION)

## 2014-02-26 MED ORDER — LISINOPRIL 10 MG PO TABS
10.0000 mg | ORAL_TABLET | Freq: Every day | ORAL | Status: DC
  Administered 2014-02-26 – 2014-02-27 (×2): 10 mg via ORAL
  Filled 2014-02-26 (×3): qty 1

## 2014-02-26 NOTE — Addendum Note (Signed)
Addendum created 02/26/14 0856 by Graciela Husbands, CRNA   Modules edited: Notes Section   Notes Section:  File: [id number]

## 2014-02-26 NOTE — Progress Notes (Signed)
Post Op Day 1 rLTCS/BTL Subjective: Foley still in. Ambulating minimally. No flatus/BM. Tolerating liquids  Objective: Blood pressure 110/55, pulse 84, temperature 98.7 F (37.1 C), temperature source Oral, resp. rate 16, weight 129.275 kg (285 lb), last menstrual period 05/27/2013, SpO2 96.00%, unknown if currently breastfeeding.  Physical Exam:  General: alert, cooperative and no distress Lochia: appropriate Uterine Fundus: firm Incision: healing well, serosanguinous drainage on the dressing DVT Evaluation: No evidence of DVT seen on physical exam. Negative Homan's sign. No cords or calf tenderness.   Recent Labs  02/24/14 1555 02/26/14 0612  HGB 11.1* 10.0*  HCT 32.9* 29.7*    Assessment/Plan: Plan for discharge tomorrow Bottle feeding, outpatient circ, continue labetalol  BID for cHTN   LOS: 1 day   Caitlyn [name] 02/26/2014, 8:11 AM   I have seen the patient with the resident/student and agree with the above.  Tawnya Crook

## 2014-02-26 NOTE — Progress Notes (Signed)
Ur chart review completed.  

## 2014-02-26 NOTE — Anesthesia Postprocedure Evaluation (Signed)
Anesthesia Post Note  Patient: Caitlyn [name]  Procedure(s) Performed: Procedure(s) (LRB): CESAREAN SECTION WITH BILATERAL TUBAL LIGATION (N/A)  Anesthesia type: Epidural/pinal  Patient location: Mother/Baby  Post pain: Pain level controlled  Post assessment: Post-op Vital signs reviewed  Last Vitals:  Filed Vitals:   02/26/14 0500  BP: 110/55  Pulse: 84  Temp: 37.1 C  Resp: 16    Post vital signs: Reviewed  Level of consciousness:alert  Complications: No apparent anesthesia complications

## 2014-02-27 MED ORDER — LISINOPRIL 10 MG PO TABS: 10.0000 mg | ORAL_TABLET | Freq: Every day | ORAL | Status: DC

## 2014-02-27 MED ORDER — OXYCODONE-ACETAMINOPHEN 5-325 MG PO TABS: 1.0000 | ORAL_TABLET | ORAL | Status: DC | PRN

## 2014-02-27 NOTE — Discharge Summary (Signed)
Obstetric Discharge Summary Reason for Admission: cesarean section Prenatal Procedures: ultrasound Intrapartum Procedures: cesarean: low cervical, transverse Postpartum Procedures: P.P. tubal ligation Complications-Operative and Postpartum: none Hemoglobin  Date Value Ref Range Status  02/26/2014 10.0* 12.0 - 15.0 g/dL Final  1/61/0960 45.4   Final     HCT  Date Value Ref Range Status  02/26/2014 29.7* 36.0 - 46.0 % Final  08/06/2013 37   Final   Hospital Course:  Caitlyn [name] is a 33 y.o. [id number] at [redacted]w[redacted]d scheduled for cesarean section secondary to macrosomia and previous cesarean.  Viable female infant in cephalic presentation. Apgars 9 and 9. Clear amniotic fluid. Intact placenta, three vessel cord. Normal uterus, fallopian tubes and ovaries bilaterally.  ANESTHESIA: Spinal  INTRAVENOUS FLUIDS:1700 ml  ESTIMATED BLOOD LOSS: 1300 ml  URINE OUTPUT: 250 ml  SPECIMENS: Placenta sent to L&D  COMPLICATIONS: None immediate  Blood sugars stable during hospitalization without intervention. She was transitioned from labetalol to lisinopril as she is not breast feeding and her history of DM. Blood pressures remained well controlled.  Physical Exam:  General: alert, cooperative and no distress Lochia: appropriate Uterine Fundus: firm Incision: healing well DVT Evaluation: Calf/Ankle edema is present.  Discharge Diagnoses: Term Pregnancy-delivered  Discharge Information: Date: 02/27/2014 Activity: unrestricted and pelvic rest Diet: routine Medications: Colace, Percocet and lisinopril Condition: stable Instructions: refer to practice specific booklet Discharge to: home Follow-up Information   Follow up with [facility] In 1 week. (with RN for blood pressure check)    Contact information:   [address] [place] [address] [phone]      Follow up with [facility] In 5 weeks. (for post partum visit)    Contact information:   [address] [place] [address] [phone]      Newborn Data: Live born female  Birth Weight: 9 lb 10.7 oz (4386 g) APGAR: 9, 9  Home with mother.  Caitlyn [name] A 02/27/2014, 6:33 AM  I have seen and examined this patient and I agree with the above. Caitlyn [name] CNM 2:10 PM 02/28/2014

## 2014-02-28 LAB — TYPE AND SCREEN
ABO/RH(D): A POS
Antibody Screen: NEGATIVE
Unit division: 0
Unit division: 0

## 2014-03-02 NOTE — Discharge Summary (Signed)
`````  Attestation of Attending Supervision of Advanced Practitioner: Evaluation and management procedures were performed by the PA/NP/CNM/OB Fellow under my supervision/collaboration. Chart reviewed and agree with management and plan.  Lenus Trauger V 03/02/2014 8:51 PM

## 2014-03-06 ENCOUNTER — Telehealth: Payer: Self-pay

## 2014-03-06 NOTE — Telephone Encounter (Signed)
Received call from Benita Stabile, nurse with [facility] who stated she saw patient today and wanted to report her Hemoglobin of 9.4. Also stated patient is concerned about some welling in her legs and feet. Per chart review patient was to follow up in clinic for BP check 1 week after 02/26/14-- not scheduled. Attempted to call patient to have appointment scheduled, no answer, left message stating we are calling to see how you are doing as well as schedule an appointment, please call clinic.

## 2014-03-10 NOTE — Telephone Encounter (Signed)
Attempted to call patient. No answer. Left message stating we are calling to schedule an appointment, please call clinic at earliest convenience.

## 2014-03-11 NOTE — Telephone Encounter (Signed)
Attempted to call patient regarding BP check appointment. No answer. Left message stating we are calling to schedule an appointment, please call clinic. Will send letter. Scheduled BP check appointment for 03/17/14 at 0930 to allow time for patient to receive letter. If patient calls prior to this, appointment can be made earlier and at her convenience.

## 2014-03-17 ENCOUNTER — Telehealth: Payer: Self-pay | Admitting: Family Medicine

## 2014-03-17 NOTE — Telephone Encounter (Signed)
Called patient about her missed appointment. Left message to still come in and get BP check. Visit is quick.

## 2014-04-04 ENCOUNTER — Telehealth: Payer: Self-pay | Admitting: *Deleted

## 2014-04-04 ENCOUNTER — Encounter: Payer: Self-pay | Admitting: *Deleted

## 2014-04-04 ENCOUNTER — Ambulatory Visit: Payer: Medicaid Other | Admitting: Family Medicine

## 2014-04-04 ENCOUNTER — Encounter: Payer: Self-pay | Admitting: Family Medicine

## 2014-04-04 NOTE — Telephone Encounter (Signed)
Caitlyn [name] missed her postpartum appointment. Called her and left  A message I was calling regarding her appointment and to please call clinic to reschedule. Also will send letter.

## 2014-04-08 ENCOUNTER — Encounter: Payer: Self-pay | Admitting: General Practice

## 2014-04-14 ENCOUNTER — Encounter (HOSPITAL_COMMUNITY): Payer: Self-pay | Admitting: *Deleted

## 2014-04-14 ENCOUNTER — Encounter: Payer: Self-pay | Admitting: *Deleted

## 2014-05-02 ENCOUNTER — Encounter: Payer: Self-pay | Admitting: Obstetrics & Gynecology

## 2014-05-02 ENCOUNTER — Telehealth: Payer: Self-pay | Admitting: *Deleted

## 2014-05-02 ENCOUNTER — Encounter: Payer: Self-pay | Admitting: *Deleted

## 2014-05-02 ENCOUNTER — Ambulatory Visit: Payer: Medicaid Other | Admitting: Obstetrics & Gynecology

## 2014-05-02 NOTE — Telephone Encounter (Signed)
Caitlyn [name] missed her scheduled postpartum appointment. Called and left message notifying her she missed a scheduled appointment- please call back to reschedule. Will sent letter.

## 2014-05-14 ENCOUNTER — Encounter: Payer: Self-pay | Admitting: Family Medicine

## 2014-06-23 ENCOUNTER — Ambulatory Visit: Payer: Medicaid Other | Admitting: Obstetrics & Gynecology

## 2014-09-29 ENCOUNTER — Encounter (HOSPITAL_COMMUNITY): Payer: Self-pay | Admitting: *Deleted

## 2014-09-29 ENCOUNTER — Inpatient Hospital Stay (HOSPITAL_COMMUNITY)
Admission: AD | Admit: 2014-09-29 | Discharge: 2014-09-30 | Disposition: A | Discharge status: Home / Self Care | Payer: Medicaid Other | Source: Ambulatory Visit | Attending: Emergency Medicine | Admitting: Emergency Medicine

## 2014-09-29 DIAGNOSIS — Z87891 Personal history of nicotine dependence: Secondary | ICD-10-CM | POA: Insufficient documentation

## 2014-09-29 DIAGNOSIS — R358 Other polyuria: Secondary | ICD-10-CM

## 2014-09-29 DIAGNOSIS — E1165 Type 2 diabetes mellitus with hyperglycemia: Secondary | ICD-10-CM | POA: Diagnosis not present

## 2014-09-29 DIAGNOSIS — R631 Polydipsia: Secondary | ICD-10-CM

## 2014-09-29 DIAGNOSIS — G4730 Sleep apnea, unspecified: Secondary | ICD-10-CM | POA: Insufficient documentation

## 2014-09-29 DIAGNOSIS — R3589 Other polyuria: Secondary | ICD-10-CM

## 2014-09-29 DIAGNOSIS — N938 Other specified abnormal uterine and vaginal bleeding: Secondary | ICD-10-CM

## 2014-09-29 DIAGNOSIS — I10 Essential (primary) hypertension: Secondary | ICD-10-CM | POA: Diagnosis not present

## 2014-09-29 LAB — CBG MONITORING, ED
GLUCOSE-CAPILLARY: 220 mg/dL — AB (ref 70–99)
GLUCOSE-CAPILLARY: 305 mg/dL — AB (ref 70–99)
GLUCOSE-CAPILLARY: 411 mg/dL — AB (ref 70–99)
GLUCOSE-CAPILLARY: 453 mg/dL — AB (ref 70–99)
Glucose-Capillary: 327 mg/dL — ABNORMAL HIGH (ref 70–99)
Glucose-Capillary: 262 mg/dL — ABNORMAL HIGH (ref 70–99)

## 2014-09-29 LAB — URINALYSIS, ROUTINE W REFLEX MICROSCOPIC
Bilirubin Urine: NEGATIVE
Glucose, UA: >1000 mg/dL — AB
Ketones, ur: NEGATIVE mg/dL
LEUKOCYTES UA: NEGATIVE
Nitrite: NEGATIVE
PROTEIN: NEGATIVE mg/dL
SPECIFIC GRAVITY, URINE: 1.01 (ref 1.005–1.030)
UROBILINOGEN UA: 0.2 mg/dL (ref 0.0–1.0)
pH: 5 (ref 5.0–8.0)

## 2014-09-29 LAB — COMPREHENSIVE METABOLIC PANEL
ALT: 26 U/L (ref 0–35)
ANION GAP: 16 — AB (ref 5–15)
AST: 21 U/L (ref 0–37)
Albumin: 5.1 g/dL (ref 3.5–5.2)
Alkaline Phosphatase: 79 U/L (ref 39–117)
BUN: 23 mg/dL (ref 6–23)
CALCIUM: 10.7 mg/dL — AB (ref 8.4–10.5)
CHLORIDE: 89 mmol/L — AB (ref 96–112)
CO2: 25 mmol/L (ref 19–32)
Creatinine, Ser: 1.01 mg/dL (ref 0.50–1.10)
GFR calc Af Amer: 84 mL/min — ABNORMAL LOW (ref >90)
GFR, EST NON AFRICAN AMERICAN: 73 mL/min — AB (ref >90)
Glucose, Bld: 633 mg/dL (ref 70–99)
Potassium: 4 mmol/L (ref 3.5–5.1)
Sodium: 130 mmol/L — ABNORMAL LOW (ref 135–145)
Total Bilirubin: 0.6 mg/dL (ref 0.3–1.2)
Total Protein: 9.7 g/dL — ABNORMAL HIGH (ref 6.0–8.3)

## 2014-09-29 LAB — CBC
HCT: 39.3 % (ref 36.0–46.0)
HEMOGLOBIN: 13.8 g/dL (ref 12.0–15.0)
MCH: 27.8 pg (ref 26.0–34.0)
MCHC: 35.1 g/dL (ref 30.0–36.0)
MCV: 79.2 fL (ref 78.0–100.0)
Platelets: 263 10*3/uL (ref 150–400)
RBC: 4.96 MIL/uL (ref 3.87–5.11)
RDW: 12.8 % (ref 11.5–15.5)
WBC: 9.9 10*3/uL (ref 4.0–10.5)

## 2014-09-29 LAB — GLUCOSE, CAPILLARY: Glucose-Capillary: 564 mg/dL (ref 70–99)

## 2014-09-29 LAB — URINE MICROSCOPIC-ADD ON

## 2014-09-29 LAB — MAGNESIUM: MAGNESIUM: 2.3 mg/dL (ref 1.5–2.5)

## 2014-09-29 LAB — WET PREP, GENITAL
Clue Cells Wet Prep HPF POC: NONE SEEN
TRICH WET PREP: NONE SEEN
WBC, Wet Prep HPF POC: NONE SEEN
YEAST WET PREP: NONE SEEN

## 2014-09-29 LAB — POCT PREGNANCY, URINE: PREG TEST UR: NEGATIVE

## 2014-09-29 MED ORDER — DEXTROSE-NACL 5-0.45 % IV SOLN
INTRAVENOUS | Status: DC
  Administered 2014-09-30: via INTRAVENOUS

## 2014-09-29 MED ORDER — SODIUM CHLORIDE 0.9 % IV SOLN
1000.0000 mL | Freq: Once | INTRAVENOUS | Status: AC
  Administered 2014-09-29: 1000 mL via INTRAVENOUS

## 2014-09-29 MED ORDER — SODIUM CHLORIDE 0.9 % IV SOLN
1000.0000 mL | INTRAVENOUS | Status: DC
  Administered 2014-09-29: 1000 mL via INTRAVENOUS

## 2014-09-29 MED ORDER — POTASSIUM CHLORIDE 10 MEQ/100ML IV SOLN
10.0000 meq | Freq: Once | INTRAVENOUS | Status: AC
Start: 2014-09-29 — End: 2014-09-29
  Administered 2014-09-29: 10 meq via INTRAVENOUS
  Filled 2014-09-29: qty 100

## 2014-09-29 MED ORDER — INSULIN REGULAR HUMAN 100 UNIT/ML IJ SOLN
INTRAMUSCULAR | Status: DC
  Administered 2014-09-29: 2.7 [IU]/h via INTRAVENOUS
  Filled 2014-09-29: qty 2.5

## 2014-09-29 NOTE — ED Provider Notes (Signed)
CSN: [id number]     Arrival date & time 09/29/14  1535 History   First MD Initiated Contact with Patient 09/29/14 1805     Chief Complaint  Patient presents with  . Vaginal Bleeding  . Hyperglycemia     (Consider location/radiation/quality/duration/timing/severity/associated sxs/prior Treatment) HPI Comments: Caitlyn [name] is a 34 y.o. female with a PMHx of DM2 (diet controlled), anemia, and PIH with pregnancy, who presents to the ED from [facility] hospital with complaints of hyperglycemia, polyuria, polydipsia x2wks and dysfunctional uterine bleeding 4 months. She was seen at [facility] today, where they noted that her CBG was 564 therefore they transferred her for further evaluation and management. She states she noticed she has been thirstier than normal and had increased urinary frequency with "foamy" urine which she remembers having when she had gestational diabetes. She states she was previously managed with diet and exercise, but since the birth of her child 6 months ago she has gained weight and has not had follow-up for diabetes. Associated symptoms include mild nausea and lightheadedness with standing which started today. Additionally she states that for the last 4 months she has had heavier than normal menses and they've been coming more frequently than normal. She states that she has had approximately 2 menstrual cycles per month, and that she goes through 10-15 tampons per day. She describes the vaginal bleeding as red, similar to a normal menses, with no passage of clots or tissues. She recalls that she had similar vaginal bleeding last year and was treated with a 5 day course of OCPs but she cannot recall the name of the medication. She denies any recent fevers, chills, chest pain, shortness breath, abdominal pain, vomiting, diarrhea, constipation, melena, hematochezia, dysuria, hematuria, rashes, URI symptoms, headache, vision changes, numbness, tingling, or weakness.  Patient is a  34 y.o. female presenting with vaginal bleeding and hyperglycemia. The history is provided by the patient. No language interpreter was used.  Vaginal Bleeding Quality:  Heavier than menses Severity:  Moderate Onset quality:  Gradual Duration:  4 months Timing:  Intermittent Progression:  Unchanged Chronicity:  Recurrent Menstrual history:  Regular Number of tampons used:  10-15/day Possible pregnancy: no   Context: spontaneously   Relieved by:  None tried Worsened by:  Nothing tried Ineffective treatments:  None tried Associated symptoms: no abdominal pain, no back pain, no dizziness, no dysuria, no fever, no nausea and no vaginal discharge   Risk factors: no bleeding disorder   Hyperglycemia Blood sugar level PTA:  564 Severity:  Severe Onset quality:  Unable to specify Timing:  Unable to specify Progression:  Unable to specify Chronicity:  Recurrent Diabetes status:  Controlled with diet Current diabetic therapy:  None Context comment:  Weight gain Relieved by:  None tried Ineffective treatments:  None tried Associated symptoms: increased thirst, polyuria and weight change (increased)   Associated symptoms: no abdominal pain, no blurred vision, no chest pain, no confusion, no dizziness, no dysuria, no fever, no nausea, no shortness of breath, no syncope, no vomiting and no weakness   Risk factors: obesity   Risk factors: no hx of DKA and no pregnancy     Past Medical History  Diagnosis Date  . SVD (spontaneous vaginal delivery)     x 2  . Hypertension     PIH with pregnancy  . Sleep apnea     does not use CPAP - lost weight  . Diabetes mellitus without complication     GM - Diet controlled -  no meds  . Anemia    Past Surgical History  Procedure Laterality Date  . Cesarean section  03/13/2010  . Tooth extraction    . Cesarean section with bilateral tubal ligation N/A 02/25/2014    Procedure: CESAREAN SECTION WITH BILATERAL TUBAL LIGATION;  Surgeon: Levie Heritage,  DO;  Location: [facility];  Service: Obstetrics;  Laterality: N/A;   No family history on file. History  Substance Use Topics  . Smoking status: Former Smoker -- 0.25 packs/day for 10 years    Types: Cigarettes    Quit date: 06/13/2008  . Smokeless tobacco: Never Used  . Alcohol Use: No   OB History    Gravida Para Term Preterm AB TAB SAB Ectopic Multiple Living   5 4 4  1  1   4      Review of Systems  Constitutional: Negative for fever and chills.  HENT: Negative for congestion, rhinorrhea and sinus pressure.   Eyes: Negative for blurred vision and visual disturbance.  Respiratory: Negative for cough and shortness of breath.   Cardiovascular: Negative for chest pain and syncope.  Gastrointestinal: Negative for nausea, vomiting, abdominal pain, diarrhea, constipation and blood in stool.  Endocrine: Positive for polydipsia and polyuria.  Genitourinary: Positive for vaginal bleeding and menstrual problem. Negative for dysuria, hematuria, flank pain, vaginal discharge, vaginal pain and pelvic pain.  Musculoskeletal: Negative for myalgias, back pain and arthralgias.  Skin: Negative for color change and rash.  Allergic/Immunologic: Positive for immunocompromised state (diabetic).  Neurological: Positive for light-headedness (with standing). Negative for dizziness, syncope, weakness, numbness and headaches.  Psychiatric/Behavioral: Negative for confusion.   10 Systems reviewed and are negative for acute change except as noted in the HPI.    Allergies  Codeine  Home Medications   Prior to Admission medications   Medication Sig Start Date End Date Taking? Authorizing Provider  lisinopril (PRINIVIL,ZESTRIL) 10 MG tablet Take 1 tablet (10 mg total) by mouth daily. Patient not taking: Reported on 09/29/2014 02/27/14   [name], MD  oxyCODONE-acetaminophen (PERCOCET/ROXICET) 5-325 MG per tablet Take 1 tablet by mouth every 4 (four) hours as needed (for pain scale less than 7). Patient not  taking: Reported on 09/29/2014 02/27/14   [name], MD   BP 159/101 mmHg  Pulse 97  Temp(Src) 98.8 F (37.1 C) (Oral)  Resp 18  LMP 09/02/2014 (Exact Date)  Breastfeeding? No Physical Exam  Constitutional: She is oriented to person, place, and time. She appears well-developed and well-nourished.  Non-toxic appearance. No distress.  Afebrile, nontoxic, NAD, mildly tachycardic  HENT:  Head: Normocephalic and atraumatic.  Mouth/Throat: Oropharynx is clear and moist. Mucous membranes are dry.  Mildly dry mucous membranes  Eyes: Conjunctivae and EOM are normal. Right eye exhibits no discharge. Left eye exhibits no discharge.  Neck: Normal range of motion. Neck supple.  Cardiovascular: Regular rhythm, normal heart sounds and intact distal pulses.  Tachycardia present.  Exam reveals no gallop and no friction rub.   No murmur heard. Tachycardic, reg rhythm, nl s1/s2, no m/r/g  Pulmonary/Chest: Effort normal and breath sounds normal. No respiratory distress. She has no decreased breath sounds. She has no wheezes. She has no rhonchi. She has no rales.  Abdominal: Soft. Normal appearance and bowel sounds are normal. She exhibits no distension. There is no tenderness. There is no rigidity, no rebound, no guarding, no CVA tenderness, no tenderness at McBurney's point and negative Murphy's sign.  Genitourinary: Uterus normal. Pelvic exam was performed with patient supine. There  is no rash, tenderness or lesion on the right labia. There is no rash, tenderness or lesion on the left labia. Cervix exhibits no motion tenderness, no discharge and no friability. Right adnexum displays no mass, no tenderness and no fullness. Left adnexum displays no mass, no tenderness and no fullness. There is bleeding in the vagina. No foreign body around the vagina. No vaginal discharge found.  Chaperone present for exam. No rashes, lesions, or tenderness to external genitalia. No erythema, injury, or tenderness to vaginal  mucosa. No vaginal discharge. Mild darker red vaginal bleeding within vaginal vault without clots of passed tissue, coming from cervical os. No  adnexal masses, tenderness, or fullness. No CMT, cervical friability, or discharge from cervical os. Uterus non-deviated, mobile, nonTTP, and without enlargement.    Musculoskeletal: Normal range of motion.  MAE x4 Strength and sensation grossly intact Distal pulses intact  Neurological: She is alert and oriented to person, place, and time. She has normal strength. No sensory deficit.  Skin: Skin is warm, dry and intact. No rash noted.  Psychiatric: She has a normal mood and affect.  Nursing note and vitals reviewed.   ED Course  Procedures (including critical care time) Labs Review Labs Reviewed  URINALYSIS, ROUTINE W REFLEX MICROSCOPIC - Abnormal; Notable for the following:    Color, Urine STRAW (*)    Glucose, UA >1000 (*)    Hgb urine dipstick MODERATE (*)    All other components within normal limits  GLUCOSE, CAPILLARY - Abnormal; Notable for the following:    Glucose-Capillary 564 (*)    All other components within normal limits  COMPREHENSIVE METABOLIC PANEL - Abnormal; Notable for the following:    Sodium 130 (*)    Chloride 89 (*)    Glucose, Bld 633 (*)    Calcium 10.7 (*)    Total Protein 9.7 (*)    GFR calc non Af Amer 73 (*)    GFR calc Af Amer 84 (*)    Anion gap 16 (*)    All other components within normal limits  CBG MONITORING, ED - Abnormal; Notable for the following:    Glucose-Capillary 453 (*)    All other components within normal limits  CBG MONITORING, ED - Abnormal; Notable for the following:    Glucose-Capillary 411 (*)    All other components within normal limits  CBG MONITORING, ED - Abnormal; Notable for the following:    Glucose-Capillary 327 (*)    All other components within normal limits  CBG MONITORING, ED - Abnormal; Notable for the following:    Glucose-Capillary 305 (*)    All other components  within normal limits  I-STAT CHEM 8, ED - Abnormal; Notable for the following:    Glucose, Bld 214 (*)    All other components within normal limits  CBG MONITORING, ED - Abnormal; Notable for the following:    Glucose-Capillary 262 (*)    All other components within normal limits  CBG MONITORING, ED - Abnormal; Notable for the following:    Glucose-Capillary 220 (*)    All other components within normal limits  WET PREP, GENITAL  CBC  URINE MICROSCOPIC-ADD ON  MAGNESIUM  HEMOGLOBIN A1C  POCT PREGNANCY, URINE  GC/CHLAMYDIA PROBE AMP ([place])    Imaging Review No results found.   EKG Interpretation None      MDM   Final diagnoses:  Type 2 diabetes mellitus with hyperglycemia  Dysfunctional uterine bleeding  Polyuria  Polydipsia    33 y.o.  female here with hyperglycemia, polyuria/polydipsia, [facility] Also has had DUB x4 months which she's had once before, [facility] prior to transfer. CBG here 453, CMP showing anion gap at 16 with normal bicarb, sodium falsely low at 130, potassium 4.0. Upreg neg. CBC unremarkable. U/A without ketones or signs of infection. Will give fluids and start glucostabilizer, then repeat BMP. Doesn't appear to be DKA at this time. Will give potassium with glucostabilizer. Will proceed with pelvic exam as well.   6:53 PM Vaginal exam showing dark blood in vaginal vault coming from cervical os, no clots, no passed tissue. No tenderness on exam. Likely just DUB, has had OCP 5 day course in the past to help with her DUB, will proceed with provera 10mg  QD x10d at discharge.   10:24 PM CBG improving down to 305 at last check, will get repeat chem 8 now. Mg level WNL. Wet prep unremarkable.   12:59 AM Multiple delays in getting chem 8, but results now back and show that all lytes have normalized, no longer has anion gap, bicarb normal. Tachycardia improved after fluids given. Glucose down to 214. Pt  feels well, ready for discharge. Will send home with provera and metformin. Will have her f/up with PCP and OBGYN for recheck of DUB and DM2 in 1wk. Discussed diet and exercise for DM2 management. I explained the diagnosis and have given explicit precautions to return to the ER including for any other new or worsening symptoms. The patient understands and accepts the medical plan as it's been dictated and I have answered their questions. Discharge instructions concerning home care and prescriptions have been given. The patient is STABLE and is discharged to home in good condition.  BP 103/57 mmHg  Pulse 84  Temp(Src) 99 F (37.2 C) (Oral)  Resp 20  SpO2 99%  LMP 09/02/2014 (Exact Date)  Breastfeeding? No  Meds ordered this encounter  Medications  . dextrose 5 %-0.45 % sodium chloride infusion    Sig:   . insulin regular (NOVOLIN R,HUMULIN R) 250 Units in sodium chloride 0.9 % 250 mL (1 Units/mL) infusion    Sig:     Order Specific Question:  GlucoStabilizer low target:    Answer:  140    Order Specific Question:  GlucoStabilizer high target:    Answer:  180    Order Specific Question:  Forensic scientistGlucoStabilizer multiplier:    Answer:  0.01  . 0.9 %  sodium chloride infusion    Sig:    Followed by  . 0.9 %  sodium chloride infusion    Sig:   . potassium chloride 10 mEq in 100 mL IVPB    Sig:   . medroxyPROGESTERone (PROVERA) 5 MG tablet    Sig: Take 2 tablets (10 mg total) by mouth daily. X 10 days    Dispense:  20 tablet    Refill:  0    Order Specific Question:  Supervising Provider    Answer:  MILLER, BRIAN [[id number]]  . metFORMIN (GLUCOPHAGE) 500 MG tablet    Sig: Take 1 tablet (500 mg total) by mouth 2 (two) times daily with a meal.    Dispense:  60 tablet    Refill:  0    Order Specific Question:  Supervising Provider    Answer:  Eber HongMILLER, BRIAN [[id number]]     Carline Dura Camprubi-Soms, PA-C 09/30/14 0101  Eber HongBrian Miller, MD 10/01/14 1131

## 2014-09-29 NOTE — MAU Note (Signed)
Patient has been having off and on menstrual periods for the past 4 months. Spotting in between "periods" but having 2 per month. No pain. No vaginal discharge. Has had a tubal ligation. Says she voids every 10 to 20 minutes for the past week, an average amount each time. Says feels like her blood sugar is high. Felt nauseous, vision blurred and glossy. C/O excessive sweating.

## 2014-09-29 NOTE — MAU Provider Note (Signed)
  History   34 yo [id number] in with c/o for the past 4 mos when her period starts its last for 2+ wks. She is now starting to feel bad and also c/o urinary frequency. And increased thurst. Hx of GDM and BTL  CSN: [id number]  Arrival date and time: 09/29/14 1535   None     Chief Complaint  Patient presents with  . Vaginal Bleeding  . Hyperglycemia   HPI  OB History    Gravida Para Term Preterm AB TAB SAB Ectopic Multiple Living   5 4 4  1  1   4       Past Medical History  Diagnosis Date  . SVD (spontaneous vaginal delivery)     x 2  . Hypertension     PIH with pregnancy  . Sleep apnea     does not use CPAP - lost weight  . Diabetes mellitus without complication     GM - Diet controlled - no meds  . Anemia     Past Surgical History  Procedure Laterality Date  . Cesarean section  03/13/2010  . Tooth extraction    . Cesarean section with bilateral tubal ligation N/A 02/25/2014    Procedure: CESAREAN SECTION WITH BILATERAL TUBAL LIGATION;  Surgeon: Levie HeritageJacob J Stinson, DO;  Location: [facility];  Service: Obstetrics;  Laterality: N/A;    No family history on file.  History  Substance Use Topics  . Smoking status: Former Smoker -- 0.25 packs/day for 10 years    Types: Cigarettes    Quit date: 06/13/2008  . Smokeless tobacco: Never Used  . Alcohol Use: No    Allergies:  Allergies  Allergen Reactions  . Codeine Anaphylaxis and Swelling    Puffy eyes, pt can take Percocet    Prescriptions prior to admission  Medication Sig Dispense Refill Last Dose  . lisinopril (PRINIVIL,ZESTRIL) 10 MG tablet Take 1 tablet (10 mg total) by mouth daily. 30 tablet 3 02/25/2014  . oxyCODONE-acetaminophen (PERCOCET/ROXICET) 5-325 MG per tablet Take 1 tablet by mouth every 4 (four) hours as needed (for pain scale less than 7). 30 tablet 0 03/27/2014    Review of Systems  Constitutional: Positive for malaise/fatigue.  HENT: Negative.   Eyes: Negative.   Respiratory: Negative.    Cardiovascular: Negative.   Gastrointestinal: Negative.   Genitourinary: Negative.   Musculoskeletal: Negative.   Neurological: Negative.   Endo/Heme/Allergies: Negative.   Psychiatric/Behavioral: Negative.    Physical Exam   Last menstrual period 09/02/2014, not currently breastfeeding.  Physical Exam  Constitutional: She is oriented to person, place, and time. She appears well-developed and well-nourished.  HENT:  Head: Normocephalic.  Eyes: Pupils are equal, round, and reactive to light.  Neck: Normal range of motion.  Cardiovascular: Normal rate, regular rhythm, normal heart sounds and intact distal pulses.   Respiratory: Effort normal and breath sounds normal.  GI: Soft. Bowel sounds are normal.  Genitourinary: Vagina normal and uterus normal.  Musculoskeletal: Normal range of motion.  Neurological: She is alert and oriented to person, place, and time. She has normal reflexes.  Skin: Skin is warm and dry.  Psychiatric: She has a normal mood and affect. Her behavior is normal. Judgment and thought content normal.    MAU Course  Procedures  MDM Hyperglycemia, CBG 564, transfer to [facility]  Assessment and Plan  Diabetes, DUB, morbid obesity, transfer to [facility], accepting physician Dr. Yisroel RammingGlick  Caitlyn [name] DARLENE 09/29/2014, 4:04 PM

## 2014-09-29 NOTE — Progress Notes (Signed)
CRITICAL VALUE ALERT  Critical value received:  Blood glucose 633  Date of notification: 09/29/2014  Time of notification:  1807   Critical value read back: Yes  Nurse who received alert:  Bobbe MedicoSusan Dason Mosley, RNC  MD notified: Marlynn Perking. Lawson, CNM

## 2014-09-29 NOTE — ED Notes (Signed)
MD at bedside. 

## 2014-09-29 NOTE — ED Notes (Signed)
[facility] Polydipsia, polyuria, excessive menses (more than 2 weeks). A&O X4. Glucose is 564.

## 2014-09-29 NOTE — ED Notes (Signed)
[facility] onset of hyperglycemia. Pt states she had gestational diabetes that resolved upon delivery of her child who is now 6 months. Pt states she is also having severe menstrual bleeding for 3 months intermittently. Pt states she has been extremely thirsty and has had polyuria.

## 2014-09-29 NOTE — ED Provider Notes (Signed)
The patient is an obese 34 year old female with a history of gestational diabetes 6 months ago, states that this resolved after she delivered her child and has not been on any medications since that time however over the last several days has had increasing thirst, increasing urinary frequency and today became acutely dizzy and lightheaded while walking. On exam the patient has a soft nontender abdomen, clear heart and lung sounds, mental status is normal, mucous membranes are slightly dehydrated. Labs confirm that she is severely hyperglycemic, borderline diabetic ketoacidosis, she does not appear ill or toxic, fluids, insulin drip, anticipate discharge after repeat basic metabolic panel, she will need some potassium replaced. Anticipate discharge on oral medications. This was explained to the patient who expresses her understanding.  Medical screening examination/treatment/procedure(s) were conducted as a shared visit with non-physician practitioner(s) and myself.  I personally evaluated the patient during the encounter.  Clinical Impression:   Final diagnoses:  Type 2 diabetes mellitus with hyperglycemia  Dysfunctional uterine bleeding  Polyuria  Polydipsia         Eber HongBrian Promise Weldin, MD 10/01/14 1131

## 2014-09-29 NOTE — MAU Note (Signed)
[facility] called with patient report.

## 2014-09-30 LAB — HEMOGLOBIN A1C
Hgb A1c MFr Bld: 10.8 % — ABNORMAL HIGH (ref 4.8–5.6)
Mean Plasma Glucose: 263 mg/dL

## 2014-09-30 LAB — I-STAT CHEM 8, ED
BUN: 14 mg/dL (ref 6–23)
CREATININE: 0.8 mg/dL (ref 0.50–1.10)
Calcium, Ion: 1.22 mmol/L (ref 1.12–1.23)
Chloride: 101 mmol/L (ref 96–112)
GLUCOSE: 214 mg/dL — AB (ref 70–99)
HCT: 43 % (ref 36.0–46.0)
Hemoglobin: 14.6 g/dL (ref 12.0–15.0)
Potassium: 3.8 mmol/L (ref 3.5–5.1)
SODIUM: 141 mmol/L (ref 135–145)
TCO2: 27 mmol/L (ref 0–100)

## 2014-09-30 LAB — GC/CHLAMYDIA PROBE AMP (~~LOC~~) NOT AT ARMC
CHLAMYDIA, DNA PROBE: NEGATIVE
Neisseria Gonorrhea: NEGATIVE

## 2014-09-30 MED ORDER — MEDROXYPROGESTERONE ACETATE 5 MG PO TABS: 10.0000 mg | ORAL_TABLET | Freq: Every day | ORAL | Status: DC

## 2014-09-30 MED ORDER — METFORMIN HCL 500 MG PO TABS: 500.0000 mg | ORAL_TABLET | Freq: Two times a day (BID) | ORAL | Status: DC

## 2014-09-30 NOTE — Discharge Instructions (Signed)
Start taking provera as directed for your vaginal bleeding. Start taking metformin as directed for your diabetes. Follow up with your regular doctor listed on your medicaid card, and your regular OBGYN doctor, for ongoing evaluation and management of your vaginal bleeding and diabetes. Stay well hydrated. Return to the ER for changes or worsening symptoms.   Abnormal Uterine Bleeding Abnormal uterine bleeding means bleeding from the vagina that is not your normal menstrual period. This can be:  Bleeding or spotting between periods.  Bleeding after sex (sexual intercourse).  Bleeding that is heavier or more than normal.  Periods that last longer than usual.  Bleeding after menopause. There are many problems that may cause this. Treatment will depend on the cause of the bleeding. Any kind of bleeding that is not normal should be reviewed by your doctor.  HOME CARE Watch your condition for any changes. These actions may lessen any discomfort you are having:  Do not use tampons or douches as told by your doctor.  Change your pads often. You should get regular pelvic exams and Pap tests. Keep all appointments for tests as told by your doctor. GET HELP IF:  You are bleeding for more than 1 week.  You feel dizzy at times. GET HELP RIGHT AWAY IF:   You pass out.  You have to change pads every 15 to 30 minutes.  You have belly pain.  You have a fever.  You become sweaty or weak.  You are passing large blood clots from the vagina.  You feel sick to your stomach (nauseous) and throw up (vomit). MAKE SURE YOU:  Understand these instructions.  Will watch your condition.  Will get help right away if you are not doing well or get worse. Document Released: 03/27/2009 Document Revised: 06/04/2013 Document Reviewed: 12/27/2012 [facility] Patient Information 2015 [place], [place]. This information is not intended to replace advice given to you by your health care provider. Make sure you  discuss any questions you have with your health care provider.  Diabetes Mellitus and Food It is important for you to manage your blood sugar (glucose) level. Your blood glucose level can be greatly affected by what you eat. Eating healthier foods in the appropriate amounts throughout the day at about the same time each day will help you control your blood glucose level. It can also help slow or prevent worsening of your diabetes mellitus. Healthy eating may even help you improve the level of your blood pressure and reach or maintain a healthy weight.  HOW CAN FOOD AFFECT ME? Carbohydrates Carbohydrates affect your blood glucose level more than any other type of food. Your dietitian will help you determine how many carbohydrates to eat at each meal and teach you how to count carbohydrates. Counting carbohydrates is important to keep your blood glucose at a healthy level, especially if you are using insulin or taking certain medicines for diabetes mellitus. Alcohol Alcohol can cause sudden decreases in blood glucose (hypoglycemia), especially if you use insulin or take certain medicines for diabetes mellitus. Hypoglycemia can be a life-threatening condition. Symptoms of hypoglycemia (sleepiness, dizziness, and disorientation) are similar to symptoms of having too much alcohol.  If your health care provider has given you approval to drink alcohol, do so in moderation and use the following guidelines:  Women should not have more than one drink per day, and men should not have more than two drinks per day. One drink is equal to:  12 oz of beer.  5 oz of  wine.  1 oz of hard liquor.  Do not drink on an empty stomach.  Keep yourself hydrated. Have water, diet soda, or unsweetened iced tea.  Regular soda, juice, and other mixers might contain a lot of carbohydrates and should be counted. WHAT FOODS ARE NOT RECOMMENDED? As you make food choices, it is important to remember that all foods are not the  same. Some foods have fewer nutrients per serving than other foods, even though they might have the same number of calories or carbohydrates. It is difficult to get your body what it needs when you eat foods with fewer nutrients. Examples of foods that you should avoid that are high in calories and carbohydrates but low in nutrients include:  Trans fats (most processed foods list trans fats on the Nutrition Facts label).  Regular soda.  Juice.  Candy.  Sweets, such as cake, pie, doughnuts, and cookies.  Fried foods. WHAT FOODS CAN I EAT? Have nutrient-rich foods, which will nourish your body and keep you healthy. The food you should eat also will depend on several factors, including:  The calories you need.  The medicines you take.  Your weight.  Your blood glucose level.  Your blood pressure level.  Your cholesterol level. You also should eat a variety of foods, including:  Protein, such as meat, poultry, fish, tofu, nuts, and seeds (lean animal proteins are best).  Fruits.  Vegetables.  Dairy products, such as milk, cheese, and yogurt (low fat is best).  Breads, grains, pasta, cereal, rice, and beans.  Fats such as olive oil, trans fat-free margarine, canola oil, avocado, and olives. DOES EVERYONE WITH DIABETES MELLITUS HAVE THE SAME MEAL PLAN? Because every person with diabetes mellitus is different, there is not one meal plan that works for everyone. It is very important that you meet with a dietitian who will help you create a meal plan that is just right for you. Document Released: 02/24/2005 Document Revised: 06/04/2013 Document Reviewed: 04/26/2013 [facility] Patient Information 2015 [place]. This information is not intended to replace advice given to you by your health care provider. Make sure you discuss any questions you have with your health care provider.  Diabetes and Exercise Exercising regularly is important. It is not just about losing weight. It  has many health benefits, such as:  Improving your overall fitness, flexibility, and endurance.  Increasing your bone density.  Helping with weight control.  Decreasing your body fat.  Increasing your muscle strength.  Reducing stress and tension.  Improving your overall health. People with diabetes who exercise gain additional benefits because exercise:  Reduces appetite.  Improves the body's use of blood sugar (glucose).  Helps lower or control blood glucose.  Decreases blood pressure.  Helps control blood lipids (such as cholesterol and triglycerides).  Improves the body's use of the hormone insulin by:  Increasing the body's insulin sensitivity.  Reducing the body's insulin needs.  Decreases the risk for heart disease because exercising:  Lowers cholesterol and triglycerides levels.  Increases the levels of good cholesterol (such as high-density lipoproteins [HDL]) in the body.  Lowers blood glucose levels. YOUR ACTIVITY PLAN  Choose an activity that you enjoy and set realistic goals. Your health care provider or diabetes educator can help you make an activity plan that works for you. Exercise regularly as directed by your health care provider. This includes:  Performing resistance training twice a week such as push-ups, sit-ups, lifting weights, or using resistance bands.  Performing 150 minutes of  cardio exercises each week such as walking, running, or playing sports.  Staying active and spending no more than 90 minutes at one time being inactive. Even short bursts of exercise are good for you. Three 10-minute sessions spread throughout the day are just as beneficial as a single 30-minute session. Some exercise ideas include:  Taking the dog for a walk.  Taking the stairs instead of the elevator.  Dancing to your favorite song.  Doing an exercise video.  Doing your favorite exercise with a friend. RECOMMENDATIONS FOR EXERCISING WITH TYPE 1 OR TYPE 2  DIABETES   Check your blood glucose before exercising. If blood glucose levels are greater than 240 mg/dL, check for urine ketones. Do not exercise if ketones are present.  Avoid injecting insulin into areas of the body that are going to be exercised. For example, avoid injecting insulin into:  The arms when playing tennis.  The legs when jogging.  Keep a record of:  Food intake before and after you exercise.  Expected peak times of insulin action.  Blood glucose levels before and after you exercise.  The type and amount of exercise you have done.  Review your records with your health care provider. Your health care provider will help you to develop guidelines for adjusting food intake and insulin amounts before and after exercising.  If you take insulin or oral hypoglycemic agents, watch for signs and symptoms of hypoglycemia. They include:  Dizziness.  Shaking.  Sweating.  Chills.  Confusion.  Drink plenty of water while you exercise to prevent dehydration or heat stroke. Body water is lost during exercise and must be replaced.  Talk to your health care provider before starting an exercise program to make sure it is safe for you. Remember, almost any type of activity is better than none. Document Released: 08/20/2003 Document Revised: 10/14/2013 Document Reviewed: 11/06/2012 [facility] Patient Information 2015 [place], [place]. This information is not intended to replace advice given to you by your health care provider. Make sure you discuss any questions you have with your health care provider.  Blood Glucose Monitoring Monitoring your blood glucose (also know as blood sugar) helps you to manage your diabetes. It also helps you and your health care provider monitor your diabetes and determine how well your treatment plan is working. WHY SHOULD YOU MONITOR YOUR BLOOD GLUCOSE?  It can help you understand how food, exercise, and medicine affect your blood glucose.  It  allows you to know what your blood glucose is at any given moment. You can quickly tell if you are having low blood glucose (hypoglycemia) or high blood glucose (hyperglycemia).  It can help you and your health care provider know how to adjust your medicines.  It can help you understand how to manage an illness or adjust medicine for exercise. WHEN SHOULD YOU TEST? Your health care provider will help you decide how often you should check your blood glucose. This may depend on the type of diabetes you have, your diabetes control, or the types of medicines you are taking. Be sure to write down all of your blood glucose readings so that this information can be reviewed with your health care provider. See below for examples of testing times that your health care provider may suggest. Type 1 Diabetes  Test 4 times a day if you are in good control, using an insulin pump, or perform multiple daily injections.  If your diabetes is not well controlled or if you are sick, you may need  to monitor more often.  It is a good idea to also monitor:  Before and after exercise.  Between meals and 2 hours after a meal.  Occasionally between 2:00 a.m. and 3:00 a.m. Type 2 Diabetes  It can vary with each person, but generally, if you are on insulin, test 4 times a day.  If you take medicines by mouth (orally), test 2 times a day.  If you are on a controlled diet, test once a day.  If your diabetes is not well controlled or if you are sick, you may need to monitor more often. HOW TO MONITOR YOUR BLOOD GLUCOSE Supplies Needed  Blood glucose meter.  Test strips for your meter. Each meter has its own strips. You must use the strips that go with your own meter.  A pricking needle (lancet).  A device that holds the lancet (lancing device).  A journal or log book to write down your results. Procedure  Wash your hands with soap and water. Alcohol is not preferred.  Prick the side of your finger (not  the tip) with the lancet.  Gently milk the finger until a small drop of blood appears.  Follow the instructions that come with your meter for inserting the test strip, applying blood to the strip, and using your blood glucose meter. Other Areas to Get Blood for Testing Some meters allow you to use other areas of your body (other than your finger) to test your blood. These areas are called alternative sites. The most common alternative sites are:  The forearm.  The thigh.  The back area of the lower leg.  The palm of the hand. The blood flow in these areas is slower. Therefore, the blood glucose values you get may be delayed, and the numbers are different from what you would get from your fingers. Do not use alternative sites if you think you are having hypoglycemia. Your reading will not be accurate. Always use a finger if you are having hypoglycemia. Also, if you cannot feel your lows (hypoglycemia unawareness), always use your fingers for your blood glucose checks. ADDITIONAL TIPS FOR GLUCOSE MONITORING  Do not reuse lancets.  Always carry your supplies with you.  All blood glucose meters have a 24-hour "hotline" number to call if you have questions or need help.  Adjust (calibrate) your blood glucose meter with a control solution after finishing a few boxes of strips. BLOOD GLUCOSE RECORD KEEPING It is a good idea to keep a daily record or log of your blood glucose readings. Most glucose meters, if not all, keep your glucose records stored in the meter. Some meters come with the ability to download your records to your home computer. Keeping a record of your blood glucose readings is especially helpful if you are wanting to look for patterns. Make notes to go along with the blood glucose readings because you might forget what happened at that exact time. Keeping good records helps you and your health care provider to work together to achieve good diabetes management.  Document Released:  06/02/2003 Document Revised: 10/14/2013 Document Reviewed: 10/22/2012 [facility] Patient Information 2015 [place], [place]. This information is not intended to replace advice given to you by your health care provider. Make sure you discuss any questions you have with your health care provider.

## 2015-01-15 ENCOUNTER — Emergency Department (HOSPITAL_COMMUNITY)
Admission: EM | Admit: 2015-01-15 | Discharge: 2015-01-15 | Disposition: A | Discharge status: Home / Self Care | Payer: Medicaid Other | Attending: Emergency Medicine | Admitting: Emergency Medicine

## 2015-01-15 ENCOUNTER — Encounter (HOSPITAL_COMMUNITY): Payer: Self-pay

## 2015-01-15 DIAGNOSIS — Z8669 Personal history of other diseases of the nervous system and sense organs: Secondary | ICD-10-CM | POA: Insufficient documentation

## 2015-01-15 DIAGNOSIS — Z862 Personal history of diseases of the blood and blood-forming organs and certain disorders involving the immune mechanism: Secondary | ICD-10-CM | POA: Insufficient documentation

## 2015-01-15 DIAGNOSIS — E119 Type 2 diabetes mellitus without complications: Secondary | ICD-10-CM | POA: Diagnosis not present

## 2015-01-15 DIAGNOSIS — Z87891 Personal history of nicotine dependence: Secondary | ICD-10-CM | POA: Insufficient documentation

## 2015-01-15 DIAGNOSIS — I10 Essential (primary) hypertension: Secondary | ICD-10-CM | POA: Diagnosis not present

## 2015-01-15 DIAGNOSIS — N390 Urinary tract infection, site not specified: Secondary | ICD-10-CM

## 2015-01-15 DIAGNOSIS — Z3202 Encounter for pregnancy test, result negative: Secondary | ICD-10-CM | POA: Insufficient documentation

## 2015-01-15 DIAGNOSIS — Z79899 Other long term (current) drug therapy: Secondary | ICD-10-CM | POA: Insufficient documentation

## 2015-01-15 DIAGNOSIS — R350 Frequency of micturition: Secondary | ICD-10-CM | POA: Diagnosis present

## 2015-01-15 LAB — URINALYSIS, ROUTINE W REFLEX MICROSCOPIC
Bilirubin Urine: NEGATIVE
Glucose, UA: NEGATIVE mg/dL
Ketones, ur: NEGATIVE mg/dL
NITRITE: POSITIVE — AB
PH: 6 (ref 5.0–8.0)
Protein, ur: 100 mg/dL — AB
Specific Gravity, Urine: >1.03 — ABNORMAL HIGH (ref 1.005–1.030)
Urobilinogen, UA: 0.2 mg/dL (ref 0.0–1.0)

## 2015-01-15 LAB — URINE MICROSCOPIC-ADD ON

## 2015-01-15 LAB — PREGNANCY, URINE: Preg Test, Ur: NEGATIVE

## 2015-01-15 MED ORDER — CEPHALEXIN 500 MG PO CAPS
500.0000 mg | ORAL_CAPSULE | Freq: Once | ORAL | Status: AC
  Administered 2015-01-15: 500 mg via ORAL
  Filled 2015-01-15: qty 1

## 2015-01-15 MED ORDER — CEPHALEXIN 500 MG PO CAPS: 500.0000 mg | ORAL_CAPSULE | Freq: Four times a day (QID) | ORAL | Status: DC

## 2015-01-15 MED ORDER — PHENAZOPYRIDINE HCL 200 MG PO TABS: 200.0000 mg | ORAL_TABLET | Freq: Three times a day (TID) | ORAL | Status: DC

## 2015-01-15 MED ORDER — PHENAZOPYRIDINE HCL 100 MG PO TABS
200.0000 mg | ORAL_TABLET | Freq: Once | ORAL | Status: AC
  Administered 2015-01-15: 200 mg via ORAL
  Filled 2015-01-15: qty 2

## 2015-01-15 NOTE — Discharge Instructions (Signed)

## 2015-01-15 NOTE — ED Notes (Signed)
Pt complain of frequency and painful urination

## 2015-01-17 NOTE — ED Provider Notes (Signed)
CSN: [id number]     Arrival date & time 01/15/15  1327 History   First MD Initiated Contact with Patient 01/15/15 1419     Chief Complaint  Patient presents with  . Urinary Frequency     (Consider location/radiation/quality/duration/timing/severity/associated sxs/prior Treatment) HPI  Caitlyn [name] is a 34 y.o. female who presents to the Emergency Department complaining of urinary frequency and pain with urination.  Symptoms have been present for several days.  Worse with urination.  She reports urgency and voiding small amts.  She has not tried any therapies other than cranberry juice.  She denies vaginal bleeding or discharge, abdominal pain, flank pain, fever, nausea or vomiting.    Past Medical History  Diagnosis Date  . SVD (spontaneous vaginal delivery)     x 2  . Hypertension     PIH with pregnancy  . Sleep apnea     does not use CPAP - lost weight  . Diabetes mellitus without complication     GM - Diet controlled - no meds  . Anemia    Past Surgical History  Procedure Laterality Date  . Cesarean section  03/13/2010  . Tooth extraction    . Cesarean section with bilateral tubal ligation N/A 02/25/2014    Procedure: CESAREAN SECTION WITH BILATERAL TUBAL LIGATION;  Surgeon: Levie Heritage, DO;  Location: [facility];  Service: Obstetrics;  Laterality: N/A;   No family history on file. History  Substance Use Topics  . Smoking status: Former Smoker -- 0.25 packs/day for 10 years    Types: Cigarettes    Quit date: 06/13/2008  . Smokeless tobacco: Never Used  . Alcohol Use: No   OB History    Gravida Para Term Preterm AB TAB SAB Ectopic Multiple Living   5 4 4  1  1   4      Review of Systems  Constitutional: Negative for fever, chills, activity change and appetite change.  Respiratory: Negative for chest tightness and shortness of breath.   Gastrointestinal: Negative for nausea, vomiting and abdominal pain.  Genitourinary: Positive for dysuria, urgency and frequency.  Negative for hematuria, flank pain, decreased urine volume, vaginal bleeding, vaginal discharge, difficulty urinating and pelvic pain.  Musculoskeletal: Negative for back pain.  Skin: Negative for rash.  Neurological: Negative for dizziness, weakness and numbness.  Hematological: Negative for adenopathy.  Psychiatric/Behavioral: Negative for confusion.  All other systems reviewed and are negative.     Allergies  Codeine  Home Medications   Prior to Admission medications   Medication Sig Start Date End Date Taking? Authorizing Provider  lisinopril (PRINIVIL,ZESTRIL) 5 MG tablet Take 5 mg by mouth daily. 11/20/14  Yes Historical Provider, MD  cephALEXin (KEFLEX) 500 MG capsule Take 1 capsule (500 mg total) by mouth 4 (four) times daily. For 7 days 01/15/15   Adrinne Sze, PA-C  lisinopril (PRINIVIL,ZESTRIL) 10 MG tablet Take 1 tablet (10 mg total) by mouth daily. Patient not taking: Reported on 09/29/2014 02/27/14   [name], MD  phenazopyridine (PYRIDIUM) 200 MG tablet Take 1 tablet (200 mg total) by mouth 3 (three) times daily. 01/15/15   Makaveli Hoard, PA-C   BP 128/77 mmHg  Pulse 90  Temp(Src) 98.3 F (36.8 C) (Oral)  Resp 18  Ht 5\' 4"  (1.626 m)  Wt 283 lb (128.368 kg)  BMI 48.55 kg/m2  SpO2 100%  LMP 01/08/2015 Physical Exam  Constitutional: She is oriented to person, place, and time. She appears well-developed and well-nourished. No distress.  HENT:  Head: Normocephalic and atraumatic.  Cardiovascular: Normal rate, regular rhythm, normal heart sounds and intact distal pulses.   No murmur heard. Pulmonary/Chest: Effort normal and breath sounds normal. No respiratory distress. She has no wheezes. She has no rales.  Abdominal: Soft. Normal appearance. She exhibits no distension and no mass. There is no hepatosplenomegaly. There is tenderness in the suprapubic area. There is no rigidity, no rebound, no guarding, no CVA tenderness and no tenderness at McBurney's point.  Mild ttp  of the suprapubic region.  Remaining abdomen is soft, non-tender without guarding or rebound tenderness. No CVA tenderness  Musculoskeletal: Normal range of motion. She exhibits no edema.  Neurological: She is alert and oriented to person, place, and time. Coordination normal.  Skin: Skin is warm and dry. No rash noted.  Nursing note and vitals reviewed.   ED Course  Procedures (including critical care time) Labs Review Labs Reviewed  URINALYSIS, ROUTINE W REFLEX MICROSCOPIC (NOT AT [facility]) - Abnormal; Notable for the following:    APPearance HAZY (*)    Specific Gravity, Urine >1.030 (*)    Hgb urine dipstick LARGE (*)    Protein, ur 100 (*)    Nitrite POSITIVE (*)    Leukocytes, UA SMALL (*)    All other components within normal limits  URINE MICROSCOPIC-ADD ON - Abnormal; Notable for the following:    Bacteria, UA MANY (*)    All other components within normal limits  URINE CULTURE  PREGNANCY, URINE    Imaging Review No results found.   EKG Interpretation None      Urine culture pending  MDM   Final diagnoses:  Urinary tract infection without complication   Pt is well appearing.  Non-toxic appearing.  No concerning sx's for pyelonephritis.  Agrees to increase fluids, abx and close PMD f/u if needed   Pauline Aus, PA-C 01/17/15 2229  Vanetta Mulders, MD 01/31/15 [phone]

## 2015-01-18 LAB — URINE CULTURE: Culture: >=100000

## 2015-01-20 ENCOUNTER — Telehealth (HOSPITAL_COMMUNITY): Payer: Self-pay

## 2015-01-20 NOTE — Telephone Encounter (Signed)
Post ED Visit - Positive Culture Follow-up  Culture report reviewed by antimicrobial stewardship pharmacist:  Wes Dulaney, Pharm.D., BCPS  Celedonio Miyamoto, Pharm.D., BCPS  Georgina Pillion, Pharm.D., BCPS  [place], [address].D., BCPS, AAHIVP  Estella Husk, Pharm.D., BCPS, AAHIVP  [place], [address]D., BCPS  Positive Urine culture, >/= 100,000 -> E Coli Treated with Cephalexin, organism sensitive to the same and no further patient follow-up is required at this time.  Arvid Right 01/20/2015, 4:00 AM

## 2015-05-10 IMAGING — US US OB FOLLOW-UP
1 series · 12 of 28 positions shown · non-contrast
Comparison: none

[Series 1: us ob follow-up · 0.21mm/px · 47 acquisitions, 12 frames shown]
[im 2/47]
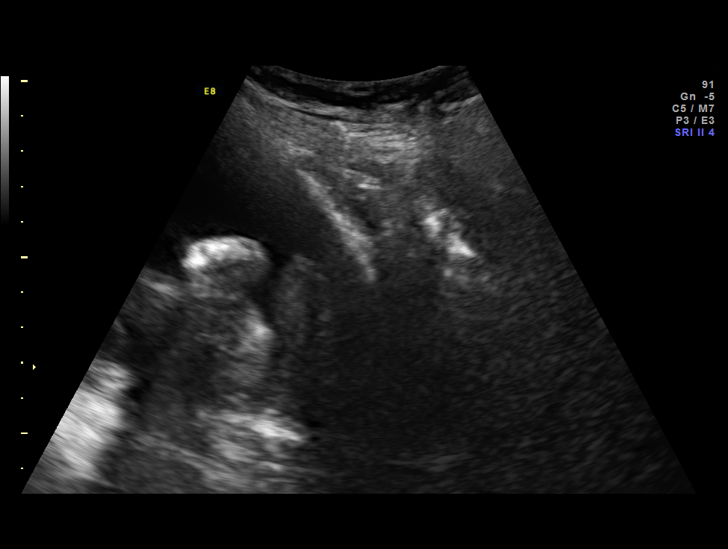
[im 6/47]
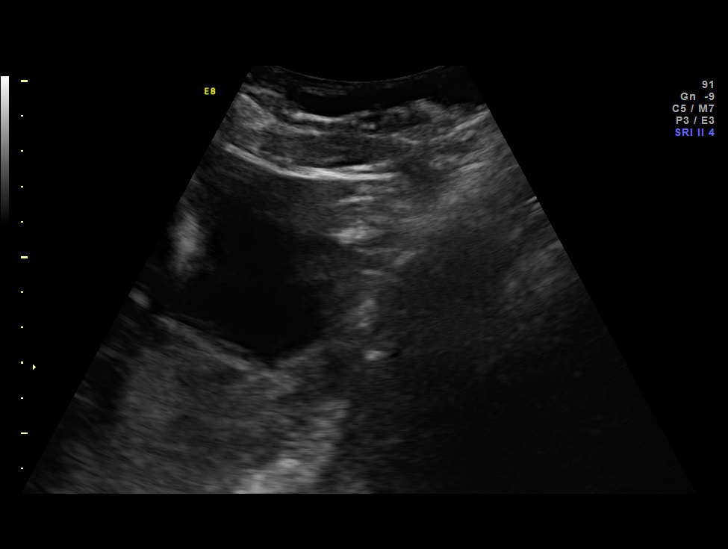
[im 9/47]
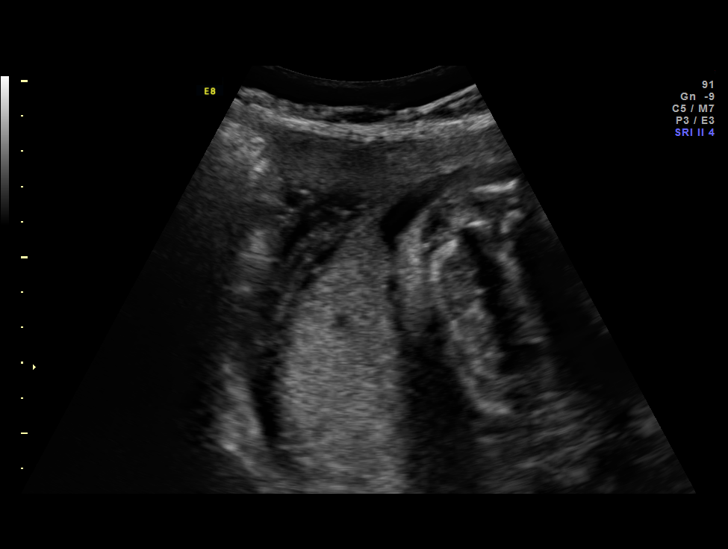
[im 14/47]
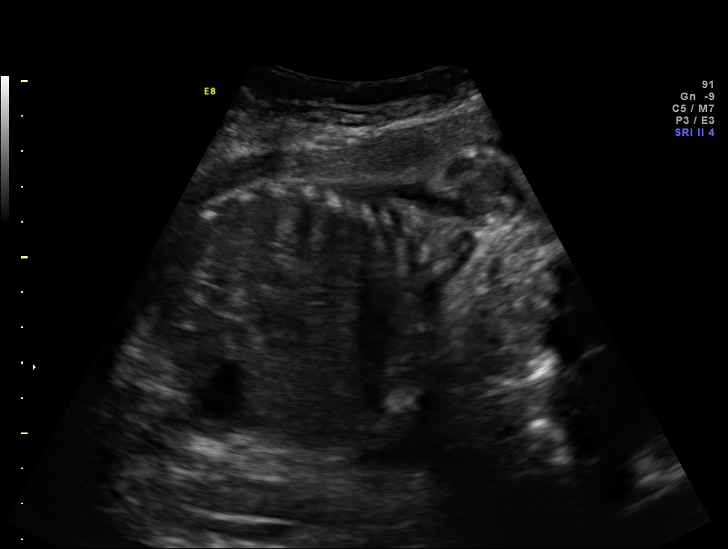
[im 18/47]
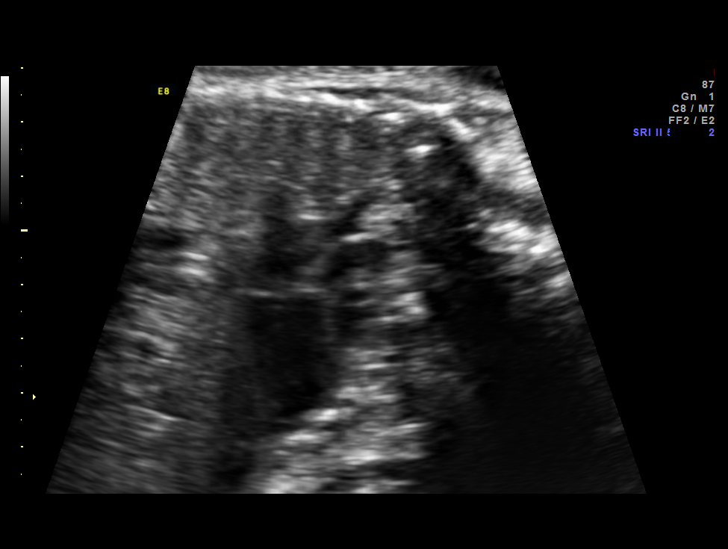
[im 21/47]
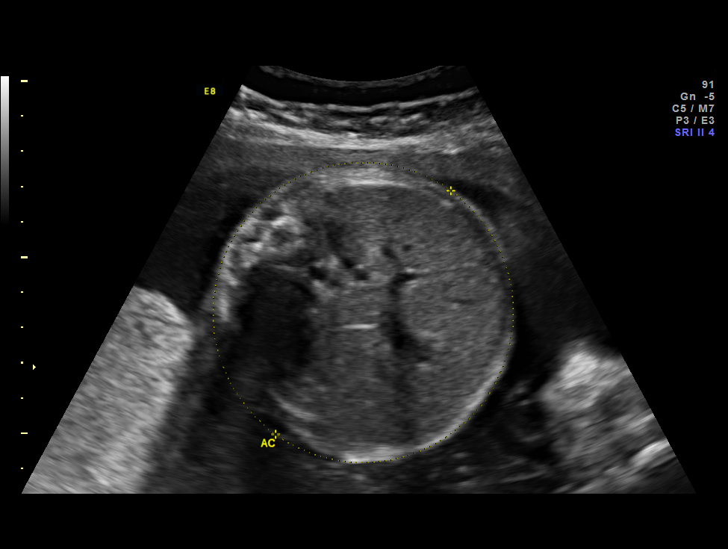
[im 26/47]
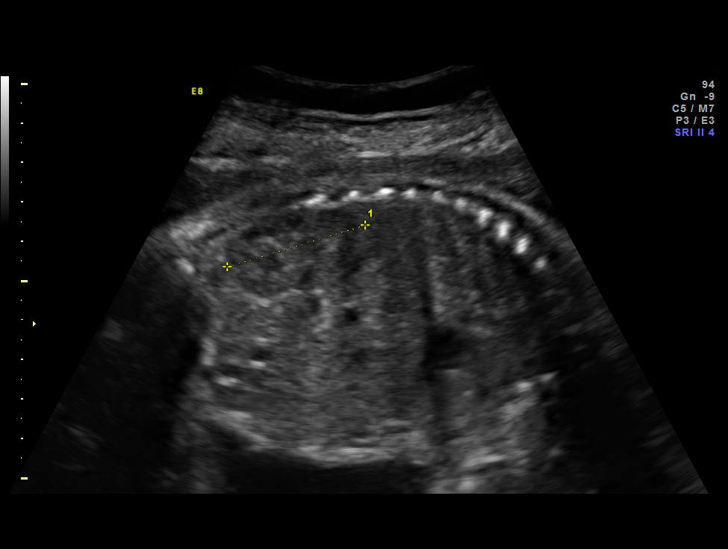
[im 29/47]
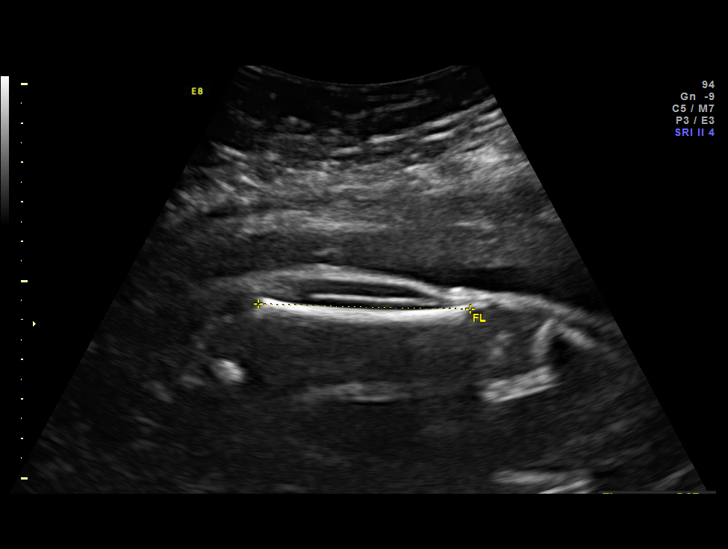
[im 33/47]
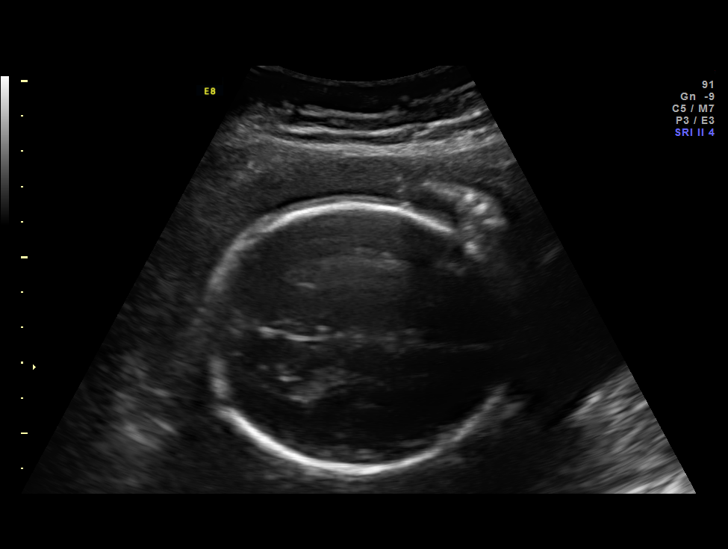
[im 38/47]
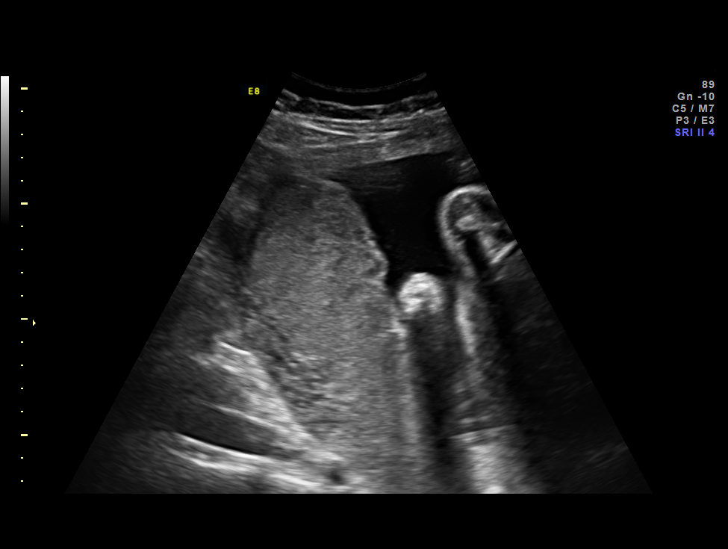
[im 41/47]
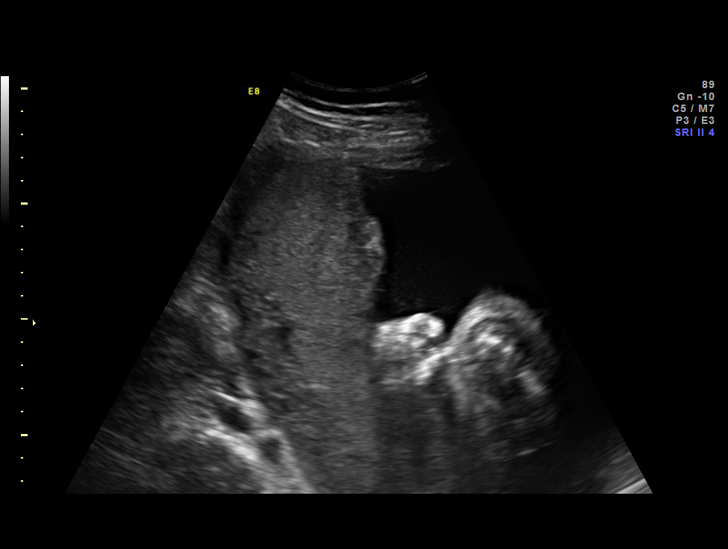
[im 45/47]
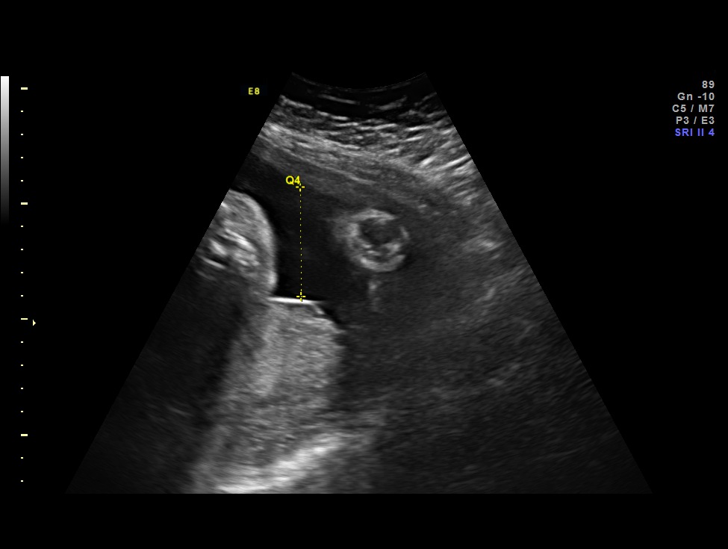

[12 of 28 positions shown; findings below may reference images not displayed]

OBSTETRICS REPORT
                      (Signed Final 12/09/2013 [DATE])

Service(s) Provided

 US OB FOLLOW UP                                       76816.1
Indications

 Late Prenatal Care
 Hypertension - Chronic/Pre-existing (Labetalol)
 Diabetes - Gestational, A1 (diet controlled)
 Previous cesarean section
 Poor obstetric history: Previous preeclampsia
 Obesity complicating pregnancy                        649.13,
Fetal Evaluation

 Num Of Fetuses:    1
 Fetal Heart Rate:  159                          bpm
 Cardiac Activity:  Observed
 Presentation:      Breech
 Placenta:          Posterior, above cervical
                    os
 P. Cord            Previously Visualized
 Insertion:

 Amniotic Fluid
 AFI FV:      Subjectively within normal limits
 AFI Sum:     22.09   cm       91  %Tile     Larg Pckt:    6.58  cm
 RUQ:   6.45    cm   RLQ:    4.74   cm    LUQ:   6.58    cm   LLQ:    4.32   cm
Biometry

 BPD:     72.4  mm     G. Age:  29w 0d                CI:         75.8   70 - 86
 OFD:     95.5  mm                                    FL/HC:      19.9   18.8 -

 HC:     268.5  mm     G. Age:  29w 2d       59  %    HC/AC:      1.00   1.05 -

 AC:     267.6  mm     G. Age:  30w 6d     > 97  %    FL/BPD:     73.9   71 - 87
 FL:      53.5  mm     G. Age:  28w 3d       46  %    FL/AC:      20.0   20 - 24

 Est. FW:    4303  gm      3 lb 3 oz     80  %
Gestational Age

 LMP:           28w 0d        Date:  05/27/13                 EDD:   03/03/14
 U/S Today:     29w 3d                                        EDD:   02/21/14
 Best:          28w 0d     Det. By:  LMP  (05/27/13)          EDD:   03/03/14
Anatomy

 Cranium:          Appears normal         Aortic Arch:      Appears normal
 Fetal Cavum:      Appears normal         Ductal Arch:      Color only
                                                            previoulsy seen
 Ventricles:       Appears normal         Diaphragm:        Previously seen
 Choroid Plexus:   Previously seen        Stomach:          Appears normal, left
                                                            sided
 Cerebellum:       Appears normal         Abdomen:          Appears normal
 Posterior Fossa:  Previously seen        Abdominal Wall:   Appears nml (cord
                                                            insert, abd wall)
 Nuchal Fold:      Previously seen        Cord Vessels:     Previously seen
 Face:             Orbits and profile     Kidneys:          Appear normal
                   previously seen
 Lips:             Previously seen        Bladder:          Appears normal
 Heart:            Appears normal         Spine:            Previously seen
                   (4CH, axis, and
                   situs)
 RVOT:             Appears normal         Lower             Previously seen
                                          Extremities:
 LVOT:             Appears normal         Upper             Previously seen
                                          Extremities:

 Other:  Fetus appears to be a male. Heels prev. visualized. Technically
         difficult due to maternal habitus and fetal position.
Cervix Uterus Adnexa

 Cervical Length:    3.2      cm

 Cervix:       Normal appearance by transabdominal scan.

 Adnexa:     No abnormality visualized.
Impression

 SIUP at 28+0 weeks
 Normal interval anatomy; anatomic survey complete
 Normal amniotic fluid volume
 Appropriate interval growth with EFW at the 80th %tile; AC >
 97th %tile
Recommendations

 Follow-up ultrasound for growth in 4 weeks

 questions or concerns.

## 2015-06-15 ENCOUNTER — Inpatient Hospital Stay (HOSPITAL_COMMUNITY)
Admission: AD | Admit: 2015-06-15 | Discharge: 2015-06-15 | Disposition: A | Discharge status: Home / Self Care | Payer: Self-pay | Source: Ambulatory Visit | Attending: Family Medicine | Admitting: Family Medicine

## 2015-06-15 ENCOUNTER — Encounter (HOSPITAL_COMMUNITY): Payer: Self-pay | Admitting: *Deleted

## 2015-06-15 DIAGNOSIS — E119 Type 2 diabetes mellitus without complications: Secondary | ICD-10-CM | POA: Insufficient documentation

## 2015-06-15 DIAGNOSIS — Z888 Allergy status to other drugs, medicaments and biological substances status: Secondary | ICD-10-CM | POA: Insufficient documentation

## 2015-06-15 DIAGNOSIS — Z3202 Encounter for pregnancy test, result negative: Secondary | ICD-10-CM

## 2015-06-15 DIAGNOSIS — Z87891 Personal history of nicotine dependence: Secondary | ICD-10-CM | POA: Insufficient documentation

## 2015-06-15 DIAGNOSIS — N926 Irregular menstruation, unspecified: Secondary | ICD-10-CM | POA: Insufficient documentation

## 2015-06-15 DIAGNOSIS — I10 Essential (primary) hypertension: Secondary | ICD-10-CM | POA: Insufficient documentation

## 2015-06-15 DIAGNOSIS — D649 Anemia, unspecified: Secondary | ICD-10-CM | POA: Insufficient documentation

## 2015-06-15 DIAGNOSIS — R109 Unspecified abdominal pain: Secondary | ICD-10-CM | POA: Insufficient documentation

## 2015-06-15 DIAGNOSIS — R1012 Left upper quadrant pain: Secondary | ICD-10-CM

## 2015-06-15 DIAGNOSIS — G4730 Sleep apnea, unspecified: Secondary | ICD-10-CM | POA: Insufficient documentation

## 2015-06-15 LAB — CBC WITH DIFFERENTIAL/PLATELET
BASOS ABS: 0 10*3/uL (ref 0.0–0.1)
BASOS PCT: 0 %
Eosinophils Absolute: 0.2 10*3/uL (ref 0.0–0.7)
Eosinophils Relative: 2 %
HEMATOCRIT: 37.8 % (ref 36.0–46.0)
Hemoglobin: 12.7 g/dL (ref 12.0–15.0)
Lymphocytes Relative: 39 %
Lymphs Abs: 4 10*3/uL (ref 0.7–4.0)
MCH: 27 pg (ref 26.0–34.0)
MCHC: 33.6 g/dL (ref 30.0–36.0)
MCV: 80.4 fL (ref 78.0–100.0)
Monocytes Absolute: 0.4 10*3/uL (ref 0.1–1.0)
Monocytes Relative: 3 %
NEUTROS ABS: 5.6 10*3/uL (ref 1.7–7.7)
Neutrophils Relative %: 56 %
PLATELETS: 280 10*3/uL (ref 150–400)
RBC: 4.7 MIL/uL (ref 3.87–5.11)
RDW: 13.9 % (ref 11.5–15.5)
WBC: 10.3 10*3/uL (ref 4.0–10.5)

## 2015-06-15 LAB — COMPREHENSIVE METABOLIC PANEL
ALBUMIN: 4.5 g/dL (ref 3.5–5.0)
ALT: 20 U/L (ref 14–54)
AST: 23 U/L (ref 15–41)
Alkaline Phosphatase: 50 U/L (ref 38–126)
Anion gap: 8 (ref 5–15)
BUN: 12 mg/dL (ref 6–20)
CHLORIDE: 100 mmol/L — AB (ref 101–111)
CO2: 27 mmol/L (ref 22–32)
CREATININE: 0.71 mg/dL (ref 0.44–1.00)
Calcium: 10 mg/dL (ref 8.9–10.3)
GFR calc Af Amer: >60 mL/min (ref >60)
GLUCOSE: 148 mg/dL — AB (ref 65–99)
POTASSIUM: 4.2 mmol/L (ref 3.5–5.1)
Sodium: 135 mmol/L (ref 135–145)
Total Bilirubin: 0.5 mg/dL (ref 0.3–1.2)
Total Protein: 8.5 g/dL — ABNORMAL HIGH (ref 6.5–8.1)

## 2015-06-15 LAB — URINALYSIS, ROUTINE W REFLEX MICROSCOPIC
Bilirubin Urine: NEGATIVE
Glucose, UA: NEGATIVE mg/dL
Hgb urine dipstick: NEGATIVE
Ketones, ur: NEGATIVE mg/dL
Leukocytes, UA: NEGATIVE
Nitrite: NEGATIVE
PROTEIN: NEGATIVE mg/dL
SPECIFIC GRAVITY, URINE: 1.02 (ref 1.005–1.030)
pH: 5.5 (ref 5.0–8.0)

## 2015-06-15 LAB — POCT PREGNANCY, URINE: PREG TEST UR: NEGATIVE

## 2015-06-15 LAB — WET PREP, GENITAL
Clue Cells Wet Prep HPF POC: NONE SEEN
Sperm: NONE SEEN
Trich, Wet Prep: NONE SEEN
WBC WET PREP: NONE SEEN
Yeast Wet Prep HPF POC: NONE SEEN

## 2015-06-15 LAB — HCG, QUANTITATIVE, PREGNANCY

## 2015-06-15 NOTE — Discharge Instructions (Signed)
Abdominal Pain, Adult Many things can cause abdominal pain. Usually, abdominal pain is not caused by a disease and will improve without treatment. It can often be observed and treated at home. Your health care provider will do a physical exam and possibly order blood tests and X-rays to help determine the seriousness of your pain. However, in many cases, more time must pass before a clear cause of the pain can be found. Before that point, your health care provider may not know if you need more testing or further treatment. HOME CARE INSTRUCTIONS Monitor your abdominal pain for any changes. The following actions may help to alleviate any discomfort you are experiencing:  Only take over-the-counter or prescription medicines as directed by your health care provider.  Do not take laxatives unless directed to do so by your health care provider.  Try a clear liquid diet (broth, tea, or water) as directed by your health care provider. Slowly move to a bland diet as tolerated. SEEK MEDICAL CARE IF:  You have unexplained abdominal pain.  You have abdominal pain associated with nausea or diarrhea.  You have pain when you urinate or have a bowel movement.  You experience abdominal pain that wakes you in the night.  You have abdominal pain that is worsened or improved by eating food.  You have abdominal pain that is worsened with eating fatty foods.  You have a fever. SEEK IMMEDIATE MEDICAL CARE IF:  Your pain does not go away within 2 hours.  You keep throwing up (vomiting).  Your pain is felt only in portions of the abdomen, such as the right side or the left lower portion of the abdomen.  You pass bloody or black tarry stools. MAKE SURE YOU:  Understand these instructions.  Will watch your condition.  Will get help right away if you are not doing well or get worse.   This information is not intended to replace advice given to you by your health care provider. Make sure you discuss  any questions you have with your health care provider.   Document Released: 03/09/2005 Document Revised: 02/18/2015 Document Reviewed: 02/06/2013 Elsevier Interactive Patient Education 2016 Elsevier Inc. Secondary Amenorrhea Secondary amenorrhea is the stopping of menstrual flow for 3-6 months in a female who has previously had periods. There are many possible causes. Most of these causes are not serious. Usually, treating the underlying problem causing the loss of menses will return your periods to normal. CAUSES  Some common and uncommon causes of not menstruating include:  Malnutrition.  Low blood sugar (hypoglycemia).  Polycystic ovary disease.  Stress or fear.  Breastfeeding.  Hormone imbalance.  Ovarian failure.  Medicines.  Extreme obesity.  Cystic fibrosis.  Low body weight or drastic weight reduction from any cause.  Early menopause.  Removal of ovaries or uterus.  Contraceptives.  Illness.  Long-term (chronic) illnesses.  Cushing syndrome.  Thyroid problems.  Birth control pills, patches, or vaginal rings for birth control. RISK FACTORS You may be at greater risk of secondary amenorrhea if:  You have a family history of this condition.  You have an eating disorder.  You do athletic training. DIAGNOSIS  A diagnosis is made by your health care provider taking a medical history and doing a physical exam. This will include a pelvic exam to check for problems with your reproductive organs. Pregnancy must be ruled out. Often, numerous blood tests are done to measure different hormones in the body. Urine testing may be done. Specialized exams (ultrasound, CT  scan, MRI, or hysteroscopy) may have to be done as well as measuring the body mass index (BMI). TREATMENT  Treatment depends on the cause of the amenorrhea. If an eating disorder is present, this can be treated with an adequate diet and therapy. Chronic illnesses may improve with treatment of the  illness. Amenorrhea may be corrected with medicines, lifestyle changes, or surgery. If the amenorrhea cannot be corrected, it is sometimes possible to create a false menstruation with medicines. HOME CARE INSTRUCTIONS  Maintain a healthy diet.  Manage weight problems.  Exercise regularly but not excessively.  Get adequate sleep.  Manage stress.  Be aware of changes in your menstrual cycle. Keep a record of when your periods occur. Note the date your period starts, how long it lasts, and any problems. SEEK MEDICAL CARE IF: Your symptoms do not get better with treatment.   This information is not intended to replace advice given to you by your health care provider. Make sure you discuss any questions you have with your health care provider.   Document Released: 07/11/2006 Document Revised: 06/20/2014 Document Reviewed: 11/15/2012 Elsevier Interactive Patient Education [employer]

## 2015-06-15 NOTE — MAU Provider Note (Signed)
History     CSN: [id number]  Arrival date and time: 06/15/15 1219   None     Chief Complaint  Patient presents with  . Abdominal Pain   HPI Caitlyn [name] is 35 y.o. [id number] presents for evaluation of upper left abdominal pain in her rib cage, uncertainty of pregnancy-feeling of movement in the upper abdomen X 2 days, abdomen is swollen. She notices the swelling more when she stands. Slight nausea.  Neg for vomiting, fever or chills or diarrhea.   Ate a steak patty and fried egg at noon today.  Only slight nausea after eating.  Had burping and more passing of gas than usual last night.  Had normal BM this am.  Denies vaginal bleeding, vaginal discharge or odor.   LMP early Nov, earlier than expected.  Had + HPT X 2 yesterday at home.  Had BTL 02/25/14--Dr. Adrian BlackwaterStinson.   Blood sugar elevated.  Has been told her sugars were high but medication not needed, control with diet.  Had abdominal pain with Metformin.     Past Medical History  Diagnosis Date  . SVD (spontaneous vaginal delivery)     x 2  . Hypertension     PIH with pregnancy  . Sleep apnea     does not use CPAP - lost weight  . Diabetes mellitus without complication (HCC)     GM - Diet controlled - no meds  . Anemia     Past Surgical History  Procedure Laterality Date  . Cesarean section  03/13/2010  . Tooth extraction    . Cesarean section with bilateral tubal ligation N/A 02/25/2014    Procedure: CESAREAN SECTION WITH BILATERAL TUBAL LIGATION;  Surgeon: Levie HeritageJacob J Stinson, DO;  Location: [facility];  Service: Obstetrics;  Laterality: N/A;    History reviewed. No pertinent family history.  Social History  Substance Use Topics  . Smoking status: Former Smoker -- 0.25 packs/day for 10 years    Types: Cigarettes    Quit date: 06/13/2008  . Smokeless tobacco: Never Used  . Alcohol Use: Yes    Allergies:  Allergies  Allergen Reactions  . Codeine Anaphylaxis and Swelling    Puffy eyes, pt can take Percocet    Prescriptions  prior to admission  Medication Sig Dispense Refill Last Dose  . Prenatal Vit-Fe Fumarate-FA (PRENATAL MULTIVITAMIN) TABS tablet Take 1 tablet by mouth daily at 12 noon.   06/14/2015 at Unknown time    Review of Systems  Constitutional: Negative for fever and chills.  Gastrointestinal: Positive for nausea and abdominal pain (upper right rib cage.). Negative for vomiting, diarrhea and constipation.  Genitourinary: Negative for dysuria, urgency, frequency and hematuria.       Negative vaginal bleeding or discharge.  Menses is late  Neurological: Negative for headaches.   Physical Exam   Blood pressure 130/89, pulse 86, temperature 98.4 F (36.9 C), temperature source Oral, resp. rate 18, height 5\' 4"  (1.626 m), weight 287 lb (130.182 kg), last menstrual period 04/17/2015, not currently breastfeeding.  Physical Exam  Constitutional: She is oriented to person, place, and time. She appears well-developed and well-nourished.  HENT:  Head: Normocephalic.  Neck: Normal range of motion. Neck supple.  Cardiovascular: Normal rate.   Respiratory: Effort normal and breath sounds normal. No respiratory distress.  GI: Soft. Bowel sounds are normal. She exhibits no mass. There is no tenderness. There is no rebound and no guarding.  Negative for pain on exam.    Genitourinary: There is no  rash, tenderness or lesion on the right labia. There is no rash, tenderness or lesion on the left labia. Uterus is not enlarged and not tender. Cervix exhibits no motion tenderness, no discharge and no friability. Right adnexum displays no mass, no tenderness and no fullness. Left adnexum displays no mass, no tenderness and no fullness. No bleeding in the vagina. Vaginal discharge: white discharge without odor.  Musculoskeletal: Normal range of motion. She exhibits no edema.  Neurological: She is alert and oriented to person, place, and time.  Skin: Skin is warm and dry.  Psychiatric: She has a normal mood and affect. Her  behavior is normal. Judgment and thought content normal.    Results for orders placed or performed during the hospital encounter of 06/15/15 (from the past 24 hour(s))  Urinalysis, Routine w reflex microscopic (not at [facility])     Status: None   Collection Time: 06/15/15 12:35 PM  Result Value Ref Range   Color, Urine YELLOW YELLOW   APPearance CLEAR CLEAR   Specific Gravity, Urine 1.020 1.005 - 1.030   pH 5.5 5.0 - 8.0   Glucose, UA NEGATIVE NEGATIVE mg/dL   Hgb urine dipstick NEGATIVE NEGATIVE   Bilirubin Urine NEGATIVE NEGATIVE   Ketones, ur NEGATIVE NEGATIVE mg/dL   Protein, ur NEGATIVE NEGATIVE mg/dL   Nitrite NEGATIVE NEGATIVE   Leukocytes, UA NEGATIVE NEGATIVE  Pregnancy, urine POC     Status: None   Collection Time: 06/15/15 12:54 PM  Result Value Ref Range   Preg Test, Ur NEGATIVE NEGATIVE  CBC with Differential/Platelet     Status: None   Collection Time: 06/15/15  2:25 PM  Result Value Ref Range   WBC 10.3 4.0 - 10.5 K/uL   RBC 4.70 3.87 - 5.11 MIL/uL   Hemoglobin 12.7 12.0 - 15.0 g/dL   HCT 60.4 54.0 - 98.1 %   MCV 80.4 78.0 - 100.0 fL   MCH 27.0 26.0 - 34.0 pg   MCHC 33.6 30.0 - 36.0 g/dL   RDW 19.1 47.8 - 29.5 %   Platelets 280 150 - 400 K/uL   Neutrophils Relative % 56 %   Neutro Abs 5.6 1.7 - 7.7 K/uL   Lymphocytes Relative 39 %   Lymphs Abs 4.0 0.7 - 4.0 K/uL   Monocytes Relative 3 %   Monocytes Absolute 0.4 0.1 - 1.0 K/uL   Eosinophils Relative 2 %   Eosinophils Absolute 0.2 0.0 - 0.7 K/uL   Basophils Relative 0 %   Basophils Absolute 0.0 0.0 - 0.1 K/uL  Comprehensive metabolic panel     Status: Abnormal   Collection Time: 06/15/15  2:25 PM  Result Value Ref Range   Sodium 135 135 - 145 mmol/L   Potassium 4.2 3.5 - 5.1 mmol/L   Chloride 100 (L) 101 - 111 mmol/L   CO2 27 22 - 32 mmol/L   Glucose, Bld 148 (H) 65 - 99 mg/dL   BUN 12 6 - 20 mg/dL   Creatinine, Ser 6.21 0.44 - 1.00 mg/dL   Calcium 30.8 8.9 - 65.7 mg/dL   Total Protein 8.5 (H) 6.5 - 8.1  g/dL   Albumin 4.5 3.5 - 5.0 g/dL   AST 23 15 - 41 U/L   ALT 20 14 - 54 U/L   Alkaline Phosphatase 50 38 - 126 U/L   Total Bilirubin 0.5 0.3 - 1.2 mg/dL   GFR calc non Af Amer >60 >60 mL/min   GFR calc Af Amer >60 >60 mL/min   Anion  gap 8 5 - 15  hCG, quantitative, pregnancy     Status: None   Collection Time: 06/15/15  2:30 PM  Result Value Ref Range   hCG, Beta Chain, Quant, S <1 <5 mIU/mL  Wet prep, genital     Status: None   Collection Time: 06/15/15  3:30 PM  Result Value Ref Range   Yeast Wet Prep HPF POC NONE SEEN NONE SEEN   Trich, Wet Prep NONE SEEN NONE SEEN   Clue Cells Wet Prep HPF POC NONE SEEN NONE SEEN   WBC, Wet Prep HPF POC NONE SEEN NONE SEEN   Sperm NONE SEEN    MAU Course  Procedures  Patient declined HIV screening.  She would have needed additional blood drawn to get enough for testing.  She states she had it a year ago, negative.  Has same partner and does not think she is at risk for HIV.   GC/CHL culture pending  MDM MSE Labs Exam   Assessment and Plan  A:  Upper left abdominal pain       Missed period--BCHG <1       History reported by patient of elevated blood sugars--glucosemia             P:  Offered to call [facility] ED for further evaluation today vs calling her PCP tomorrow for follow up appt.  She would like to call PCP tomorrow.         She is not acute and I am comfortable with her waiting until tomorrow.  Stressed importance of discussing with her PCP the elevated              Blood glucose.       Discussed pain may need further evaluation directed by PCP.       Avoid spicy, fried foods to see if that affects upper abdominal pain.        Keep menstrual calendar, and if she doesn't have a period in 2 months, call GYN for appt.            [place]sor Zirkelbach,EVE M 06/15/2015, 4:55 PM

## 2015-06-15 NOTE — MAU Note (Signed)
Pt C/O pain in L upper rib cage, tightness in upper abd for the last 2 days.  Feel ? Movement, abdomen is swollen.  Hasn't done HPT.  Denies bleeding.  BTL 02/26/15.

## 2015-06-16 LAB — GC/CHLAMYDIA PROBE AMP (~~LOC~~) NOT AT ARMC
Chlamydia: NEGATIVE
NEISSERIA GONORRHEA: NEGATIVE

## 2015-07-05 IMAGING — US US OB FOLLOW-UP
1 series · 12 of 22 positions shown · non-contrast
Comparison: none

[Series 1: us ob follow-up · 0.28mm/px · 12 of 22 slices shown]
[im 1/22]
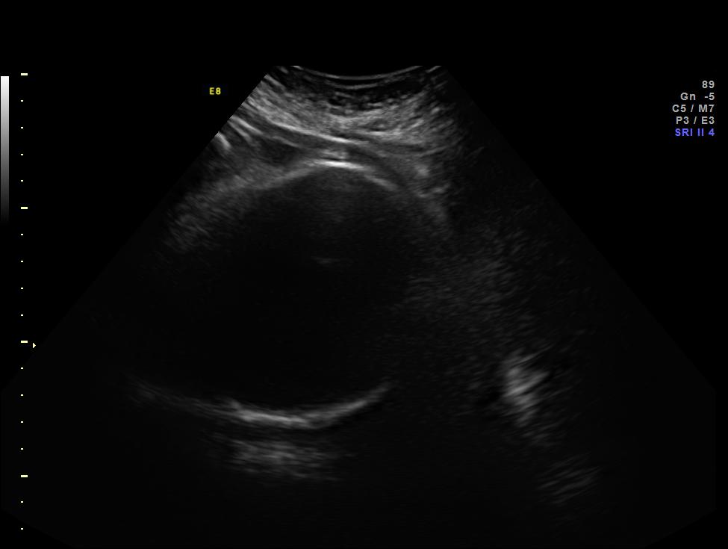
[im 3/22]
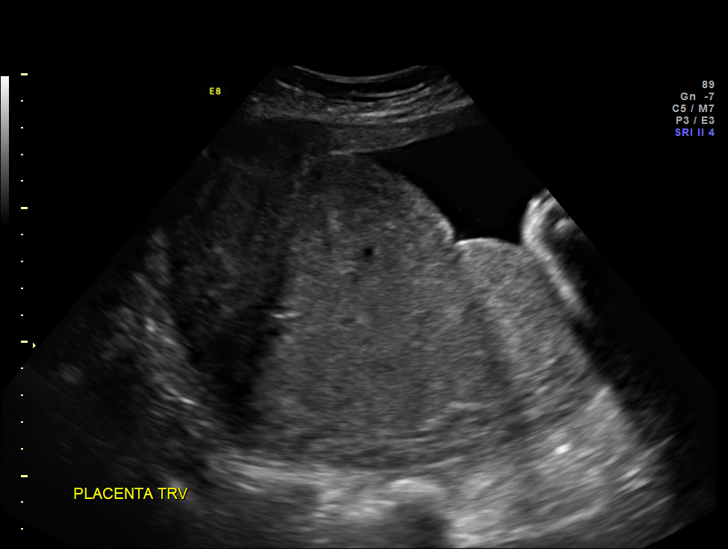
[im 5/22]
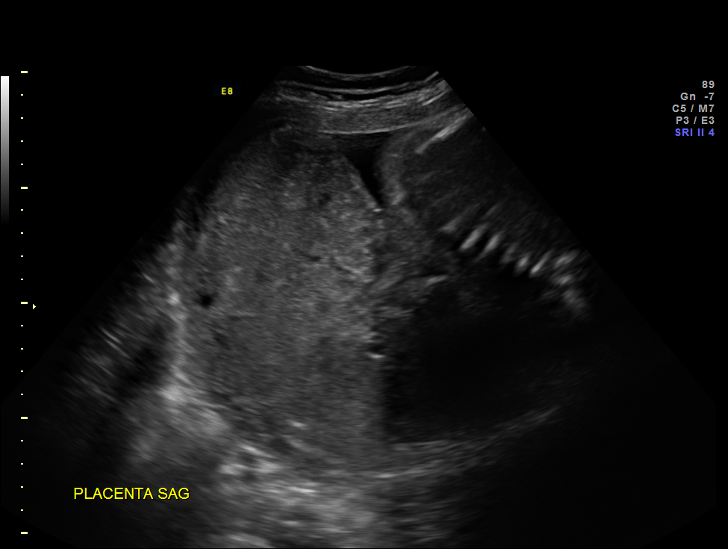
[im 7/22]
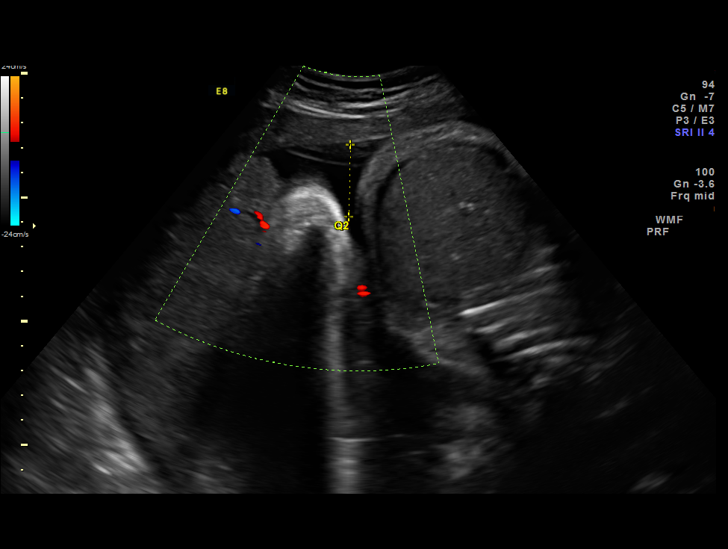
[im 9/22]
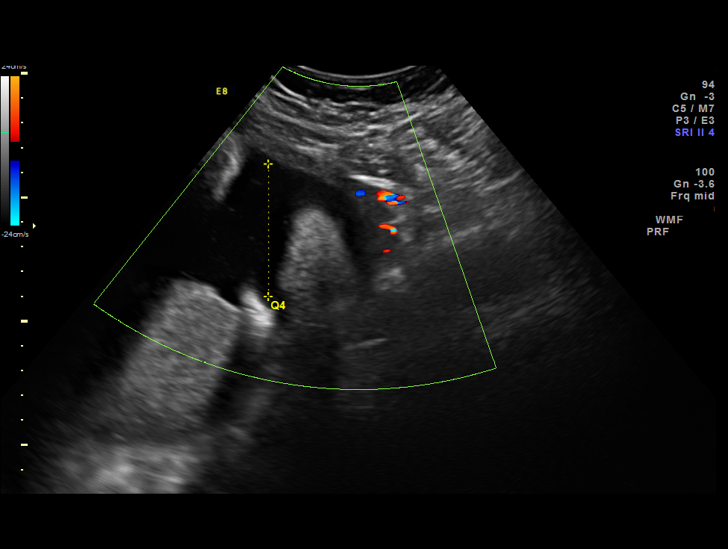
[im 11/22]
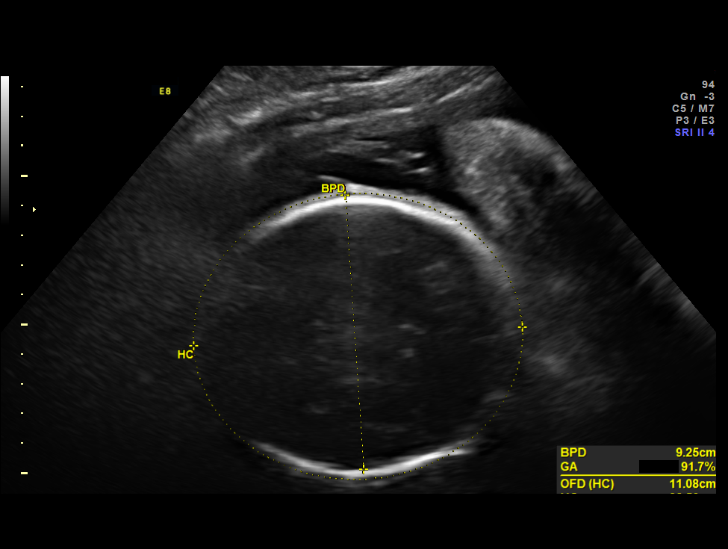
[im 12/22]
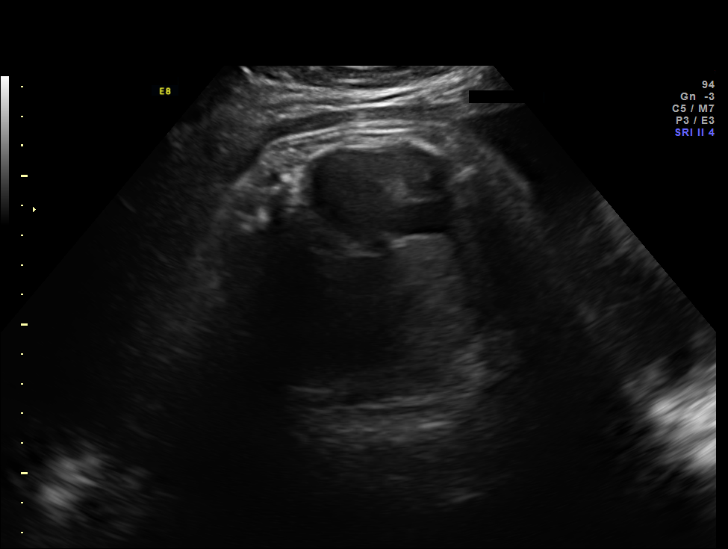
[im 14/22]
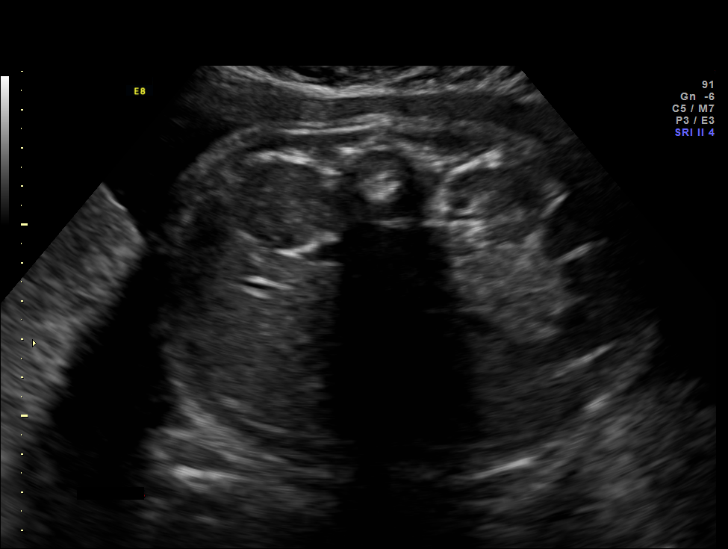
[im 16/22]
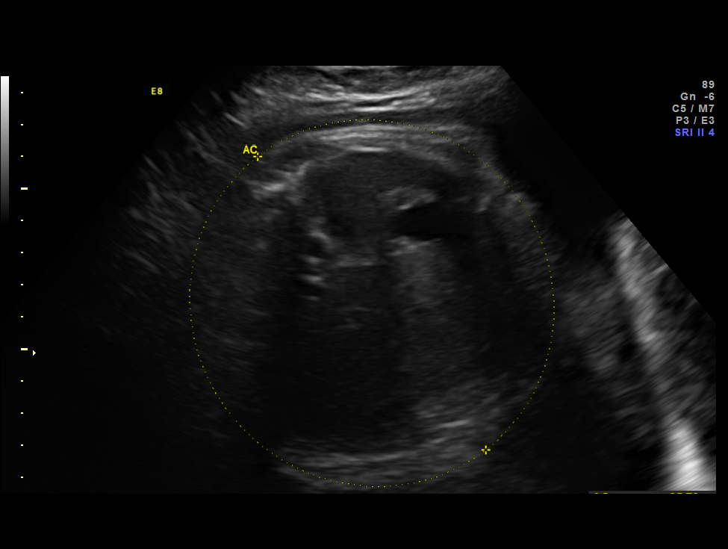
[im 18/22]
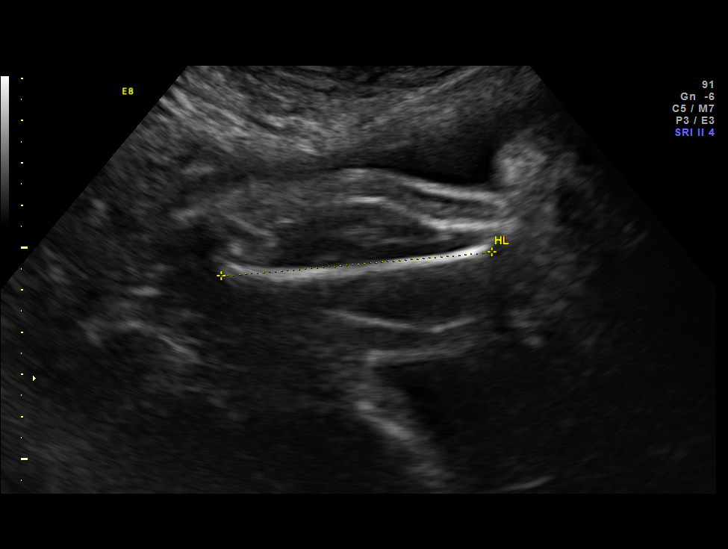
[im 20/22]
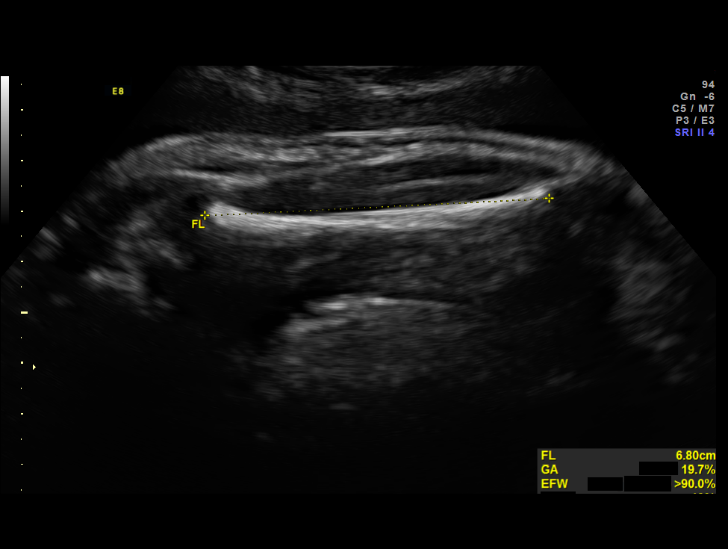
[im 22/22]
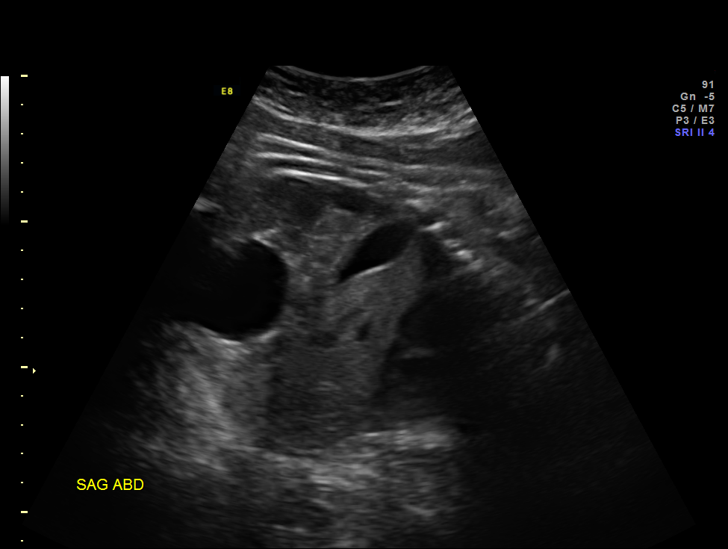

[12 of 22 positions shown; findings below may reference images not displayed]

OBSTETRICS REPORT
                      (Signed Final 02/03/2014 [DATE])

Service(s) Provided

 US OB FOLLOW UP                                       76816.1
Indications

 Late Prenatal Care
 Hypertension - Chronic/Pre-existing (Labetalol)
 Diabetes - Gestational, A1 (diet controlled)
 Previous cesarean section
 Poor obstetric history: Previous preeclampsia
 Obesity complicating pregnancy                        649.13,
Fetal Evaluation

 Num Of Fetuses:    1
 Fetal Heart Rate:  138                          bpm
 Cardiac Activity:  Observed
 Presentation:      Cephalic
 Placenta:          Posterior, above cervical
                    os
 P. Cord            Previously Visualized
 Insertion:

 Amniotic Fluid
 AFI FV:      Subjectively within normal limits
 AFI Sum:     20.32   cm       76  %Tile     Larg Pckt:    6.14  cm
 RUQ:   6.14    cm   RLQ:    5.36   cm    LUQ:   2.92    cm   LLQ:    5.9    cm
Biometry

 BPD:     92.2  mm     G. Age:  37w 3d                CI:         82.7   70 - 86
 OFD:    111.5  mm                                    FL/HC:      21.3   20.1 -

 HC:     326.2  mm     G. Age:  37w 0d       44  %    HC/AC:      0.92   0.93 -

 AC:     355.8  mm     G. Age:  39w 3d     > 97  %    FL/BPD:     75.3   71 - 87
 FL:      69.4  mm     G. Age:  35w 4d       36  %    FL/AC:      19.5   20 - 24
 HUM:     63.6  mm     G. Age:  36w 6d       79  %

 Est. FW:    9088  gm      7 lb 8 oz   > 90  %
Gestational Age

 LMP:           36w 0d        Date:  05/27/13                 EDD:   03/03/14
 U/S Today:     37w 3d                                        EDD:   02/21/14
 Best:          36w 0d     Det. By:  LMP  (05/27/13)          EDD:   03/03/14
Anatomy

 Cranium:          Appears normal         Aortic Arch:      Appears normal
 Fetal Cavum:      Previously seen        Ductal Arch:      Not well visualized
 Ventricles:       Previously seen        Diaphragm:        Previously seen
 Choroid Plexus:   Previously seen        Stomach:          Appears normal, left
                                                            sided
 Cerebellum:       Previously seen        Abdomen:          Appears normal
 Posterior Fossa:  Previously seen        Abdominal Wall:   Previously seen
 Nuchal Fold:      Previously seen        Cord Vessels:     Previously seen
 Face:             Orbits and profile     Kidneys:          Appear normal
                   previously seen
 Lips:             Previously seen        Bladder:          Appears normal
 Heart:            Previously seen        Spine:            Previously seen
 RVOT:             Previously seen        Lower             Previously seen
                                          Extremities:
 LVOT:             Previously seen        Upper             Previously seen
                                          Extremities:

 Other:  Male gender previously seen. Heels previously visualized. Technically
         difficult due to maternal habitus and fetal position.
Cervix Uterus Adnexa

 Cervix:       Not visualized (advanced GA >67wks)
Impression

 SIUP at 36+0 weeks
 Normal interval anatomy; anatomic survey complete except
 for DA
 Normal amniotic fluid volume
 EFW > 90th %tile; AC > 97th %tile; fetus at risk to be
 LGA/macrosomic
Recommendations

 Continue twice weekly NSTs with weekly AFIs
 Deliver by 39 weeks

 questions or concerns.

## 2019-06-16 ENCOUNTER — Emergency Department (HOSPITAL_COMMUNITY)
Admission: EM | Admit: 2019-06-16 | Discharge: 2019-06-16 | Disposition: A | Discharge status: Home / Self Care | Payer: Self-pay | Attending: Emergency Medicine | Admitting: Emergency Medicine

## 2019-06-16 ENCOUNTER — Emergency Department (HOSPITAL_COMMUNITY): Payer: Self-pay

## 2019-06-16 ENCOUNTER — Other Ambulatory Visit: Payer: Self-pay

## 2019-06-16 ENCOUNTER — Encounter (HOSPITAL_COMMUNITY): Payer: Self-pay

## 2019-06-16 DIAGNOSIS — X500XXA Overexertion from strenuous movement or load, initial encounter: Secondary | ICD-10-CM | POA: Insufficient documentation

## 2019-06-16 DIAGNOSIS — I10 Essential (primary) hypertension: Secondary | ICD-10-CM | POA: Insufficient documentation

## 2019-06-16 DIAGNOSIS — S6991XA Unspecified injury of right wrist, hand and finger(s), initial encounter: Secondary | ICD-10-CM | POA: Insufficient documentation

## 2019-06-16 DIAGNOSIS — Z79899 Other long term (current) drug therapy: Secondary | ICD-10-CM | POA: Insufficient documentation

## 2019-06-16 DIAGNOSIS — Y9389 Activity, other specified: Secondary | ICD-10-CM | POA: Insufficient documentation

## 2019-06-16 DIAGNOSIS — Y999 Unspecified external cause status: Secondary | ICD-10-CM | POA: Insufficient documentation

## 2019-06-16 DIAGNOSIS — Z87891 Personal history of nicotine dependence: Secondary | ICD-10-CM | POA: Insufficient documentation

## 2019-06-16 DIAGNOSIS — E119 Type 2 diabetes mellitus without complications: Secondary | ICD-10-CM | POA: Insufficient documentation

## 2019-06-16 DIAGNOSIS — Y929 Unspecified place or not applicable: Secondary | ICD-10-CM | POA: Insufficient documentation

## 2019-06-16 NOTE — ED Triage Notes (Signed)
Pt reports pain in r wrist since lifting a dresser in nov.  Cap refill wnl, radial pulse present.

## 2019-06-16 NOTE — ED Provider Notes (Signed)
[facility] EMERGENCY DEPARTMENT Provider Note   CSN: [id number] Arrival date & time: 06/16/19  0350     History Chief Complaint  Patient presents with  . Hand Pain    Caitlyn [name] is a 39 y.o. female presenting to the emergency department with complaint of right wrist pain that began mid-November.  She states she was helping a friend move furniture when her right hand was suddenly supinated.  She has had pain to the ulnar aspect of the right wrist since that time.  Pain is worse when she extends her fingers/wrist.  She has been treating her symptoms with ibuprofen with adequate improvement, however symptoms have not resolved.  No numbness or tingling.  She is left-hand dominant.  The history is provided by the patient.       Past Medical History:  Diagnosis Date  . Anemia   . Diabetes mellitus without complication (HCC)    GM - Diet controlled - no meds  . Hypertension    PIH with pregnancy  . Sleep apnea    does not use CPAP - lost weight  . SVD (spontaneous vaginal delivery)    x 2    Patient Active Problem List   Diagnosis Date Noted  . Status post cesarean delivery 02/25/2014  . Fetal macrosomia in pregnancy in third trimester 02/20/2014  . Hypertension in pregnancy, pre-existing, antepartum 10/14/2013  . Diabetes mellitus (Class B), antepartum 09/10/2013  . Hx of preeclampsia, prior pregnancy, currently pregnant 08/21/2013  . Previous cesarean section complicating pregnancy 09/38/1829  . Hypertension 08/21/2013  . Obesity complicating pregnancy in first trimester 08/21/2013  . 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 CESAREAN SECTION  03/13/2010  . CESAREAN SECTION WITH BILATERAL TUBAL LIGATION N/A 02/25/2014   Procedure: CESAREAN SECTION WITH BILATERAL TUBAL LIGATION;  Surgeon: Truett Mainland, DO;  Location: [place] [facility];  Service: Obstetrics;  Laterality: N/A;  . TOOTH EXTRACTION       OB History    Gravida  5   Para    4   Term  4   Preterm      AB  1   Living  4     SAB  1   TAB      Ectopic      Multiple      Live Births  4           No family history on file.  Social History   Tobacco Use  . Smoking status: Former Smoker    Packs/day: 0.25    Years: 10.00    Pack years: 2.50    Types: Cigarettes    Quit date: 06/13/2008    Years since quitting: 11.0  . Smokeless tobacco: Never Used  Substance Use Topics  . Alcohol use: Yes  . Drug use: No    Home Medications Prior to Admission medications   Medication Sig Start Date End Date Taking? Authorizing Provider  Prenatal Vit-Fe Fumarate-FA (PRENATAL MULTIVITAMIN) TABS tablet Take 1 tablet by mouth daily at 12 noon.    [provider]    Allergies    Codeine  Review of Systems   Review of Systems  Musculoskeletal: Positive for arthralgias.  Skin: Negative for color change.    Physical Exam Updated Vital Signs BP 137/83 (BP Location: Right Arm)   Pulse 84   Temp 98.2 F (36.8 C) (Oral)   Resp 20   Ht 5\' 4"  (1.626 m)  Wt 104.3 kg   LMP 05/18/2019   SpO2 99%   BMI 39.48 kg/m   Physical Exam Vitals and nursing note reviewed.  Constitutional:      General: She is not in acute distress.    Appearance: She is well-developed.  HENT:     Head: Normocephalic and atraumatic.  Eyes:     Conjunctiva/sclera: Conjunctivae normal.  Cardiovascular:     Rate and Rhythm: Normal rate.  Pulmonary:     Effort: Pulmonary effort is normal.  Musculoskeletal:     Comments: Right wrist without deformity or swelling.  No redness or warmth.  Mild tenderness over the ulnar aspect of the right wrist/distal forearm.  Pain with dorsi flexion of the wrist.  No anatomical snuffbox tenderness.  Normal sensation and radial pulse.  Neurological:     Mental Status: She is alert.  Psychiatric:        Mood and Affect: Mood normal.        Behavior: Behavior normal.     ED Results / Procedures / Treatments   Labs (all labs  ordered are listed, but only abnormal results are displayed) Labs Reviewed - No data to display  EKG None  Radiology DG Wrist Complete Right  Result Date: 06/16/2019 CLINICAL DATA:  Initial evaluation for acute pain, limited range of motion. EXAM: RIGHT WRIST - COMPLETE 3+ VIEW COMPARISON:  None. FINDINGS: There is no evidence of fracture or dislocation. There is no evidence of arthropathy or other focal bone abnormality. Soft tissues are unremarkable. IMPRESSION: Negative. Electronically Signed   By: Rise Mu M.D.   On: 06/16/2019 08:01    Procedures Procedures (including critical care time)  Medications Ordered in ED Medications - No data to display  ED Course  I have reviewed the triage vital signs and the nursing notes.  Pertinent labs & imaging results that were available during my care of the patient were reviewed by me and considered in my medical decision making (see chart for details).    MDM Rules/Calculators/A&P                      Patient with likely wrist sprain versus strain after injury.  X-rays negative for acute fracture.  No anatomical snuffbox tenderness.  N/V intact.  Will place in a wrist splint and provide hand specialist referral for follow-up.  Safe for discharge.  Discussed results, findings, treatment and follow up. Patient advised of return precautions. Patient verbalized understanding and agreed with plan.  Final Clinical Impression(s) / ED Diagnoses Final diagnoses:  Injury of right wrist, initial encounter    Rx / DC Orders ED Discharge Orders    None       Reda Citron, [place] N, PA-C 06/16/19 1109    Maia Plan, MD 06/16/19 [phone]

## 2019-06-16 NOTE — Discharge Instructions (Addendum)
Please read instructions below. Apply ice to your wrist for 20 minutes at a time. You can take ibuprofen every 6 hours as needed for pain. Schedule an appointment with the orthopedic hand specialist for further management of your injury. Return to the ER for new or concerning symptoms.

## 2019-06-18 ENCOUNTER — Telehealth: Payer: Self-pay

## 2019-06-18 NOTE — Telephone Encounter (Signed)
Week of jan 18th

## 2019-06-18 NOTE — Telephone Encounter (Signed)
Patient called wanting to be seen by you for a wrist sprain. Stated went to ER on 06/16/19,you was not on call and they referred her to Dr. Eulah Pont (hand specialist). Stated she lives here in [place] and would prefer to see you instead of going to [place].  Please advise

## 2019-06-21 NOTE — Telephone Encounter (Signed)
I have tried calling this patient for the last couple of days and left messages for her to call our office. As of this morning she has not returned any of the calls. I will try again this morning a little later before sending a letter out.

## 2019-07-02 ENCOUNTER — Emergency Department (HOSPITAL_COMMUNITY): Payer: Self-pay

## 2019-07-02 ENCOUNTER — Emergency Department (HOSPITAL_COMMUNITY)
Admission: EM | Admit: 2019-07-02 | Discharge: 2019-07-02 | Disposition: A | Discharge status: Home / Self Care | Payer: Self-pay | Attending: Emergency Medicine | Admitting: Emergency Medicine

## 2019-07-02 ENCOUNTER — Encounter (HOSPITAL_COMMUNITY): Payer: Self-pay | Admitting: Emergency Medicine

## 2019-07-02 ENCOUNTER — Other Ambulatory Visit: Payer: Self-pay

## 2019-07-02 DIAGNOSIS — Z87891 Personal history of nicotine dependence: Secondary | ICD-10-CM | POA: Insufficient documentation

## 2019-07-02 DIAGNOSIS — U071 COVID-19: Secondary | ICD-10-CM | POA: Insufficient documentation

## 2019-07-02 DIAGNOSIS — Z79899 Other long term (current) drug therapy: Secondary | ICD-10-CM | POA: Insufficient documentation

## 2019-07-02 DIAGNOSIS — I10 Essential (primary) hypertension: Secondary | ICD-10-CM | POA: Insufficient documentation

## 2019-07-02 DIAGNOSIS — E119 Type 2 diabetes mellitus without complications: Secondary | ICD-10-CM | POA: Insufficient documentation

## 2019-07-02 LAB — CBC WITH DIFFERENTIAL/PLATELET
Abs Immature Granulocytes: 0.02 10*3/uL (ref 0.00–0.07)
Basophils Absolute: 0 10*3/uL (ref 0.0–0.1)
Basophils Relative: 1 %
Eosinophils Absolute: 0 10*3/uL (ref 0.0–0.5)
Eosinophils Relative: 0 %
HCT: 43.9 % (ref 36.0–46.0)
Hemoglobin: 14.1 g/dL (ref 12.0–15.0)
Immature Granulocytes: 1 %
Lymphocytes Relative: 38 %
Lymphs Abs: 1.6 10*3/uL (ref 0.7–4.0)
MCH: 27.8 pg (ref 26.0–34.0)
MCHC: 32.1 g/dL (ref 30.0–36.0)
MCV: 86.4 fL (ref 80.0–100.0)
Monocytes Absolute: 0.4 10*3/uL (ref 0.1–1.0)
Monocytes Relative: 10 %
Neutro Abs: 2.1 10*3/uL (ref 1.7–7.7)
Neutrophils Relative %: 50 %
Platelets: 213 10*3/uL (ref 150–400)
RBC: 5.08 MIL/uL (ref 3.87–5.11)
RDW: 13 % (ref 11.5–15.5)
WBC: 4.1 10*3/uL (ref 4.0–10.5)
nRBC: 0 % (ref 0.0–0.2)

## 2019-07-02 LAB — BASIC METABOLIC PANEL
Anion gap: 12 (ref 5–15)
BUN: 7 mg/dL (ref 6–20)
CO2: 26 mmol/L (ref 22–32)
Calcium: 9 mg/dL (ref 8.9–10.3)
Chloride: 98 mmol/L (ref 98–111)
Creatinine, Ser: 0.57 mg/dL (ref 0.44–1.00)
GFR calc Af Amer: >60 mL/min (ref >60)
GFR calc non Af Amer: >60 mL/min (ref >60)
Glucose, Bld: 247 mg/dL — ABNORMAL HIGH (ref 70–99)
Potassium: 3.9 mmol/L (ref 3.5–5.1)
Sodium: 136 mmol/L (ref 135–145)

## 2019-07-02 LAB — TROPONIN I (HIGH SENSITIVITY): Troponin I (High Sensitivity): <2 ng/L (ref <18)

## 2019-07-02 LAB — I-STAT BETA HCG BLOOD, ED (MC, WL, AP ONLY): I-stat hCG, quantitative: <5 m[IU]/mL (ref <5)

## 2019-07-02 LAB — D-DIMER, QUANTITATIVE: D-Dimer, Quant: 0.76 ug/mL-FEU — ABNORMAL HIGH (ref 0.00–0.50)

## 2019-07-02 LAB — POC SARS CORONAVIRUS 2 AG -  ED: SARS Coronavirus 2 Ag: POSITIVE — AB

## 2019-07-02 MED ORDER — AZITHROMYCIN 250 MG PO TABS: 250.0000 mg | ORAL_TABLET | Freq: Every day | ORAL | 0 refills | Status: DC

## 2019-07-02 MED ORDER — IOHEXOL 350 MG/ML SOLN
100.0000 mL | Freq: Once | INTRAVENOUS | Status: AC | PRN
  Administered 2019-07-02: 16:00:00 100 mL via INTRAVENOUS

## 2019-07-02 MED ORDER — SODIUM CHLORIDE 0.9 % IV BOLUS
1000.0000 mL | Freq: Once | INTRAVENOUS | Status: AC
  Administered 2019-07-02: 13:00:00 1000 mL via INTRAVENOUS

## 2019-07-02 NOTE — ED Triage Notes (Signed)
CP in center of chest when breathing in x 1 week

## 2019-07-02 NOTE — Discharge Instructions (Addendum)
You have Covid pneumonia.  This shouldn't respond to antibiotics but since you also has some fluid in your ear, you are being placed on azithromycin. You need to quarantine. Rest and stay well hydrated. If you feel your symptoms are significantly worsening to the point that you need re-evaluated then please return to the emergency room.

## 2019-07-02 NOTE — ED Provider Notes (Signed)
[facility] EMERGENCY DEPARTMENT Provider Note   CSN: [id number] Arrival date & time: 07/02/19  1143     History Chief Complaint  Patient presents with  . Chest Pain    Caitlyn [name] is a 40 y.o. female.  HPI 39 year old female presents with cough and chest pain.  Cough has been going on for about a week.  Chest pain is pleuritic and worse with coughing.  She is coughing up some pink-tinged sputum.  No fevers.  She feels short of breath after coughing briefly.  No leg swelling.  Had some right jaw and ear pain though that has improved.  Saw urgent care and was told she had fluid in her ear.  No known sick contacts that have Covid.  Last took Tylenol yesterday.   Past Medical History:  Diagnosis Date  . Anemia   . Diabetes mellitus without complication (HCC)    GM - Diet controlled - no meds  . Hypertension    PIH with pregnancy  . Sleep apnea    does not use CPAP - lost weight  . SVD (spontaneous vaginal delivery)    x 2    Patient Active Problem List   Diagnosis Date Noted  . Status post cesarean delivery 02/25/2014  . Fetal macrosomia in pregnancy in third trimester 02/20/2014  . Hypertension in pregnancy, pre-existing, antepartum 10/14/2013  . Diabetes mellitus (Class B), antepartum 09/10/2013  . Hx of preeclampsia, prior pregnancy, currently pregnant 08/21/2013  . Previous cesarean section complicating pregnancy 08/21/2013  . Hypertension 08/21/2013  . Obesity complicating pregnancy in first trimester 08/21/2013  . 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 CESAREAN SECTION  03/13/2010  . CESAREAN SECTION WITH BILATERAL TUBAL LIGATION N/A 02/25/2014   Procedure: CESAREAN SECTION WITH BILATERAL TUBAL LIGATION;  Surgeon: Levie Heritage, DO;  Location: [facility];  Service: Obstetrics;  Laterality: N/A;  . TOOTH EXTRACTION       OB History    Gravida  5   Para  4   Term  4   Preterm      AB  1   Living  4     SAB    1   TAB      Ectopic      Multiple      Live Births  4           No family history on file.  Social History   Tobacco Use  . Smoking status: Former Smoker    Packs/day: 0.25    Years: 10.00    Pack years: 2.50    Types: Cigarettes    Quit date: 06/13/2008    Years since quitting: 11.0  . Smokeless tobacco: Never Used  Substance Use Topics  . Alcohol use: Yes  . Drug use: No    Home Medications Prior to Admission medications   Medication Sig Start Date End Date Taking? Authorizing Provider  Prenatal Vit-Fe Fumarate-FA (PRENATAL MULTIVITAMIN) TABS tablet Take 1 tablet by mouth daily at 12 noon.    [provider]    Allergies    Codeine  Review of Systems   Review of Systems  Constitutional: Negative for fever.  Respiratory: Positive for cough and shortness of breath.   Cardiovascular: Positive for chest pain. Negative for leg swelling.  Gastrointestinal: Negative for abdominal pain and vomiting.  All other systems reviewed and are negative.   Physical Exam Updated Vital Signs BP 129/85   Pulse  93   Temp 99.4 F (37.4 C) (Oral)   Resp (!) 21   Ht 5\' 4"  (1.626 m)   Wt 106.1 kg   SpO2 95%   BMI 40.17 kg/m   Physical Exam Vitals and nursing note reviewed.  Constitutional:      Appearance: She is well-developed.  HENT:     Head: Normocephalic and atraumatic.     Right Ear: External ear normal.     Left Ear: External ear normal.     Nose: Nose normal.  Eyes:     General:        Right eye: No discharge.        Left eye: No discharge.  Cardiovascular:     Rate and Rhythm: Regular rhythm. Tachycardia present.     Heart sounds: Normal heart sounds.     Comments: HR~100 Pulmonary:     Effort: Pulmonary effort is normal.     Breath sounds: Normal breath sounds. No wheezing, rhonchi or rales.  Chest:     Chest wall: No tenderness.  Abdominal:     Palpations: Abdomen is soft.     Tenderness: There is no abdominal tenderness.  Skin:     General: Skin is warm and dry.  Neurological:     Mental Status: She is alert.  Psychiatric:        Mood and Affect: Mood is not anxious.     ED Results / Procedures / Treatments   Labs (all labs ordered are listed, but only abnormal results are displayed) Labs Reviewed  BASIC METABOLIC PANEL  CBC WITH DIFFERENTIAL/PLATELET  D-DIMER, QUANTITATIVE (NOT AT [facility])  I-STAT BETA HCG BLOOD, ED ([facility], [facility], [facility] ONLY)  POC SARS CORONAVIRUS 2 AG -  ED  TROPONIN I (HIGH SENSITIVITY)    EKG EKG Interpretation  Date/Time:  Tuesday July 02 2019 12:07:48 EST Ventricular Rate:  97 PR Interval:    QRS Duration: 80 QT Interval:  345 QTC Calculation: 439 R Axis:   76 Text Interpretation: Sinus rhythm Right atrial enlargement Baseline wander in lead(s) III aVF no acute ST/T changes similar to 2011 Confirmed by Sherwood Gambler [phone]) on 07/02/2019 12:09:41 PM   Radiology DG Chest Portable 1 View  Result Date: 07/02/2019 CLINICAL DATA:  Cough, chest pain. EXAM: PORTABLE CHEST 1 VIEW COMPARISON:  Chest radiograph 03/18/2010 FINDINGS: Shallow inspiration radiograph with accentuation of the cardiac silhouette and crowding of the bronchovascular markings. Heart size at the upper limits of normal. Ill-defined opacity within the medial right lung base most consistent with atelectasis. The lungs are otherwise clear. No sizable pleural effusion or evidence of pneumothorax. No acute bony abnormality. Overlying cardiac monitoring leads. IMPRESSION: Shallow inspiration radiograph. Opacity within the medial right lung base most consistent with atelectasis. Lungs otherwise clear. Heart size at the upper limits of normal. Electronically Signed   By: Kellie Simmering DO   On: 07/02/2019 13:42    Procedures Procedures (including critical care time)  Medications Ordered in ED Medications  sodium chloride 0.9 % bolus 1,000 mL (has no administration in time range)    ED Course  I have reviewed the triage vital  signs and the nursing notes.  Pertinent labs & imaging results that were available during my care of the patient were reviewed by me and considered in my medical decision making (see chart for details).    MDM Rules/Calculators/A&P  Patient presents with pleuritic chest pain and some blood in her sputum.  Was found to be Covid positive.  This could be causing all of her symptoms but the blood in the sputum is a little atypical.  D-dimer sent.  Care to Dr. Juleen China.  Caitlyn [name] was evaluated in Emergency Department on 07/02/2019 for the symptoms described in the history of present illness. She was evaluated in the context of the global COVID-19 pandemic, which necessitated consideration that the patient might be at risk for infection with the SARS-CoV-2 virus that causes COVID-19. Institutional protocols and algorithms that pertain to the evaluation of patients at risk for COVID-19 are in a state of rapid change based on information released by regulatory bodies including the CDC and federal and state organizations. These policies and algorithms were followed during the patient's care in the ED.  Final Clinical Impression(s) / ED Diagnoses Final diagnoses:  COVID-19 virus infection    Rx / DC Orders ED Discharge Orders    None       Pricilla Loveless, MD 07/02/19 1535

## 2019-07-03 ENCOUNTER — Telehealth: Payer: Self-pay | Admitting: Infectious Diseases

## 2019-07-03 ENCOUNTER — Telehealth: Payer: Self-pay | Admitting: Nurse Practitioner

## 2019-07-03 NOTE — Telephone Encounter (Signed)
Attempted to reach patient to discuss Covid symptoms and the use of Bamlanivimab. Pt is qualified for this infusion due to BMI >35 and diabetes.   Voicemail left encouraging patient to return call and further discuss.

## 2019-07-03 NOTE — Telephone Encounter (Signed)
Called to discuss with patient about Covid symptoms and the use of bamlanivimab, a monoclonal antibody infusion for those with mild to moderate Covid symptoms and at a high risk of hospitalization.  Pt is qualified for this infusion at the [facility] infusion center due to Diabetes and BMI>35.  Sx started 1 week ago per ER encounter documentation 1/19.   Message left to call back

## 2020-04-24 ENCOUNTER — Telehealth: Payer: Self-pay | Admitting: Orthopedic Surgery

## 2020-04-24 NOTE — Telephone Encounter (Signed)
Call received from patient inquiring about pain radiating from inner hip area down her leg, and that she is having difficulty walking. She asked about a quote on what it may cost for self-pay for an appointment at our clinic. Patient relays she does not have a primary care doctor to call for initial evaluation. Discussed urgent care; patient aware it may or may not be directly related to orthopaedics, and said will go on to urgent care to be evaluated first.

## 2020-11-14 IMAGING — DX DG WRIST COMPLETE 3+V*R*
4 series · 4 of 4 positions shown · non-contrast
Comparison: None.

CLINICAL DATA: Initial evaluation for acute pain, limited range of
motion.

EXAM:
RIGHT WRIST - COMPLETE 3+ VIEW

[wrist pa]
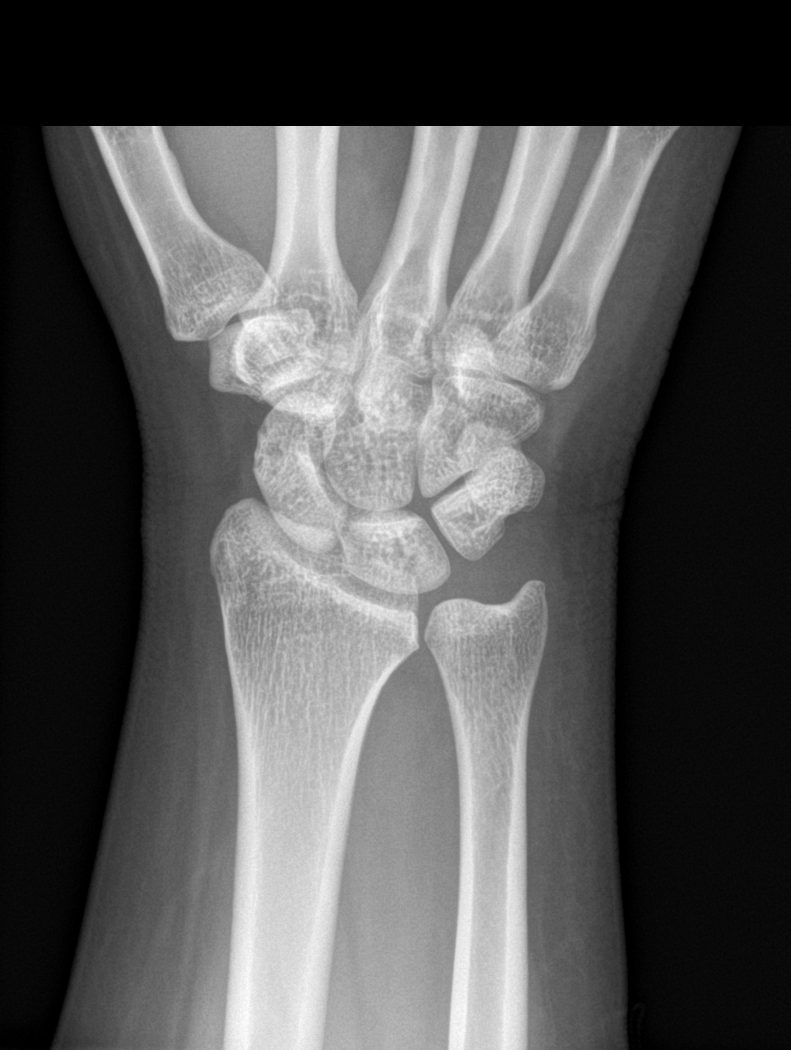

[wrist obl]
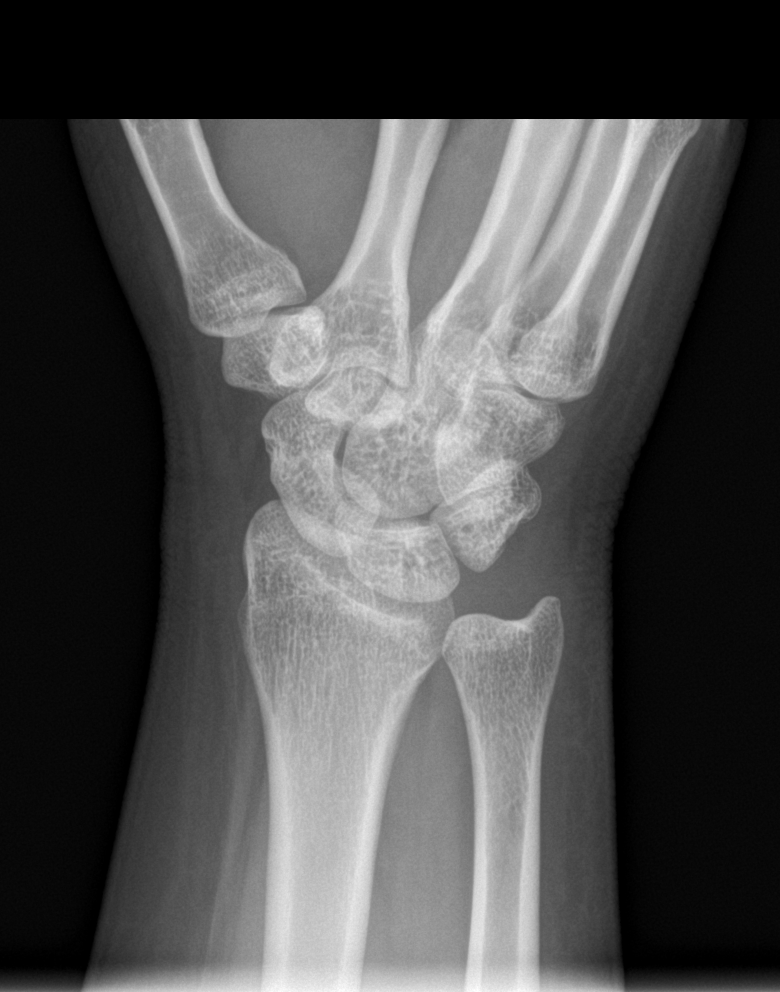

[wrist lat]
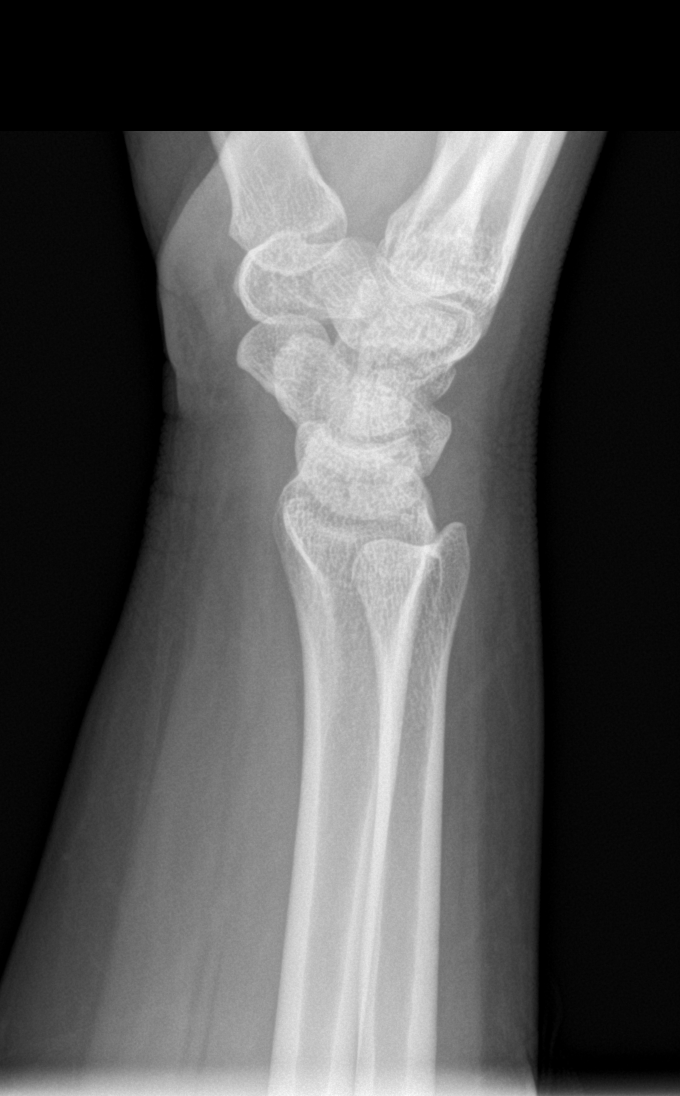

[wrist navicular]
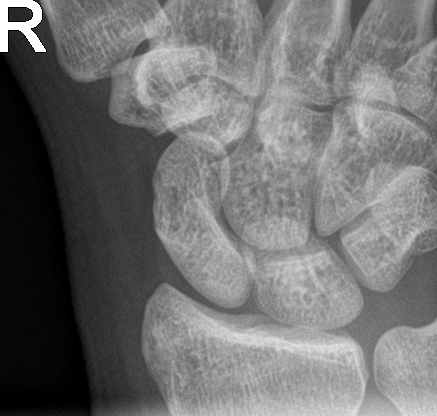

[4 of 4 positions shown; findings below may reference images not displayed]

FINDINGS: There is no evidence of fracture or dislocation. There is no
evidence of arthropathy or other focal bone abnormality. Soft
tissues are unremarkable.
IMPRESSION: Negative.

## 2020-11-30 IMAGING — CT CT ANGIO CHEST
2 of 6 series · 19 of 46 positions shown · IV contrast (Omnipaque or Isovue)
Comparison: October 17, 2008

CLINICAL DATA: Cough and chest pain.

EXAM:
CT ANGIOGRAPHY CHEST WITH CONTRAST
TECHNIQUE: Multidetector CT imaging of the chest was performed using the
standard protocol during bolus administration of intravenous
contrast. Multiplanar CT image reconstructions and MIPs were
obtained to evaluate the vascular anatomy.
CONTRAST:  100mL OMNIPAQUE IOHEXOL 350 MG/ML SOLN

[Series 5: pe axial thins · axial · 0.58mm/px · z∈[+1249,+1491]mm · 16 of 266 slices shown]
[im 12/266  lung]
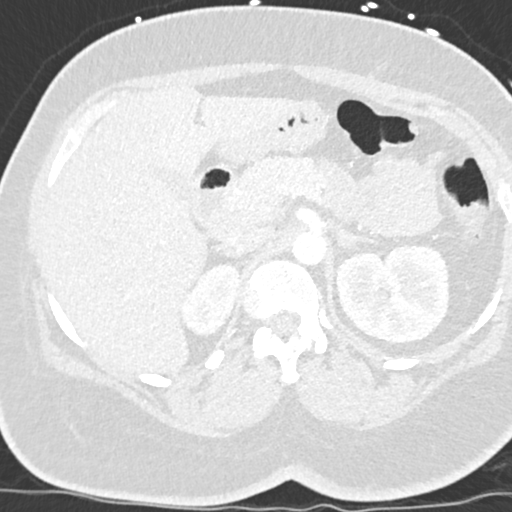
[im 35/266  soft-tissue]
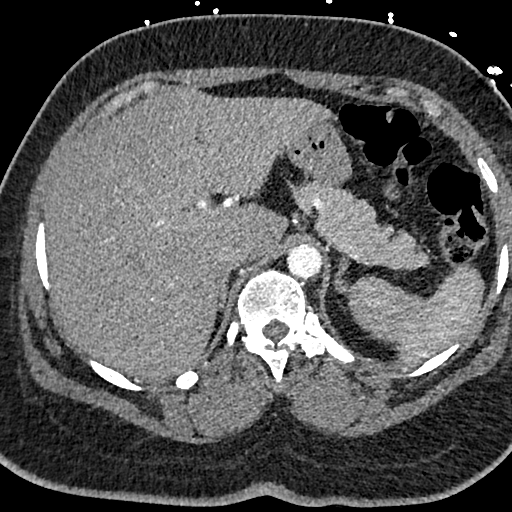
[im 47/266  lung]
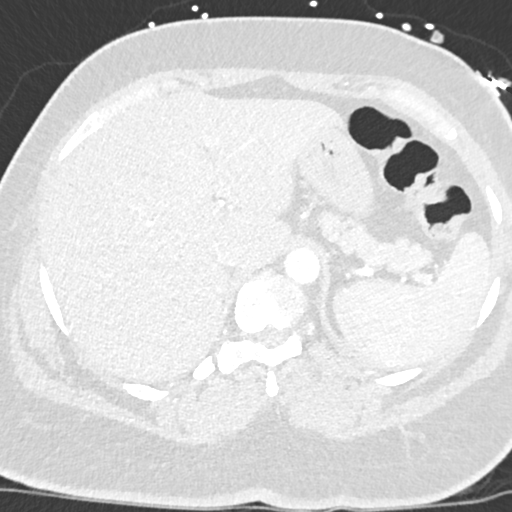
[im 58/266  soft-tissue]
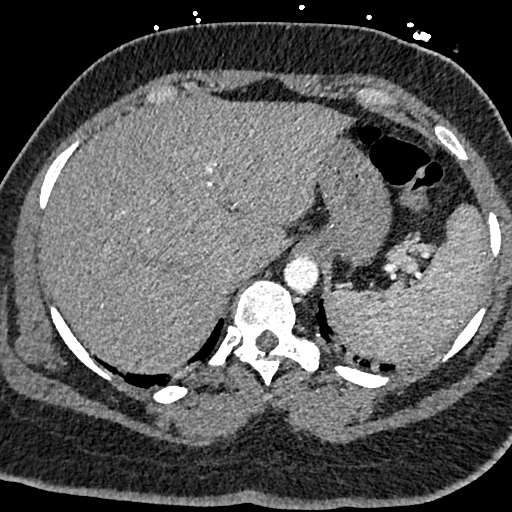
[im 81/266  lung]
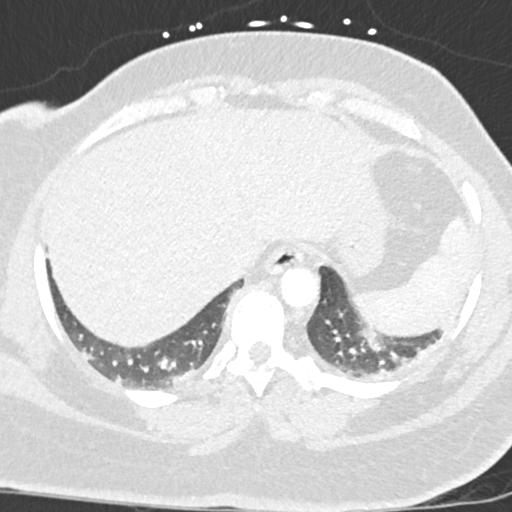
[im 93/266  soft-tissue]
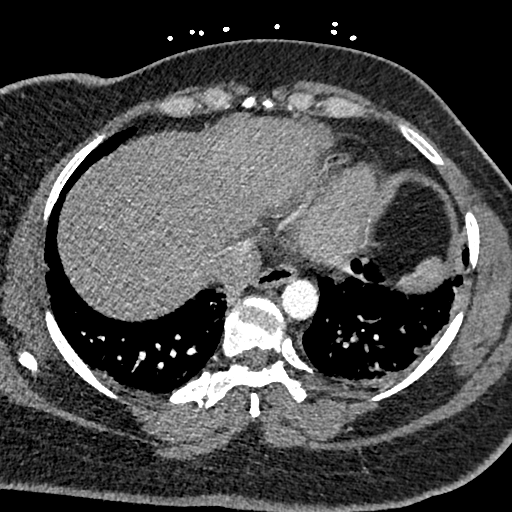
[im 104/266  lung]
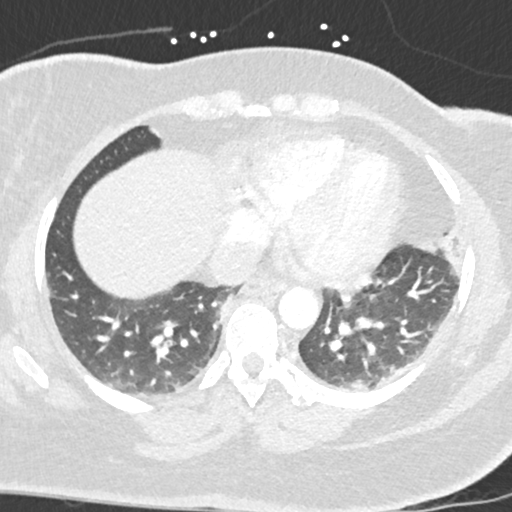
[im 127/266  soft-tissue]
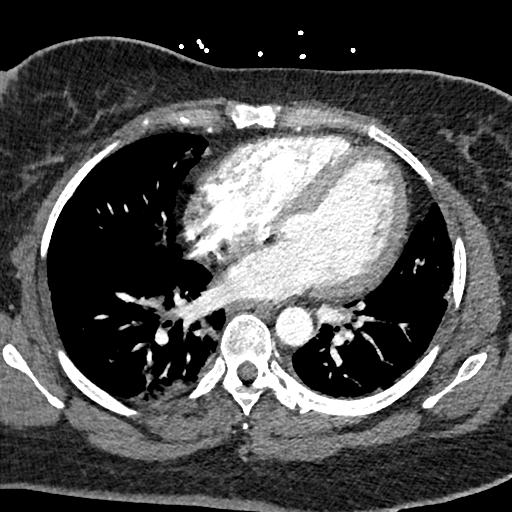
[im 139/266  lung]
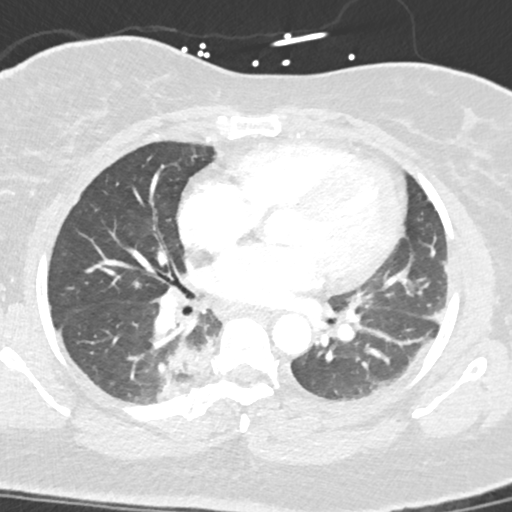
[im 162/266  soft-tissue]
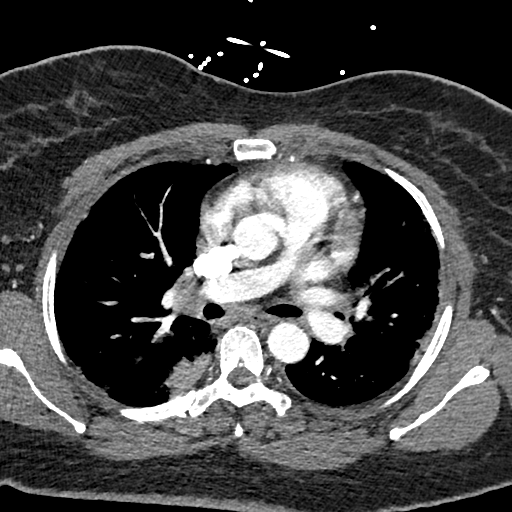
[im 173/266  lung]
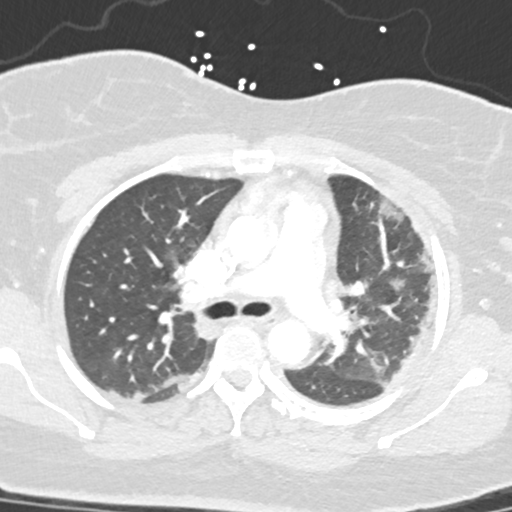
[im 185/266  soft-tissue]
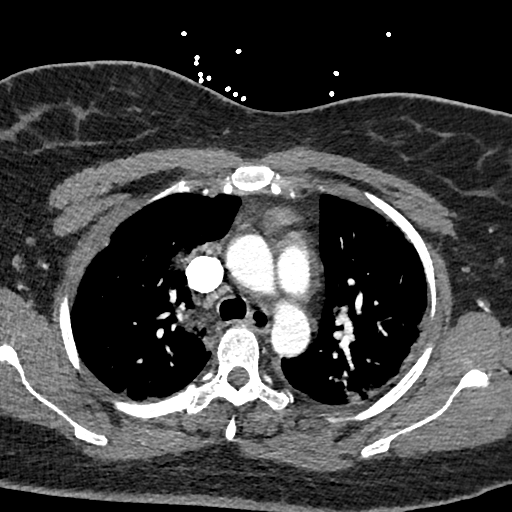
[im 208/266  lung]
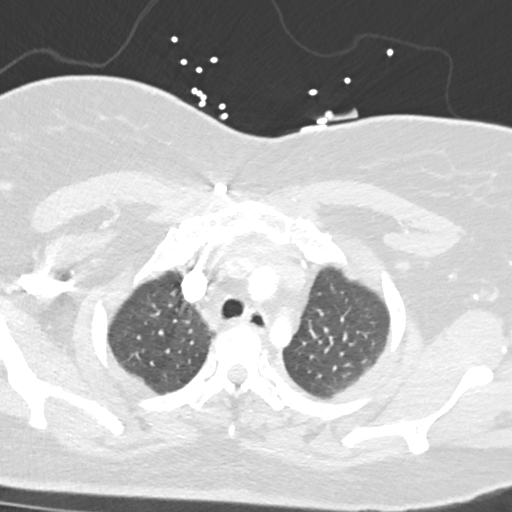
[im 219/266  soft-tissue]
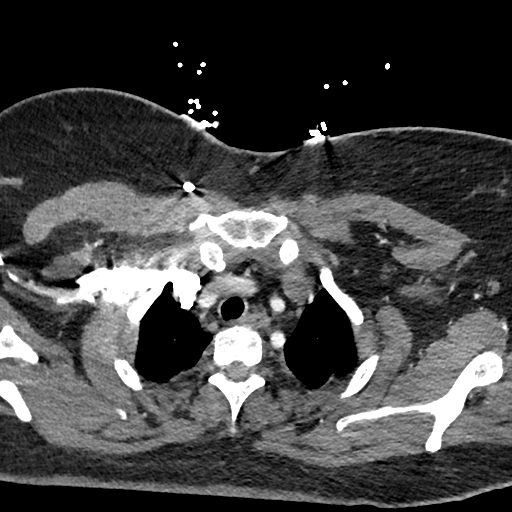
[im 231/266  lung]
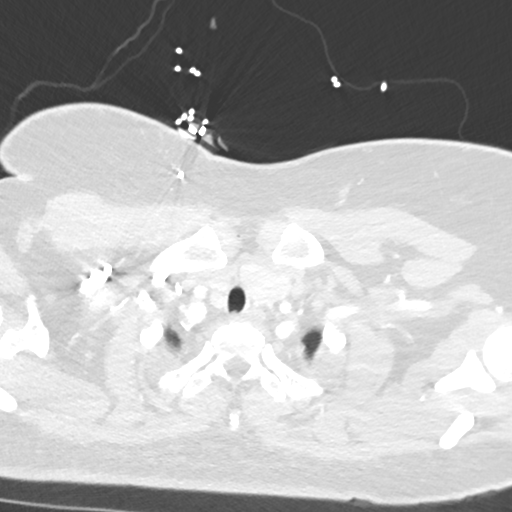
[im 254/266  soft-tissue]
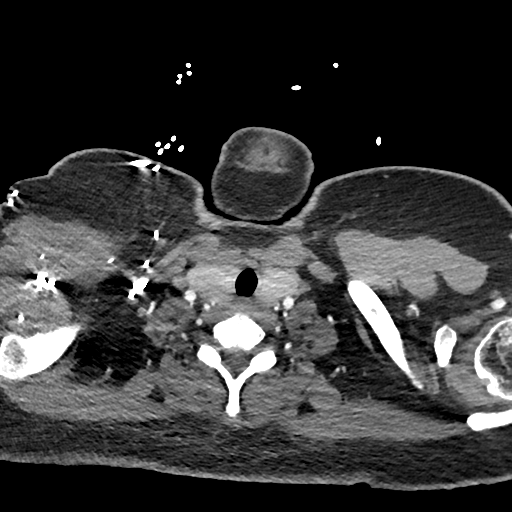

[Series 7: cor soft · coronal · 0.53mm/px · 3 of 129 slices shown]
[im 33/129  soft-tissue]
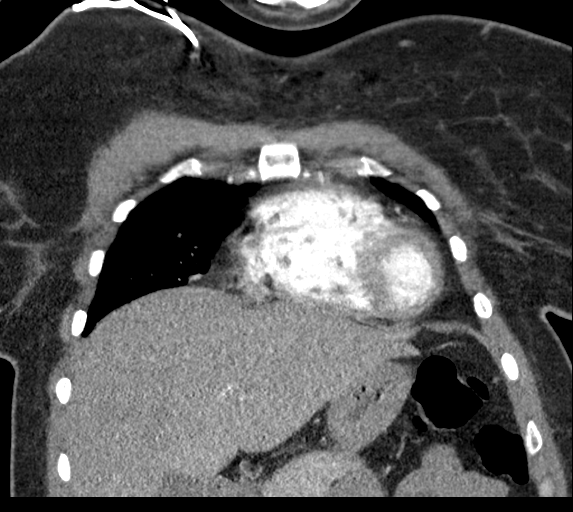
[im 65/129  soft-tissue]
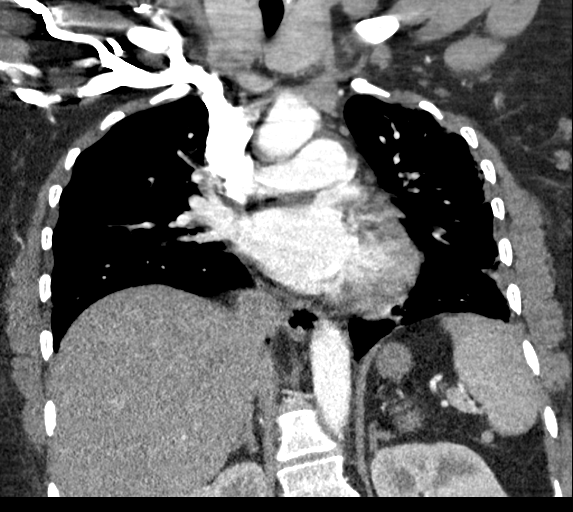
[im 97/129  soft-tissue]
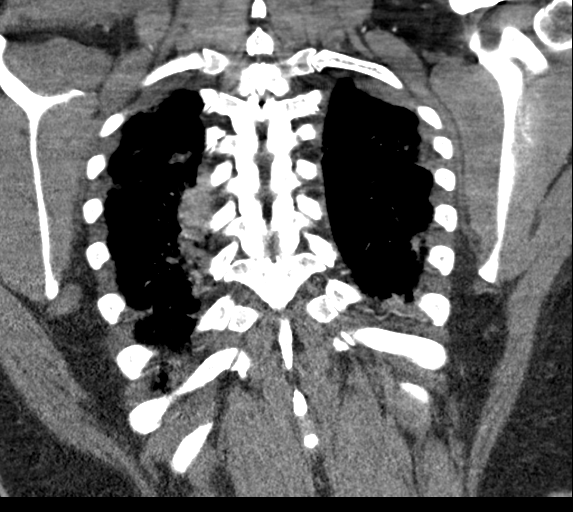

[19 of 46 positions shown; findings below may reference images not displayed]

FINDINGS: Cardiovascular: Satisfactory opacification of the pulmonary arteries
to the segmental level. No evidence of pulmonary embolism. Normal
heart size. No pericardial effusion.

Mediastinum/Nodes: Very mild right hilar lymphadenopathy is seen.

Lungs/Pleura: Mild patchy infiltrates are seen along the periphery
of the bilateral upper lobes. Mild posteromedial right lower lobe
infiltrate is also noted.

There is no evidence of a pleural effusion or pneumothorax.

Upper Abdomen: No acute abnormality.

Musculoskeletal: No chest wall abnormality. No acute or significant
osseous findings.

Review of the MIP images confirms the above findings.
IMPRESSION: 1. No evidence of acute pulmonary embolism.
2. Mild patchy infiltrates along the periphery of the bilateral
upper lobes and in the posteromedial right lower lobe.
3. Very mild right hilar lymphadenopathy, likely reactive.

## 2020-11-30 IMAGING — CR DG CHEST 1V PORT
1 series · 1 of 1 positions shown · non-contrast
Comparison: Chest radiograph 03/18/2010

CLINICAL DATA: Cough, chest pain.

EXAM:
PORTABLE CHEST 1 VIEW

[portable]
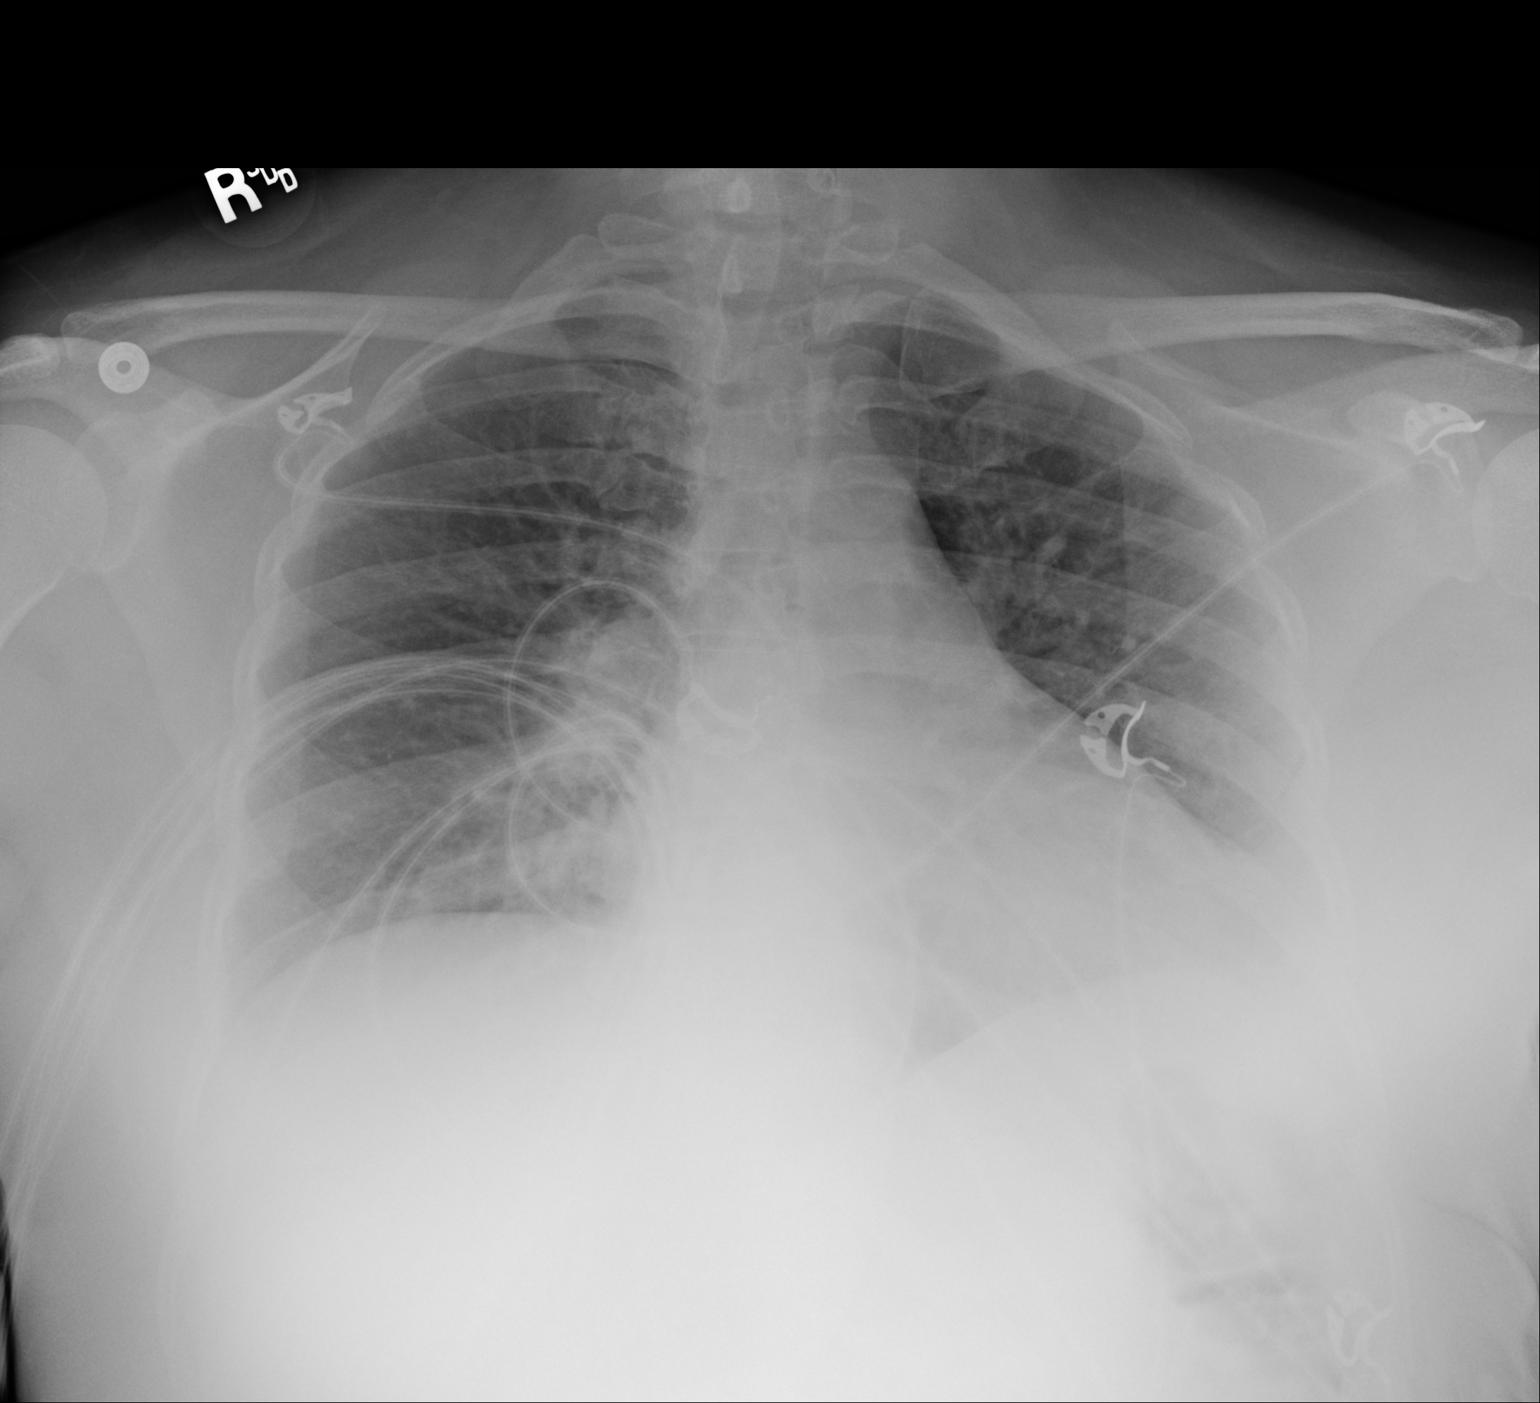

[1 of 1 positions shown; findings below may reference images not displayed]

FINDINGS: Shallow inspiration radiograph with accentuation of the cardiac
silhouette and crowding of the bronchovascular markings. Heart size
at the upper limits of normal. Ill-defined opacity within the medial
right lung base most consistent with atelectasis. The lungs are
otherwise clear. No sizable pleural effusion or evidence of
pneumothorax. No acute bony abnormality. Overlying cardiac
monitoring leads.
IMPRESSION: Shallow inspiration radiograph. Opacity within the medial right lung
base most consistent with atelectasis. Lungs otherwise clear.

Heart size at the upper limits of normal.

## 2022-02-22 ENCOUNTER — Encounter: Payer: Self-pay | Admitting: Emergency Medicine

## 2022-02-22 ENCOUNTER — Ambulatory Visit
Admission: EM | Admit: 2022-02-22 | Discharge: 2022-02-22 | Disposition: A | Discharge status: Home / Self Care | Payer: Self-pay | Attending: Nurse Practitioner | Admitting: Nurse Practitioner

## 2022-02-22 DIAGNOSIS — S0501XA Injury of conjunctiva and corneal abrasion without foreign body, right eye, initial encounter: Secondary | ICD-10-CM

## 2022-02-22 MED ORDER — POLYMYXIN B-TRIMETHOPRIM 10000-0.1 UNIT/ML-% OP SOLN: 1.0000 [drp] | Freq: Four times a day (QID) | OPHTHALMIC | 0 refills | Status: AC

## 2022-02-22 NOTE — ED Provider Notes (Signed)
[facility]    CSN: [id number] Arrival date & time: 02/22/22  0800      History   Chief Complaint No chief complaint on file.   HPI Caitlyn [name] is a 41 y.o. female.   The history is provided by the patient.   Patient presents for complaints of right eye pain that started 1 day ago.  Patient states early yesterday morning, she took her kids to school, she felt itching in the right eye.  She states that she scratched the eye, went on, and initially felt better.  She states subsequently thereafter, she developed itching again.  She states after she rubbed the eye the second time, the eye became painful and now she has sensitivity to light.  She states that her vision has not changed, but that she has difficulty opening the eye.  Pain is located in the outer aspect of the right eye.  He also complains of pressure to the outer aspect of the right eye.  She denies fever, chills, change in vision, loss of vision, purulent drainage, or other concerns.  Patient states she does not wear eyeglasses or contacts.  She has been using Visine because the eye also feels "dry".  Past Medical History:  Diagnosis Date   Anemia    Diabetes mellitus without complication (HCC)    GM - Diet controlled - no meds   Hypertension    PIH with pregnancy   Sleep apnea    does not use CPAP - lost weight   SVD (spontaneous vaginal delivery)    x 2    Patient Active Problem List   Diagnosis Date Noted   Status post cesarean delivery 02/25/2014   Fetal macrosomia in pregnancy in third trimester 02/20/2014   Hypertension in pregnancy, pre-existing, antepartum 10/14/2013   Diabetes mellitus (Class B), antepartum 09/10/2013   Hx of preeclampsia, prior pregnancy, currently pregnant 08/21/2013   Previous cesarean section complicating pregnancy 08/21/2013   Hypertension 08/21/2013   Obesity complicating pregnancy in first trimester 08/21/2013   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 Authorizing Provider  trimethoprim-polymyxin b (POLYTRIM) ophthalmic solution Place 1 drop into the right eye every 6 (six) hours for 7 days. 02/22/22 03/01/22 Yes Baylynn Shifflett-Warren, Sadie Haber, NP  azithromycin (ZITHROMAX) 250 MG tablet Take 1 tablet (250 mg total) by mouth daily. Take first 2 tablets together, then 1 every day until finished. 07/02/19   Raeford Razor, MD  Prenatal Vit-Fe Fumarate-FA (PRENATAL MULTIVITAMIN) TABS tablet Take 1 tablet by mouth daily at 12 noon.    [provider]    Family History History reviewed.  No pertinent family history.  Social History Social History   Tobacco Use   Smoking status: Former    Packs/day: 0.25    Years: 10.00    Total pack years: 2.50    Types: Cigarettes    Quit date: 06/13/2008    Years since quitting: 13.7   Smokeless tobacco: Never  Substance Use Topics   Alcohol use: Yes   Drug use: No     Allergies   Codeine   Review of Systems Review of Systems Per HPI  Physical Exam Triage Vital Signs ED Triage Vitals  Enc Vitals Group     BP 02/22/22 0808 124/88     Pulse Rate 02/22/22 0808 93     Resp 02/22/22 0808 16     Temp 02/22/22 0808 98 F (36.7 C)     Temp Source 02/22/22 0808 Oral     SpO2 02/22/22 0808 97 %     Weight --      Height --      Head Circumference --      Peak Flow --      Pain Score 02/22/22 0811 10     Pain Loc --       Pain Edu? --      Excl. in GC? --    No data found.  Updated Vital Signs BP 124/88 (BP Location: Right Arm)   Pulse 93   Temp 98 F (36.7 C) (Oral)   Resp 16   LMP 02/14/2022 (Approximate)   SpO2 97%   Visual Acuity Right Eye Distance:   Left Eye Distance:   Bilateral Distance:    Right Eye Near:   Left Eye Near:    Bilateral Near:     Physical Exam Vitals and nursing note reviewed.  Constitutional:      Appearance: Normal appearance.  HENT:     Head: Normocephalic.  Eyes:     General: Lids are normal. Vision grossly intact.        Right eye: Discharge (Watery) present. No foreign body or hordeolum.     Extraocular Movements: Extraocular movements intact.     Right eye: Normal extraocular motion and no nystagmus.     Conjunctiva/sclera:     Right eye: Right conjunctiva is injected.     Pupils: Pupils are equal, round, and reactive to light.     Comments: Eye Exam: Eyelids everted and swept for foreign body. The eye was anesthetized with 2 drops of Tetracaine and stained with fluorescein. Examination under woods lamp does reveal a foreign body or area of increased stain uptake. Uptake seen at the 9 o'clock position of the right eye.  The eye was then irrigated copiously with saline.   Pulmonary:     Effort: Pulmonary effort is normal.  Musculoskeletal:     Cervical back: Normal range of motion.  Neurological:     General: No focal deficit present.     Mental Status: She is alert and oriented to person, place, and time.  Psychiatric:        Mood and Affect: Mood normal.        Behavior: Behavior normal.      UC Treatments / Results  Labs (all labs ordered are listed, but only abnormal results are displayed) Labs Reviewed - No data to display  EKG   Radiology No results found.  Procedures Procedures (including critical care time)  Medications Ordered in UC Medications - No data to display  Initial Impression / Assessment and  Plan / UC Course  I have  reviewed the triage vital signs and the nursing notes.  Pertinent labs & imaging results that were available during my care of the patient were reviewed by me and considered in my medical decision making (see chart for details).  Patient presents for complaints of right eye pain and irritation that started 1 day ago.  On exam, there were no obvious foreign bodies present.  Fluorescein stain was performed which showed uptake at the 9 o'clock position of the right eye.  Symptoms are consistent with corneal abrasion at this time.  Patient was started on Polytrim eyedrops.  Supportive care recommendations were provided to the patient with strict indications of when to go to the emergency department or follow-up with ophthalmology.  Patient verbalizes understanding.  All questions were answered. Final Clinical Impressions(s) / UC Diagnoses   Final diagnoses:  Abrasion of right cornea, initial encounter     Discharge Instructions      Use eyedrops as prescribed.   Cool compresses to the eyes to help with pain or swelling. May continue the over-the-counter eyedrops you are using to help keep the eyes moist and decreased redness. Strict handwashing when applying medication.  Avoid rubbing or manipulating the eyes while symptoms persist. Recommend using eyeglasses until light sensitivity improves. As discussed, if you experience any sudden change in vision, loss of vision, or other concerns, please go to the emergency department or follow-up with an ophthalmologist immediately.       ED Prescriptions     Medication Sig Dispense Auth. Provider   trimethoprim-polymyxin b (POLYTRIM) ophthalmic solution Place 1 drop into the right eye every 6 (six) hours for 7 days. 10 mL Caty Tessler-Warren, Sadie Haber, NP      PDMP not reviewed this encounter.   Abran Cantor, NP 02/22/22 [phone]

## 2022-02-22 NOTE — Discharge Instructions (Signed)
Use eyedrops as prescribed.   Cool compresses to the eyes to help with pain or swelling. May continue the over-the-counter eyedrops you are using to help keep the eyes moist and decreased redness. Strict handwashing when applying medication.  Avoid rubbing or manipulating the eyes while symptoms persist. Recommend using eyeglasses until light sensitivity improves. As discussed, if you experience any sudden change in vision, loss of vision, or other concerns, please go to the emergency department or follow-up with an ophthalmologist immediately.

## 2022-02-22 NOTE — ED Triage Notes (Signed)
Right eye pain since yesterday.  States eye was itching and she rubbed her eye.  After that she went outside and eye was sensitive to light.  Pressure to side of eye.  States vision is good.

## 2022-08-11 ENCOUNTER — Encounter: Payer: Self-pay | Admitting: Radiology

## 2022-09-27 ENCOUNTER — Emergency Department (HOSPITAL_COMMUNITY)
Admission: EM | Admit: 2022-09-27 | Discharge: 2022-09-27 | Disposition: A | Discharge status: Home / Self Care | Payer: Self-pay | Attending: Emergency Medicine | Admitting: Emergency Medicine

## 2022-09-27 ENCOUNTER — Other Ambulatory Visit: Payer: Self-pay

## 2022-09-27 DIAGNOSIS — S39012A Strain of muscle, fascia and tendon of lower back, initial encounter: Secondary | ICD-10-CM | POA: Insufficient documentation

## 2022-09-27 DIAGNOSIS — Y9301 Activity, walking, marching and hiking: Secondary | ICD-10-CM | POA: Insufficient documentation

## 2022-09-27 DIAGNOSIS — R030 Elevated blood-pressure reading, without diagnosis of hypertension: Secondary | ICD-10-CM | POA: Insufficient documentation

## 2022-09-27 DIAGNOSIS — E119 Type 2 diabetes mellitus without complications: Secondary | ICD-10-CM | POA: Insufficient documentation

## 2022-09-27 DIAGNOSIS — X58XXXA Exposure to other specified factors, initial encounter: Secondary | ICD-10-CM | POA: Insufficient documentation

## 2022-09-27 DIAGNOSIS — Z87891 Personal history of nicotine dependence: Secondary | ICD-10-CM | POA: Insufficient documentation

## 2022-09-27 DIAGNOSIS — R202 Paresthesia of skin: Secondary | ICD-10-CM | POA: Insufficient documentation

## 2022-09-27 DIAGNOSIS — M25851 Other specified joint disorders, right hip: Secondary | ICD-10-CM | POA: Insufficient documentation

## 2022-09-27 DIAGNOSIS — Y92000 Kitchen of unspecified non-institutional (private) residence as  the place of occurrence of the external cause: Secondary | ICD-10-CM | POA: Insufficient documentation

## 2022-09-27 MED ORDER — OMEPRAZOLE 20 MG PO CPDR: 20.0000 mg | DELAYED_RELEASE_CAPSULE | Freq: Every day | ORAL | 0 refills | Status: DC

## 2022-09-27 MED ORDER — KETOROLAC TROMETHAMINE 15 MG/ML IJ SOLN
15.0000 mg | Freq: Once | INTRAMUSCULAR | Status: AC
  Administered 2022-09-27: 15 mg via INTRAMUSCULAR
  Filled 2022-09-27: qty 1

## 2022-09-27 MED ORDER — DICLOFENAC SODIUM 50 MG PO TBEC: 50.0000 mg | DELAYED_RELEASE_TABLET | Freq: Two times a day (BID) | ORAL | 0 refills | Status: DC

## 2022-09-27 MED ORDER — METHYLPREDNISOLONE SODIUM SUCC 125 MG IJ SOLR
125.0000 mg | Freq: Once | INTRAMUSCULAR | Status: AC
  Administered 2022-09-27: 125 mg via INTRAMUSCULAR
  Filled 2022-09-27: qty 2

## 2022-09-27 MED ORDER — PREDNISONE 10 MG (21) PO TBPK: ORAL_TABLET | Freq: Every day | ORAL | 0 refills | Status: DC

## 2022-09-27 MED ORDER — METHOCARBAMOL 500 MG PO TABS: 500.0000 mg | ORAL_TABLET | Freq: Two times a day (BID) | ORAL | 0 refills | Status: DC

## 2022-09-27 NOTE — ED Triage Notes (Signed)
Patient arrives for eval of ongoing RLE and R lower back pain, ongoing for years, but worse in the last few months. Patient ambulatory but with limp. States has had MRI and been to a hip specialist within the last few months who did not see anything wrong. Endorses some swelling on the R inner thigh and hip. 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 Also adds that for two months she's had pain in her RUE, specifically her thumb and forearm, with any lifting and sometimes she has trouble holding onto objects like her phone.

## 2022-09-27 NOTE — ED Provider Notes (Signed)
[facility] AT [facility] Provider Note   CSN: [id number] Arrival date & time: 09/27/22  0847     History  Chief Complaint  Patient presents with   Leg Pain   Back Pain    Caitlyn [name] is a 42 y.o. female.  42 year old female presents with complaint of worsening pain in her right hip/groin, lower back, right knee and right hand numbness. Patient states symptoms first started in 2018 when Caitlyn [name] was walking in her kitchen and noticed a sharp pain in the front of her hip/right groin area. Patient did not think much of the episode at that time and symptoms gradually improved. Caitlyn [name] went on to develop worsening pain in the groin area in 2019, progressing to pain in her right lower back that then moves down her right anterior thigh to her knee and down to her foot. Later developed pain in her right knee and feels like the whole leg gives out at times. Recently has noted numbness/tingling in her right 3rd and 4th fingers as well as thumb which limits her ability to hold and operate her phone with this hand, has to shake and flex/extend fingers to try and improve the numb sensation. Patient is left hand dominant although uses right hand for some tasks/ambidextrous to an extent. Had similar symptoms in her left hand several years ago where her fingers would contract and Caitlyn [name] would have to run hot water over them to improve symptoms, seen by Dr. Tiburcio Pea in [place] (ortho), had injection in her thumb and left hand symptoms resolved. Patient went to ortho [facility] and [place]) previously, had lumbar spine Xray and MRI hip without contrast, told everything was normal, mild degenerative changes. Then seen by [facility], had XR of knee and told had limited joint space in the knee.  Patient is having trouble sleeping, tries to lie on stomach which is comfortable for a limited time, then roll to left side which is also limited, unable to lie on right side due to arm and right  leg pain. Pain limits ability to climb stairs to her apartment, limits ability to play with her kids, limits ability to work (works for a [employer]). No PCP. No relief with OTC meds.  Denies abdominal pain, saddle paresthesia, loss of bowel or bladder control. No history of fevers, no history of IVDA.        Home Medications Prior to Admission medications   Medication Sig Start Date End Date Taking? Authorizing Provider  diclofenac (VOLTAREN) 50 MG EC tablet Take 1 tablet (50 mg total) by mouth 2 (two) times daily. 09/27/22  Yes Jeannie Fend, PA-C  methocarbamol (ROBAXIN) 500 MG tablet Take 1 tablet (500 mg total) by mouth 2 (two) times daily. 09/27/22  Yes Jeannie Fend, PA-C  omeprazole (PRILOSEC) 20 MG capsule Take 1 capsule (20 mg total) by mouth daily. 09/27/22  Yes Jeannie Fend, PA-C  predniSONE (STERAPRED UNI-PAK 21 TAB) 10 MG (21) TBPK tablet Take by mouth daily. Take 6 tabs by mouth daily  for 2 days, then 5 tabs for 2 days, then 4 tabs for 2 days, then 3 tabs for 2 days, 2 tabs for 2 days, then 1 tab by mouth daily for 2 days 09/27/22  Yes Jeannie Fend, PA-C  Prenatal Vit-Fe Fumarate-FA (PRENATAL MULTIVITAMIN) TABS tablet Take 1 tablet by mouth daily at 12 noon.    [provider]      Allergies  Codeine    Review of Systems   Review of Systems Negative except as per HPI Physical Exam Updated Vital Signs BP (!) 176/118 (BP Location: Right Arm)   Pulse 97   Temp 98.7 F (37.1 C)   Resp 16   LMP 09/26/2022 (Exact Date)   SpO2 100%  Physical Exam Vitals and nursing note reviewed.  Constitutional:      General: Caitlyn [name] is not in acute distress.    Appearance: Caitlyn [name] is well-developed. Caitlyn [name] is not diaphoretic.  HENT:     Head: Normocephalic and atraumatic.  Cardiovascular:     Pulses: Normal pulses.  Pulmonary:     Effort: Pulmonary effort is normal.  Abdominal:     Palpations: Abdomen is soft.     Tenderness: There is no abdominal tenderness.   Musculoskeletal:        General: Tenderness present. No swelling or deformity.       Back:     Right lower leg: No edema.     Left lower leg: No edema.     Comments: Bilateral SI joint tenderness  No pain with log roll hips. Pain in right lower back with right straight leg raise as well as with external rotation of hip and adduction. Upper and lower ext strength equal, reflexes intact, sensation intact.  TTP right groin and medial proximal thigh without obvious skin changes somewhat limited by habitus in regards to limiting ability to appreciate swelling   Skin:    General: Skin is warm and dry.     Findings: No bruising, erythema or rash.  Neurological:     Mental Status: Caitlyn [name] is alert and oriented to person, place, and time.     Sensory: No sensory deficit.     Deep Tendon Reflexes: Reflexes normal.  Psychiatric:        Behavior: Behavior normal.     ED Results / Procedures / Treatments   Labs (all labs ordered are listed, but only abnormal results are displayed) Labs Reviewed - No data to display  EKG None  Radiology No results found.  Procedures Procedures    Medications Ordered in ED Medications  methylPREDNISolone sodium succinate (SOLU-MEDROL) 125 mg/2 mL injection 125 mg (125 mg Intramuscular Given 09/27/22 1048)  ketorolac (TORADOL) 15 MG/ML injection 15 mg (15 mg Intramuscular Given 09/27/22 1048)    ED Course/ Medical Decision Making/ A&P                             Medical Decision Making Risk Prescription drug management.   This patient presents to the ED for concern of right hip pain with pain in lower back, right hand, this involves an extensive number of treatment options, and is a complaint that carries with it a high risk of complications and morbidity.  The differential diagnosis includes but not limited to hip impingement, sciatica, bursitis, degenerative disc disease, lumbar radiculopathy, ulnar nerve compression, carpal tunnel syndrome,  MS   Co morbidities that complicate the patient evaluation  DM, gestational HTN, former smoker   Additional history obtained:  Additional history obtained from spouse at bedside who contributes to history as above External records from outside source obtained and reviewed including XR right hip dated 01/11/22; 02/10/22 right hip MRI without contrast showing:  Labrum  No appreciable labral tear on this non-arthrographic examination. Mild degenerative changes of the superior labrum.    Problem List / ED Course / Critical interventions / Medication management  42 year old female presents with complaint as above.  Found to have pain in the anterior right thigh/groin area as well as bilateral SI joints.  No overlying skin changes, reflexes intact, strength symmetric in upper and lower extremities.  Chart reviewed including prior Ortho note and imaging on file.  Discussed with patient plan for anti-inflammatory/muscle relaxant prescription to help with her symptoms although needs further workup with orthopedics.  Patient is frustrated with her prior orthopedic evaluations feeling like pain out-of-pocket for cost has not resulted in a diagnosis and her pain is only worsening.  Encouraged patient to return to Ortho for next steps in her workup to get to the root cause of her symptoms.  Also referred to [facility] as well as [facility] to establish primary care for follow-up of her elevated blood pressure, possible assistance with physical therapy and referral to specialty care and imaging if needed. I ordered medication including Toradol and Solu-Medrol for pain Reevaluation of the patient after these medicines showed that the patient improved I have reviewed the patients home medicines and have made adjustments as needed   Social Determinants of Health:  No pcp, provided with referral to    Test / Admission - Considered:  Imaging deferred to outpatient follow  up         Final Clinical Impression(s) / ED Diagnoses Final diagnoses:  Right hip impingement syndrome  Strain of lumbar region, initial encounter  Right hand paresthesia  Elevated blood pressure reading    Rx / DC Orders ED Discharge Orders          Ordered    predniSONE (STERAPRED UNI-PAK 21 TAB) 10 MG (21) TBPK tablet  Daily        09/27/22 1044    omeprazole (PRILOSEC) 20 MG capsule  Daily        09/27/22 1044    diclofenac (VOLTAREN) 50 MG EC tablet  2 times daily        09/27/22 1044    methocarbamol (ROBAXIN) 500 MG tablet  2 times daily        09/27/22 1044              Alden Hipp 09/27/22 1658    Jacalyn Lefevre, MD 09/28/22 1507

## 2022-09-27 NOTE — Discharge Instructions (Addendum)
Follow up with primary care resources provided. Hopefully, this will help with access to orthopedic care and imaging and/or physical therapy. Your blood pressure is elevated today, please monitor and record readings, take with you to a primary care follow up.  Take medications as prescribed. Do not drive or operate machinery while taking muscle relaxors.   Omeprazole prescribed to prevent developing a stomach ulcer from NSAID (pain) medications

## 2022-10-03 ENCOUNTER — Ambulatory Visit: Payer: Self-pay

## 2022-10-03 NOTE — Telephone Encounter (Signed)
Chief Complaint: Swelling right upper thigh near hip/groin Symptoms: Pain 9/10 when walk Frequency: 2 months ongoing Pertinent Negatives: Patient denies other symptoms Disposition: ED /[] Urgent Care (no appt availability in office) / Appointment(In office/virtual)/  [facility]/ Home Care/ Refused Recommended Disposition /[] [facility]/  Follow-up with PCP Additional Notes: patient called to schedule a NP appointment per the ED provider on 09/27/22, was given 2 numbers to call to schedule the soonest. She was scheduled for 12/01/22 at [facility] after calling one of the numbers given. She called the 2nd number given and the agent transferred to NT due to swelling and mentioned a HFU visit. Advised to send to [facility] a CRM that HFU is needed. I gave the patient numbers to other community clinics to call to see if she could get a sooner appointment for HFU and to establish care, advised ED if symptoms are not improving before she could be established. She verbalized understanding.   Reason for Disposition  [1] MODERATE leg swelling (e.g., swelling extends up to knees) AND [2] new-onset or worsening  Answer Assessment - Initial Assessment Questions 1. ONSET: "When did the swelling start?" (e.g., minutes, hours, days)     2 months ago 2. LOCATION: "What part of the leg is swollen?"  "Are both legs swollen or just one leg?"     Right upper thigh near the hip 3. SEVERITY: "How bad is the swelling?" (e.g., localized; mild, moderate, severe)   - Localized: Small area of swelling localized to one leg.   - MILD pedal edema: Swelling limited to foot and ankle, pitting edema < 1/4 inch (6 mm) deep, rest and elevation eliminate most or all swelling.   - MODERATE edema: Swelling of lower leg to knee, pitting edema > 1/4 inch (6 mm) deep, rest and elevation only partially reduce swelling.   - SEVERE edema: Swelling extends above knee, facial or hand swelling present.      Moderate to  severe, localized 4. REDNESS: "Does the swelling look red or infected?"     No 5. PAIN: "Is the swelling painful to touch?" If Yes, ask: "How painful is it?"   (Scale 1-10; mild, moderate or severe)     9 6. FEVER: "Do you have a fever?" If Yes, ask: "What is it, how was it measured, and when did it start?"      N/A 7. CAUSE: "What do you think is causing the leg swelling?"     I don't know 8. MEDICAL HISTORY: "Do you have a history of blood clots (e.g., DVT), cancer, heart failure, kidney disease, or liver failure?"     No 9. RECURRENT SYMPTOM: "Have you had leg swelling before?" If Yes, ask: "When was the last time?" "What happened that time?"     No 10. OTHER SYMPTOMS: "Do you have any other symptoms?" (e.g., chest pain, difficulty breathing)      No  Protocols used: Leg Swelling and Edema-A-AH

## 2022-10-14 ENCOUNTER — Ambulatory Visit (HOSPITAL_COMMUNITY)
Admission: RE | Admit: 2022-10-14 | Discharge: 2022-10-14 | Disposition: A | Discharge status: Home / Self Care | Payer: Self-pay | Source: Ambulatory Visit | Attending: Nurse Practitioner | Admitting: Nurse Practitioner

## 2022-10-14 ENCOUNTER — Ambulatory Visit (INDEPENDENT_AMBULATORY_CARE_PROVIDER_SITE_OTHER): Payer: Self-pay | Admitting: Nurse Practitioner

## 2022-10-14 VITALS — BP 132/77 | HR 99 | Temp 97.5°F | Ht 64.0 in | Wt 216.0 lb

## 2022-10-14 DIAGNOSIS — E119 Type 2 diabetes mellitus without complications: Secondary | ICD-10-CM

## 2022-10-14 DIAGNOSIS — M7989 Other specified soft tissue disorders: Secondary | ICD-10-CM

## 2022-10-14 DIAGNOSIS — M25551 Pain in right hip: Secondary | ICD-10-CM | POA: Insufficient documentation

## 2022-10-14 DIAGNOSIS — Z131 Encounter for screening for diabetes mellitus: Secondary | ICD-10-CM

## 2022-10-14 MED ORDER — METFORMIN HCL ER 500 MG PO TB24: 500.0000 mg | ORAL_TABLET | Freq: Two times a day (BID) | ORAL | 2 refills | Status: DC

## 2022-10-14 NOTE — Patient Instructions (Signed)
1. Right hip pain  - Ambulatory referral to Orthopedics - DG Hip Unilat W OR W/O Pelvis 2-3 Views Right  2. Diabetes mellitus screening  - POCT glycosylated hemoglobin (Hb A1C) - Microalbumin/Creatinine Ratio, Urine  3. Right leg swelling  - VAS [place] LOWER EXTREMITY VENOUS (DVT); Future - CBC - Comprehensive metabolic panel - DG Hip Unilat W OR W/O Pelvis 2-3 Views Right  4. Type 2 diabetes mellitus without complication, without long-term current use of insulin (HCC)  - metFORMIN (GLUCOPHAGE-XR) 500 MG 24 hr tablet; Take 1 tablet (500 mg total) by mouth 2 (two) times daily with a meal.  Dispense: 60 tablet; Refill: 2  Follow up:  Follow up in 3 months

## 2022-10-14 NOTE — Progress Notes (Unsigned)
@Patient  ID: Caitlyn [name], female    DOB: 22-Jun-1980, 42 y.o.   MRN: [id number]  Chief Complaint  Patient presents with   Establish Care    Non fasting     Referring provider: No ref. provider found   HPI  Patient presents today for right hip pain and swelling.  She states that this been an issue for over a year now.  She has been following with [facility] for this issue.  She did have an MRI recently that she states was normal.  She would like a referral to a different orthopedic specialist for second opinion.  She feels like she is having a lot of swelling to her right hip and thigh region as well as her right calf.  We discussed that we can get ultrasound today.  Patient does have past history of gestational diabetes and hypertension.  A1c in office today was 14.6.  We will start patient on metformin XR.  We discussed diet and exercise.  Denies f/c/s, n/v/d, hemoptysis, PND, leg swelling Denies chest pain or edema     Allergies  Allergen Reactions   Codeine Anaphylaxis and Swelling    Puffy eyes, pt can take Percocet    Immunization History  Administered Date(s) Administered   Tdap 12/09/2013    Past Medical History:  Diagnosis Date   Anemia    Diabetes mellitus without complication (HCC)    GM - Diet controlled - no meds   Hypertension    PIH with pregnancy   Sleep apnea    does not use CPAP - lost weight   SVD (spontaneous vaginal delivery)    x 2    Tobacco History: Social History   Tobacco Use  Smoking Status Former   Packs/day: 0.25   Years: 10.00   Additional pack years: 0.00   Total pack years: 2.50   Types: Cigarettes   Quit date: 06/13/2008   Years since quitting: 14.3  Smokeless Tobacco Never   Counseling given: Not Answered   Outpatient Encounter Medications as of 10/14/2022  Medication Sig   diclofenac (VOLTAREN) 50 MG EC tablet Take 1 tablet (50 mg total) by mouth 2 (two) times daily.   metFORMIN (GLUCOPHAGE-XR) 500 MG 24  hr tablet Take 1 tablet (500 mg total) by mouth 2 (two) times daily with a meal.   methocarbamol (ROBAXIN) 500 MG tablet Take 1 tablet (500 mg total) by mouth 2 (two) times daily.   omeprazole (PRILOSEC) 20 MG capsule Take 1 capsule (20 mg total) by mouth daily.   predniSONE (STERAPRED UNI-PAK 21 TAB) 10 MG (21) TBPK tablet Take by mouth daily. Take 6 tabs by mouth daily  for 2 days, then 5 tabs for 2 days, then 4 tabs for 2 days, then 3 tabs for 2 days, 2 tabs for 2 days, then 1 tab by mouth daily for 2 days   Prenatal Vit-Fe Fumarate-FA (PRENATAL MULTIVITAMIN) TABS tablet Take 1 tablet by mouth daily at 12 noon. (Patient not taking: Reported on 10/14/2022)   No facility-administered encounter medications on file as of 10/14/2022.     Review of Systems  Review of Systems  Constitutional: Negative.   HENT: Negative.    Cardiovascular: Negative.   Gastrointestinal: Negative.   Musculoskeletal:        Right hip pain and swelling  Allergic/Immunologic: Negative.   Neurological: Negative.   Psychiatric/Behavioral: Negative.         Physical Exam  BP 132/77   Pulse 99  Temp (!) 97.5 F (36.4 C)   Ht 5\' 4"  (1.626 m)   Wt 216 lb (98 kg)   LMP 09/26/2022 (Exact Date)   SpO2 99%   BMI 37.08 kg/m   Wt Readings from Last 5 Encounters:  10/14/22 216 lb (98 kg)  07/02/19 234 lb (106.1 kg)  06/16/19 230 lb (104.3 kg)  06/15/15 287 lb (130.2 kg)  01/15/15 283 lb (128.4 kg)     Physical Exam Vitals and nursing note reviewed.  Constitutional:      General: She is not in acute distress.    Appearance: She is well-developed.  Cardiovascular:     Rate and Rhythm: Normal rate and regular rhythm.  Pulmonary:     Effort: Pulmonary effort is normal.     Breath sounds: Normal breath sounds.  Musculoskeletal:     Right lower leg: Edema present.  Neurological:     Mental Status: She is alert and oriented to person, place, and time.      Lab Results:  CBC    Component Value  Date/Time   WBC 8.4 10/14/2022 1446   WBC 4.1 07/02/2019 1311   RBC 4.98 10/14/2022 1446   RBC 5.08 07/02/2019 1311   HGB 13.5 10/14/2022 1446   HGB 12.7 08/06/2013 0000   HCT 42.2 10/14/2022 1446   HCT 37 08/06/2013 0000   PLT 302 10/14/2022 1446   PLT 286 08/06/2013 0000   MCV 85 10/14/2022 1446   MCH 27.1 10/14/2022 1446   MCH 27.8 07/02/2019 1311   MCHC 32.0 10/14/2022 1446   MCHC 32.1 07/02/2019 1311   RDW 12.7 10/14/2022 1446   LYMPHSABS 1.6 07/02/2019 1311   MONOABS 0.4 07/02/2019 1311   EOSABS 0.0 07/02/2019 1311   BASOSABS 0.0 07/02/2019 1311    BMET    Component Value Date/Time   NA 132 (L) 10/14/2022 1446   K 4.7 10/14/2022 1446   CL 96 10/14/2022 1446   CO2 21 10/14/2022 1446   GLUCOSE 466 (H) 10/14/2022 1446   GLUCOSE 247 (H) 07/02/2019 1311   GLUCOSE 107 (H) 09/09/2013 1237   BUN 13 10/14/2022 1446   CREATININE 0.78 10/14/2022 1446   CREATININE 0.62 02/03/2014 0931   CALCIUM 10.3 (H) 10/14/2022 1446   GFRNONAA >60 07/02/2019 1311   GFRAA >60 07/02/2019 1311    BNP No results found for: "BNP"  ProBNP    Component Value Date/Time   PROBNP 335.0 (H) 03/17/2010 1201    Imaging: No results found.   Assessment & Plan:   Right hip pain - Ambulatory referral to Orthopedics - DG Hip Unilat W OR W/O Pelvis 2-3 Views Right  2. Diabetes mellitus screening  - POCT glycosylated hemoglobin (Hb A1C) - Microalbumin/Creatinine Ratio, Urine  3. Right leg swelling  - VAS [place] LOWER EXTREMITY VENOUS (DVT); Future - CBC - Comprehensive metabolic panel - DG Hip Unilat W OR W/O Pelvis 2-3 Views Right  4. Type 2 diabetes mellitus without complication, without long-term current use of insulin (HCC)  - metFORMIN (GLUCOPHAGE-XR) 500 MG 24 hr tablet; Take 1 tablet (500 mg total) by mouth 2 (two) times daily with a meal.  Dispense: 60 tablet; Refill: 2  Follow up:  Follow up in 3 months     Ivonne Andrew, NP 10/19/2022

## 2022-10-15 LAB — COMPREHENSIVE METABOLIC PANEL
ALT: 16 IU/L (ref 0–32)
AST: 12 IU/L (ref 0–40)
Albumin/Globulin Ratio: 1.5 (ref 1.2–2.2)
Albumin: 4.5 g/dL (ref 3.9–4.9)
Alkaline Phosphatase: 76 IU/L (ref 44–121)
BUN/Creatinine Ratio: 17 (ref 9–23)
BUN: 13 mg/dL (ref 6–24)
Bilirubin Total: 0.3 mg/dL (ref 0.0–1.2)
CO2: 21 mmol/L (ref 20–29)
Calcium: 10.3 mg/dL — ABNORMAL HIGH (ref 8.7–10.2)
Chloride: 96 mmol/L (ref 96–106)
Creatinine, Ser: 0.78 mg/dL (ref 0.57–1.00)
Globulin, Total: 3 g/dL (ref 1.5–4.5)
Glucose: 466 mg/dL — ABNORMAL HIGH (ref 70–99)
Potassium: 4.7 mmol/L (ref 3.5–5.2)
Sodium: 132 mmol/L — ABNORMAL LOW (ref 134–144)
Total Protein: 7.5 g/dL (ref 6.0–8.5)
eGFR: 98 mL/min/{1.73_m2} (ref >59)

## 2022-10-15 LAB — CBC
Hematocrit: 42.2 % (ref 34.0–46.6)
Hemoglobin: 13.5 g/dL (ref 11.1–15.9)
MCH: 27.1 pg (ref 26.6–33.0)
MCHC: 32 g/dL (ref 31.5–35.7)
MCV: 85 fL (ref 79–97)
Platelets: 302 10*3/uL (ref 150–450)
RBC: 4.98 x10E6/uL (ref 3.77–5.28)
RDW: 12.7 % (ref 11.7–15.4)
WBC: 8.4 10*3/uL (ref 3.4–10.8)

## 2022-10-16 LAB — MICROALBUMIN / CREATININE URINE RATIO
Creatinine, Urine: 33.3 mg/dL
Microalb/Creat Ratio: <9 mg/g creat (ref 0–29)
Microalbumin, Urine: <3 ug/mL

## 2022-10-17 NOTE — Progress Notes (Signed)
Called pt and  left a message for a called back. Gh 

## 2022-10-19 ENCOUNTER — Encounter: Payer: Self-pay | Admitting: Nurse Practitioner

## 2022-10-19 DIAGNOSIS — M25551 Pain in right hip: Secondary | ICD-10-CM | POA: Insufficient documentation

## 2022-10-19 LAB — POCT GLYCOSYLATED HEMOGLOBIN (HGB A1C): Hemoglobin A1C: 14.6 % — AB (ref 4.0–5.6)

## 2022-10-19 NOTE — Assessment & Plan Note (Signed)
-   Ambulatory referral to Orthopedics - DG Hip Unilat W OR W/O Pelvis 2-3 Views Right  2. Diabetes mellitus screening  - POCT glycosylated hemoglobin (Hb A1C) - Microalbumin/Creatinine Ratio, Urine  3. Right leg swelling  - VAS [place] LOWER EXTREMITY VENOUS (DVT); Future - CBC - Comprehensive metabolic panel - DG Hip Unilat W OR W/O Pelvis 2-3 Views Right  4. Type 2 diabetes mellitus without complication, without long-term current use of insulin (HCC)  - metFORMIN (GLUCOPHAGE-XR) 500 MG 24 hr tablet; Take 1 tablet (500 mg total) by mouth 2 (two) times daily with a meal.  Dispense: 60 tablet; Refill: 2  Follow up:  Follow up in 3 months

## 2022-10-20 ENCOUNTER — Encounter (HOSPITAL_COMMUNITY): Payer: Self-pay

## 2022-10-20 ENCOUNTER — Ambulatory Visit (HOSPITAL_COMMUNITY): Payer: Self-pay

## 2022-10-24 ENCOUNTER — Telehealth: Payer: Self-pay | Admitting: Pharmacist

## 2022-10-24 NOTE — Progress Notes (Signed)
Attempted to contact pa Left HIPAA compliant message for patient to return my call at their convenience.    Catie Eppie Gibson, PharmD, BCACP, CPP [facility] [phone]

## 2022-10-25 ENCOUNTER — Telehealth: Payer: Self-pay

## 2022-10-25 NOTE — Progress Notes (Signed)
   Care Guide Note  10/25/2022 Name: TEQUILLA DUGAN MRN: [id number] DOB: 18-Jan-1981  Referred by: Ivonne Andrew, NP Reason for referral : Care Coordination (Outreach to schedule with Pharm D ASAP )   FOUA MANTZ is a 42 y.o. year old female who is a primary care patient of Ivonne Andrew, NP. [name] was referred to the pharmacist for assistance related to DM.    An unsuccessful telephone outreach was attempted today to contact the patient who was referred to the pharmacy team for assistance with medication management. Additional attempts will be made to contact the patient.   Penne Lash, RMA Care Guide [facility]  [place], [place] [address] Direct Dial: [phone] [email or web address]

## 2022-10-27 ENCOUNTER — Ambulatory Visit (HOSPITAL_BASED_OUTPATIENT_CLINIC_OR_DEPARTMENT_OTHER): Payer: Self-pay | Admitting: Orthopaedic Surgery

## 2022-10-31 NOTE — Progress Notes (Signed)
   Care Guide Note  10/31/2022 Name: GAYNA BERGAN MRN: [id number] DOB: 1980/11/29  Referred by: Ivonne Andrew, NP Reason for referral : Care Coordination (Outreach to schedule with Pharm D ASAP )   ANQUENETTE ORMAN is a 42 y.o. year old female who is a primary care patient of Ivonne Andrew, NP. [name] was referred to the pharmacist for assistance related to DM.    A third unsuccessful telephone outreach was attempted today to contact the patient who was referred to the pharmacy team for assistance with medication management. The Population Health team is pleased to engage with this patient at any time in the future upon receipt of referral and should he/she be interested in assistance from the [facility] team.   Penne Lash, RMA Care Guide [facility]  [place], [place] [address] Direct Dial: [phone] [email or web address]

## 2022-11-09 ENCOUNTER — Ambulatory Visit (HOSPITAL_BASED_OUTPATIENT_CLINIC_OR_DEPARTMENT_OTHER): Payer: Self-pay | Admitting: Orthopaedic Surgery

## 2022-11-09 NOTE — Progress Notes (Signed)
Per provider pt was contacted. Gh

## 2022-11-11 ENCOUNTER — Ambulatory Visit: Payer: Self-pay | Admitting: Nurse Practitioner

## 2022-11-17 ENCOUNTER — Ambulatory Visit (INDEPENDENT_AMBULATORY_CARE_PROVIDER_SITE_OTHER): Payer: Self-pay | Admitting: Orthopaedic Surgery

## 2022-11-17 DIAGNOSIS — S76011S Strain of muscle, fascia and tendon of right hip, sequela: Secondary | ICD-10-CM

## 2022-11-17 NOTE — Progress Notes (Signed)
Chief Complaint: Right hip pain     History of Present Illness:    Caitlyn [name] is a 42 y.o. female presents with ongoing right hip pain for the last several years.  She does have a history of lower back pain which she has not been able to treat.  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  With regard to the right hip she states that this has been lateral based although there is some radiation to the distal thigh.  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  She has previously seen an outside surgeon for discussion with her lumbar MRI which was essentially negative.  She did have an MRI of her right hip done although she does not have the disc with her today.  She has a hard time sleeping directly on the side    Surgical History:   None  PMH/PSH/Family History/Social History/Meds/Allergies:    Past Medical History:  Diagnosis Date   Anemia    Diabetes mellitus without complication (HCC)    GM - Diet controlled - no meds   Hypertension    PIH with pregnancy   Sleep apnea    does not use CPAP - lost weight   SVD (spontaneous vaginal delivery)    x 2   Past Surgical History:  Procedure Laterality Date   CESAREAN SECTION  03/13/2010   CESAREAN SECTION WITH BILATERAL TUBAL LIGATION N/A 02/25/2014   Procedure: CESAREAN SECTION WITH BILATERAL TUBAL LIGATION;  Surgeon: Levie Heritage, DO;  Location: [facility];  Service: Obstetrics;  Laterality: N/A;   TOOTH EXTRACTION     Social History   Socioeconomic History   Marital status: Single    Spouse name: Not on file   Number of children: Not on file   Years of education: Not on file   Highest education level: GED or equivalent  Occupational History   Not on file  Tobacco Use   Smoking status: Former    Packs/day: 0.25    Years: 10.00    Additional pack years: 0.00    Total pack years: 2.50    Types: Cigarettes    Quit  date: 06/13/2008    Years since quitting: 14.4   Smokeless tobacco: Never  Substance and Sexual Activity   Alcohol use: Yes   Drug use: No   Sexual activity: Yes    Birth control/protection: None  Other Topics Concern   Not on file  Social History Narrative   Not on file   Social Determinants of Health   Financial Resource Strain: Medium Risk (10/14/2022)   Overall Financial Resource Strain (CARDIA)    Difficulty of Paying Living Expenses: Somewhat hard  Food Insecurity: No Food Insecurity (10/14/2022)   Hunger Vital Sign    Worried About Running Out of Food in the Last Year: Never true    Ran Out of Food in the Last Year: Never true  Transportation Needs: No Transportation Needs (10/14/2022)   PRAPARE - Administrator, Civil Service (Medical): No    Lack of Transportation (Non-Medical): No  Physical Activity: Unknown (10/14/2022)   Exercise Vital Sign    Days of Exercise per Week: 7 days  Minutes of Exercise per Session: Patient declined  Stress: No Stress Concern Present (10/14/2022)   [employer] of Occupational Health - Occupational Stress Questionnaire    Feeling of Stress : Not at all  Social Connections: Moderately Isolated (10/14/2022)   Social Connection and Isolation Panel [NHANES]    Frequency of Communication with Friends and Family: Once a week    Frequency of Social Gatherings with Friends and Family: Once a week    Attends Religious Services: 1 to 4 times per year    Active Member of [employer] or Organizations: No    Attends Engineer, structural: Not on file    Marital Status: Married   No family history on file. Allergies  Allergen Reactions   Codeine Anaphylaxis and Swelling    Puffy eyes, pt can take Percocet   Current Outpatient Medications  Medication Sig Dispense Refill   diclofenac (VOLTAREN) 50 MG EC tablet Take 1 tablet (50 mg total) by mouth 2 (two) times daily. 14 tablet 0   metFORMIN (GLUCOPHAGE-XR) 500 MG 24 hr tablet Take 1  tablet (500 mg total) by mouth 2 (two) times daily with a meal. 60 tablet 2   methocarbamol (ROBAXIN) 500 MG tablet Take 1 tablet (500 mg total) by mouth 2 (two) times daily. 20 tablet 0   omeprazole (PRILOSEC) 20 MG capsule Take 1 capsule (20 mg total) by mouth daily. 30 capsule 0   predniSONE (STERAPRED UNI-PAK 21 TAB) 10 MG (21) TBPK tablet Take by mouth daily. Take 6 tabs by mouth daily  for 2 days, then 5 tabs for 2 days, then 4 tabs for 2 days, then 3 tabs for 2 days, 2 tabs for 2 days, then 1 tab by mouth daily for 2 days 42 tablet 0   Prenatal Vit-Fe Fumarate-FA (PRENATAL MULTIVITAMIN) TABS tablet Take 1 tablet by mouth daily at 12 noon. (Patient not taking: Reported on 10/14/2022)     No current facility-administered medications for this visit.   No results found.  Review of Systems:   A ROS was performed including pertinent positives and negatives as documented in the HPI.  Physical Exam :   Constitutional: NAD and appears stated age Neurological: Alert and oriented Psych: Appropriate affect and cooperative There were no vitals taken for this visit.   Comprehensive Musculoskeletal Exam:    Inspection Right Left  Skin No atrophy or gross abnormalities appreciated No atrophy or gross abnormalities appreciated  Palpation    Tenderness Greater trochanter None  Crepitus None None  Range of Motion    Flexion (passive) 120 120  Extension 30 30  IR 30 30  ER 45 45  Strength    Flexion  5/5 5/5  Extension 5/5 5/5  Special Tests    FABER Negative Negative  FADIR Negative Negative  ER Lag/Capsular Insufficiency Negative Negative  Instability Negative Negative  Sacroiliac pain Negative  Negative   Instability    Generalized Laxity No No  Neurologic    sciatic, femoral, obturator nerves intact to light sensation  Vascular/Lymphatic    DP pulse 2+ 2+  Lumbar Exam    Patient has symmetric lumbar range of motion with negative pain referral to hip     Imaging:   Xray (AP  pelvis, right hip 2 views): Normal    I personally reviewed and interpreted the radiographs.   Assessment:   42 y.o. 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  At this time I would like to start with  an injection into this area as well as physical therapy for a strong gluteal strengthening program that she can work on.  I will plan to see her back in 6 weeks to check on her progress.  I am asked that she bring her MRI disc at that time so I can review this with her  Plan :    -Right ultrasound-guided gluteus medius injection performed today after verbal consent obtained as well as physical therapy ordered for a home program for strengthening     I personally saw and evaluated the patient, and participated in the management and treatment plan.  Huel Cote, MD Attending Physician, Orthopedic Surgery  This document was dictated using Dragon voice recognition software. A reasonable attempt at proof reading has been made to minimize errors.

## 2022-11-30 ENCOUNTER — Ambulatory Visit: Payer: Self-pay | Admitting: Nurse Practitioner

## 2022-12-01 ENCOUNTER — Ambulatory Visit (INDEPENDENT_AMBULATORY_CARE_PROVIDER_SITE_OTHER): Payer: Self-pay | Admitting: Primary Care

## 2022-12-30 ENCOUNTER — Ambulatory Visit (HOSPITAL_BASED_OUTPATIENT_CLINIC_OR_DEPARTMENT_OTHER): Payer: Self-pay | Admitting: Orthopaedic Surgery

## 2023-01-11 ENCOUNTER — Ambulatory Visit (INDEPENDENT_AMBULATORY_CARE_PROVIDER_SITE_OTHER): Payer: Self-pay | Admitting: Orthopaedic Surgery

## 2023-01-11 DIAGNOSIS — S76011S Strain of muscle, fascia and tendon of right hip, sequela: Secondary | ICD-10-CM

## 2023-01-11 NOTE — Progress Notes (Signed)
Chief Complaint: Right hip pain     History of Present Illness:   01/11/2023: Follow-up after right hip gluteal injection.  She did get significant relief from this although she is still having persistent shock type pain that is running from the back into the posterior gluteus.  Caitlyn [name] is a 42 y.o. female presents with ongoing right hip pain for the last several years.  She does have a history of lower back pain which she has not been able to treat.  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  With regard to the right hip she states that this has been lateral based although there is some radiation to the distal thigh.  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  She has previously seen an outside surgeon for discussion with her lumbar MRI which was essentially negative.  She did have an MRI of her right hip done although she does not have the disc with her today.  She has a hard time sleeping directly on the side    Surgical History:   None  PMH/PSH/Family History/Social History/Meds/Allergies:    Past Medical History:  Diagnosis Date   Anemia    Diabetes mellitus without complication (HCC)    GM - Diet controlled - no meds   Hypertension    PIH with pregnancy   Sleep apnea    does not use CPAP - lost weight   SVD (spontaneous vaginal delivery)    x 2   Past Surgical History:  Procedure Laterality Date   CESAREAN SECTION  03/13/2010   CESAREAN SECTION WITH BILATERAL TUBAL LIGATION N/A 02/25/2014   Procedure: CESAREAN SECTION WITH BILATERAL TUBAL LIGATION;  Surgeon: Levie Heritage, DO;  Location: [facility];  Service: Obstetrics;  Laterality: N/A;   TOOTH EXTRACTION     Social History   Socioeconomic History   Marital status: Single    Spouse name: Not on file   Number of children: Not on file   Years of education: Not on file   Highest education level: GED or  equivalent  Occupational History   Not on file  Tobacco Use   Smoking status: Former    Current packs/day: 0.00    Average packs/day: 0.3 packs/day for 10.0 years (2.5 ttl pk-yrs)    Types: Cigarettes    Start date: 06/13/1998    Quit date: 06/13/2008    Years since quitting: 14.5   Smokeless tobacco: Never  Substance and Sexual Activity   Alcohol use: Yes   Drug use: No   Sexual activity: Yes    Birth control/protection: None  Other Topics Concern   Not on file  Social History Narrative   Not on file   Social Determinants of Health   Financial Resource Strain: Medium Risk (10/14/2022)   Overall Financial Resource Strain (CARDIA)    Difficulty of Paying Living Expenses: Somewhat hard  Food Insecurity: No Food Insecurity (10/14/2022)   Hunger Vital Sign    Worried About Running Out of Food in the Last Year: Never true    Ran Out of Food in the Last Year: Never true  Transportation Needs: No Transportation Needs (10/14/2022)   PRAPARE - Transportation  Lack of Transportation (Medical): No    Lack of Transportation (Non-Medical): No  Physical Activity: Unknown (10/14/2022)   Exercise Vital Sign    Days of Exercise per Week: 7 days    Minutes of Exercise per Session: Patient declined  Stress: No Stress Concern Present (10/14/2022)   [employer] of Occupational Health - Occupational Stress Questionnaire    Feeling of Stress : Not at all  Social Connections: Moderately Isolated (10/14/2022)   Social Connection and Isolation Panel [NHANES]    Frequency of Communication with Friends and Family: Once a week    Frequency of Social Gatherings with Friends and Family: Once a week    Attends Religious Services: 1 to 4 times per year    Active Member of [employer] or Organizations: No    Attends Engineer, structural: Not on file    Marital Status: Married   No family history on file. Allergies  Allergen Reactions   Codeine Anaphylaxis and Swelling    Puffy eyes, pt can take  Percocet   Current Outpatient Medications  Medication Sig Dispense Refill   diclofenac (VOLTAREN) 50 MG EC tablet Take 1 tablet (50 mg total) by mouth 2 (two) times daily. 14 tablet 0   metFORMIN (GLUCOPHAGE-XR) 500 MG 24 hr tablet Take 1 tablet (500 mg total) by mouth 2 (two) times daily with a meal. 60 tablet 2   methocarbamol (ROBAXIN) 500 MG tablet Take 1 tablet (500 mg total) by mouth 2 (two) times daily. 20 tablet 0   omeprazole (PRILOSEC) 20 MG capsule Take 1 capsule (20 mg total) by mouth daily. 30 capsule 0   predniSONE (STERAPRED UNI-PAK 21 TAB) 10 MG (21) TBPK tablet Take by mouth daily. Take 6 tabs by mouth daily  for 2 days, then 5 tabs for 2 days, then 4 tabs for 2 days, then 3 tabs for 2 days, 2 tabs for 2 days, then 1 tab by mouth daily for 2 days 42 tablet 0   Prenatal Vit-Fe Fumarate-FA (PRENATAL MULTIVITAMIN) TABS tablet Take 1 tablet by mouth daily at 12 noon. (Patient not taking: Reported on 10/14/2022)     No current facility-administered medications for this visit.   No results found.  Review of Systems:   A ROS was performed including pertinent positives and negatives as documented in the HPI.  Physical Exam :   Constitutional: NAD and appears stated age Neurological: Alert and oriented Psych: Appropriate affect and cooperative There were no vitals taken for this visit.   Comprehensive Musculoskeletal Exam:    Inspection Right Left  Skin No atrophy or gross abnormalities appreciated No atrophy or gross abnormalities appreciated  Palpation    Tenderness Greater trochanter None  Crepitus None None  Range of Motion    Flexion (passive) 120 120  Extension 30 30  IR 30 30  ER 45 45  Strength    Flexion  5/5 5/5  Extension 5/5 5/5  Special Tests    FABER Negative Negative  FADIR Negative Negative  ER Lag/Capsular Insufficiency Negative Negative  Instability Negative Negative  Sacroiliac pain Negative  Negative   Instability    Generalized Laxity No No   Neurologic    sciatic, femoral, obturator nerves intact to light sensation  Vascular/Lymphatic    DP pulse 2+ 2+  Lumbar Exam    Patient has symmetric lumbar range of motion with negative pain referral to hip     Imaging:   Xray (AP pelvis, right hip 2 views): Normal  I personally reviewed and interpreted the radiographs.   Assessment:   42 y.o. 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  She did get temporary relief from an injection although she is now still having persistent shocking type pain running down the back.  Given this I do believe it would be helpful to rule out a lumbar radiculitis versus a possible intra-articular source of hip pain although I do believe that her back is likely the underlying cause of her subsequent hip and knee pain.  Will plan to proceed with an MRI of the lumbar spine and pelvis and I will see her back following to discuss results  Plan :    -Plan for MRI and follow-up to discuss results     I personally saw and evaluated the patient, and participated in the management and treatment plan.  Huel Cote, MD Attending Physician, Orthopedic Surgery  This document was dictated using Dragon voice recognition software. A reasonable attempt at proof reading has been made to minimize errors.

## 2023-01-12 ENCOUNTER — Other Ambulatory Visit (HOSPITAL_COMMUNITY)
Admission: RE | Admit: 2023-01-12 | Discharge: 2023-01-12 | Disposition: A | Discharge status: Home / Self Care | Payer: Self-pay | Source: Ambulatory Visit | Attending: Nurse Practitioner | Admitting: Nurse Practitioner

## 2023-01-12 ENCOUNTER — Ambulatory Visit (INDEPENDENT_AMBULATORY_CARE_PROVIDER_SITE_OTHER): Payer: Self-pay | Admitting: Nurse Practitioner

## 2023-01-12 ENCOUNTER — Encounter: Payer: Self-pay | Admitting: Nurse Practitioner

## 2023-01-12 VITALS — BP 121/87 | HR 80 | Temp 97.4°F | Ht 64.0 in | Wt 212.0 lb

## 2023-01-12 DIAGNOSIS — Z124 Encounter for screening for malignant neoplasm of cervix: Secondary | ICD-10-CM | POA: Insufficient documentation

## 2023-01-12 DIAGNOSIS — E119 Type 2 diabetes mellitus without complications: Secondary | ICD-10-CM

## 2023-01-12 LAB — POCT GLYCOSYLATED HEMOGLOBIN (HGB A1C): Hemoglobin A1C: 13.7 % — AB (ref 4.0–5.6)

## 2023-01-12 MED ORDER — BLOOD GLUCOSE MONITORING SUPPL DEVI: 1.0000 | Freq: Three times a day (TID) | 0 refills | Status: DC

## 2023-01-12 MED ORDER — BLOOD GLUCOSE TEST VI STRP: 1.0000 | ORAL_STRIP | Freq: Three times a day (TID) | 0 refills | Status: AC

## 2023-01-12 MED ORDER — LANCETS MISC. MISC: 1.0000 | Freq: Three times a day (TID) | 0 refills | Status: AC

## 2023-01-12 MED ORDER — LANCET DEVICE MISC: 1.0000 | Freq: Three times a day (TID) | 0 refills | Status: AC

## 2023-01-12 NOTE — Patient Instructions (Addendum)
1. Type 2 diabetes mellitus without complication, without long-term current use of insulin (HCC)   - POCT glycosylated hemoglobin (Hb A1C) - AMB Referral to Pharmacy Medication Management - Blood Glucose Monitoring Suppl DEVI; 1 each by Does not apply route in the morning, at noon, and at bedtime. May substitute to any manufacturer covered by patient's insurance.  Dispense: 1 each; Refill: 0 - Glucose Blood (BLOOD GLUCOSE TEST STRIPS) STRP; 1 each by In Vitro route in the morning, at noon, and at bedtime. May substitute to any manufacturer covered by patient's insurance.  Dispense: 100 strip; Refill: 0 - Lancet Device MISC; 1 each by Does not apply route in the morning, at noon, and at bedtime. May substitute to any manufacturer covered by patient's insurance.  Dispense: 1 each; Refill: 0 - Lancets Misc. MISC; 1 each by Does not apply route in the morning, at noon, and at bedtime. May substitute to any manufacturer covered by patient's insurance.  Dispense: 100 each; Refill: 0 - Lipid Panel - TSH - CBC - Comprehensive metabolic panel - MM 3D SCREENING MAMMOGRAM BILATERAL BREAST   Follow up:  Follow up in 3 months

## 2023-01-12 NOTE — Progress Notes (Signed)
@Patient  ID: Caitlyn [name], female    DOB: 1980-08-04, 42 y.o.   MRN: [id number]  Chief Complaint  Patient presents with   Follow-up    Referring provider: Ivonne Andrew, NP   HPI  Patient presents today for a follow-up visit.  A1c in office today is 13.7.  It was over 14 at last visit.  Patient was prescribed metformin but she did not start this medication.  She has been working some on diet.  She is worried about side effects with the medication.  We had a discussion about this in office today.  We will have pharmacy follow-up with her for medication management.  Patient states that she will start metformin today.  If she has any side effects we will switch this to a different medication.  Patient is due for repeat blood work today.    Allergies  Allergen Reactions   Codeine Anaphylaxis and Swelling    Puffy eyes, pt can take Percocet    Immunization History  Administered Date(s) Administered   Tdap 12/09/2013    Past Medical History:  Diagnosis Date   Anemia    Diabetes mellitus without complication (HCC)    GM - Diet controlled - no meds   Hypertension    PIH with pregnancy   Sleep apnea    does not use CPAP - lost weight   SVD (spontaneous vaginal delivery)    x 2    Tobacco History: Social History   Tobacco Use  Smoking Status Former   Current packs/day: 0.00   Average packs/day: 0.3 packs/day for 10.0 years (2.5 ttl pk-yrs)   Types: Cigarettes   Start date: 06/13/1998   Quit date: 06/13/2008   Years since quitting: 14.5  Smokeless Tobacco Never   Counseling given: Not Answered   Outpatient Encounter Medications as of 01/12/2023  Medication Sig   Blood Glucose Monitoring Suppl DEVI 1 each by Does not apply route in the morning, at noon, and at bedtime. May substitute to any manufacturer covered by patient's insurance.   Glucose Blood (BLOOD GLUCOSE TEST STRIPS) STRP 1 each by In Vitro route in the morning, at noon, and at bedtime. May substitute to  any manufacturer covered by patient's insurance.   Lancet Device MISC 1 each by Does not apply route in the morning, at noon, and at bedtime. May substitute to any manufacturer covered by patient's insurance.   Lancets Misc. MISC 1 each by Does not apply route in the morning, at noon, and at bedtime. May substitute to any manufacturer covered by patient's insurance.   diclofenac (VOLTAREN) 50 MG EC tablet Take 1 tablet (50 mg total) by mouth 2 (two) times daily.   metFORMIN (GLUCOPHAGE-XR) 500 MG 24 hr tablet Take 1 tablet (500 mg total) by mouth 2 (two) times daily with a meal.   methocarbamol (ROBAXIN) 500 MG tablet Take 1 tablet (500 mg total) by mouth 2 (two) times daily.   omeprazole (PRILOSEC) 20 MG capsule Take 1 capsule (20 mg total) by mouth daily.   predniSONE (STERAPRED UNI-PAK 21 TAB) 10 MG (21) TBPK tablet Take by mouth daily. Take 6 tabs by mouth daily  for 2 days, then 5 tabs for 2 days, then 4 tabs for 2 days, then 3 tabs for 2 days, 2 tabs for 2 days, then 1 tab by mouth daily for 2 days   Prenatal Vit-Fe Fumarate-FA (PRENATAL MULTIVITAMIN) TABS tablet Take 1 tablet by mouth daily at 12 noon. (Patient not taking: Reported  on 10/14/2022)   No facility-administered encounter medications on file as of 01/12/2023.     Review of Systems  Review of Systems  Constitutional: Negative.   HENT: Negative.    Cardiovascular: Negative.   Gastrointestinal: Negative.   Allergic/Immunologic: Negative.   Neurological: Negative.   Psychiatric/Behavioral: Negative.         Physical Exam  BP 121/87   Pulse 80   Temp (!) 97.4 F (36.3 C) (Temporal)   Ht 5\' 4"  (1.626 m)   Wt 212 lb (96.2 kg)   SpO2 97%   BMI 36.39 kg/m   Wt Readings from Last 5 Encounters:  01/12/23 212 lb (96.2 kg)  10/14/22 216 lb (98 kg)  07/02/19 234 lb (106.1 kg)  06/16/19 230 lb (104.3 kg)  06/15/15 287 lb (130.2 kg)     Physical Exam Vitals and nursing note reviewed.  Constitutional:      General:  She is not in acute distress.    Appearance: She is well-developed.  Cardiovascular:     Rate and Rhythm: Normal rate and regular rhythm.  Pulmonary:     Effort: Pulmonary effort is normal.     Breath sounds: Normal breath sounds.  Neurological:     Mental Status: She is alert and oriented to person, place, and time.      Lab Results:  CBC    Component Value Date/Time   WBC 8.4 10/14/2022 1446   WBC 4.1 07/02/2019 1311   RBC 4.98 10/14/2022 1446   RBC 5.08 07/02/2019 1311   HGB 13.5 10/14/2022 1446   HGB 12.7 08/06/2013 0000   HCT 42.2 10/14/2022 1446   HCT 37 08/06/2013 0000   PLT 302 10/14/2022 1446   PLT 286 08/06/2013 0000   MCV 85 10/14/2022 1446   MCH 27.1 10/14/2022 1446   MCH 27.8 07/02/2019 1311   MCHC 32.0 10/14/2022 1446   MCHC 32.1 07/02/2019 1311   RDW 12.7 10/14/2022 1446   LYMPHSABS 1.6 07/02/2019 1311   MONOABS 0.4 07/02/2019 1311   EOSABS 0.0 07/02/2019 1311   BASOSABS 0.0 07/02/2019 1311    BMET    Component Value Date/Time   NA 132 (L) 10/14/2022 1446   K 4.7 10/14/2022 1446   CL 96 10/14/2022 1446   CO2 21 10/14/2022 1446   GLUCOSE 466 (H) 10/14/2022 1446   GLUCOSE 247 (H) 07/02/2019 1311   GLUCOSE 107 (H) 09/09/2013 1237   BUN 13 10/14/2022 1446   CREATININE 0.78 10/14/2022 1446   CREATININE 0.62 02/03/2014 0931   CALCIUM 10.3 (H) 10/14/2022 1446   GFRNONAA >60 07/02/2019 1311   GFRAA >60 07/02/2019 1311    BNP No results found for: "BNP"  ProBNP    Component Value Date/Time   PROBNP 335.0 (H) 03/17/2010 1201    Imaging: No results found.   Assessment & Plan:   Type 2 diabetes mellitus without complication, without long-term current use of insulin (HCC) - POCT glycosylated hemoglobin (Hb A1C) - AMB Referral to Pharmacy Medication Management - Blood Glucose Monitoring Suppl DEVI; 1 each by Does not apply route in the morning, at noon, and at bedtime. May substitute to any manufacturer covered by patient's insurance.   Dispense: 1 each; Refill: 0 - Glucose Blood (BLOOD GLUCOSE TEST STRIPS) STRP; 1 each by In Vitro route in the morning, at noon, and at bedtime. May substitute to any manufacturer covered by patient's insurance.  Dispense: 100 strip; Refill: 0 - Lancet Device MISC; 1 each by Does not apply  route in the morning, at noon, and at bedtime. May substitute to any manufacturer covered by patient's insurance.  Dispense: 1 each; Refill: 0 - Lancets Misc. MISC; 1 each by Does not apply route in the morning, at noon, and at bedtime. May substitute to any manufacturer covered by patient's insurance.  Dispense: 100 each; Refill: 0 - Lipid Panel - TSH - CBC - Comprehensive metabolic panel - MM 3D SCREENING MAMMOGRAM BILATERAL BREAST   Follow up:  Follow up in 3 months     Ivonne Andrew, NP 01/12/2023

## 2023-01-12 NOTE — Assessment & Plan Note (Signed)
-   POCT glycosylated hemoglobin (Hb A1C) - AMB Referral to Pharmacy Medication Management - Blood Glucose Monitoring Suppl DEVI; 1 each by Does not apply route in the morning, at noon, and at bedtime. May substitute to any manufacturer covered by patient's insurance.  Dispense: 1 each; Refill: 0 - Glucose Blood (BLOOD GLUCOSE TEST STRIPS) STRP; 1 each by In Vitro route in the morning, at noon, and at bedtime. May substitute to any manufacturer covered by patient's insurance.  Dispense: 100 strip; Refill: 0 - Lancet Device MISC; 1 each by Does not apply route in the morning, at noon, and at bedtime. May substitute to any manufacturer covered by patient's insurance.  Dispense: 1 each; Refill: 0 - Lancets Misc. MISC; 1 each by Does not apply route in the morning, at noon, and at bedtime. May substitute to any manufacturer covered by patient's insurance.  Dispense: 100 each; Refill: 0 - Lipid Panel - TSH - CBC - Comprehensive metabolic panel - MM 3D SCREENING MAMMOGRAM BILATERAL BREAST   Follow up:  Follow up in 3 months

## 2023-01-13 ENCOUNTER — Other Ambulatory Visit: Payer: Self-pay | Admitting: Nurse Practitioner

## 2023-01-13 MED ORDER — ROSUVASTATIN CALCIUM 10 MG PO TABS: 10.0000 mg | ORAL_TABLET | Freq: Every day | ORAL | 11 refills | Status: DC

## 2023-01-16 ENCOUNTER — Ambulatory Visit: Payer: Self-pay | Admitting: Nurse Practitioner

## 2023-01-18 ENCOUNTER — Telehealth: Payer: Self-pay

## 2023-01-18 NOTE — Progress Notes (Signed)
   Care Guide Note  01/18/2023 Name: Caitlyn [name] MRN: [id number] DOB: 03-06-1981  Referred by: Ivonne Andrew, NP Reason for referral : No chief complaint on file.   Caitlyn [name] is a 42 y.o. year old female who is a primary care patient of Ivonne Andrew, NP. Caitlyn [name] was referred to the pharmacist for assistance related to DM.    An unsuccessful telephone outreach was attempted today to contact the patient who was referred to the pharmacy team for assistance with medication management. Additional attempts will be made to contact the patient.   Penne Lash, RMA Care Guide [facility]  [place], [place] [address] Direct Dial: [phone] [email or web address]

## 2023-01-26 NOTE — Progress Notes (Signed)
   Care Guide Note  01/26/2023 Name: Caitlyn [name] MRN: [id number] DOB: 1980/11/25  Referred by: Ivonne Andrew, NP Reason for referral : Care Management (Outreach to schedule with pharm d )   Caitlyn [name] is a 42 y.o. year old female who is a primary care patient of Ivonne Andrew, NP. Caitlyn [name] was referred to the pharmacist for assistance related to DM.    A second unsuccessful telephone outreach was attempted today to contact the patient who was referred to the pharmacy team for assistance with medication management. Additional attempts will be made to contact the patient.  Caitlyn [name], RMA Care Guide [facility]  [place], [place] [address] Direct Dial: [phone] [email or web address]

## 2023-01-31 NOTE — Progress Notes (Signed)
   Care Guide Note  01/31/2023 Name: Caitlyn [name] MRN: [id number] DOB: Oct 22, 1980  Referred by: Ivonne Andrew, NP Reason for referral : Care Management (Outreach to schedule with pharm d )   Caitlyn [name] is a 42 y.o. year old female who is a primary care patient of Ivonne Andrew, NP. Caitlyn [name] was referred to the pharmacist for assistance related to DM.    A third unsuccessful telephone outreach was attempted today to contact the patient who was referred to the pharmacy team for assistance with medication management. The Population Health team is pleased to engage with this patient at any time in the future upon receipt of referral and should he/she be interested in assistance from the [facility] team.   Penne Lash, RMA Care Guide [facility]  [place], [place] [address] Direct Dial: [phone] [email or web address]

## 2023-02-12 ENCOUNTER — Ambulatory Visit
Admission: RE | Admit: 2023-02-12 | Discharge: 2023-02-12 | Disposition: A | Discharge status: Home / Self Care | Payer: Self-pay | Source: Ambulatory Visit | Attending: Orthopaedic Surgery | Admitting: Orthopaedic Surgery

## 2023-02-12 DIAGNOSIS — S76011S Strain of muscle, fascia and tendon of right hip, sequela: Secondary | ICD-10-CM

## 2023-02-12 MED ORDER — GADOPICLENOL 0.5 MMOL/ML IV SOLN
9.0000 mL | Freq: Once | INTRAVENOUS | Status: AC | PRN
  Administered 2023-02-12: 9 mL via INTRAVENOUS

## 2023-02-17 ENCOUNTER — Ambulatory Visit (HOSPITAL_BASED_OUTPATIENT_CLINIC_OR_DEPARTMENT_OTHER): Payer: Self-pay | Admitting: Orthopaedic Surgery

## 2023-02-21 ENCOUNTER — Telehealth: Payer: Self-pay

## 2023-02-21 NOTE — Telephone Encounter (Signed)
Attempted wellness /follow up call to [facility] client that was enrolled in our program through the [facility]. Last appointment at [facility] was 03/10/22.  Appears per EPIC client is now being [facility].  No answer today left message requesting return call to check in with client for any possible services needed with our program.  Francee Nodal RN [employer]

## 2023-02-24 ENCOUNTER — Encounter (HOSPITAL_BASED_OUTPATIENT_CLINIC_OR_DEPARTMENT_OTHER): Payer: Self-pay | Admitting: Orthopaedic Surgery

## 2023-02-24 ENCOUNTER — Ambulatory Visit (INDEPENDENT_AMBULATORY_CARE_PROVIDER_SITE_OTHER): Payer: Self-pay | Admitting: Orthopaedic Surgery

## 2023-02-24 DIAGNOSIS — S76011S Strain of muscle, fascia and tendon of right hip, sequela: Secondary | ICD-10-CM

## 2023-02-24 NOTE — Progress Notes (Signed)
Chief Complaint: Right hip pain     History of Present Illness:   02/24/2023: Follow-up after right hip gluteal injection.  She did get overall extremely good relief although this is subsequently worn off.  She is having a hard time going up and down stairs.   Caitlyn [name] is a 42 y.o. female presents with ongoing right hip pain for the last several years.  She does have a history of lower back pain which she has not been able to treat.  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  With regard to the right hip she states that this has been lateral based although there is some radiation to the distal thigh.  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  She has previously seen an outside surgeon for discussion with her lumbar MRI which was essentially negative.  She did have an MRI of her right hip done although she does not have the disc with her today.  She has a hard time sleeping directly on the side    Surgical History:   None  PMH/PSH/Family History/Social History/Meds/Allergies:    Past Medical History:  Diagnosis Date   Anemia    Diabetes mellitus without complication (HCC)    GM - Diet controlled - no meds   Hypertension    PIH with pregnancy   Sleep apnea    does not use CPAP - lost weight   SVD (spontaneous vaginal delivery)    x 2   Past Surgical History:  Procedure Laterality Date   CESAREAN SECTION  03/13/2010   CESAREAN SECTION WITH BILATERAL TUBAL LIGATION N/A 02/25/2014   Procedure: CESAREAN SECTION WITH BILATERAL TUBAL LIGATION;  Surgeon: Levie Heritage, DO;  Location: [facility];  Service: Obstetrics;  Laterality: N/A;   TOOTH EXTRACTION     Social History   Socioeconomic History   Marital status: Single    Spouse name: Not on file   Number of children: Not on file   Years of education: Not on file   Highest education level: GED or equivalent   Occupational History   Not on file  Tobacco Use   Smoking status: Former    Current packs/day: 0.00    Average packs/day: 0.3 packs/day for 10.0 years (2.5 ttl pk-yrs)    Types: Cigarettes    Start date: 06/13/1998    Quit date: 06/13/2008    Years since quitting: 14.7   Smokeless tobacco: Never  Substance and Sexual Activity   Alcohol use: Yes   Drug use: No   Sexual activity: Yes    Birth control/protection: None  Other Topics Concern   Not on file  Social History Narrative   Not on file   Social Determinants of Health   Financial Resource Strain: Medium Risk (10/14/2022)   Overall Financial Resource Strain (CARDIA)    Difficulty of Paying Living Expenses: Somewhat hard  Food Insecurity: No Food Insecurity (10/14/2022)   Hunger Vital Sign    Worried About Running Out of Food in the Last Year: Never true    Ran Out of Food in the Last Year: Never true  Transportation Needs: No Transportation Needs (10/14/2022)   PRAPARE - Transportation  Lack of Transportation (Medical): No    Lack of Transportation (Non-Medical): No  Physical Activity: Unknown (10/14/2022)   Exercise Vital Sign    Days of Exercise per Week: 7 days    Minutes of Exercise per Session: Patient declined  Stress: No Stress Concern Present (10/14/2022)   [employer] of Occupational Health - Occupational Stress Questionnaire    Feeling of Stress : Not at all  Social Connections: Moderately Isolated (10/14/2022)   Social Connection and Isolation Panel [NHANES]    Frequency of Communication with Friends and Family: Once a week    Frequency of Social Gatherings with Friends and Family: Once a week    Attends Religious Services: 1 to 4 times per year    Active Member of [employer] or Organizations: No    Attends Engineer, structural: Not on file    Marital Status: Married   History reviewed. No pertinent family history. Allergies  Allergen Reactions   Codeine Anaphylaxis and Swelling    Puffy eyes, pt can  take Percocet   Current Outpatient Medications  Medication Sig Dispense Refill   Blood Glucose Monitoring Suppl DEVI 1 each by Does not apply route in the morning, at noon, and at bedtime. May substitute to any manufacturer covered by patient's insurance. 1 each 0   diclofenac (VOLTAREN) 50 MG EC tablet Take 1 tablet (50 mg total) by mouth 2 (two) times daily. 14 tablet 0   metFORMIN (GLUCOPHAGE-XR) 500 MG 24 hr tablet Take 1 tablet (500 mg total) by mouth 2 (two) times daily with a meal. 60 tablet 2   methocarbamol (ROBAXIN) 500 MG tablet Take 1 tablet (500 mg total) by mouth 2 (two) times daily. 20 tablet 0   omeprazole (PRILOSEC) 20 MG capsule Take 1 capsule (20 mg total) by mouth daily. 30 capsule 0   predniSONE (STERAPRED UNI-PAK 21 TAB) 10 MG (21) TBPK tablet Take by mouth daily. Take 6 tabs by mouth daily  for 2 days, then 5 tabs for 2 days, then 4 tabs for 2 days, then 3 tabs for 2 days, 2 tabs for 2 days, then 1 tab by mouth daily for 2 days 42 tablet 0   Prenatal Vit-Fe Fumarate-FA (PRENATAL MULTIVITAMIN) TABS tablet Take 1 tablet by mouth daily at 12 noon. (Patient not taking: Reported on 10/14/2022)     rosuvastatin (CRESTOR) 10 MG tablet Take 1 tablet (10 mg total) by mouth daily. 30 tablet 11   No current facility-administered medications for this visit.   No results found.  Review of Systems:   A ROS was performed including pertinent positives and negatives as documented in the HPI.  Physical Exam :   Constitutional: NAD and appears stated age Neurological: Alert and oriented Psych: Appropriate affect and cooperative There were no vitals taken for this visit.   Comprehensive Musculoskeletal Exam:    Inspection Right Left  Skin No atrophy or gross abnormalities appreciated No atrophy or gross abnormalities appreciated  Palpation    Tenderness Greater trochanter None  Crepitus None None  Range of Motion    Flexion (passive) 120 120  Extension 30 30  IR 30 30  ER 45 45   Strength    Flexion  5/5 5/5  Extension 5/5 5/5  Special Tests    FABER Negative Negative  FADIR Negative Negative  ER Lag/Capsular Insufficiency Negative Negative  Instability Negative Negative  Sacroiliac pain Negative  Negative   Instability    Generalized Laxity No No  Neurologic  sciatic, femoral, obturator nerves intact to light sensation  Vascular/Lymphatic    DP pulse 2+ 2+  Lumbar Exam    Patient has symmetric lumbar range of motion with negative pain referral to hip    Positive pain about bilateral SI joints.  Imaging:   Xray (AP pelvis, right hip 2 views): Normal  MRI pelvis, lumbar spine: There is tearing of the gluteus minimus and medius  I personally reviewed and interpreted the radiographs.   Assessment:   42 y.o. 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  At this time she has having a very difficult time getting through work and going up and down stairs.  She did get extremely good relief from her gluteal injection.  Overall I do believe that this is the core of her problem.  We did discuss the ultimately I would like her to start with physical therapy regimen for strengthening of the core and hip.  Her right SI joint has been somewhat problematic for her and to that effect I would like to plan to refer her to Dr. Shon Baton for right SI injection in preparation for working with physical therapy and strengthening of the hip  Plan :    -Return to in 6 weeks for reassessment     I personally saw and evaluated the patient, and participated in the management and treatment plan.  Huel Cote, MD Attending Physician, Orthopedic Surgery  This document was dictated using Dragon voice recognition software. A reasonable attempt at proof reading has been made to minimize errors.

## 2023-03-10 ENCOUNTER — Ambulatory Visit: Payer: Self-pay | Admitting: Sports Medicine

## 2023-03-16 ENCOUNTER — Ambulatory Visit (HOSPITAL_COMMUNITY): Payer: Self-pay

## 2023-03-21 ENCOUNTER — Ambulatory Visit: Payer: Self-pay | Admitting: Sports Medicine

## 2023-03-31 ENCOUNTER — Ambulatory Visit (HOSPITAL_COMMUNITY): Payer: Self-pay | Attending: Orthopaedic Surgery

## 2023-03-31 ENCOUNTER — Other Ambulatory Visit: Payer: Self-pay

## 2023-03-31 DIAGNOSIS — R262 Difficulty in walking, not elsewhere classified: Secondary | ICD-10-CM | POA: Insufficient documentation

## 2023-03-31 DIAGNOSIS — S76011S Strain of muscle, fascia and tendon of right hip, sequela: Secondary | ICD-10-CM | POA: Insufficient documentation

## 2023-03-31 DIAGNOSIS — M25551 Pain in right hip: Secondary | ICD-10-CM | POA: Insufficient documentation

## 2023-03-31 DIAGNOSIS — M5459 Other low back pain: Secondary | ICD-10-CM | POA: Insufficient documentation

## 2023-03-31 NOTE — Therapy (Signed)
[facility] OUTPATIENT PHYSICAL THERAPY EVALUATION (THORACOLUMBAR)   Patient Name: Caitlyn [name] MRN: [id number] DOB:05/04/1981, 42 y.o., female Today's Date: 03/31/2023  END OF SESSION:   PT End of Session - 03/31/23 1119     Visit Number 1    Number of Visits 3    Authorization Type Self Pay    PT Start Time 0930    PT Stop Time 1015    PT Time Calculation (min) 45 min    Activity Tolerance Patient tolerated treatment well    Behavior During Therapy WFL for tasks assessed/performed              Past Medical History:  Diagnosis Date   Anemia    Diabetes mellitus without complication (HCC)    GM - Diet controlled - no meds   Hypertension    PIH with pregnancy   Sleep apnea    does not use CPAP - lost weight   SVD (spontaneous vaginal delivery)    x 2   Past Surgical History:  Procedure Laterality Date   CESAREAN SECTION  03/13/2010   CESAREAN SECTION WITH BILATERAL TUBAL LIGATION N/A 02/25/2014   Procedure: CESAREAN SECTION WITH BILATERAL TUBAL LIGATION;  Surgeon: Levie Heritage, DO;  Location: [facility];  Service: Obstetrics;  Laterality: N/A;   TOOTH EXTRACTION     Patient Active Problem List   Diagnosis Date Noted   Type 2 diabetes mellitus without complication, without long-term current use of insulin (HCC) 01/12/2023   Right hip pain 10/19/2022   Status post cesarean delivery 02/25/2014   Fetal macrosomia in pregnancy in third trimester 02/20/2014   Hypertension in pregnancy, pre-existing, antepartum 10/14/2013   Diabetes mellitus (Class B), antepartum 09/10/2013   Hx of preeclampsia, prior pregnancy, currently pregnant 08/21/2013   Previous cesarean section complicating pregnancy 08/21/2013   Hypertension 08/21/2013   Obesity complicating pregnancy in first trimester 08/21/2013   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    PCP: Ivonne Andrew, NPPCP - General   REFERRING PROVIDER:   Huel Cote, MD    REFERRING DIAG: S76.011S (ICD-10-CM) - Tear of gluteus minimus  tendon, right, sequela   Rationale for Evaluation and Treatment: Rehabilitation  THERAPY DIAG:  Difficulty in walking, not elsewhere classified  Pain in right hip  Other low back pain  ONSET DATE: 1+ year  --------------------------------------------------------------------------------------------- SUBJECTIVE:                                                                                                                                                                                           SUBJECTIVE STATEMENT: Patient has had right lower extremity shooting pain for 1 year +;  patient states it goes away for a few months. Most recently, July 2024, patient has had this pain. Patient went to MD in 02/2023 who did MRI Pelvis, MRI lumbar. As pewr patient, MD states she had a tear of gluteus minimus tendon on the right lower extremity; imaging results do not confirm this finding. Patient referred to OPPT  PERTINENT HISTORY:  DM II HTN  Sleep Apnea  PAIN:  Are you having pain? Yes: NPRS scale: 7/10 Pain location: right hip  Pain description: sharp, ache Aggravating factors: walking, lifting right hip  Relieving factors: rest  PRECAUTIONS: None  RED FLAGS: None   WEIGHT BEARING RESTRICTIONS: No  FALLS:  Has patient fallen in last 6 months? No  LIVING ENVIRONMENT: Lives with: lives with their family and lives with their spouse Lives in: House/apartment  OCCUPATION: out of work   PLOF: Independent   PATIENT GOALS: To walk pain free   NEXT MD VISIT: 10/25 --------------------------------------------------------------------------------------------- OBJECTIVE:   DIAGNOSTIC FINDINGS:  Pelvis   1. Normal lumbar plexus. 2. Mild osteoarthritis of bilateral SI joints, right worse than left. Lumbar  Mild degenerative changes through from L3-L4 through L5-S1 without significant spinal canal or neural foraminal stenosis. No specific finding to explain the patient's  symptoms. Hip Mild bilateral femoroacetabular osteoarthritis.    SCREENING FOR RED FLAGS: Bowel or bladder incontinence: No Spinal tumors: No Cauda equina syndrome: No Compression fracture: No Abdominal aneurysm: No  COGNITION: Overall cognitive status: Within functional limits for tasks assessed  POSTURE: No Significant postural limitations       GAIT ANALYSIS: Distance walked: 25 feet Assistive device utilized: None Level of assistance: Modified independence Comments: Patient with Mod right lower extremity antalgia; decreased stance on right lower extremity; decreased dstep length   SENSATION: WFL   LUMBAR ROM:   AROM eval  Flexion Mid shin*  Extension Unable due to pain  Right lateral flexion Mid thigh  Left lateral flexion Mid thigh   Right rotation   Left rotation    (Blank rows = not tested; * = limited by pain)  HIP PROM  Right Hip   Flexion: 0-110*   IR: 0-25*   ER: WFL     LOWER EXTREMITY MMT:    Left ankle/hip/knee grossly 4/5  Right ankle/hip/knee grossly 4/5 except:   Right hip flexion: 3-/5  Right hip extension 3-/5  Right hip ER 3/5  PALPATION: Moderate tenderness to palpation and guarding right Gluteals(Med/ Max) Moderate guarding right hip felxor  --------------------------------------------------------------------------------------------- TODAY'S TREATMENT:                                                                                                                              DATE:   03/31/23 PT EVAL HEP- see below    PATIENT EDUCATION:  Education details: HEP Person educated: Patient Education method: Medical illustrator Education comprehension: verbalized understanding  HOME EXERCISE PROGRAM: Access Code: [email or web address] URL: [email or web address] Date: 03/31/2023 Prepared by: Seymour Bars  Exercises - Supine Heel Slide  - 1-2 x daily - 7 x weekly - 20 reps - Supine March  - 1-2 x daily - 7 x  weekly - 20 reps - Hooklying Clamshell with Resistance  - 1-2 x daily - 7 x weekly - 20 reps - Bridge with Abduction and Resistance Loop  - 1-2 x daily - 7 x weekly - 20 reps - Clam with Resistance  - 1-2 x daily - 7 x weekly - 20 reps  Patient Education - Trochanteric Bursitis - Femoroacetabular Impingement - Reciprocal Gait with Cane --------------------------------------------------------------------------------------------- ASSESSMENT:  CLINICAL IMPRESSION: Patient is a 42 y.o. y.o. female who was seen today for physical therapy evaluation and treatment for difficulty walking due to right hip pain/ low back pain . Patient presents to PT with the following objective impairments: Abnormal gait, decreased endurance, difficulty walking, decreased strength, increased muscle spasms, improper body mechanics, and pain. These impairments limit the patient in activities such as carrying, lifting, standing, squatting, stairs, and locomotion level. These impairments also limit the patient in participation such as meal prep, cleaning, laundry, personal finances, interpersonal relationship, driving, shopping, community activity, and yard work. The patient will benefit from PT to address the limitations/impairments listed below to return to their prior level of function in the domains of activity and participation.    PERSONAL FACTORS: Time since onset of injury/illness/exacerbation are also affecting patient's functional outcome.   REHAB POTENTIAL: Fair    CLINICAL DECISION MAKING: Stable/uncomplicated  EVALUATION COMPLEXITY: Low  --------------------------------------------------------------------------------------------- GOALS: Goals reviewed with patient? No  SHORT TERM GOALS: Target date: 2 session   1. Patient will be independent with a basic stretching/strengthening HEP  Baseline:  Goal status: INITIAL   LONG TERM GOALS: Target date: 3 sessions    2.   Patient will be able to demo  gait with minimal antalgia, 1-2/10 pain, and no AD  to demonstrate an improvement in community ambulation  Baseline: 7/10 pain; moderate antalgia Goal status: INITIAL  3.  Patient will be independent with a comprehensive strengthening HEP  Baseline:  Goal status: INITIAL  --------------------------------------------------------------------------------------------- PLAN:  PT FREQUENCY: every other week  PT DURATION:  3 sessions  PLANNED INTERVENTIONS: 97110-Therapeutic exercises, 97530- Therapeutic activity, 97112- Neuromuscular re-education, 97535- Self Care, [id number]- Manual therapy, [phone]- Gait training, Balance training, Stair training, Dry Needling, Joint mobilization, Joint manipulation, Spinal manipulation, Spinal mobilization, Cryotherapy, and Moist heat.  PLAN FOR NEXT SESSION: Progress lower extremity HEP    Seymour Bars, PT 03/31/2023, 11:22 AM

## 2023-04-07 ENCOUNTER — Ambulatory Visit (INDEPENDENT_AMBULATORY_CARE_PROVIDER_SITE_OTHER): Payer: Self-pay | Admitting: Orthopaedic Surgery

## 2023-04-07 DIAGNOSIS — G8929 Other chronic pain: Secondary | ICD-10-CM

## 2023-04-07 DIAGNOSIS — S76011S Strain of muscle, fascia and tendon of right hip, sequela: Secondary | ICD-10-CM

## 2023-04-07 DIAGNOSIS — M533 Sacrococcygeal disorders, not elsewhere classified: Secondary | ICD-10-CM

## 2023-04-07 NOTE — Progress Notes (Signed)
Chief Complaint: Right hip pain     History of Present Illness:   04/07/2023: Presents today for follow-up of her right hip.  Unfortunately she states that she did not have a good experience in terms of her previous therapy appointment.  She was not able to obtain an SI injection as she was scared by this.  Overall continue to have pain about the right SI as well as lateral trochanter  Caitlyn [name] is a 42 y.o. female presents with ongoing right hip pain for the last several years.  She does have a history of lower back pain which she has not been able to treat.  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  With regard to the right hip she states that this has been lateral based although there is some radiation to the distal thigh.  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  She has previously seen an outside surgeon for discussion with her lumbar MRI which was essentially negative.  She did have an MRI of her right hip done although she does not have the disc with her today.  She has a hard time sleeping directly on the side    Surgical History:   None  PMH/PSH/Family History/Social History/Meds/Allergies:    Past Medical History:  Diagnosis Date   Anemia    Diabetes mellitus without complication (HCC)    GM - Diet controlled - no meds   Hypertension    PIH with pregnancy   Sleep apnea    does not use CPAP - lost weight   SVD (spontaneous vaginal delivery)    x 2   Past Surgical History:  Procedure Laterality Date   CESAREAN SECTION  03/13/2010   CESAREAN SECTION WITH BILATERAL TUBAL LIGATION N/A 02/25/2014   Procedure: CESAREAN SECTION WITH BILATERAL TUBAL LIGATION;  Surgeon: Levie Heritage, DO;  Location: [facility];  Service: Obstetrics;  Laterality: N/A;   TOOTH EXTRACTION     Social History   Socioeconomic History   Marital status: Single    Spouse name: Not on  file   Number of children: Not on file   Years of education: Not on file   Highest education level: GED or equivalent  Occupational History   Not on file  Tobacco Use   Smoking status: Former    Current packs/day: 0.00    Average packs/day: 0.3 packs/day for 10.0 years (2.5 ttl pk-yrs)    Types: Cigarettes    Start date: 06/13/1998    Quit date: 06/13/2008    Years since quitting: 14.8   Smokeless tobacco: Never  Substance and Sexual Activity   Alcohol use: Yes   Drug use: No   Sexual activity: Yes    Birth control/protection: None  Other Topics Concern   Not on file  Social History Narrative   Not on file   Social Determinants of Health   Financial Resource Strain: Medium Risk (10/14/2022)   Overall Financial Resource Strain (CARDIA)    Difficulty of Paying Living Expenses: Somewhat hard  Food Insecurity: No Food Insecurity (10/14/2022)   Hunger Vital Sign    Worried About Running Out of Food in the Last Year: Never true  Ran Out of Food in the Last Year: Never true  Transportation Needs: No Transportation Needs (10/14/2022)   PRAPARE - Administrator, Civil Service (Medical): No    Lack of Transportation (Non-Medical): No  Physical Activity: Unknown (10/14/2022)   Exercise Vital Sign    Days of Exercise per Week: 7 days    Minutes of Exercise per Session: Patient declined  Stress: No Stress Concern Present (10/14/2022)   [employer] of Occupational Health - Occupational Stress Questionnaire    Feeling of Stress : Not at all  Social Connections: Moderately Isolated (10/14/2022)   Social Connection and Isolation Panel [NHANES]    Frequency of Communication with Friends and Family: Once a week    Frequency of Social Gatherings with Friends and Family: Once a week    Attends Religious Services: 1 to 4 times per year    Active Member of [employer] or Organizations: No    Attends Engineer, structural: Not on file    Marital Status: Married   No family history  on file. Allergies  Allergen Reactions   Codeine Anaphylaxis and Swelling    Puffy eyes, pt can take Percocet   Current Outpatient Medications  Medication Sig Dispense Refill   Blood Glucose Monitoring Suppl DEVI 1 each by Does not apply route in the morning, at noon, and at bedtime. May substitute to any manufacturer covered by patient's insurance. 1 each 0   diclofenac (VOLTAREN) 50 MG EC tablet Take 1 tablet (50 mg total) by mouth 2 (two) times daily. 14 tablet 0   metFORMIN (GLUCOPHAGE-XR) 500 MG 24 hr tablet Take 1 tablet (500 mg total) by mouth 2 (two) times daily with a meal. 60 tablet 2   methocarbamol (ROBAXIN) 500 MG tablet Take 1 tablet (500 mg total) by mouth 2 (two) times daily. 20 tablet 0   omeprazole (PRILOSEC) 20 MG capsule Take 1 capsule (20 mg total) by mouth daily. 30 capsule 0   predniSONE (STERAPRED UNI-PAK 21 TAB) 10 MG (21) TBPK tablet Take by mouth daily. Take 6 tabs by mouth daily  for 2 days, then 5 tabs for 2 days, then 4 tabs for 2 days, then 3 tabs for 2 days, 2 tabs for 2 days, then 1 tab by mouth daily for 2 days 42 tablet 0   Prenatal Vit-Fe Fumarate-FA (PRENATAL MULTIVITAMIN) TABS tablet Take 1 tablet by mouth daily at 12 noon. (Patient not taking: Reported on 10/14/2022)     rosuvastatin (CRESTOR) 10 MG tablet Take 1 tablet (10 mg total) by mouth daily. 30 tablet 11   No current facility-administered medications for this visit.   No results found.  Review of Systems:   A ROS was performed including pertinent positives and negatives as documented in the HPI.  Physical Exam :   Constitutional: NAD and appears stated age Neurological: Alert and oriented Psych: Appropriate affect and cooperative There were no vitals taken for this visit.   Comprehensive Musculoskeletal Exam:    Inspection Right Left  Skin No atrophy or gross abnormalities appreciated No atrophy or gross abnormalities appreciated  Palpation    Tenderness Greater trochanter None   Crepitus None None  Range of Motion    Flexion (passive) 120 120  Extension 30 30  IR 30 30  ER 45 45  Strength    Flexion  5/5 5/5  Extension 5/5 5/5  Special Tests    FABER Negative Negative  FADIR Negative Negative  ER Lag/Capsular Insufficiency Negative  Negative  Instability Negative Negative  Sacroiliac pain Negative  Negative   Instability    Generalized Laxity No No  Neurologic    sciatic, femoral, obturator nerves intact to light sensation  Vascular/Lymphatic    DP pulse 2+ 2+  Lumbar Exam    Patient has symmetric lumbar range of motion with negative pain referral to hip    Positive pain about bilateral SI joints.  Imaging:   Xray (AP pelvis, right hip 2 views): Normal  MRI pelvis, lumbar spine: There is tearing of the gluteus minimus and medius  I personally reviewed and interpreted the radiographs.   Assessment:   42 y.o. 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  At this time she has having a very difficult time getting through work and going up and down stairs.  She did get extremely good relief from her gluteal injection.  Overall I do believe that this is the core of her problem.  We did discuss the ultimately I would like her to start with physical therapy regimen for strengthening of the core and hip.  Her right SI joint has been somewhat problematic for her and to that effect I would like to plan to refer her to Dr. Shon Baton for right SI injection in preparation for working with physical therapy and strengthening of the hip  At this point I would like her to begin physical therapy for her back and SI as well as the right hip for strengthening of this.  Will plan to refer her to the [facility].  I will also plan to send her to  Plan :    -Return to in 6 weeks for reassessment     I personally saw and evaluated the patient, and participated in the management and treatment plan.  Huel Cote, MD Attending  Physician, Orthopedic Surgery  This document was dictated using Dragon voice recognition software. A reasonable attempt at proof reading has been made to minimize errors.

## 2023-04-12 ENCOUNTER — Encounter (HOSPITAL_COMMUNITY): Payer: Self-pay

## 2023-04-21 ENCOUNTER — Ambulatory Visit: Payer: Self-pay | Admitting: Nurse Practitioner

## 2023-04-25 ENCOUNTER — Ambulatory Visit: Payer: Self-pay | Admitting: Sports Medicine

## 2023-05-02 ENCOUNTER — Ambulatory Visit: Payer: Self-pay | Admitting: Nurse Practitioner

## 2023-05-15 ENCOUNTER — Other Ambulatory Visit: Payer: Self-pay

## 2023-05-15 ENCOUNTER — Ambulatory Visit (INDEPENDENT_AMBULATORY_CARE_PROVIDER_SITE_OTHER): Payer: Self-pay | Admitting: Sports Medicine

## 2023-05-15 ENCOUNTER — Encounter: Payer: Self-pay | Admitting: Sports Medicine

## 2023-05-15 DIAGNOSIS — M7918 Myalgia, other site: Secondary | ICD-10-CM

## 2023-05-15 DIAGNOSIS — M533 Sacrococcygeal disorders, not elsewhere classified: Secondary | ICD-10-CM

## 2023-05-15 DIAGNOSIS — G8929 Other chronic pain: Secondary | ICD-10-CM

## 2023-05-15 DIAGNOSIS — E119 Type 2 diabetes mellitus without complications: Secondary | ICD-10-CM

## 2023-05-15 NOTE — Progress Notes (Addendum)
   Office & Procedure Note  Patient: Caitlyn [name]             Date of Birth: 11-Oct-1980           MRN: [id number]             Visit Date: 05/15/2023  HPI: [name] is a pleasant 42 year old female who presents with right SI joint and low back/hip pain.  Has been seen by my partner, Dr. Steward Drone.  Back in July she had an ultrasound-guided greater troche/gluteus injection to help the side of her pain, her pain is more so over the right SI joint and low back.  She has been starting formalized physical therapy as well.  Does take ibuprofen but is not significantly helpful.  She is aware she has uncontrolled diabetes.  When she had her previous injection back in July, she states she had no side effects or issues with this.  She is working with her primary care physician on managing this.  Lab Results  Component Value Date   HGBA1C 13.7 (A) 01/12/2023   PE:  - SI Joint: + TTP over the right SI joint just distal to the PSIS.  Positive Fortin's point test and SI joint compression test.  Negative SLR.  Visit Diagnoses:  1. Chronic right SI joint pain   2. Pain in right buttock   3. Type 2 diabetes mellitus without complication, without long-term current use of insulin (HCC)    Procedures:  U/S-guided SI-joint injection, Right   After discussion of risk/benefits/indications, informed verbal consent was obtained. A timeout was then performed. The patient was positioned in a prone position on exam room table with a pillow placed under the pelvis for mild hip flexion. The SI joint area was cleaned and prepped with betadine and alcohol swabs. Sterile ultrasound gel was applied and the ultrasound transducer was placed in an anatomic axial plane over the PSIS, then moved distally over the SI-joint. Using ultrasound guidance, a 22-gauge, 3.5" needle was inserted from a medial to lateral approach utilizing an in-plane approach and directed into the SI-joint. The SI-joint was then injected with a mixture of 4:1  lidocaine:depomedrol (20mg ) with visualization of the injectate flow into the SI-joint under ultrasound visualization. The patient tolerated the procedure well without immediate complications.  *Ultrasound was used for needle guidance, there was no charge associated with this today  Plan:  -Through shared decision making, elected to proceed with ultrasound-guided right SI joint injection, patient tolerated well. -Discussed modified rest/activity for the next 48 hours, may use ice, over-the-counter anti-inflammatories as needed -Did discuss transient glucose elevation, she will check her sugars, increase water intake and modify diet as appropriate.  She will continue follow-up with her PCP for strict control. Return precautions provided -She will continue her formalized physical therapy and does have follow-up with Dr. Fraser Din, DO Primary Care Sports Medicine Physician  [facility]  This note was dictated using Dragon naturally speaking software and may contain errors in syntax, spelling, or content which have not been identified prior to signing this note.

## 2023-06-16 ENCOUNTER — Ambulatory Visit (HOSPITAL_BASED_OUTPATIENT_CLINIC_OR_DEPARTMENT_OTHER): Payer: Self-pay | Admitting: Orthopaedic Surgery

## 2023-07-03 ENCOUNTER — Ambulatory Visit (HOSPITAL_BASED_OUTPATIENT_CLINIC_OR_DEPARTMENT_OTHER): Payer: Self-pay | Admitting: Orthopaedic Surgery

## 2023-07-03 DIAGNOSIS — M25351 Other instability, right hip: Secondary | ICD-10-CM

## 2023-07-03 NOTE — Progress Notes (Signed)
Chief Complaint: Right hip pain     History of Present Illness:   07/03/2023: Presents today for follow-up of the right hip.  She has still been having a very difficult time laying on the side or being active.  She did get some relief from an SI injection with Dr. Shon Baton.   Caitlyn [name] is a 43 y.o. female presents with ongoing right hip pain for the last several years.  She does have a history of lower back pain which she has not been able to treat.  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  With regard to the right hip she states that this has been lateral based although there is some radiation to the distal thigh.  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  She has previously seen an outside surgeon for discussion with her lumbar MRI which was essentially negative.  She did have an MRI of her right hip done although she does not have the disc with her today.  She has a hard time sleeping directly on the side    Surgical History:   None  PMH/PSH/Family History/Social History/Meds/Allergies:    Past Medical History:  Diagnosis Date   Anemia    Diabetes mellitus without complication (HCC)    GM - Diet controlled - no meds   Hypertension    PIH with pregnancy   Sleep apnea    does not use CPAP - lost weight   SVD (spontaneous vaginal delivery)    x 2   Past Surgical History:  Procedure Laterality Date   CESAREAN SECTION  03/13/2010   CESAREAN SECTION WITH BILATERAL TUBAL LIGATION N/A 02/25/2014   Procedure: CESAREAN SECTION WITH BILATERAL TUBAL LIGATION;  Surgeon: Levie Heritage, DO;  Location: [facility];  Service: Obstetrics;  Laterality: N/A;   TOOTH EXTRACTION     Social History   Socioeconomic History   Marital status: Single    Spouse name: Not on file   Number of children: Not on file   Years of education: Not on file   Highest education level: GED or  equivalent  Occupational History   Not on file  Tobacco Use   Smoking status: Former    Current packs/day: 0.00    Average packs/day: 0.3 packs/day for 10.0 years (2.5 ttl pk-yrs)    Types: Cigarettes    Start date: 06/13/1998    Quit date: 06/13/2008    Years since quitting: 15.0   Smokeless tobacco: Never  Substance and Sexual Activity   Alcohol use: Yes   Drug use: No   Sexual activity: Yes    Birth control/protection: None  Other Topics Concern   Not on file  Social History Narrative   Not on file   Social Drivers of Health   Financial Resource Strain: Medium Risk (10/14/2022)   Overall Financial Resource Strain (CARDIA)    Difficulty of Paying Living Expenses: Somewhat hard  Food Insecurity: No Food Insecurity (10/14/2022)   Hunger Vital Sign    Worried About Running Out of Food in the Last Year: Never true    Ran Out of Food in the Last Year: Never true  Transportation Needs: No Transportation Needs (10/14/2022)  PRAPARE - Administrator, Civil Service (Medical): No    Lack of Transportation (Non-Medical): No  Physical Activity: Unknown (10/14/2022)   Exercise Vital Sign    Days of Exercise per Week: 7 days    Minutes of Exercise per Session: Patient declined  Stress: No Stress Concern Present (10/14/2022)   [employer] of Occupational Health - Occupational Stress Questionnaire    Feeling of Stress : Not at all  Social Connections: Moderately Isolated (10/14/2022)   Social Connection and Isolation Panel [NHANES]    Frequency of Communication with Friends and Family: Once a week    Frequency of Social Gatherings with Friends and Family: Once a week    Attends Religious Services: 1 to 4 times per year    Active Member of [employer] or Organizations: No    Attends Engineer, structural: Not on file    Marital Status: Married   No family history on file. Allergies  Allergen Reactions   Codeine Anaphylaxis and Swelling    Puffy eyes, pt can take Percocet    Current Outpatient Medications  Medication Sig Dispense Refill   Blood Glucose Monitoring Suppl DEVI 1 each by Does not apply route in the morning, at noon, and at bedtime. May substitute to any manufacturer covered by patient's insurance. 1 each 0   diclofenac (VOLTAREN) 50 MG EC tablet Take 1 tablet (50 mg total) by mouth 2 (two) times daily. 14 tablet 0   metFORMIN (GLUCOPHAGE-XR) 500 MG 24 hr tablet Take 1 tablet (500 mg total) by mouth 2 (two) times daily with a meal. 60 tablet 2   methocarbamol (ROBAXIN) 500 MG tablet Take 1 tablet (500 mg total) by mouth 2 (two) times daily. 20 tablet 0   omeprazole (PRILOSEC) 20 MG capsule Take 1 capsule (20 mg total) by mouth daily. 30 capsule 0   predniSONE (STERAPRED UNI-PAK 21 TAB) 10 MG (21) TBPK tablet Take by mouth daily. Take 6 tabs by mouth daily  for 2 days, then 5 tabs for 2 days, then 4 tabs for 2 days, then 3 tabs for 2 days, 2 tabs for 2 days, then 1 tab by mouth daily for 2 days 42 tablet 0   Prenatal Vit-Fe Fumarate-FA (PRENATAL MULTIVITAMIN) TABS tablet Take 1 tablet by mouth daily at 12 noon. (Patient not taking: Reported on 10/14/2022)     rosuvastatin (CRESTOR) 10 MG tablet Take 1 tablet (10 mg total) by mouth daily. 30 tablet 11   No current facility-administered medications for this visit.   No results found.  Review of Systems:   A ROS was performed including pertinent positives and negatives as documented in the HPI.  Physical Exam :   Constitutional: NAD and appears stated age Neurological: Alert and oriented Psych: Appropriate affect and cooperative There were no vitals taken for this visit.   Comprehensive Musculoskeletal Exam:    Inspection Right Left  Skin No atrophy or gross abnormalities appreciated No atrophy or gross abnormalities appreciated  Palpation    Tenderness Greater trochanter None  Crepitus None None  Range of Motion    Flexion (passive) 120 120  Extension 30 30  IR 30 30  ER 45 45  Strength     Flexion  5/5 5/5  Extension 5/5 5/5  Special Tests    FABER Negative Negative  FADIR Positive Negative  ER Lag/Capsular Insufficiency Negative Negative  Instability Negative Negative  Sacroiliac pain Negative  Negative   Instability    Generalized Laxity  No No  Neurologic    sciatic, femoral, obturator nerves intact to light sensation  Vascular/Lymphatic    DP pulse 2+ 2+  Lumbar Exam    Patient has symmetric lumbar range of motion with negative pain referral to hip    Positive pain about bilateral SI joints.  Imaging:   Xray (AP pelvis, right hip 2 views): Normal with mild acetabular undercoverage and elevated tonnis angle  MRI pelvis, lumbar spine: There is tendinopathy of the gluteus minimus and medius with labral tearing  I personally reviewed and interpreted the radiographs.   Assessment:   43 y.o. female with longstanding right hip pain in the setting of multiple foci of pain including gluteus tendinopathy and SI type pain.  At this time she feels like her worst pain is deep in the posterior buttocks in the groin. It does wrap around in a C-shaped distribution.  Overall given the fact that she has had now multiple injections without any relief, I did discuss additional treatment options.  To me her hip appears to be unstable and as result this is causing knee pain and back pain.  She has not been able to get any relief from any activity restrictions, medication, injections.  Given this we did discuss the role for possible right hip arthroscopy with labral repair and capsular imbrication.  I did discuss the specific limitations as well as restrictions associated with this.  I did discuss the recovery timeframe. Plan :    -Plan for right hip arthroscopy with labral repair   After a lengthy discussion of treatment options, including risks, benefits, alternatives, complications of surgical and nonsurgical conservative options, the patient elected surgical repair.   The  patient  is aware of the material risks  and complications including, but not limited to injury to adjacent structures, neurovascular injury, infection, numbness, bleeding, implant failure, thermal burns, stiffness, persistent pain, failure to heal, disease transmission from allograft, need for further surgery, dislocation, anesthetic risks, blood clots, risks of death,and others. The probabilities of surgical success and failure discussed with patient given their particular co-morbidities.The time and nature of expected rehabilitation and recovery was discussed.The patient's questions were all answered preoperatively.  No barriers to understanding were noted. I explained the natural history of the disease process and Rx rationale.  I explained to the patient what I considered to be reasonable expectations given their personal situation.  The final treatment plan was arrived at through a shared patient decision making process model.      I personally saw and evaluated the patient, and participated in the management and treatment plan.  Huel Cote, MD Attending Physician, Orthopedic Surgery  This document was dictated using Dragon voice recognition software. A reasonable attempt at proof reading has been made to minimize errors.

## 2023-07-21 ENCOUNTER — Ambulatory Visit (HOSPITAL_BASED_OUTPATIENT_CLINIC_OR_DEPARTMENT_OTHER): Payer: Self-pay | Admitting: Physical Therapy

## 2023-08-14 ENCOUNTER — Ambulatory Visit (HOSPITAL_BASED_OUTPATIENT_CLINIC_OR_DEPARTMENT_OTHER): Payer: Self-pay | Attending: Orthopaedic Surgery | Admitting: Physical Therapy

## 2023-12-22 ENCOUNTER — Ambulatory Visit
Admission: EM | Admit: 2023-12-22 | Discharge: 2023-12-22 | Disposition: A | Discharge status: Home / Self Care | Attending: Family Medicine | Admitting: Family Medicine

## 2023-12-22 DIAGNOSIS — K047 Periapical abscess without sinus: Secondary | ICD-10-CM

## 2023-12-22 MED ORDER — AMOXICILLIN-POT CLAVULANATE 875-125 MG PO TABS: 1.0000 | ORAL_TABLET | Freq: Two times a day (BID) | ORAL | 0 refills | Status: DC

## 2023-12-22 MED ORDER — LIDOCAINE VISCOUS HCL 2 % MT SOLN: 10.0000 mL | OROMUCOSAL | 0 refills | Status: DC | PRN

## 2023-12-22 MED ORDER — CHLORHEXIDINE GLUCONATE 0.12 % MT SOLN: 15.0000 mL | Freq: Two times a day (BID) | OROMUCOSAL | 0 refills | Status: DC

## 2023-12-22 NOTE — ED Triage Notes (Signed)
 Pt states left sided dental pain and swelling for the past month. States it hurts when she chews.  States she has an impacted wisdom tooth on that side but does not have any dental insurance at this time to get it taken care of.  States she has been taking tylenol  and motrin  at home for the pain.

## 2023-12-26 NOTE — ED Provider Notes (Signed)
 [facility]    CSN: [id number] Arrival date & time: 12/22/23  0805      History   Chief Complaint Chief Complaint  Patient presents with   Dental Pain    HPI Caitlyn [name] is a 43 y.o. female.   Presenting today with left sided dental pain progressively worsening x 1 month. Known dental issue in this area, awaiting ability to get in with dentist. Denies fever, chills, difficulty breathing or swallowing, N/V but does note drainage and bleeding to the area. So far trying OTC pain relievers with minimal relief.     Past Medical History:  Diagnosis Date   Anemia    Diabetes mellitus without complication (HCC)    GM - Diet controlled - no meds   Hypertension    PIH with pregnancy   Sleep apnea    does not use CPAP - lost weight   SVD (spontaneous vaginal delivery)    x 2    Patient Active Problem List   Diagnosis Date Noted   Type 2 diabetes mellitus without complication, without long-term current use of insulin  (HCC) 01/12/2023   Right hip pain 10/19/2022   Status post cesarean delivery 02/25/2014   Fetal macrosomia in pregnancy in third trimester 02/20/2014   Hypertension in pregnancy, pre-existing, antepartum 10/14/2013   Diabetes mellitus (Class B), antepartum 09/10/2013   Hx of preeclampsia, prior pregnancy, currently pregnant 08/21/2013   Previous cesarean section complicating pregnancy 08/21/2013   Hypertension 08/21/2013   Obesity complicating pregnancy in first trimester 08/21/2013   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    Past Surgical History:  Procedure Laterality Date   CESAREAN SECTION  03/13/2010   CESAREAN SECTION WITH BILATERAL TUBAL LIGATION N/A 02/25/2014   Procedure: CESAREAN SECTION WITH BILATERAL TUBAL LIGATION;  Surgeon: Lang JINNY Peel, DO;  Location: [facility];  Service: Obstetrics;  Laterality: N/A;   TOOTH EXTRACTION      OB History     Gravida  5   Para  4   Term  4   Preterm      AB  1   Living  4      SAB  1   IAB       Ectopic      Multiple      Live Births  4            Home Medications    Prior to Admission medications   Medication Sig Start Date End Date Taking? Authorizing Provider  amoxicillin -clavulanate (AUGMENTIN ) 875-125 MG tablet Take 1 tablet by mouth every 12 (twelve) hours. 12/22/23  Yes Stuart Vernell Norris, PA-C  chlorhexidine  (PERIDEX ) 0.12 % solution Use as directed 15 mLs in the mouth or throat 2 (two) times daily. 12/22/23  Yes Stuart Vernell Norris, PA-C  lidocaine  (XYLOCAINE ) 2 % solution Use as directed 10 mLs in the mouth or throat every 3 (three) hours as needed. 12/22/23  Yes Stuart Vernell Norris, PA-C  Blood Glucose Monitoring Suppl DEVI 1 each by Does not apply route in the morning, at noon, and at bedtime. May substitute to any manufacturer covered by patient's insurance. 01/12/23   Oley Bascom RAMAN, NP  diclofenac  (VOLTAREN ) 50 MG EC tablet Take 1 tablet (50 mg total) by mouth 2 (two) times daily. 09/27/22   Beverley Leita LABOR, PA-C  metFORMIN  (GLUCOPHAGE -XR) 500 MG 24 hr tablet Take 1 tablet (500 mg total) by mouth 2 (two) times daily with a meal. 10/14/22   Oley Bascom RAMAN, NP  methocarbamol  (ROBAXIN )  500 MG tablet Take 1 tablet (500 mg total) by mouth 2 (two) times daily. 09/27/22   Beverley Leita LABOR, PA-C  omeprazole  (PRILOSEC) 20 MG capsule Take 1 capsule (20 mg total) by mouth daily. 09/27/22   Beverley Leita LABOR, PA-C  predniSONE  (STERAPRED UNI-PAK 21 TAB) 10 MG (21) TBPK tablet Take by mouth daily. Take 6 tabs by mouth daily  for 2 days, then 5 tabs for 2 days, then 4 tabs for 2 days, then 3 tabs for 2 days, 2 tabs for 2 days, then 1 tab by mouth daily for 2 days 09/27/22   Beverley Leita LABOR, PA-C  Prenatal Vit-Fe Fumarate-FA (PRENATAL MULTIVITAMIN) TABS tablet Take 1 tablet by mouth daily at 12 noon. Patient not taking: Reported on 10/14/2022    [provider]  rosuvastatin  (CRESTOR ) 10 MG tablet Take 1 tablet (10 mg total) by mouth daily. 01/13/23 01/13/24  Oley Bascom RAMAN,  NP    Family History History reviewed. No pertinent family history.  Social History Social History   Tobacco Use   Smoking status: Former    Current packs/day: 0.00    Average packs/day: 0.3 packs/day for 10.0 years (2.5 ttl pk-yrs)    Types: Cigarettes    Start date: 06/13/1998    Quit date: 06/13/2008    Years since quitting: 15.5   Smokeless tobacco: Never  Substance Use Topics   Alcohol use: Yes   Drug use: No     Allergies   Codeine   Review of Systems Review of Systems PER HPI  Physical Exam Triage Vital Signs ED Triage Vitals  Encounter Vitals Group     BP 12/22/23 0830 (!) 179/132     Girls Systolic BP Percentile --      Girls Diastolic BP Percentile --      Boys Systolic BP Percentile --      Boys Diastolic BP Percentile --      Pulse Rate 12/22/23 0830 85     Resp 12/22/23 0830 16     Temp 12/22/23 0830 98.5 F (36.9 C)     Temp Source 12/22/23 0830 Oral     SpO2 12/22/23 0830 98 %     Weight --      Height --      Head Circumference --      Peak Flow --      Pain Score 12/22/23 0831 10     Pain Loc --      Pain Education --      Exclude from Growth Chart --    No data found.  Updated Vital Signs BP (!) 179/132 (BP Location: Right Arm)   Pulse 85   Temp 98.5 F (36.9 C) (Oral)   Resp 16   LMP 12/16/2023 (Approximate)   SpO2 98%   Visual Acuity Right Eye Distance:   Left Eye Distance:   Bilateral Distance:    Right Eye Near:   Left Eye Near:    Bilateral Near:     Physical Exam Vitals and nursing note reviewed.  Constitutional:      Appearance: Normal appearance. She is not ill-appearing.  HENT:     Head: Atraumatic.     Mouth/Throat:     Mouth: Mucous membranes are moist.     Pharynx: Oropharynx is clear.     Comments: Gingival erythema and edema without active drainage, bleeding to left lower jaw Eyes:     Extraocular Movements: Extraocular movements intact.     Conjunctiva/sclera: Conjunctivae normal.  Cardiovascular:      Rate and Rhythm: Normal rate.  Pulmonary:     Effort: Pulmonary effort is normal.  Musculoskeletal:        General: Normal range of motion.     Cervical back: Normal range of motion and neck supple.  Skin:    General: Skin is warm and dry.  Neurological:     Mental Status: She is alert and oriented to person, place, and time.  Psychiatric:        Mood and Affect: Mood normal.        Thought Content: Thought content normal.        Judgment: Judgment normal.      UC Treatments / Results  Labs (all labs ordered are listed, but only abnormal results are displayed) Labs Reviewed - No data to display  EKG   Radiology No results found.  Procedures Procedures (including critical care time)  Medications Ordered in UC Medications - No data to display  Initial Impression / Assessment and Plan / UC Course  I have reviewed the triage vital signs and the nursing notes.  Pertinent labs & imaging results that were available during my care of the patient were reviewed by me and considered in my medical decision making (see chart for details).     Treat dental infection with augmentin , viscous lidocaine , supportive OTC medications and home care. Return for worsening sxs.  Final Clinical Impressions(s) / UC Diagnoses   Final diagnoses:  Dental infection   Discharge Instructions   None    ED Prescriptions     Medication Sig Dispense Auth. Provider   amoxicillin -clavulanate (AUGMENTIN ) 875-125 MG tablet Take 1 tablet by mouth every 12 (twelve) hours. 14 tablet Stuart Vernell Norris, PA-C   chlorhexidine  (PERIDEX ) 0.12 % solution Use as directed 15 mLs in the mouth or throat 2 (two) times daily. 120 mL Stuart Vernell Norris, PA-C   lidocaine  (XYLOCAINE ) 2 % solution Use as directed 10 mLs in the mouth or throat every 3 (three) hours as needed. 100 mL Stuart Vernell Norris, [place]      PDMP not reviewed this encounter.   Stuart Vernell Norris, PA-C 12/26/23 2258

## 2024-02-15 ENCOUNTER — Telehealth: Payer: Self-pay | Admitting: Nurse Practitioner

## 2024-02-15 NOTE — Telephone Encounter (Signed)
 Patient was identified as falling into the [place] Measure - Diabetes.   Patient was: Left voicemail to schedule with primary care provider.   Pt needs an appointment with PCP if she still wishes to use this practice

## 2024-03-01 ENCOUNTER — Telehealth: Payer: Self-pay | Admitting: Nurse Practitioner

## 2024-03-01 NOTE — Telephone Encounter (Signed)
 Patient was identified as falling into the [place] Measure - Diabetes.   Patient was: Left voicemail to schedule with primary care provider.  Sent mychart message to remind pt to make a PCP appt 9.19.25

## 2024-04-15 ENCOUNTER — Encounter: Payer: Self-pay | Admitting: Radiology

## 2024-04-24 ENCOUNTER — Telehealth: Payer: Self-pay | Admitting: Nurse Practitioner

## 2024-04-24 NOTE — Telephone Encounter (Signed)
 Patient was identified as falling into the [place] Measure - Diabetes.   Patient was: Left voicemail to schedule with primary care provider.  - Sent MyChart msg to schedule appt

## 2024-05-27 ENCOUNTER — Emergency Department (HOSPITAL_COMMUNITY)

## 2024-05-27 ENCOUNTER — Inpatient Hospital Stay (HOSPITAL_COMMUNITY)
Admission: EM | Admit: 2024-05-27 | Discharge: 2024-06-08 | DRG: 958 | Disposition: A | Discharge status: Rehabilitation Facility

## 2024-05-27 ENCOUNTER — Emergency Department (HOSPITAL_COMMUNITY): Admitting: Anesthesiology

## 2024-05-27 ENCOUNTER — Encounter (HOSPITAL_COMMUNITY): Admission: EM | Disposition: A | Discharge status: Rehabilitation Facility | Payer: Self-pay | Source: Home / Self Care

## 2024-05-27 ENCOUNTER — Encounter (HOSPITAL_COMMUNITY): Payer: Self-pay | Admitting: Anesthesiology

## 2024-05-27 ENCOUNTER — Other Ambulatory Visit: Payer: Self-pay

## 2024-05-27 DIAGNOSIS — S73015A Posterior dislocation of left hip, initial encounter: Principal | ICD-10-CM | POA: Diagnosis present

## 2024-05-27 DIAGNOSIS — I10 Essential (primary) hypertension: Secondary | ICD-10-CM | POA: Diagnosis present

## 2024-05-27 DIAGNOSIS — Z23 Encounter for immunization: Secondary | ICD-10-CM

## 2024-05-27 DIAGNOSIS — E669 Obesity, unspecified: Secondary | ICD-10-CM | POA: Diagnosis present

## 2024-05-27 DIAGNOSIS — S72002A Fracture of unspecified part of neck of left femur, initial encounter for closed fracture: Secondary | ICD-10-CM | POA: Diagnosis present

## 2024-05-27 DIAGNOSIS — Z87891 Personal history of nicotine dependence: Secondary | ICD-10-CM

## 2024-05-27 DIAGNOSIS — R000 Tachycardia, unspecified: Secondary | ICD-10-CM | POA: Diagnosis present

## 2024-05-27 DIAGNOSIS — S82872B Displaced pilon fracture of left tibia, initial encounter for open fracture type I or II: Secondary | ICD-10-CM

## 2024-05-27 DIAGNOSIS — M25511 Pain in right shoulder: Secondary | ICD-10-CM | POA: Diagnosis not present

## 2024-05-27 DIAGNOSIS — M79674 Pain in right toe(s): Secondary | ICD-10-CM | POA: Diagnosis present

## 2024-05-27 DIAGNOSIS — Z9889 Other specified postprocedural states: Secondary | ICD-10-CM | POA: Diagnosis not present

## 2024-05-27 DIAGNOSIS — M549 Dorsalgia, unspecified: Secondary | ICD-10-CM | POA: Diagnosis present

## 2024-05-27 DIAGNOSIS — Y9339 Activity, other involving climbing, rappelling and jumping off: Secondary | ICD-10-CM

## 2024-05-27 DIAGNOSIS — R7989 Other specified abnormal findings of blood chemistry: Secondary | ICD-10-CM | POA: Diagnosis not present

## 2024-05-27 DIAGNOSIS — S329XXA Fracture of unspecified parts of lumbosacral spine and pelvis, initial encounter for closed fracture: Secondary | ICD-10-CM | POA: Diagnosis present

## 2024-05-27 DIAGNOSIS — K5901 Slow transit constipation: Secondary | ICD-10-CM | POA: Diagnosis not present

## 2024-05-27 DIAGNOSIS — M19071 Primary osteoarthritis, right ankle and foot: Secondary | ICD-10-CM | POA: Diagnosis not present

## 2024-05-27 DIAGNOSIS — S82832B Other fracture of upper and lower end of left fibula, initial encounter for open fracture type I or II: Secondary | ICD-10-CM | POA: Diagnosis present

## 2024-05-27 DIAGNOSIS — F10129 Alcohol abuse with intoxication, unspecified: Secondary | ICD-10-CM | POA: Diagnosis present

## 2024-05-27 DIAGNOSIS — Z79899 Other long term (current) drug therapy: Secondary | ICD-10-CM

## 2024-05-27 DIAGNOSIS — F32A Depression, unspecified: Secondary | ICD-10-CM | POA: Diagnosis present

## 2024-05-27 DIAGNOSIS — R413 Other amnesia: Secondary | ICD-10-CM | POA: Diagnosis present

## 2024-05-27 DIAGNOSIS — S82892B Other fracture of left lower leg, initial encounter for open fracture type I or II: Secondary | ICD-10-CM

## 2024-05-27 DIAGNOSIS — S32455A Nondisplaced transverse fracture of left acetabulum, initial encounter for closed fracture: Secondary | ICD-10-CM | POA: Diagnosis present

## 2024-05-27 DIAGNOSIS — S82452A Displaced comminuted fracture of shaft of left fibula, initial encounter for closed fracture: Secondary | ICD-10-CM | POA: Diagnosis not present

## 2024-05-27 DIAGNOSIS — S82892A Other fracture of left lower leg, initial encounter for closed fracture: Secondary | ICD-10-CM | POA: Diagnosis not present

## 2024-05-27 DIAGNOSIS — Y906 Blood alcohol level of 120-199 mg/100 ml: Secondary | ICD-10-CM | POA: Diagnosis present

## 2024-05-27 DIAGNOSIS — F4311 Post-traumatic stress disorder, acute: Secondary | ICD-10-CM | POA: Diagnosis not present

## 2024-05-27 DIAGNOSIS — S2242XA Multiple fractures of ribs, left side, initial encounter for closed fracture: Secondary | ICD-10-CM | POA: Diagnosis not present

## 2024-05-27 DIAGNOSIS — S32402A Unspecified fracture of left acetabulum, initial encounter for closed fracture: Secondary | ICD-10-CM

## 2024-05-27 DIAGNOSIS — Z6833 Body mass index (BMI) 33.0-33.9, adult: Secondary | ICD-10-CM

## 2024-05-27 DIAGNOSIS — F419 Anxiety disorder, unspecified: Secondary | ICD-10-CM | POA: Diagnosis present

## 2024-05-27 DIAGNOSIS — S73005A Unspecified dislocation of left hip, initial encounter: Secondary | ICD-10-CM

## 2024-05-27 DIAGNOSIS — E119 Type 2 diabetes mellitus without complications: Secondary | ICD-10-CM | POA: Diagnosis present

## 2024-05-27 DIAGNOSIS — Z885 Allergy status to narcotic agent status: Secondary | ICD-10-CM | POA: Diagnosis not present

## 2024-05-27 DIAGNOSIS — S82832A Other fracture of upper and lower end of left fibula, initial encounter for closed fracture: Secondary | ICD-10-CM | POA: Diagnosis not present

## 2024-05-27 DIAGNOSIS — S199XXA Unspecified injury of neck, initial encounter: Secondary | ICD-10-CM | POA: Diagnosis not present

## 2024-05-27 DIAGNOSIS — S8252XA Displaced fracture of medial malleolus of left tibia, initial encounter for closed fracture: Secondary | ICD-10-CM | POA: Diagnosis present

## 2024-05-27 DIAGNOSIS — M7989 Other specified soft tissue disorders: Secondary | ICD-10-CM | POA: Diagnosis not present

## 2024-05-27 DIAGNOSIS — F10929 Alcohol use, unspecified with intoxication, unspecified: Secondary | ICD-10-CM

## 2024-05-27 DIAGNOSIS — K5900 Constipation, unspecified: Secondary | ICD-10-CM | POA: Diagnosis present

## 2024-05-27 DIAGNOSIS — E785 Hyperlipidemia, unspecified: Secondary | ICD-10-CM | POA: Diagnosis present

## 2024-05-27 DIAGNOSIS — G8929 Other chronic pain: Secondary | ICD-10-CM | POA: Diagnosis present

## 2024-05-27 DIAGNOSIS — S82302A Unspecified fracture of lower end of left tibia, initial encounter for closed fracture: Secondary | ICD-10-CM | POA: Diagnosis not present

## 2024-05-27 DIAGNOSIS — Z7984 Long term (current) use of oral hypoglycemic drugs: Secondary | ICD-10-CM

## 2024-05-27 DIAGNOSIS — S32422A Displaced fracture of posterior wall of left acetabulum, initial encounter for closed fracture: Secondary | ICD-10-CM | POA: Diagnosis present

## 2024-05-27 DIAGNOSIS — E1165 Type 2 diabetes mellitus with hyperglycemia: Secondary | ICD-10-CM | POA: Diagnosis not present

## 2024-05-27 DIAGNOSIS — D62 Acute posthemorrhagic anemia: Secondary | ICD-10-CM | POA: Diagnosis present

## 2024-05-27 DIAGNOSIS — S82402A Unspecified fracture of shaft of left fibula, initial encounter for closed fracture: Secondary | ICD-10-CM | POA: Diagnosis not present

## 2024-05-27 DIAGNOSIS — M25552 Pain in left hip: Secondary | ICD-10-CM | POA: Diagnosis not present

## 2024-05-27 DIAGNOSIS — Z87892 Personal history of anaphylaxis: Secondary | ICD-10-CM

## 2024-05-27 DIAGNOSIS — S299XXA Unspecified injury of thorax, initial encounter: Secondary | ICD-10-CM | POA: Diagnosis not present

## 2024-05-27 DIAGNOSIS — S82422A Displaced transverse fracture of shaft of left fibula, initial encounter for closed fracture: Secondary | ICD-10-CM | POA: Diagnosis not present

## 2024-05-27 DIAGNOSIS — T07XXXA Unspecified multiple injuries, initial encounter: Secondary | ICD-10-CM | POA: Diagnosis not present

## 2024-05-27 DIAGNOSIS — M62830 Muscle spasm of back: Secondary | ICD-10-CM | POA: Diagnosis present

## 2024-05-27 DIAGNOSIS — R0989 Other specified symptoms and signs involving the circulatory and respiratory systems: Secondary | ICD-10-CM | POA: Diagnosis not present

## 2024-05-27 DIAGNOSIS — S32462A Displaced associated transverse-posterior fracture of left acetabulum, initial encounter for closed fracture: Secondary | ICD-10-CM | POA: Diagnosis not present

## 2024-05-27 DIAGNOSIS — W134XXA Fall from, out of or through window, initial encounter: Secondary | ICD-10-CM | POA: Diagnosis not present

## 2024-05-27 DIAGNOSIS — S82402E Unspecified fracture of shaft of left fibula, subsequent encounter for open fracture type I or II with routine healing: Secondary | ICD-10-CM | POA: Diagnosis not present

## 2024-05-27 DIAGNOSIS — S82392C Other fracture of lower end of left tibia, initial encounter for open fracture type IIIA, IIIB, or IIIC: Secondary | ICD-10-CM | POA: Diagnosis not present

## 2024-05-27 DIAGNOSIS — S0990XA Unspecified injury of head, initial encounter: Secondary | ICD-10-CM | POA: Diagnosis not present

## 2024-05-27 DIAGNOSIS — S32409D Unspecified fracture of unspecified acetabulum, subsequent encounter for fracture with routine healing: Secondary | ICD-10-CM | POA: Diagnosis not present

## 2024-05-27 DIAGNOSIS — S82302B Unspecified fracture of lower end of left tibia, initial encounter for open fracture type I or II: Secondary | ICD-10-CM | POA: Diagnosis not present

## 2024-05-27 DIAGNOSIS — G4733 Obstructive sleep apnea (adult) (pediatric): Secondary | ICD-10-CM | POA: Diagnosis present

## 2024-05-27 DIAGNOSIS — Z0189 Encounter for other specified special examinations: Secondary | ICD-10-CM | POA: Diagnosis not present

## 2024-05-27 DIAGNOSIS — S82202A Unspecified fracture of shaft of left tibia, initial encounter for closed fracture: Secondary | ICD-10-CM | POA: Diagnosis not present

## 2024-05-27 DIAGNOSIS — K219 Gastro-esophageal reflux disease without esophagitis: Secondary | ICD-10-CM | POA: Diagnosis present

## 2024-05-27 DIAGNOSIS — M25512 Pain in left shoulder: Secondary | ICD-10-CM | POA: Diagnosis not present

## 2024-05-27 DIAGNOSIS — Z794 Long term (current) use of insulin: Secondary | ICD-10-CM | POA: Diagnosis not present

## 2024-05-27 DIAGNOSIS — Z9189 Other specified personal risk factors, not elsewhere classified: Secondary | ICD-10-CM

## 2024-05-27 DIAGNOSIS — Z91410 Personal history of adult physical and sexual abuse: Secondary | ICD-10-CM

## 2024-05-27 HISTORY — PX: HIP CLOSED REDUCTION: SHX983

## 2024-05-27 HISTORY — PX: EXTERNAL FIXATION LEG: SHX1549

## 2024-05-27 HISTORY — PX: IRRIGATION AND DEBRIDEMENT POSTERIOR HIP: SHX7265

## 2024-05-27 LAB — SAMPLE TO BLOOD BANK

## 2024-05-27 LAB — COMPREHENSIVE METABOLIC PANEL WITH GFR
ALT: 49 U/L — ABNORMAL HIGH (ref 0–44)
AST: 116 U/L — ABNORMAL HIGH (ref 15–41)
Albumin: 3.7 g/dL (ref 3.5–5.0)
Alkaline Phosphatase: 42 U/L (ref 38–126)
Anion gap: 14 (ref 5–15)
BUN: 9 mg/dL (ref 6–20)
CO2: 21 mmol/L — ABNORMAL LOW (ref 22–32)
Calcium: 9.2 mg/dL (ref 8.9–10.3)
Chloride: 103 mmol/L (ref 98–111)
Creatinine, Ser: 1.14 mg/dL — ABNORMAL HIGH (ref 0.44–1.00)
GFR, Estimated: >60 mL/min (ref >60)
Glucose, Bld: 286 mg/dL — ABNORMAL HIGH (ref 70–99)
Potassium: 3.9 mmol/L (ref 3.5–5.1)
Sodium: 138 mmol/L (ref 135–145)
Total Bilirubin: 1 mg/dL (ref 0.0–1.2)
Total Protein: 6.5 g/dL (ref 6.5–8.1)

## 2024-05-27 LAB — ETHANOL: Alcohol, Ethyl (B): 186 mg/dL — ABNORMAL HIGH (ref <15)

## 2024-05-27 LAB — I-STAT CHEM 8, ED
BUN: 11 mg/dL (ref 6–20)
Calcium, Ion: 1.07 mmol/L — ABNORMAL LOW (ref 1.15–1.40)
Chloride: 104 mmol/L (ref 98–111)
Creatinine, Ser: 1.4 mg/dL — ABNORMAL HIGH (ref 0.44–1.00)
Glucose, Bld: 285 mg/dL — ABNORMAL HIGH (ref 70–99)
HCT: 35 % — ABNORMAL LOW (ref 36.0–46.0)
Hemoglobin: 11.9 g/dL — ABNORMAL LOW (ref 12.0–15.0)
Potassium: 5.3 mmol/L — ABNORMAL HIGH (ref 3.5–5.1)
Sodium: 139 mmol/L (ref 135–145)
TCO2: 21 mmol/L — ABNORMAL LOW (ref 22–32)

## 2024-05-27 LAB — CBC
HCT: 32.9 % — ABNORMAL LOW (ref 36.0–46.0)
Hemoglobin: 10.3 g/dL — ABNORMAL LOW (ref 12.0–15.0)
MCH: 26.5 pg (ref 26.0–34.0)
MCHC: 31.3 g/dL (ref 30.0–36.0)
MCV: 84.8 fL (ref 80.0–100.0)
Platelets: 339 K/uL (ref 150–400)
RBC: 3.88 MIL/uL (ref 3.87–5.11)
RDW: 13.5 % (ref 11.5–15.5)
WBC: 11.8 K/uL — ABNORMAL HIGH (ref 4.0–10.5)
nRBC: 0 % (ref 0.0–0.2)

## 2024-05-27 LAB — TROPONIN I (HIGH SENSITIVITY): Troponin I (High Sensitivity): 6 ng/L (ref <18)

## 2024-05-27 LAB — HCG, SERUM, QUALITATIVE: Preg, Serum: NEGATIVE

## 2024-05-27 LAB — I-STAT CG4 LACTIC ACID, ED: Lactic Acid, Venous: 6.3 mmol/L (ref 0.5–1.9)

## 2024-05-27 LAB — PROTIME-INR
INR: 0.9 (ref 0.8–1.2)
Prothrombin Time: 13 s (ref 11.4–15.2)

## 2024-05-27 MED ORDER — ROCURONIUM BROMIDE 100 MG/10ML IV SOLN
INTRAVENOUS | Status: DC | PRN
  Administered 2024-05-27: 22:00:00 100 mg via INTRAVENOUS

## 2024-05-27 MED ORDER — HYDRALAZINE HCL 20 MG/ML IJ SOLN
10.0000 mg | INTRAMUSCULAR | Status: DC | PRN
  Administered 2024-06-03: 10 mg via INTRAVENOUS
  Filled 2024-05-27: qty 1

## 2024-05-27 MED ORDER — LACTATED RINGERS IV BOLUS
2000.0000 mL | Freq: Once | INTRAVENOUS | Status: AC
  Administered 2024-05-27: 21:00:00 2000 mL via INTRAVENOUS

## 2024-05-27 MED ORDER — PROPOFOL 10 MG/ML IV BOLUS
INTRAVENOUS | Status: AC
  Filled 2024-05-27: qty 20

## 2024-05-27 MED ORDER — EPHEDRINE SULFATE (PRESSORS) 25 MG/5ML IV SOSY
PREFILLED_SYRINGE | INTRAVENOUS | Status: DC | PRN
  Administered 2024-05-27 (×2): 10 mg via INTRAVENOUS

## 2024-05-27 MED ORDER — FENTANYL CITRATE (PF) 250 MCG/5ML IJ SOLN
INTRAMUSCULAR | Status: DC | PRN
  Administered 2024-05-27: 22:00:00 100 ug via INTRAVENOUS

## 2024-05-27 MED ORDER — TRANEXAMIC ACID-NACL 1000-0.7 MG/100ML-% IV SOLN
1000.0000 mg | Freq: Once | INTRAVENOUS | Status: AC
  Administered 2024-05-27: 22:00:00 1000 mg via INTRAVENOUS

## 2024-05-27 MED ORDER — FENTANYL CITRATE (PF) 50 MCG/ML IJ SOSY
50.0000 ug | PREFILLED_SYRINGE | Freq: Once | INTRAMUSCULAR | Status: AC
  Administered 2024-05-27: 19:00:00 50 ug via INTRAVENOUS
  Filled 2024-05-27: qty 1

## 2024-05-27 MED ORDER — CALCIUM CHLORIDE 10 % IV SOLN
INTRAVENOUS | Status: DC | PRN
  Administered 2024-05-27 (×2): 300 mg via INTRAVENOUS
  Administered 2024-05-27: 23:00:00 400 mg via INTRAVENOUS

## 2024-05-27 MED ORDER — CEFAZOLIN SODIUM-DEXTROSE 2-4 GM/100ML-% IV SOLN
2.0000 g | INTRAVENOUS | Status: AC
  Administered 2024-05-27: 20:00:00 2 g via INTRAVENOUS

## 2024-05-27 MED ORDER — CEFAZOLIN SODIUM-DEXTROSE 2-3 GM-%(50ML) IV SOLR
INTRAVENOUS | Status: DC | PRN
  Administered 2024-05-27: 23:00:00 2 g via INTRAVENOUS

## 2024-05-27 MED ORDER — INSULIN ASPART 100 UNIT/ML IJ SOLN
0.0000 [IU] | Freq: Three times a day (TID) | INTRAMUSCULAR | Status: DC
  Administered 2024-05-28: 13:00:00 5 [IU] via SUBCUTANEOUS
  Administered 2024-05-28: 09:00:00 8 [IU] via SUBCUTANEOUS
  Administered 2024-05-28: 17:00:00 2 [IU] via SUBCUTANEOUS
  Administered 2024-05-29: 18:00:00 5 [IU] via SUBCUTANEOUS
  Administered 2024-05-29 – 2024-05-31 (×5): 3 [IU] via SUBCUTANEOUS
  Administered 2024-05-31: 5 [IU] via SUBCUTANEOUS
  Administered 2024-06-01 (×2): 3 [IU] via SUBCUTANEOUS
  Administered 2024-06-01: 5 [IU] via SUBCUTANEOUS
  Administered 2024-06-02 (×2): 3 [IU] via SUBCUTANEOUS
  Administered 2024-06-02: 2 [IU] via SUBCUTANEOUS
  Administered 2024-06-03 – 2024-06-04 (×4): 3 [IU] via SUBCUTANEOUS
  Administered 2024-06-04: 5 [IU] via SUBCUTANEOUS
  Administered 2024-06-04 – 2024-06-05 (×2): 3 [IU] via SUBCUTANEOUS
  Administered 2024-06-05: 2 [IU] via SUBCUTANEOUS
  Administered 2024-06-05 – 2024-06-06 (×4): 3 [IU] via SUBCUTANEOUS
  Administered 2024-06-07: 5 [IU] via SUBCUTANEOUS
  Administered 2024-06-07 – 2024-06-08 (×3): 3 [IU] via SUBCUTANEOUS
  Administered 2024-06-08 (×2): 5 [IU] via SUBCUTANEOUS
  Filled 2024-05-27: qty 3
  Filled 2024-05-27: qty 5
  Filled 2024-05-27 (×4): qty 3
  Filled 2024-05-27 (×3): qty 5
  Filled 2024-05-27: qty 2
  Filled 2024-05-27: qty 5
  Filled 2024-05-27 (×2): qty 3
  Filled 2024-05-27: qty 2
  Filled 2024-05-27 (×7): qty 3
  Filled 2024-05-27: qty 5
  Filled 2024-05-27 (×3): qty 3
  Filled 2024-05-27: qty 2
  Filled 2024-05-27 (×4): qty 3
  Filled 2024-05-27: qty 5
  Filled 2024-05-27: qty 3
  Filled 2024-05-27: qty 5
  Filled 2024-05-27: qty 3

## 2024-05-27 MED ORDER — PROPOFOL 10 MG/ML IV BOLUS
INTRAVENOUS | Status: DC | PRN
  Administered 2024-05-27: 22:00:00 200 mg via INTRAVENOUS

## 2024-05-27 MED ORDER — CALCIUM GLUCONATE-NACL 2-0.675 GM/100ML-% IV SOLN
2.0000 g | Freq: Once | INTRAVENOUS | Status: DC
  Filled 2024-05-27: qty 100

## 2024-05-27 MED ORDER — DOCUSATE SODIUM 100 MG PO CAPS
100.0000 mg | ORAL_CAPSULE | Freq: Two times a day (BID) | ORAL | Status: DC
  Administered 2024-05-28 – 2024-06-08 (×21): 100 mg via ORAL
  Filled 2024-05-27 (×22): qty 1

## 2024-05-27 MED ORDER — VANCOMYCIN HCL 1000 MG IV SOLR
INTRAVENOUS | Status: DC | PRN
  Administered 2024-05-27: 23:00:00 1000 mg

## 2024-05-27 MED ORDER — EPHEDRINE 5 MG/ML INJ
INTRAVENOUS | Status: AC
  Filled 2024-05-27: qty 10

## 2024-05-27 MED ORDER — OXYCODONE HCL 5 MG PO TABS
5.0000 mg | ORAL_TABLET | ORAL | Status: DC | PRN
  Administered 2024-05-28 (×3): 10 mg via ORAL
  Administered 2024-05-28: 23:00:00 5 mg via ORAL
  Administered 2024-05-28 – 2024-06-08 (×31): 10 mg via ORAL
  Filled 2024-05-27 (×3): qty 2
  Filled 2024-05-27: qty 1
  Filled 2024-05-27 (×33): qty 2

## 2024-05-27 MED ORDER — METHOCARBAMOL 500 MG PO TABS
500.0000 mg | ORAL_TABLET | Freq: Three times a day (TID) | ORAL | Status: DC
  Administered 2024-05-28 (×3): 500 mg via ORAL
  Filled 2024-05-27 (×3): qty 1

## 2024-05-27 MED ORDER — POLYETHYLENE GLYCOL 3350 17 G PO PACK: 17.0000 g | PACK | Freq: Every day | ORAL | Status: DC | PRN

## 2024-05-27 MED ORDER — LACTATED RINGERS IV SOLN: INTRAVENOUS | Status: DC | PRN

## 2024-05-27 MED ORDER — METOPROLOL TARTRATE 5 MG/5ML IV SOLN: 5.0000 mg | Freq: Four times a day (QID) | INTRAVENOUS | Status: DC | PRN

## 2024-05-27 MED ORDER — ACETAMINOPHEN 10 MG/ML IV SOLN
INTRAVENOUS | Status: DC | PRN
  Administered 2024-05-27: 1000 mg via INTRAVENOUS

## 2024-05-27 MED ORDER — ROCURONIUM BROMIDE 10 MG/ML (PF) SYRINGE
PREFILLED_SYRINGE | INTRAVENOUS | Status: AC
  Filled 2024-05-27: qty 20

## 2024-05-27 MED ORDER — FENTANYL CITRATE (PF) 250 MCG/5ML IJ SOLN
INTRAMUSCULAR | Status: AC
  Filled 2024-05-27: qty 5

## 2024-05-27 MED ORDER — LIDOCAINE 2% (20 MG/ML) 5 ML SYRINGE
INTRAMUSCULAR | Status: AC
  Filled 2024-05-27: qty 5

## 2024-05-27 MED ORDER — SODIUM CHLORIDE 0.9 % IR SOLN
Status: DC | PRN
  Administered 2024-05-27: 23:00:00 3000 mL

## 2024-05-27 MED ORDER — PROPOFOL 1000 MG/100ML IV EMUL
INTRAVENOUS | Status: AC
  Filled 2024-05-27: qty 100

## 2024-05-27 MED ORDER — SODIUM CHLORIDE 0.9 % IV SOLN: INTRAVENOUS | Status: DC | PRN

## 2024-05-27 MED ORDER — 0.9 % SODIUM CHLORIDE (POUR BTL) OPTIME
TOPICAL | Status: DC | PRN
  Administered 2024-05-27: 23:00:00 1000 mL

## 2024-05-27 MED ORDER — HYDROMORPHONE HCL 1 MG/ML IJ SOLN
1.0000 mg | Freq: Once | INTRAMUSCULAR | Status: DC
  Filled 2024-05-27: qty 1

## 2024-05-27 MED ORDER — MORPHINE SULFATE (PF) 4 MG/ML IV SOLN: 4.0000 mg | INTRAVENOUS | Status: DC | PRN

## 2024-05-27 MED ORDER — PHENYLEPHRINE 80 MCG/ML (10ML) SYRINGE FOR IV PUSH (FOR BLOOD PRESSURE SUPPORT)
PREFILLED_SYRINGE | INTRAVENOUS | Status: AC
  Filled 2024-05-27: qty 10

## 2024-05-27 MED ORDER — DEXAMETHASONE SOD PHOSPHATE PF 10 MG/ML IJ SOLN
INTRAMUSCULAR | Status: DC | PRN
  Administered 2024-05-27: 22:00:00 4 mg via INTRAVENOUS

## 2024-05-27 MED ORDER — ACETAMINOPHEN 500 MG PO TABS
1000.0000 mg | ORAL_TABLET | Freq: Four times a day (QID) | ORAL | Status: DC
  Administered 2024-05-28 – 2024-06-08 (×38): 1000 mg via ORAL
  Filled 2024-05-27 (×41): qty 2

## 2024-05-27 MED ORDER — DIPHENHYDRAMINE HCL 50 MG/ML IJ SOLN
INTRAMUSCULAR | Status: AC
  Filled 2024-05-27: qty 1

## 2024-05-27 MED ORDER — ALBUMIN HUMAN 5 % IV SOLN: INTRAVENOUS | Status: DC | PRN

## 2024-05-27 MED ORDER — PHENYLEPHRINE HCL (PRESSORS) 10 MG/ML IV SOLN
INTRAVENOUS | Status: DC | PRN
  Administered 2024-05-27 (×2): 160 ug via INTRAVENOUS
  Administered 2024-05-27: 23:00:00 240 ug via INTRAVENOUS
  Administered 2024-05-27 (×2): 160 ug via INTRAVENOUS
  Administered 2024-05-27: 23:00:00 240 ug via INTRAVENOUS
  Administered 2024-05-27 (×4): 160 ug via INTRAVENOUS

## 2024-05-27 MED ORDER — IOHEXOL 350 MG/ML SOLN
75.0000 mL | Freq: Once | INTRAVENOUS | Status: AC | PRN
  Administered 2024-05-27: 20:00:00 75 mL via INTRAVENOUS

## 2024-05-27 MED ORDER — ONDANSETRON 4 MG PO TBDP: 4.0000 mg | ORAL_TABLET | Freq: Four times a day (QID) | ORAL | Status: DC | PRN

## 2024-05-27 MED ORDER — VANCOMYCIN HCL 1000 MG IV SOLR
INTRAVENOUS | Status: AC
  Filled 2024-05-27: qty 20

## 2024-05-27 MED ORDER — ONDANSETRON HCL 4 MG/2ML IJ SOLN: 4.0000 mg | Freq: Four times a day (QID) | INTRAMUSCULAR | Status: DC | PRN

## 2024-05-27 MED ORDER — LIDOCAINE HCL 1 % IJ SOLN
INTRAMUSCULAR | Status: DC | PRN
  Administered 2024-05-27: 22:00:00 100 mg via INTRADERMAL

## 2024-05-27 MED ORDER — PHENYLEPHRINE 80 MCG/ML (10ML) SYRINGE FOR IV PUSH (FOR BLOOD PRESSURE SUPPORT)
PREFILLED_SYRINGE | INTRAVENOUS | Status: AC
  Filled 2024-05-27: qty 30

## 2024-05-27 MED ORDER — SODIUM ZIRCONIUM CYCLOSILICATE 5 G PO PACK
5.0000 g | PACK | Freq: Once | ORAL | Status: DC
  Filled 2024-05-27: qty 1

## 2024-05-27 MED ORDER — METHOCARBAMOL 1000 MG/10ML IJ SOLN
500.0000 mg | Freq: Three times a day (TID) | INTRAMUSCULAR | Status: DC
  Administered 2024-05-29: 06:00:00 500 mg via INTRAVENOUS
  Filled 2024-05-27: qty 10

## 2024-05-27 MED ORDER — CEFAZOLIN SODIUM 1 G IJ SOLR
INTRAMUSCULAR | Status: AC
  Filled 2024-05-27: qty 20

## 2024-05-27 MED ORDER — TETANUS-DIPHTH-ACELL PERTUSSIS 5-2-15.5 LF-MCG/0.5 IM SUSP
0.5000 mL | Freq: Once | INTRAMUSCULAR | Status: AC
  Administered 2024-05-27: 20:00:00 0.5 mL via INTRAMUSCULAR

## 2024-05-27 MED ORDER — FENTANYL CITRATE (PF) 50 MCG/ML IJ SOSY: 50.0000 ug | PREFILLED_SYRINGE | INTRAMUSCULAR | Status: DC | PRN

## 2024-05-27 MED ORDER — ENOXAPARIN SODIUM 30 MG/0.3ML IJ SOSY
30.0000 mg | PREFILLED_SYRINGE | Freq: Two times a day (BID) | INTRAMUSCULAR | Status: DC
  Administered 2024-05-29: 22:00:00 30 mg via SUBCUTANEOUS
  Filled 2024-05-27: qty 0.3
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
## 2024-05-27 NOTE — ED Notes (Signed)
Pts husband updated on pt status

## 2024-05-27 NOTE — ED Notes (Signed)
 Asberry HERO, CNRA given report on the pt
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
## 2024-05-27 NOTE — Anesthesia Procedure Notes (Addendum)
 Procedure Name: Intubation Date/Time: 05/27/2024 9:50 PM  Performed by: Mylo Asberry BIRCH, CRNAPre-anesthesia Checklist: Patient identified, Emergency Drugs available, Suction available and Patient being monitored Patient Re-evaluated:Patient Re-evaluated prior to induction Oxygen Delivery Method: Circle system utilized Preoxygenation: Pre-oxygenation with 100% oxygen Induction Type: IV induction, Rapid sequence and Cricoid Pressure applied Laryngoscope Size: Glidescope and 4 Grade View: Grade I Tube type: Oral Tube size: 7.0 mm Number of attempts: 1 Airway Equipment and Method: Stylet and Video-laryngoscopy (cervical collar inssitu. neutral postion) Placement Confirmation: ETT inserted through vocal cords under direct vision, positive ETCO2 and breath sounds checked- equal and bilateral Secured at: 21 cm Tube secured with: Tape Dental Injury: Teeth and Oropharynx as per pre-operative assessment

## 2024-05-27 NOTE — ED Notes (Signed)
 Pt not answer orientation questions at this time. Pt continues to repeat my pain, my pain.
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
## 2024-05-27 NOTE — ED Notes (Signed)
 When you have time, Paiton Boultinghouse (Husband) [phone] would like update on pt. Thank you

## 2024-05-27 NOTE — ED Provider Notes (Signed)
 [facility] AT [facility] Provider Note   CSN: [id number] Arrival date & time: 05/27/24  1906     Patient presents with: Caitlyn [name] is a 43 y.o. female.   HPI   43 year old female with medical history significant for diabetes mellitus, HTN, OSA, who presents to the emergency department as a level 2 trauma after jumping from a third story window.  EMS thinks that the patient jumped intentionally.  The patient is unwilling to provide a history as to why she jumped out of the window.  She was found to have a likely open ankle fracture.  She is unsure of her last tetanus status.  She arrived with a makeshift splint with EMS in place, intact palpable DP pulse, open fracture likely, noted, mildly oozing blood, subsequently dressed with gauze.  The patient arrives GCS 15, ABC intact.  Prior to Admission medications  Medication Sig Start Date End Date Taking? Authorizing Provider  amoxicillin -clavulanate (AUGMENTIN ) 875-125 MG tablet Take 1 tablet by mouth every 12 (twelve) hours. 12/22/23   Stuart Vernell Norris, PA-C  Blood Glucose Monitoring Suppl DEVI 1 each by Does not apply route in the morning, at noon, and at bedtime. May substitute to any manufacturer covered by patient's insurance. 01/12/23   Oley Bascom RAMAN, NP  chlorhexidine  (PERIDEX ) 0.12 % solution Use as directed 15 mLs in the mouth or throat 2 (two) times daily. 12/22/23   Stuart Vernell Norris, PA-C  diclofenac  (VOLTAREN ) 50 MG EC tablet Take 1 tablet (50 mg total) by mouth 2 (two) times daily. 09/27/22   Beverley Leita LABOR, PA-C  lidocaine  (XYLOCAINE ) 2 % solution Use as directed 10 mLs in the mouth or throat every 3 (three) hours as needed. 12/22/23   Stuart Vernell Norris, PA-C  metFORMIN  (GLUCOPHAGE -XR) 500 MG 24 hr tablet Take 1 tablet (500 mg total) by mouth 2 (two) times daily with a meal. 10/14/22   Oley Bascom RAMAN, NP  methocarbamol  (ROBAXIN ) 500 MG tablet Take 1 tablet (500 mg total)  by mouth 2 (two) times daily. 09/27/22   Beverley Leita LABOR, PA-C  omeprazole  (PRILOSEC) 20 MG capsule Take 1 capsule (20 mg total) by mouth daily. 09/27/22   Beverley Leita LABOR, PA-C  predniSONE  (STERAPRED UNI-PAK 21 TAB) 10 MG (21) TBPK tablet Take by mouth daily. Take 6 tabs by mouth daily  for 2 days, then 5 tabs for 2 days, then 4 tabs for 2 days, then 3 tabs for 2 days, 2 tabs for 2 days, then 1 tab by mouth daily for 2 days 09/27/22   Beverley Leita LABOR, PA-C  Prenatal Vit-Fe Fumarate-FA (PRENATAL MULTIVITAMIN) TABS tablet Take 1 tablet by mouth daily at 12 noon. Patient not taking: Reported on 10/14/2022    [provider]  rosuvastatin  (CRESTOR ) 10 MG tablet Take 1 tablet (10 mg total) by mouth daily. 01/13/23 01/13/24  Oley Bascom RAMAN, NP    Allergies: Codeine    Review of Systems  Cardiovascular:  Positive for chest pain.  All other systems reviewed and are negative.   Updated Vital Signs BP 123/86   Temp 98.1 F (36.7 C) (Oral)   Resp (!) 22   Ht 5' 7 (1.702 m)   Wt 96.2 kg   BMI 33.22 kg/m   Physical Exam Vitals and nursing note reviewed.  Constitutional:      General: She is not in acute distress.    Appearance: She is well-developed.     Comments:  GCS 15, ABC intact  HENT:     Head: Normocephalic and atraumatic.  Eyes:     Extraocular Movements: Extraocular movements intact.     Conjunctiva/sclera: Conjunctivae normal.     Pupils: Pupils are equal, round, and reactive to light.  Neck:     Comments: C-collar in place, no midline TTP Cardiovascular:     Rate and Rhythm: Normal rate and regular rhythm.     Heart sounds: No murmur heard. Pulmonary:     Effort: Pulmonary effort is normal. No respiratory distress.     Breath sounds: Normal breath sounds.  Chest:     Comments: Left clavicle TTP, mid sternal TTP, L chest wall TTP Abdominal:     Palpations: Abdomen is soft.     Tenderness: There is no abdominal tenderness.     Comments: Pelvis stable to lateral  compression  Musculoskeletal:     Cervical back: Neck supple.     Comments: No midline tenderness to palpation of the thoracic or lumbar spine.  Extremities atraumatic with intact range of motion with the exception of an open ankle fracture on the left, intact 2+ DP pulses  Skin:    General: Skin is warm and dry.  Neurological:     Mental Status: She is alert.     Comments: Cranial nerves II through XII grossly intact.  Moving all 4 extremities spontaneously.  Sensation grossly intact all 4 extremities     (all labs ordered are listed, but only abnormal results are displayed) Labs Reviewed  CBC - Abnormal; Notable for the following components:      Result Value   WBC 11.8 (*)    Hemoglobin 10.3 (*)    HCT 32.9 (*)    All other components within normal limits  I-STAT CHEM 8, ED - Abnormal; Notable for the following components:   Potassium 5.3 (*)    Creatinine, Ser 1.40 (*)    Glucose, Bld 285 (*)    Calcium , Ion 1.07 (*)    TCO2 21 (*)    Hemoglobin 11.9 (*)    HCT 35.0 (*)    All other components within normal limits  I-STAT CG4 LACTIC ACID, ED - Abnormal; Notable for the following components:   Lactic Acid, Venous 6.3 (*)    All other components within normal limits  PROTIME-INR  COMPREHENSIVE METABOLIC PANEL WITH GFR  ETHANOL  URINALYSIS, ROUTINE W REFLEX MICROSCOPIC  HCG, SERUM, QUALITATIVE  RAPID URINE DRUG SCREEN, HOSP PERFORMED  SAMPLE TO BLOOD BANK  TROPONIN I (HIGH SENSITIVITY)    EKG: None  Radiology: CT Ankle Left Wo Contrast Result Date: 05/27/2024 EXAM: CT LEFT ANKLE, WITHOUT IV CONTRAST 05/27/2024 08:01:33 PM TECHNIQUE: Axial images were acquired through the left ankle without IV contrast. Reformatted images were reviewed. Automated exposure control, iterative reconstruction, and/or weight based adjustment of the mA/kV was utilized to reduce the radiation dose to as low as reasonably achievable. COMPARISON: None provided. CLINICAL HISTORY: Ankle trauma,  fracture, xray done (Age >= 5y) FINDINGS: BONES: Distal fibular fracture with 1 shaft width lateral displacement and 2.5 cm overriding (coronal image 126). Markedly comminuted distal tibial fracture with approximately 1 shaft width lateral displacement of the ankle relative to the tibial shaft. Associated intraarticular extension with multiple fracture lines extending to the tibial plafond. Additional fracture fragments along the anterolateral talus (coronal image 96), favored to be related to the distal tibia. JOINTS: Displacement of the ankle relative to the tibial shaft (1 shaft width lateral displacement). Intraarticular extension  of tibial fracture lines to the tibial plafond. SOFT TISSUES: Soft tissue gas with subcutaneous radiodensities along the medial aspect of the ankle (axial image 378), suggesting open fracture. IMPRESSION: 1. Markedly comminuted, open distal tibial fracture with displacement and intra-articular extension, as above. 2. Fracture fragments along the anterolateral talus, donor site unclear, favored to be related to the distal tibia. 3. Displaced, overriding distal fibular fracture, as above. Electronically signed by: Pinkie Pebbles MD 05/27/2024 08:24 PM EST RP Workstation: [id number]   CT HEAD WO CONTRAST Result Date: 05/27/2024 EXAM: CT HEAD WITHOUT 05/27/2024 08:01:33 PM TECHNIQUE: CT of the head was performed without the administration of intravenous contrast. Automated exposure control, iterative reconstruction, and/or weight based adjustment of the mA/kV was utilized to reduce the radiation dose to as low as reasonably achievable. COMPARISON: None available. CLINICAL HISTORY: Head trauma, moderate-severe FINDINGS: BRAIN AND VENTRICLES: No acute intracranial hemorrhage. No mass effect or midline shift. No extra-axial fluid collection. No evidence of acute infarct. No hydrocephalus. ORBITS: No acute abnormality. SINUSES AND MASTOIDS: No acute abnormality. SOFT TISSUES AND SKULL:  No acute skull fracture. No acute soft tissue abnormality. IMPRESSION: 1. No acute intracranial abnormality. Electronically signed by: Pinkie Pebbles MD 05/27/2024 08:18 PM EST RP Workstation: [id number]   CT CERVICAL SPINE WO CONTRAST Result Date: 05/27/2024 EXAM: CT CERVICAL SPINE WITHOUT CONTRAST 05/27/2024 08:01:33 PM TECHNIQUE: CT of the cervical spine was performed without the administration of intravenous contrast. Multiplanar reformatted images are provided for review. Automated exposure control, iterative reconstruction, and/or weight based adjustment of the mA/kV was utilized to reduce the radiation dose to as low as reasonably achievable. COMPARISON: None available. CLINICAL HISTORY: Polytrauma, blunt FINDINGS: BONES AND ALIGNMENT: No acute fracture or traumatic malalignment. DEGENERATIVE CHANGES: No significant degenerative changes. SOFT TISSUES: No prevertebral soft tissue swelling. IMPRESSION: 1. No significant abnormality Electronically signed by: Pinkie Pebbles MD 05/27/2024 08:17 PM EST RP Workstation: [id number]   CT CHEST ABDOMEN PELVIS W CONTRAST Result Date: 05/27/2024 EXAM: CT CHEST, ABDOMEN AND PELVIS WITH CONTRAST 05/27/2024 08:01:33 PM TECHNIQUE: CT of the chest, abdomen and pelvis was performed with the administration of 75 mL of intravenous iohexol  (OMNIPAQUE ) 350 MG/ML injection. Multiplanar reformatted images are provided for review. Automated exposure control, iterative reconstruction, and/or weight based adjustment of the mA/kV was utilized to reduce the radiation dose to as low as reasonably achievable. COMPARISON: None available. CLINICAL HISTORY: Polytrauma, blunt. FINDINGS: CHEST: MEDIASTINUM AND LYMPH NODES: Heart and pericardium are unremarkable. The central airways are clear. No mediastinal, hilar or axillary lymphadenopathy. LUNGS AND PLEURA: Mild dependent bibasilar atelectasis. No focal consolidation or pulmonary edema. No pleural effusion or pneumothorax. ABDOMEN  AND PELVIS: LIVER: The liver is unremarkable. GALLBLADDER AND BILE DUCTS: Gallbladder is unremarkable. No biliary ductal dilatation. SPLEEN: No acute abnormality. PANCREAS: No acute abnormality. ADRENAL GLANDS: No acute abnormality. KIDNEYS, URETERS AND BLADDER: Mildly distended bladder. No stones in the kidneys or ureters. No hydronephrosis. No perinephric or periureteral stranding. GI AND BOWEL: Stomach demonstrates no acute abnormality. Normal appendix (image 74). There is no bowel obstruction. REPRODUCTIVE ORGANS: The uterus is within normal limits. PERITONEUM AND RETROPERITONEUM: No ascites. No free air. VASCULATURE: Aorta is normal in caliber. ABDOMINAL AND PELVIS LYMPH NODES: No lymphadenopathy. BONES AND SOFT TISSUES: Mildly displaced left lateral third and fifth rib fractures. Nondisplaced left anterolateral 6th and 7th rib fractures. Posterior left hip dislocation with superiorly displaced fracture fragment (coronal image 105), which is favored to be related to an acetabular fracture. No focal soft tissue abnormality. IMPRESSION: 1.  Left 3rd, and 5th-7th rib fractures. No pneumothorax. 2. Posterior left hip dislocation with associated displaced acetabular fracture. Electronically signed by: Pinkie Pebbles MD 05/27/2024 08:16 PM EST RP Workstation: [id number]   DG Tibia/Fibula Left Port Result Date: 05/27/2024 EXAM:  2 VIEW(S) XRAY OF THE LEFT TIBIA AND FIBULA 05/27/2024 07:18:00 PM COMPARISON: None available. CLINICAL HISTORY: Fall from building. FINDINGS: BONES AND JOINTS: Markedly comminuted, displaced, impacted distal tibia and fibula fractures. Apex medial angulation of the dominant fracture fragments. Apex medial angulation of the distal tibia and fibula with lateral displacement of the distal fracture fragment. Lateral displacement of the talus in relation to the tibia. Lateral rotation of the talus. SOFT TISSUES: Subcutaneous soft tissue edema. IMPRESSION: 1. Markedly comminuted, displaced,  impacted distal tibia and fibula fractures with apex medial angulation and lateral displacement of the distal fracture fragment. Electronically signed by: Oneil Devonshire MD 05/27/2024 07:42 PM EST RP Workstation: [id number]   DG Pelvis Portable Result Date: 05/27/2024 EXAM: 1 or 2 VIEW(S) XRAY OF THE PELVIS 05/27/2024 07:18:00 PM COMPARISON: None available. CLINICAL HISTORY: Clinical history is fall from building FINDINGS: BONES AND JOINTS: Displaced fracture of the superolateral aspect of the left acetabulum is noted. The femoral head is dislocated and superiorly displaced. Overlapping bony structures make evaluation of the femoral head somewhat limited. This will be better evaluated on the upcoming CT examination. No other fracture is noted. SOFT TISSUES: The soft tissues are unremarkable. IMPRESSION: 1. Displaced fracture of the superolateral left acetabulum with associated superiorly displaced femoral head dislocation. 2. Evaluation of the femoral head is limited by overlapping bony structures; this will be better evaluated on the upcoming CT of the chest, abdomen, and pelvis Electronically signed by: Oneil Devonshire MD 05/27/2024 07:40 PM EST RP Workstation: [id number]   DG Chest Port 1 View Result Date: 05/27/2024 EXAM: 1 VIEW(S) XRAY OF THE CHEST 05/27/2024 07:18:00 PM COMPARISON: None available. CLINICAL HISTORY: Trauma FINDINGS: LUNGS AND PLEURA: Low lung volumes with bronchovascular crowding. No focal pulmonary opacity. No pleural effusion. No pneumothorax. HEART AND MEDIASTINUM: Enlarged cardiomediastinal silhouette, likely projectional. BONES AND SOFT TISSUES: No acute osseous abnormality. IMPRESSION: 1. No acute cardiopulmonary process. 2. Low lung volumes with bronchovascular crowding. 3. Enlarged cardiomediastinal silhouette, likely projectional. Electronically signed by: Oneil Devonshire MD 05/27/2024 07:37 PM EST RP Workstation: [id number]     .Critical Care  Performed by: Jerrol Agent,  MD Authorized by: Jerrol Agent, MD   Critical care provider statement:    Critical care time (minutes):  30   Critical care was necessary to treat or prevent imminent or life-threatening deterioration of the following conditions:  Trauma   Critical care was time spent personally by me on the following activities:  Development of treatment plan with patient or surrogate, discussions with consultants, evaluation of patient's response to treatment, examination of patient, ordering and review of laboratory studies, ordering and review of radiographic studies, ordering and performing treatments and interventions, pulse oximetry, re-evaluation of patient's condition and review of old charts    Medications Ordered in the ED  ceFAZolin  (ANCEF ) IVPB 2g/100 mL premix (0 g Intravenous Stopped 05/27/24 2000)  Tdap (ADACEL ) injection 0.5 mL (0.5 mLs Intramuscular Given 05/27/24 2020)  fentaNYL  (SUBLIMAZE ) injection 50 mcg (50 mcg Intravenous Given 05/27/24 1917)  iohexol  (OMNIPAQUE ) 350 MG/ML injection 75 mL (75 mLs Intravenous Contrast Given 05/27/24 2002)    Clinical Course as of 05/27/24 2040  Mon May 27, 2024  2039 Alcohol, Ethyl (B)(!): 186 [JL]    Clinical Course User Index [  JL] Jerrol Agent, MD                                 Medical Decision Making Amount and/or Complexity of Data Reviewed Labs: ordered. Radiology: ordered.  Risk Prescription drug management.     43 year old female with medical history significant for diabetes mellitus, HTN, OSA, who presents to the emergency department as a level 2 trauma after jumping from a third story window.  EMS thinks that the patient jumped intentionally.  The patient is unwilling to provide a history as to why she jumped out of the window.  She was found to have a likely open ankle fracture.  She is unsure of her last tetanus status.  She arrived with a makeshift splint with EMS in place, intact palpable DP pulse, open fracture likely,  noted, mildly oozing blood, subsequently dressed with gauze.  The patient arrives GCS 15, ABC intact.  On arrival, the patient was afebrile, vitally stable.  ABC intact.  IV access was obtained and the patient was administered 50 mcg of fentanyl  on arrival.  Her tetanus was updated.  Concern for open ankle fracture.  The patient did have an intact pulse on the left foot on arrival.  Ancef  administered.  CXR: IMPRESSION:  1. No acute cardiopulmonary process.  2. Low lung volumes with bronchovascular crowding.  3. Enlarged cardiomediastinal silhouette, likely projectional.   Pelvis XR: IMPRESSION:  1. Displaced fracture of the superolateral left acetabulum with associated  superiorly displaced femoral head dislocation.  2. Evaluation of the femoral head is limited by overlapping bony structures;  this will be better evaluated on the upcoming CT of the chest, abdomen, and  pelvis   CT Head: IMPRESSION:  1. No acute intracranial abnormality.   CT Cervical Spine: IMPRESSION:  1. No significant abnormality   CT C/A/P: IMPRESSION:  1. Left 3rd, and 5th-7th rib fractures. No pneumothorax.  2. Posterior left hip dislocation with associated displaced acetabular fracture.   XR L Tib/Fib:  IMPRESSION:  1. Markedly comminuted, displaced, impacted distal tibia and fibula fractures  with apex medial angulation and lateral displacement of the distal fracture  fragment.    Orthopedics Consult: Spoke with Dr. Georgina of on-call Ortho care as the patient had been in Ortho care patient outpatient.  Plan to take the patient to the OR tonight for washout and Ex-Fix of the ankle.  Informed him of the diagnosis of acetabular fracture on the left with posterior hip dislocation as well.  Will plan for operative management.  Given rib fractures, trauma surgery was also consulted, Dr. Paola to admit.  Patient did have a change in mental status, CT head normal, UDS added on due to possible report of  substance abuse in the home.     Final diagnoses:  Fall from window, initial encounter  Type I or II open fracture of left ankle, initial encounter  Closed displaced fracture of left acetabulum, unspecified portion of acetabulum, initial encounter (HCC)  Closed posterior dislocation of left hip, initial encounter [facility])  Closed fracture of multiple ribs of left side, initial encounter    ED Discharge Orders     None          Jerrol Agent, MD 05/27/24 2031

## 2024-05-27 NOTE — H&P (Signed)
 Reason for Consult/Chief Complaint: jump from window Consultant: Jerrol, MD  Caitlyn [name] is an 43 y.o. female.   HPI: 89F s/p jump from third story window, reportedly intentionally. Patient reports being amnestic to the event and needs prompting to remember anything from yesterday. Husband at bedside reports this occurred after they had an argument.   Past Medical History:  Diagnosis Date   Anemia    Diabetes mellitus without complication (HCC)    GM - Diet controlled - no meds   Hypertension    PIH with pregnancy   Sleep apnea    does not use CPAP - lost weight   SVD (spontaneous vaginal delivery)    x 2    Past Surgical History:  Procedure Laterality Date   CESAREAN SECTION  03/13/2010   CESAREAN SECTION WITH BILATERAL TUBAL LIGATION N/A 02/25/2014   Procedure: CESAREAN SECTION WITH BILATERAL TUBAL LIGATION;  Surgeon: Lang JINNY Peel, DO;  Location: [facility];  Service: Obstetrics;  Laterality: N/A;   TOOTH EXTRACTION      History reviewed. No pertinent family history.  Social History:  reports that she quit smoking about 15 years ago. Her smoking use included cigarettes. She started smoking about 25 years ago. She has a 2.5 pack-year smoking history. She has never used smokeless tobacco. She reports current alcohol use. She reports that she does not use drugs.  Allergies: Allergies[1]  Medications: I have reviewed the patient's current medications.  Results for orders placed or performed during the hospital encounter of 05/27/24 (from the past 48 hours)  CBC     Status: Abnormal   Collection Time: 05/27/24  7:10 PM  Result Value Ref Range   WBC 11.8 (H) 4.0 - 10.5 K/uL   RBC 3.88 3.87 - 5.11 MIL/uL   Hemoglobin 10.3 (L) 12.0 - 15.0 g/dL   HCT 67.0 (L) 63.9 - 53.9 %   MCV 84.8 80.0 - 100.0 fL   MCH 26.5 26.0 - 34.0 pg   MCHC 31.3 30.0 - 36.0 g/dL   RDW 86.4 88.4 - 84.4 %   Platelets 339 150 - 400 K/uL   nRBC 0.0 0.0 - 0.2 %    Comment: Performed at [facility], [address], [place], [place] [address]  Protime-INR     Status: None   Collection Time: 05/27/24  7:10 PM  Result Value Ref Range   Prothrombin Time 13.0 11.4 - 15.2 seconds   INR 0.9 0.8 - 1.2    Comment: (NOTE) INR goal varies based on device and disease states. Performed at [facility], [address] [place], [place] [address]   Sample to Blood Bank     Status: None   Collection Time: 05/27/24  7:16 PM  Result Value Ref Range   Blood Bank Specimen SAMPLE AVAILABLE FOR TESTING    Sample Expiration      05/30/2024,2359 Performed at [facility], [address] [place], [place] [address]   I-Stat Chem 8, ED     Status: Abnormal   Collection Time: 05/27/24  7:22 PM  Result Value Ref Range   Sodium 139 135 - 145 mmol/L   Potassium 5.3 (H) 3.5 - 5.1 mmol/L   Chloride 104 98 - 111 mmol/L   BUN 11 6 - 20 mg/dL   Creatinine, Ser 8.59 (H) 0.44 - 1.00 mg/dL   Glucose, Bld 714 (H) 70 - 99 mg/dL    Comment: Glucose reference range applies only  to samples taken after fasting for at least 8 hours.   Calcium , Ion 1.07 (L) 1.15 - 1.40 mmol/L   TCO2 21 (L) 22 - 32 mmol/L   Hemoglobin 11.9 (L) 12.0 - 15.0 g/dL   HCT 64.9 (L) 63.9 - 53.9 %  I-Stat Lactic Acid, ED     Status: Abnormal   Collection Time: 05/27/24  7:23 PM  Result Value Ref Range   Lactic Acid, Venous 6.3 (HH) 0.5 - 1.9 mmol/L   Comment NOTIFIED PHYSICIAN     CT Ankle Left Wo Contrast Result Date: 05/27/2024 EXAM: CT LEFT ANKLE, WITHOUT IV CONTRAST 05/27/2024 08:01:33 PM TECHNIQUE: Axial images were acquired through the left ankle without IV contrast. Reformatted images were reviewed. Automated exposure control, iterative reconstruction, and/or weight based adjustment of the mA/kV was utilized to reduce the radiation dose to as low as reasonably achievable. COMPARISON: None provided. CLINICAL HISTORY: Ankle trauma, fracture, xray done (Age >= 5y) FINDINGS: BONES: Distal fibular fracture with 1  shaft width lateral displacement and 2.5 cm overriding (coronal image 126). Markedly comminuted distal tibial fracture with approximately 1 shaft width lateral displacement of the ankle relative to the tibial shaft. Associated intraarticular extension with multiple fracture lines extending to the tibial plafond. Additional fracture fragments along the anterolateral talus (coronal image 96), favored to be related to the distal tibia. JOINTS: Displacement of the ankle relative to the tibial shaft (1 shaft width lateral displacement). Intraarticular extension of tibial fracture lines to the tibial plafond. SOFT TISSUES: Soft tissue gas with subcutaneous radiodensities along the medial aspect of the ankle (axial image 378), suggesting open fracture. IMPRESSION: 1. Markedly comminuted, open distal tibial fracture with displacement and intra-articular extension, as above. 2. Fracture fragments along the anterolateral talus, donor site unclear, favored to be related to the distal tibia. 3. Displaced, overriding distal fibular fracture, as above. Electronically signed by: Pinkie Pebbles MD 05/27/2024 08:24 PM EST RP Workstation: [id number]   CT HEAD WO CONTRAST Result Date: 05/27/2024 EXAM: CT HEAD WITHOUT 05/27/2024 08:01:33 PM TECHNIQUE: CT of the head was performed without the administration of intravenous contrast. Automated exposure control, iterative reconstruction, and/or weight based adjustment of the mA/kV was utilized to reduce the radiation dose to as low as reasonably achievable. COMPARISON: None available. CLINICAL HISTORY: Head trauma, moderate-severe FINDINGS: BRAIN AND VENTRICLES: No acute intracranial hemorrhage. No mass effect or midline shift. No extra-axial fluid collection. No evidence of acute infarct. No hydrocephalus. ORBITS: No acute abnormality. SINUSES AND MASTOIDS: No acute abnormality. SOFT TISSUES AND SKULL: No acute skull fracture. No acute soft tissue abnormality. IMPRESSION: 1. No  acute intracranial abnormality. Electronically signed by: Pinkie Pebbles MD 05/27/2024 08:18 PM EST RP Workstation: [id number]   CT CERVICAL SPINE WO CONTRAST Result Date: 05/27/2024 EXAM: CT CERVICAL SPINE WITHOUT CONTRAST 05/27/2024 08:01:33 PM TECHNIQUE: CT of the cervical spine was performed without the administration of intravenous contrast. Multiplanar reformatted images are provided for review. Automated exposure control, iterative reconstruction, and/or weight based adjustment of the mA/kV was utilized to reduce the radiation dose to as low as reasonably achievable. COMPARISON: None available. CLINICAL HISTORY: Polytrauma, blunt FINDINGS: BONES AND ALIGNMENT: No acute fracture or traumatic malalignment. DEGENERATIVE CHANGES: No significant degenerative changes. SOFT TISSUES: No prevertebral soft tissue swelling. IMPRESSION: 1. No significant abnormality Electronically signed by: Pinkie Pebbles MD 05/27/2024 08:17 PM EST RP Workstation: [id number]   CT CHEST ABDOMEN PELVIS W CONTRAST Result Date: 05/27/2024 EXAM: CT CHEST, ABDOMEN AND PELVIS WITH CONTRAST 05/27/2024 08:01:33 PM TECHNIQUE: CT of  the chest, abdomen and pelvis was performed with the administration of 75 mL of intravenous iohexol  (OMNIPAQUE ) 350 MG/ML injection. Multiplanar reformatted images are provided for review. Automated exposure control, iterative reconstruction, and/or weight based adjustment of the mA/kV was utilized to reduce the radiation dose to as low as reasonably achievable. COMPARISON: None available. CLINICAL HISTORY: Polytrauma, blunt. FINDINGS: CHEST: MEDIASTINUM AND LYMPH NODES: Heart and pericardium are unremarkable. The central airways are clear. No mediastinal, hilar or axillary lymphadenopathy. LUNGS AND PLEURA: Mild dependent bibasilar atelectasis. No focal consolidation or pulmonary edema. No pleural effusion or pneumothorax. ABDOMEN AND PELVIS: LIVER: The liver is unremarkable. GALLBLADDER AND BILE DUCTS:  Gallbladder is unremarkable. No biliary ductal dilatation. SPLEEN: No acute abnormality. PANCREAS: No acute abnormality. ADRENAL GLANDS: No acute abnormality. KIDNEYS, URETERS AND BLADDER: Mildly distended bladder. No stones in the kidneys or ureters. No hydronephrosis. No perinephric or periureteral stranding. GI AND BOWEL: Stomach demonstrates no acute abnormality. Normal appendix (image 74). There is no bowel obstruction. REPRODUCTIVE ORGANS: The uterus is within normal limits. PERITONEUM AND RETROPERITONEUM: No ascites. No free air. VASCULATURE: Aorta is normal in caliber. ABDOMINAL AND PELVIS LYMPH NODES: No lymphadenopathy. BONES AND SOFT TISSUES: Mildly displaced left lateral third and fifth rib fractures. Nondisplaced left anterolateral 6th and 7th rib fractures. Posterior left hip dislocation with superiorly displaced fracture fragment (coronal image 105), which is favored to be related to an acetabular fracture. No focal soft tissue abnormality. IMPRESSION: 1. Left 3rd, and 5th-7th rib fractures. No pneumothorax. 2. Posterior left hip dislocation with associated displaced acetabular fracture. Electronically signed by: Pinkie Pebbles MD 05/27/2024 08:16 PM EST RP Workstation: [id number]   DG Tibia/Fibula Left Port Result Date: 05/27/2024 EXAM:  2 VIEW(S) XRAY OF THE LEFT TIBIA AND FIBULA 05/27/2024 07:18:00 PM COMPARISON: None available. CLINICAL HISTORY: Fall from building. FINDINGS: BONES AND JOINTS: Markedly comminuted, displaced, impacted distal tibia and fibula fractures. Apex medial angulation of the dominant fracture fragments. Apex medial angulation of the distal tibia and fibula with lateral displacement of the distal fracture fragment. Lateral displacement of the talus in relation to the tibia. Lateral rotation of the talus. SOFT TISSUES: Subcutaneous soft tissue edema. IMPRESSION: 1. Markedly comminuted, displaced, impacted distal tibia and fibula fractures with apex medial angulation and  lateral displacement of the distal fracture fragment. Electronically signed by: Oneil Devonshire MD 05/27/2024 07:42 PM EST RP Workstation: [id number]   DG Pelvis Portable Result Date: 05/27/2024 EXAM: 1 or 2 VIEW(S) XRAY OF THE PELVIS 05/27/2024 07:18:00 PM COMPARISON: None available. CLINICAL HISTORY: Clinical history is fall from building FINDINGS: BONES AND JOINTS: Displaced fracture of the superolateral aspect of the left acetabulum is noted. The femoral head is dislocated and superiorly displaced. Overlapping bony structures make evaluation of the femoral head somewhat limited. This will be better evaluated on the upcoming CT examination. No other fracture is noted. SOFT TISSUES: The soft tissues are unremarkable. IMPRESSION: 1. Displaced fracture of the superolateral left acetabulum with associated superiorly displaced femoral head dislocation. 2. Evaluation of the femoral head is limited by overlapping bony structures; this will be better evaluated on the upcoming CT of the chest, abdomen, and pelvis Electronically signed by: Oneil Devonshire MD 05/27/2024 07:40 PM EST RP Workstation: [id number]   DG Chest Port 1 View Result Date: 05/27/2024 EXAM: 1 VIEW(S) XRAY OF THE CHEST 05/27/2024 07:18:00 PM COMPARISON: None available. CLINICAL HISTORY: Trauma FINDINGS: LUNGS AND PLEURA: Low lung volumes with bronchovascular crowding. No focal pulmonary opacity. No pleural effusion. No pneumothorax. HEART AND MEDIASTINUM: Enlarged  cardiomediastinal silhouette, likely projectional. BONES AND SOFT TISSUES: No acute osseous abnormality. IMPRESSION: 1. No acute cardiopulmonary process. 2. Low lung volumes with bronchovascular crowding. 3. Enlarged cardiomediastinal silhouette, likely projectional. Electronically signed by: Oneil Devonshire MD 05/27/2024 07:37 PM EST RP Workstation: [id number]    ROS 10 point review of systems is negative except as listed above in HPI.   Physical Exam Blood pressure 123/86, temperature 98.1  F (36.7 C), temperature source Oral, resp. rate (!) 22, height 5' 7 (1.702 m), weight 96.2 kg. Constitutional: well-developed, well-nourished HEENT: pupils equal, round, reactive to light, 2mm b/l, moist conjunctiva, external inspection of ears and nose normal, hearing intact Oropharynx: normal oropharyngeal mucosa, normal dentition Neck: no thyromegaly, trachea midline, no midline cervical tenderness to palpation Chest: breath sounds equal bilaterally, normal respiratory effort, no midline or lateral chest wall tenderness to palpation/deformity Abdomen: soft, NT, no bruising, no hepatosplenomegaly GU: normal female genitalia  Extremities: 2+ radial and pedal pulses bilaterally, intact motor and sensation bilateral UE and LE, no peripheral edema MSK: unable to assess gait/station, no clubbing/cyanosis of fingers/toes, normal ROM of all four extremities Skin: warm, dry, no rashes Psych: unable to assess     Assessment/Plan: Jump out of window  Suspected suicide attempt - psych consult L rib fx 3/5-7 - pain control, pulm toilet  L open tib-fib fx - ortho c/s, Dr. Georgina Posterior L hip dislocation with associated acetabulum fx - ortho c/s, Dr. Georgina, to OR last PM for reduction and skeletal traction FEN - regular diet DVT - SCDs, LMWH Dispo - med-surg    Dreama GEANNIE Hanger, MD General and Trauma Surgery [facility]      [1]  Allergies Allergen Reactions   Codeine Anaphylaxis and Swelling    Puffy eyes, pt can take Percocet

## 2024-05-27 NOTE — Progress Notes (Signed)
 Orthopedic Tech Progress Note Patient Details:  Caitlyn [name] 09-13-1980 [id number]  Patient ID: Caitlyn [name], female   DOB: December 20, 1980, 43 y.o.   MRN: [id number] I applied traction to a bed in the OR and left 15 lbs of weight and the string to attach the traction with the bed at dr moore's request.  Caitlyn [name] 05/27/2024, 9:19 PM

## 2024-05-28 ENCOUNTER — Encounter (HOSPITAL_COMMUNITY): Payer: Self-pay | Admitting: Orthopedic Surgery

## 2024-05-28 ENCOUNTER — Inpatient Hospital Stay (HOSPITAL_COMMUNITY)

## 2024-05-28 DIAGNOSIS — S82302A Unspecified fracture of lower end of left tibia, initial encounter for closed fracture: Secondary | ICD-10-CM | POA: Diagnosis not present

## 2024-05-28 DIAGNOSIS — S73015A Posterior dislocation of left hip, initial encounter: Secondary | ICD-10-CM

## 2024-05-28 DIAGNOSIS — S32402A Unspecified fracture of left acetabulum, initial encounter for closed fracture: Secondary | ICD-10-CM | POA: Diagnosis not present

## 2024-05-28 DIAGNOSIS — S82872B Displaced pilon fracture of left tibia, initial encounter for open fracture type I or II: Secondary | ICD-10-CM

## 2024-05-28 DIAGNOSIS — S82832A Other fracture of upper and lower end of left fibula, initial encounter for closed fracture: Secondary | ICD-10-CM | POA: Diagnosis not present

## 2024-05-28 DIAGNOSIS — Z9889 Other specified postprocedural states: Secondary | ICD-10-CM | POA: Diagnosis not present

## 2024-05-28 LAB — CBC
HCT: 28.9 % — ABNORMAL LOW (ref 36.0–46.0)
Hemoglobin: 9.6 g/dL — ABNORMAL LOW (ref 12.0–15.0)
MCH: 27 pg (ref 26.0–34.0)
MCHC: 33.2 g/dL (ref 30.0–36.0)
MCV: 81.4 fL (ref 80.0–100.0)
Platelets: 308 K/uL (ref 150–400)
RBC: 3.55 MIL/uL — ABNORMAL LOW (ref 3.87–5.11)
RDW: 13.3 % (ref 11.5–15.5)
WBC: 15.6 K/uL — ABNORMAL HIGH (ref 4.0–10.5)
nRBC: 0 % (ref 0.0–0.2)

## 2024-05-28 LAB — RAPID URINE DRUG SCREEN, HOSP PERFORMED
Amphetamines: NOT DETECTED — AB
Barbiturates: NOT DETECTED — AB
Benzodiazepines: NOT DETECTED — AB
Cocaine: NOT DETECTED — AB
Opiates: NOT DETECTED — AB
Tetrahydrocannabinol: NOT DETECTED — AB

## 2024-05-28 LAB — URINALYSIS, ROUTINE W REFLEX MICROSCOPIC
Bilirubin Urine: NEGATIVE
Glucose, UA: >=500 mg/dL — AB
Hgb urine dipstick: NEGATIVE
Ketones, ur: 5 mg/dL — AB
Leukocytes,Ua: NEGATIVE
Nitrite: NEGATIVE
Protein, ur: NEGATIVE mg/dL
Specific Gravity, Urine: 1.026 (ref 1.005–1.030)
pH: 5 (ref 5.0–8.0)

## 2024-05-28 LAB — BASIC METABOLIC PANEL WITH GFR
Anion gap: 9 (ref 5–15)
BUN: 11 mg/dL (ref 6–20)
CO2: 21 mmol/L — ABNORMAL LOW (ref 22–32)
Calcium: 9.2 mg/dL (ref 8.9–10.3)
Chloride: 107 mmol/L (ref 98–111)
Creatinine, Ser: 0.87 mg/dL (ref 0.44–1.00)
GFR, Estimated: >60 mL/min (ref >60)
Glucose, Bld: 276 mg/dL — ABNORMAL HIGH (ref 70–99)
Potassium: 4.3 mmol/L (ref 3.5–5.1)
Sodium: 137 mmol/L (ref 135–145)

## 2024-05-28 LAB — GLUCOSE, CAPILLARY
Glucose-Capillary: 148 mg/dL — ABNORMAL HIGH (ref 70–99)
Glucose-Capillary: 162 mg/dL — ABNORMAL HIGH (ref 70–99)
Glucose-Capillary: 205 mg/dL — ABNORMAL HIGH (ref 70–99)
Glucose-Capillary: 213 mg/dL — ABNORMAL HIGH (ref 70–99)
Glucose-Capillary: 233 mg/dL — ABNORMAL HIGH (ref 70–99)
Glucose-Capillary: 256 mg/dL — ABNORMAL HIGH (ref 70–99)

## 2024-05-28 LAB — HEMOGLOBIN A1C
Hgb A1c MFr Bld: 7.7 % — ABNORMAL HIGH (ref 4.8–5.6)
Mean Plasma Glucose: 174.29 mg/dL

## 2024-05-28 LAB — HIV ANTIBODY (ROUTINE TESTING W REFLEX): HIV Screen 4th Generation wRfx: NONREACTIVE

## 2024-05-28 LAB — TROPONIN I (HIGH SENSITIVITY): Troponin I (High Sensitivity): 15 ng/L (ref <18)

## 2024-05-28 MED ORDER — SODIUM CHLORIDE 0.9 % IV SOLN: 12.5000 mg | INTRAVENOUS | Status: DC | PRN

## 2024-05-28 MED ORDER — SUGAMMADEX SODIUM 200 MG/2ML IV SOLN
INTRAVENOUS | Status: DC | PRN
  Administered 2024-05-28: 200 mg via INTRAVENOUS

## 2024-05-28 MED ORDER — OXYCODONE HCL 5 MG/5ML PO SOLN: 5.0000 mg | Freq: Once | ORAL | Status: DC | PRN

## 2024-05-28 MED ORDER — AMISULPRIDE (ANTIEMETIC) 5 MG/2ML IV SOLN: 10.0000 mg | Freq: Once | INTRAVENOUS | Status: DC | PRN

## 2024-05-28 MED ORDER — TRANEXAMIC ACID-NACL 1000-0.7 MG/100ML-% IV SOLN
1000.0000 mg | Freq: Once | INTRAVENOUS | Status: AC
  Administered 2024-05-29: 11:00:00 1000 mg via INTRAVENOUS
  Filled 2024-05-28 (×2): qty 100

## 2024-05-28 MED ORDER — GABAPENTIN 100 MG PO CAPS
100.0000 mg | ORAL_CAPSULE | Freq: Three times a day (TID) | ORAL | Status: DC
  Administered 2024-05-28 – 2024-06-08 (×33): 100 mg via ORAL
  Filled 2024-05-28 (×33): qty 1

## 2024-05-28 MED ORDER — DIAZEPAM 5 MG/ML IJ SOLN
5.0000 mg | Freq: Once | INTRAMUSCULAR | Status: AC
  Administered 2024-05-28: 18:00:00 5 mg via INTRAVENOUS
  Filled 2024-05-28: qty 2

## 2024-05-28 MED ORDER — FENTANYL CITRATE (PF) 100 MCG/2ML IJ SOLN
25.0000 ug | INTRAMUSCULAR | Status: DC | PRN
  Administered 2024-05-28: 01:00:00 50 ug via INTRAVENOUS

## 2024-05-28 MED ORDER — MELATONIN 5 MG PO TABS
5.0000 mg | ORAL_TABLET | Freq: Every day | ORAL | Status: DC
  Administered 2024-05-28 – 2024-06-07 (×11): 5 mg via ORAL
  Filled 2024-05-28 (×11): qty 1

## 2024-05-28 MED ORDER — FENTANYL CITRATE (PF) 100 MCG/2ML IJ SOLN
INTRAMUSCULAR | Status: AC
  Filled 2024-05-28: qty 2

## 2024-05-28 MED ORDER — OXYCODONE HCL 5 MG PO TABS: 5.0000 mg | ORAL_TABLET | Freq: Once | ORAL | Status: DC | PRN

## 2024-05-28 MED ORDER — SODIUM CHLORIDE 0.9 % IV SOLN
1.0000 g | INTRAVENOUS | Status: AC
  Administered 2024-05-28 – 2024-05-29 (×2): 1 g via INTRAVENOUS
  Filled 2024-05-28 (×2): qty 10

## 2024-05-28 MED ORDER — CHLORHEXIDINE GLUCONATE CLOTH 2 % EX PADS
6.0000 | MEDICATED_PAD | Freq: Every day | CUTANEOUS | Status: DC
  Administered 2024-05-28 – 2024-06-06 (×8): 6 via TOPICAL

## 2024-05-28 MED ORDER — DULOXETINE HCL 20 MG PO CPEP
20.0000 mg | ORAL_CAPSULE | Freq: Every day | ORAL | Status: DC
  Administered 2024-05-28 – 2024-06-08 (×11): 20 mg via ORAL
  Filled 2024-05-28 (×12): qty 1

## 2024-05-28 MED ORDER — INSULIN ASPART 100 UNIT/ML IJ SOLN
5.0000 [IU] | Freq: Once | INTRAMUSCULAR | Status: AC
  Administered 2024-05-28: 01:00:00 5 [IU] via SUBCUTANEOUS

## 2024-05-28 SURGICAL SUPPLY — 1 items: NDL 22X1.5 STRL (OR ONLY) (MISCELLANEOUS) IMPLANT
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
## 2024-05-28 NOTE — Progress Notes (Signed)
 Call placed to Dr. Lucious to inform of blood sugar value of 233. See new order/MAR. Second call placed to Dr. Lucious 0040 to inform of  blood pressure 157/101, no new orders given per Dr. Lucious.

## 2024-05-28 NOTE — TOC Initial Note (Addendum)
 Transition of Care [facility]) - Initial/Assessment Note    Patient Details  Name: Caitlyn [name] MRN: [id number] Date of Birth: May 28, 1981  Transition of [facility]) CM/SW Contact:    Josepha Mliss HERO, RN Phone Number: 05/28/2024, 1:48 PM  Clinical Narrative:                 43 yo female admitted on 12/15 after intentionally jumping from a third story window.  Met with patient; she states she lives with her husband and 3 children, ages 61,13, and 41.  Patient sustained Lt rib fx 5-7, Lt open tib fib fx, and posterior Lt hip dislocation with associated acetabulum fx.  She states she plans to return to her home when she is able to be discharged from the hospital.  I asked patient if there were any safety concerns in the home, and she denies any.     Patient in traction s/p I&D of Lt open fracture, external fixation of LE with closed reduction of hip and application of traction with pin placement.  Await psychiatry consult; patient currently with sitter at bedside.   PCP is Bascom Borer.  Expected Discharge Plan: Psychiatric Hospital Barriers to Discharge: Continued Medical Work up              Expected Discharge Plan and Services   Discharge Planning Services: CM Consult   Living arrangements for the past 2 months: Apartment                                      Prior Living Arrangements/Services Living arrangements for the past 2 months: Apartment Lives with:: Spouse, Minor Children Patient language and need for interpreter reviewed:: Yes Do you feel safe going back to the place where you live?: Yes      Need for Family Participation in Patient Care: Yes (Comment) Care giver support system in place?: Yes (comment)   Criminal Activity/Legal Involvement Pertinent to Current Situation/Hospitalization: No - Comment as needed                 Emotional Assessment Appearance:: Appears stated age Attitude/Demeanor/Rapport: Engaged Affect (typically observed):  Accepting Orientation: : Oriented to Self, Oriented to Place, Oriented to  Time, Oriented to Situation      Admission diagnosis:  Pelvic fracture (HCC) [S32.9XXA] Closed posterior dislocation of left hip, initial encounter (HCC) [S73.015A] Fall from window, initial encounter [W13.4XXA] Alcoholic intoxication with complication [F10.929] Type I or II open fracture of left ankle, initial encounter [S82.892B] Closed fracture of multiple ribs of left side, initial encounter [S22.42XA] Closed displaced fracture of left acetabulum, unspecified portion of acetabulum, initial encounter [facility]) [S32.402A] Patient Active Problem List   Diagnosis Date Noted   Dislocation of hip, posterior, left, closed (HCC) 05/28/2024   Open pilon fracture, left, type I or II, initial encounter 05/28/2024   Pelvic fracture (HCC) 05/27/2024   Type I or II open fracture of left ankle 05/27/2024   Type 2 diabetes mellitus without complication, without long-term current use of insulin  (HCC) 01/12/2023   Right hip pain 10/19/2022   Status post cesarean delivery 02/25/2014   Fetal macrosomia in pregnancy in third trimester 02/20/2014   Hypertension in pregnancy, pre-existing, antepartum 10/14/2013   Diabetes mellitus (Class B), antepartum 09/10/2013   Hx of preeclampsia, prior pregnancy, currently pregnant 08/21/2013   Previous cesarean section complicating pregnancy 08/21/2013   Hypertension 08/21/2013   Obesity complicating pregnancy  in first trimester 08/21/2013   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 [address] [place] - [address] [place] [address] [place] [address] [place] [address] Phone: [phone] Fax: [phone]  [facility] [phone] - [place], [place] - [address] AT [facility] [address] [place] [address] Phone: [phone] Fax: [phone]     Social Drivers of Health (SDOH) Social History: SDOH  Screenings   Food Insecurity: No Food Insecurity (05/28/2024)  Housing: Low Risk (05/28/2024)  Transportation Needs: No Transportation Needs (05/28/2024)  Utilities: Not At Risk (05/28/2024)  Alcohol Screen: Low Risk (10/14/2022)  Depression (PHQ2-9): Low Risk (10/14/2022)  Financial Resource Strain: Medium Risk (10/14/2022)  Physical Activity: Unknown (10/14/2022)  Social Connections: Moderately Isolated (10/14/2022)  Stress: No Stress Concern Present (10/14/2022)  Tobacco Use: Medium Risk (05/27/2024)   SDOH Interventions:     Readmission Risk Interventions     No data to display         Mliss MICAEL Fass, RN, BSN  Trauma/Neuro ICU Case Manager [phone]

## 2024-05-28 NOTE — TOC CAGE-AID Note (Signed)
 Transition of Care [facility]) - CAGE-AID Screening   Patient Details  Name: Caitlyn [name] MRN: [id number] Date of Birth: 01-08-1981  Transition [facility]) CM/SW Contact:    Josepha Mliss HERO, RN Phone Number: 05/28/2024, 2:40 PM   Clinical Narrative: 43 yo female admitted on 12/15 after intentionally jumping from a third story window.  Patient admits to drinking on weekends, usually 1-2 drinks.  No drug use, per patient. She declines need for SA resources.    CAGE-AID Screening:    Have You Ever Felt You Ought to Cut Down on Your Drinking or Drug Use?: No Have People Annoyed You By Critizing Your Drinking Or Drug Use?: No Have You Felt Bad Or Guilty About Your Drinking Or Drug Use?: No Have You Ever Had a Drink or Used Drugs First Thing In The Morning to Steady Your Nerves or to Get Rid of a Hangover?: No CAGE-AID Score: 0  Substance Abuse Education Offered: Yes   Mliss MICAEL Josepha, RN, BSN  Trauma/Neuro ICU Case Manager [phone]

## 2024-05-28 NOTE — Op Note (Signed)
 Orthopedic Surgery Operative Report   Procedure: Left hip dislocation closed reduction under anesthesia Left open pilon fracture irrigation and debridement to the level of bone at the site of the open fracture Application of uniplanar external fixator for left pilon fracture Placement of traction pin in the left distal femur   Modifier: none   Date of procedure: 05/28/2024   Patient name: Caitlyn [name]   MRN: [id number]  DOB: 21-Dec-1980   Surgeon: Ozell Ada, MD Assistant: none Pre-operative diagnosis: left open pilon fracture, left hip dislocation with an associated acetabulum fracture Post-operative diagnosis: same as above Findings: left hip dislocation and large posterior acetabulum wall fracture, hip fracture reduced easily, medial wound with no gross contamination at the distal tibia   Specimens: none Anesthesia: general EBL: 30cc Complications: none Pre-incision antibiotic: ancef  TXA was given prior to surgery as well    Implants:  Traction pin in the distal left femur Stryker external fixator device  -2 Schanz pins in the tibia  -1 transcalcaneal pin   -2 bars  -1 pin clamp  -4 bar to bar clamps   Indication for procedure: Patient is a 43 y.o. female who was brought to [facility] ER after a fall from a building. She was brought to the trauma bay and was found to have multiple traumatic injuries. Orthopedics was consulted for a left hip dislocation with an associated acetabulum fracture and a left open pilon fracture. She was admitted to the trauma service.  I met the patient and discussed the fracture. I recommended operative management in the form of closed reduction of the left hip with application of skeletal traction, left open pilon fracture irrigation and debridement, and application of external fixator for left pilon fracture. I covered the risks, benefits, and alternatives of surgery with the patient. All of her questions were answered to her satisfaction.  After our discussion, patient elected to proceed with surgery.    Procedure Description: The patient was met in the pre-operative holding area. The patient's identity and consent were verified. The operative site was marked by myself. The patient's remaining questions about the surgery were answered. The patient was brought back to the operating room. Anesthesia was induced. The patient was transferred to the [facility] table in the supine position. All bony prominences were well padded. The surgical area was cleansed with a CHG scrub  brush and alcohol. had been already administered.TXA was administered by anesthesia. The patient's skin was then prepped and draped in a standard, sterile fashion. A time out was performed that identified the patient, the procedure, and the operative site. All team members agreed with what was stated in the time out.    Attention was first turned to the open wound over the medial distal tibia.  The skin edges were sharply excised with a 15 blade knife.  There was a loose subcutaneous fat which was removed with a rongeur.  There was loose periosteum seen as well.  A pair of pickups and scissors was used to debride this.  At this point, several bony fragments were seen within the wound.  A curette was used to debride the bony edges.  At no point was any gross contamination seen within the wound.  There was no further loose or nonviable appearing tissue within the wound.  The wound was copiously irrigated with 3 L of sterile saline via cystoscopy tubing.  Instruments that have been used were passed off.  Gloves were changed.  Vancomycin  powder was placed in the wound.  The wound was loosely closed with 2-0 PDS and 3-0 nylon for the skin.  Next, the external fixator was applied.  A stab incision was made over the proximal tibia just medial to the tibial crest.  The incision was taken down sharply through skin and dermis.  A tonsil was used to bluntly dissect the level of bone.  A self  drilling self-tapping Schanz pin was then placed along the tibia and put through the tibia bicortically.  A pin clamp was then placed over this Schanz pin to estimate where the next Schanz pin would need to be placed.  A stab incision was made over this estimated point.  The incision was taken sharply down through skin and dermis.  A tonsil was used to bluntly sected to the level of the bone.  A self drilling self-tapping Schanz pin was then placed along the tibia and put through it bicortically.  The pin clamp was then placed over both of the tibial pins.  The clamp was tightened into place.  Next, the trans calcaneal pin was placed.  A small incision was made over the medial aspect of the calcaneus.  The incision was taken sharply down through the skin and dermis.  A tonsil was used to bluntly dissect the level of the bone.  The transcalcaneal pin was then placed through the wound into the calcaneus.  The pin was advanced through the calcaneus bicortically.  It was seen tenting the skin on the other side.  A stab incision was made over the pin and then the pin was advanced across the skin.  Once the amount of exposed pin was brought to the same in both sides, it was not advanced any further.  Bar to bar clamps were then applied.  The medial and a lateral bar were used.  They were connected to the bar to bar clamps.  A reduction maneuver was performed with traction.  The bar to bar clamps were all tightened in place as traction was held.  AP and lateral fluoroscopy confirmed restoration of the length and improved alignment.  AP and lateral fluoroscopic images showed satisfactory position of the trans calcaneal pin in the 2 tibial pins.  The open wound was dressed with Xeroform and gauze.  Xeroform was placed around all the external fixator pins.  Kerlix was used around the external fixator pins to bolster the sites.  An Ace wrap was placed around the leg and external fixator.  The drapes were then taken down at  this point.  A reduction maneuver was performed with the assistant holding countertraction on the pelvis.  A palpable clunk was felt as the hip was reduced.  Fluoroscopic images confirmed reduction of the hip joint.  They also showed a large posterior wall acetabular fracture.  The medial skin and lateral skin at the distal femur were prepped with ChloraPrep.  A traction pin was then placed through the skin and applied along the medial aspect of the distal femur.  The pin was placed across the femur bicortically.  It was then advanced through the lateral distal thigh skin. All counts were correct at the end of the case.  Patient was transferred back to a hospital bed.  A traction bow was applied to the traction pin.  15 pounds were attached to the traction bow via rope.  The patient was awakened from anesthesia and brought back to the post-anesthesia care unit in stable condition.     Post-operative plan: The patient will recover in  the post-anesthesia care unit and then go to the floor on the trauma service. The patient will receive two post-operative doses of ceftriaxone .  The patient will be nonweightbearing on the left lower extremity in the external fixator and in traction.  Patient will need definitive fixation of both her pilon fracture and her acetabular fracture.  I will talk with my traumatology colleagues about management of these fractures.  Anticipate that the acetabulum surgery will happen sometime this week.    Ozell Ada, MD Orthopedic Surgeon

## 2024-05-28 NOTE — Progress Notes (Signed)
 OT Cancellation Note  Patient Details Name: CIELA MAHAJAN MRN: [id number] DOB: Dec 02, 1980   Cancelled Treatment:    Reason Eval/Treat Not Completed: Patient not medically ready Pt in traction; awaiting completion of definitive operative plans. Will continue to follow patient and evaluate when appropriate.    Leita Howell, OTR/L,CBIS  Supplemental OT - [facility] and [facility] Secure Chat Preferred   05/28/2024, 8:49 AM

## 2024-05-28 NOTE — Progress Notes (Signed)
 Orthopedic Surgery Progress Note   Assessment: Patient is a 43 y.o. female with left hip dislocation and associated acetabulum fracture, left open pilon fracture status post closed reduction of the hip and I&D with ex-fix of the left open pilon   Plan: -Planning for operative management tomorrow with Dr. Kendal -Diet: NPO at midnight -DVT ppx: per primary -Antibiotics: ceftriaxone  -Weight bearing status: NWB LLE in traction -PT evaluate and treat post-operatively -Pain control -Dispo: pending completion of operative plans   Hip was noted to be dislocated on this evening's films. I met the patient and discussed this finding. I explained that I think it may have happened when the traction was off due to the weights being on the floor. I ordered some valium  to help with the muscle spasms. I think applied traction and flexion to the hip. I had help with some countertraction. A palpable clunk was felt and the hip was felt to be reduced. A radiograph was obtained which confirmed reduction. This time the pounds of traction was increased to 25lbs. The patient was also moved up in the bed so the traction is in effect.  ___________________________________________________________________________  Subjective: Having a lot of muscle spasm pain. Feels her foot is swollen on the left. Having chest, left thigh/hip, and left ankle pain. She is also reporting back pain. No pain elsewhere.   Of note, weights were on the floor earlier today. Ortho tech was called who readjusted the skeletal traction.    Physical Exam:  General: no acute distress, appears stated age Neurologic: alert, answering questions appropriately, following commands Respiratory: unlabored breathing on room air, symmetric chest rise Psychiatric: appropriate affect, normal cadence to speech  MSK:    -Left lower extremity  External fixator in place, traction pin and bow in the distal femur EHL/TA/GSC intact Plantarflexes and  dorsiflexes toes Sensation intact to light touch in sural, saphenous, tibial, deep peroneal, and superficial peroneal nerve distributions Foot warm and well perfused   Yesterday's total administered Morphine  Milligram Equivalents: 45   Patient name: Caitlyn [name] Patient MRN: [id number] Date: 05/28/2024

## 2024-05-28 NOTE — Progress Notes (Signed)
 PT Cancellation Note  Patient Details Name: Caitlyn [name] MRN: [id number] DOB: 05-29-1981   Cancelled Treatment:    Reason Eval/Treat Not Completed: Patient not medically ready (pt in traction; awaiting completion of definitive operative plans).  Caitlyn [name], PT, DPT Acute Rehabilitation Services Office [phone]    Caitlyn [name] 05/28/2024, 7:49 AM

## 2024-05-28 NOTE — Progress Notes (Signed)
 Orthopedic Post-operative Check  Patient drowsy, in no acute distress.  LLE: EHL/TA/GSC intact, sensation intact to light touch in s/s/dp/sp/t nerve distributions, palpable DP pulse, foot warm and well perfused, external fixator in place - no drainage seen, traction bow with weights off the end of the bed in place

## 2024-05-28 NOTE — Inpatient Diabetes Management (Signed)
 Inpatient Diabetes Program Recommendations  AACE/ADA: New Consensus Statement on Inpatient Glycemic Control (2015)  Target Ranges:  Prepandial:   less than 140 mg/dL      Peak postprandial:   less than 180 mg/dL (1-2 hours)      Critically ill patients:  140 - 180 mg/dL   Lab Results  Component Value Date   GLUCAP 205 (H) 05/28/2024   HGBA1C 7.7 (H) 05/28/2024    Review of Glycemic Control  Latest Reference Range & Units 05/28/24 00:46 05/28/24 07:44 05/28/24 11:50  Glucose-Capillary 70 - 99 mg/dL 786 (H) 743 (H) 794 (H)   Diabetes history: DM 2 (A1C was 14% in the past) Outpatient Diabetes medications:  None listed- diet controlled Current orders for Inpatient glycemic control:  Novolog  0-15 units tid with meals   Inpatient Diabetes Program Recommendations:    Please consider adding basal insulin  while in the hospital for glycemic control post-op.  Consider adding Lantus 15 units daily.   Thanks,  Randall Bullocks, RN, BC-ADM Inpatient Diabetes Coordinator Pager [phone]  (8a-5p)

## 2024-05-28 NOTE — Transfer of Care (Signed)
 Immediate Anesthesia Transfer of Care Note  Patient: SAFINA HUARD  Procedure(s) Performed: IRRIGATION AND DEBRIDEMENT, OPEN FRACTURE (Left: Ankle) EXTERNAL FIXATION, LOWER EXTREMITY (Left: Ankle) CLOSED REDUCTION, HIP AND APPLICATION TRACTION WITH PIN PLACEMENT (Left: Hip)  Patient Location: PACU  Anesthesia Type:General  Level of Consciousness: awake and alert   Airway & Oxygen Therapy: Patient Spontanous Breathing and Patient connected to nasal cannula oxygen  Post-op Assessment: Report given to RN and Post -op Vital signs reviewed and stable  Post vital signs: Reviewed and stable  Last Vitals:  Vitals Value Taken Time  BP 154/97 05/28/24 00:21  Temp    Pulse 101 05/28/24 00:23  Resp 17 05/28/24 00:23  SpO2 100 % 05/28/24 00:23  Vitals shown include unfiled device data.  Last Pain:  Vitals:   05/27/24 2021  TempSrc:   PainSc: 10-Worst pain ever         Complications: No notable events documented.

## 2024-05-28 NOTE — Brief Op Note (Signed)
 05/27/2024 - 05/28/2024  12:20 AM  PATIENT:  Caitlyn [name]  43 y.o. female  PRE-OPERATIVE DIAGNOSIS:  Left open pilon fracture, left hip dislocation with associated acetabulum fracture  POST-OPERATIVE DIAGNOSIS:  Left open pilon fracture, left hip dislocation with associated acetabulum fracture  PROCEDURE:  Procedures: IRRIGATION AND DEBRIDEMENT, OPEN FRACTURE (Left) EXTERNAL FIXATION, LOWER EXTREMITY (Left) CLOSED REDUCTION, HIP AND APPLICATION TRACTION WITH PIN PLACEMENT (Left)  SURGEON:  Surgeons and Role:    DEWAINE Georgina Ozell DELENA, MD - Primary  PHYSICIAN ASSISTANT: none  ASSISTANTS: none   ANESTHESIA:   general  EBL:  30 mL   BLOOD ADMINISTERED:none  DRAINS: none   LOCAL MEDICATIONS USED:  NONE  SPECIMEN:  No Specimen  DISPOSITION OF SPECIMEN:  N/A  COUNTS:  YES  TOURNIQUET:  NONE  DICTATION: .Note written in EPIC  PLAN OF CARE: Admit to inpatient   PATIENT DISPOSITION:  PACU - hemodynamically stable.   Delay start of Pharmacological VTE agent (>24hrs) due to surgical blood loss or risk of bleeding: no

## 2024-05-29 ENCOUNTER — Encounter (HOSPITAL_COMMUNITY): Payer: Self-pay

## 2024-05-29 ENCOUNTER — Inpatient Hospital Stay (HOSPITAL_COMMUNITY)

## 2024-05-29 ENCOUNTER — Encounter (HOSPITAL_COMMUNITY): Admission: EM | Disposition: A | Discharge status: Rehabilitation Facility | Payer: Self-pay | Source: Home / Self Care

## 2024-05-29 DIAGNOSIS — S82202A Unspecified fracture of shaft of left tibia, initial encounter for closed fracture: Secondary | ICD-10-CM | POA: Diagnosis not present

## 2024-05-29 DIAGNOSIS — S82832A Other fracture of upper and lower end of left fibula, initial encounter for closed fracture: Secondary | ICD-10-CM | POA: Diagnosis not present

## 2024-05-29 DIAGNOSIS — Z87891 Personal history of nicotine dependence: Secondary | ICD-10-CM

## 2024-05-29 DIAGNOSIS — S82422A Displaced transverse fracture of shaft of left fibula, initial encounter for closed fracture: Secondary | ICD-10-CM | POA: Diagnosis not present

## 2024-05-29 DIAGNOSIS — S32422A Displaced fracture of posterior wall of left acetabulum, initial encounter for closed fracture: Secondary | ICD-10-CM

## 2024-05-29 DIAGNOSIS — I10 Essential (primary) hypertension: Secondary | ICD-10-CM | POA: Diagnosis not present

## 2024-05-29 DIAGNOSIS — S32409D Unspecified fracture of unspecified acetabulum, subsequent encounter for fracture with routine healing: Secondary | ICD-10-CM | POA: Diagnosis not present

## 2024-05-29 DIAGNOSIS — S82302A Unspecified fracture of lower end of left tibia, initial encounter for closed fracture: Secondary | ICD-10-CM | POA: Diagnosis not present

## 2024-05-29 DIAGNOSIS — Z0189 Encounter for other specified special examinations: Secondary | ICD-10-CM | POA: Diagnosis not present

## 2024-05-29 HISTORY — PX: EXTERNAL FIXATION, ANKLE: SHX7570

## 2024-05-29 HISTORY — PX: OPEN REDUCTION INTERNAL FIXATION ACETABULUM POSTERIOR LATERAL: SHX6834

## 2024-05-29 LAB — BASIC METABOLIC PANEL WITH GFR
Anion gap: 12 (ref 5–15)
BUN: 6 mg/dL (ref 6–20)
CO2: 22 mmol/L (ref 22–32)
Calcium: 9.1 mg/dL (ref 8.9–10.3)
Chloride: 101 mmol/L (ref 98–111)
Creatinine, Ser: 0.57 mg/dL (ref 0.44–1.00)
GFR, Estimated: >60 mL/min (ref >60)
Glucose, Bld: 226 mg/dL — ABNORMAL HIGH (ref 70–99)
Potassium: 3.5 mmol/L (ref 3.5–5.1)
Sodium: 135 mmol/L (ref 135–145)

## 2024-05-29 LAB — CBC
HCT: 25.8 % — ABNORMAL LOW (ref 36.0–46.0)
Hemoglobin: 8.5 g/dL — ABNORMAL LOW (ref 12.0–15.0)
MCH: 26.8 pg (ref 26.0–34.0)
MCHC: 32.9 g/dL (ref 30.0–36.0)
MCV: 81.4 fL (ref 80.0–100.0)
Platelets: 257 K/uL (ref 150–400)
RBC: 3.17 MIL/uL — ABNORMAL LOW (ref 3.87–5.11)
RDW: 13.5 % (ref 11.5–15.5)
WBC: 11.7 K/uL — ABNORMAL HIGH (ref 4.0–10.5)
nRBC: 0 % (ref 0.0–0.2)

## 2024-05-29 LAB — VITAMIN B12: Vitamin B-12: 819 pg/mL (ref 180–914)

## 2024-05-29 LAB — GLUCOSE, CAPILLARY
Glucose-Capillary: 198 mg/dL — ABNORMAL HIGH (ref 70–99)
Glucose-Capillary: 201 mg/dL — ABNORMAL HIGH (ref 70–99)
Glucose-Capillary: 250 mg/dL — ABNORMAL HIGH (ref 70–99)
Glucose-Capillary: 206 mg/dL — ABNORMAL HIGH (ref 70–99)

## 2024-05-29 LAB — FOLATE: Folate: 10.2 ng/mL (ref >5.9)

## 2024-05-29 LAB — VITAMIN D 25 HYDROXY (VIT D DEFICIENCY, FRACTURES): Vit D, 25-Hydroxy: <6 ng/mL — ABNORMAL LOW (ref 30–100)

## 2024-05-29 LAB — TSH: TSH: 1.08 u[IU]/mL (ref 0.350–4.500)

## 2024-05-29 MED ORDER — METHOCARBAMOL 500 MG PO TABS
1000.0000 mg | ORAL_TABLET | Freq: Three times a day (TID) | ORAL | Status: AC
  Administered 2024-05-29 – 2024-05-30 (×3): 1000 mg via ORAL
  Filled 2024-05-29 (×3): qty 2

## 2024-05-29 MED ORDER — ACETAMINOPHEN 10 MG/ML IV SOLN: 1000.0000 mg | Freq: Once | INTRAVENOUS | Status: DC | PRN

## 2024-05-29 MED ORDER — HYDROMORPHONE HCL 1 MG/ML IJ SOLN
0.2500 mg | INTRAMUSCULAR | Status: DC | PRN
  Administered 2024-05-29: 13:00:00 0.5 mg via INTRAVENOUS

## 2024-05-29 MED ORDER — METOCLOPRAMIDE HCL 5 MG/ML IJ SOLN: 5.0000 mg | Freq: Three times a day (TID) | INTRAMUSCULAR | Status: DC | PRN

## 2024-05-29 MED ORDER — OXYCODONE HCL 5 MG/5ML PO SOLN: 5.0000 mg | Freq: Once | ORAL | Status: DC | PRN

## 2024-05-29 MED ORDER — FENTANYL CITRATE (PF) 250 MCG/5ML IJ SOLN
INTRAMUSCULAR | Status: DC | PRN
  Administered 2024-05-29 (×2): 50 ug via INTRAVENOUS

## 2024-05-29 MED ORDER — VANCOMYCIN HCL 1000 MG IV SOLR
INTRAVENOUS | Status: DC | PRN
  Administered 2024-05-29 (×2): 1000 mg

## 2024-05-29 MED ORDER — SODIUM CHLORIDE 0.9 % IV SOLN: INTRAVENOUS | Status: DC

## 2024-05-29 MED ORDER — PHENYLEPHRINE 80 MCG/ML (10ML) SYRINGE FOR IV PUSH (FOR BLOOD PRESSURE SUPPORT)
PREFILLED_SYRINGE | INTRAVENOUS | Status: DC | PRN
  Administered 2024-05-29: 12:00:00 80 ug via INTRAVENOUS
  Administered 2024-05-29: 11:00:00 160 ug via INTRAVENOUS
  Administered 2024-05-29: 11:00:00 240 ug via INTRAVENOUS
  Administered 2024-05-29 (×2): 160 ug via INTRAVENOUS

## 2024-05-29 MED ORDER — 0.9 % SODIUM CHLORIDE (POUR BTL) OPTIME
TOPICAL | Status: DC | PRN
  Administered 2024-05-29: 11:00:00 1000 mL

## 2024-05-29 MED ORDER — METOCLOPRAMIDE HCL 5 MG PO TABS: 5.0000 mg | ORAL_TABLET | Freq: Three times a day (TID) | ORAL | Status: DC | PRN

## 2024-05-29 MED ORDER — POLYETHYLENE GLYCOL 3350 17 G PO PACK
17.0000 g | PACK | Freq: Every day | ORAL | Status: DC
  Administered 2024-05-31 – 2024-06-06 (×6): 17 g via ORAL
  Filled 2024-05-29 (×8): qty 1

## 2024-05-29 MED ORDER — MIDAZOLAM HCL (PF) 2 MG/2ML IJ SOLN
INTRAMUSCULAR | Status: DC | PRN
  Administered 2024-05-29: 10:00:00 2 mg via INTRAVENOUS

## 2024-05-29 MED ORDER — ROCURONIUM BROMIDE 10 MG/ML (PF) SYRINGE
PREFILLED_SYRINGE | INTRAVENOUS | Status: DC | PRN
  Administered 2024-05-29: 10:00:00 80 mg via INTRAVENOUS
  Administered 2024-05-29: 12:00:00 20 mg via INTRAVENOUS

## 2024-05-29 MED ORDER — DEXAMETHASONE SOD PHOSPHATE PF 10 MG/ML IJ SOLN
INTRAMUSCULAR | Status: DC | PRN
  Administered 2024-05-29: 11:00:00 10 mg via INTRAVENOUS

## 2024-05-29 MED ORDER — PHENYLEPHRINE HCL-NACL 20-0.9 MG/250ML-% IV SOLN
INTRAVENOUS | Status: DC | PRN
  Administered 2024-05-29: 11:00:00 20 ug/min via INTRAVENOUS

## 2024-05-29 MED ORDER — CEFAZOLIN SODIUM-DEXTROSE 2-4 GM/100ML-% IV SOLN
2.0000 g | Freq: Three times a day (TID) | INTRAVENOUS | Status: AC
  Administered 2024-05-29 – 2024-05-30 (×3): 2 g via INTRAVENOUS
  Filled 2024-05-29 (×3): qty 100

## 2024-05-29 MED ORDER — CHLORHEXIDINE GLUCONATE 0.12 % MT SOLN
OROMUCOSAL | Status: AC
  Filled 2024-05-29: qty 15

## 2024-05-29 MED ORDER — SUGAMMADEX SODIUM 200 MG/2ML IV SOLN
INTRAVENOUS | Status: DC | PRN
  Administered 2024-05-29: 13:00:00 400 mg via INTRAVENOUS

## 2024-05-29 MED ORDER — LACTATED RINGERS IV SOLN: INTRAVENOUS | Status: DC

## 2024-05-29 MED ORDER — LIDOCAINE 5 % EX PTCH
1.0000 | MEDICATED_PATCH | CUTANEOUS | Status: DC
  Administered 2024-05-30 – 2024-06-08 (×10): 1 via TRANSDERMAL
  Filled 2024-05-29 (×10): qty 1

## 2024-05-29 MED ORDER — ONDANSETRON HCL 4 MG/2ML IJ SOLN
INTRAMUSCULAR | Status: DC | PRN
  Administered 2024-05-29: 11:00:00 4 mg via INTRAVENOUS

## 2024-05-29 MED ORDER — HYDROMORPHONE HCL 1 MG/ML IJ SOLN
0.5000 mg | INTRAMUSCULAR | Status: DC | PRN
  Administered 2024-05-29 – 2024-06-04 (×3): 0.5 mg via INTRAVENOUS
  Filled 2024-05-29 (×3): qty 0.5

## 2024-05-29 MED ORDER — VANCOMYCIN HCL 1000 MG IV SOLR
INTRAVENOUS | Status: AC
  Filled 2024-05-29: qty 20

## 2024-05-29 MED ORDER — HYDROMORPHONE HCL 1 MG/ML IJ SOLN
INTRAMUSCULAR | Status: AC
  Filled 2024-05-29: qty 1

## 2024-05-29 MED ORDER — OXYCODONE HCL 5 MG PO TABS: 5.0000 mg | ORAL_TABLET | Freq: Once | ORAL | Status: DC | PRN

## 2024-05-29 MED ORDER — ORAL CARE MOUTH RINSE
15.0000 mL | Freq: Once | OROMUCOSAL | Status: AC
  Administered 2024-05-29: 09:00:00 15 mL via OROMUCOSAL

## 2024-05-29 MED ORDER — CHLORHEXIDINE GLUCONATE 0.12 % MT SOLN: 15.0000 mL | Freq: Once | OROMUCOSAL | Status: AC

## 2024-05-29 MED ORDER — PROPOFOL 10 MG/ML IV BOLUS
INTRAVENOUS | Status: DC | PRN
  Administered 2024-05-29: 10:00:00 140 mg via INTRAVENOUS

## 2024-05-29 MED ORDER — ALBUMIN HUMAN 5 % IV SOLN: INTRAVENOUS | Status: DC | PRN

## 2024-05-29 MED ORDER — LIDOCAINE 2% (20 MG/ML) 5 ML SYRINGE
INTRAMUSCULAR | Status: DC | PRN
  Administered 2024-05-29: 10:00:00 60 mg via INTRAVENOUS

## 2024-05-29 MED FILL — Propofol IV Emul 200 MG/20ML (10 MG/ML): INTRAVENOUS | Qty: 20 | Status: AC

## 2024-05-29 MED FILL — Fentanyl Citrate Preservative Free (PF) Inj 250 MCG/5ML: INTRAMUSCULAR | Qty: 5 | Status: AC

## 2024-05-29 MED FILL — Lidocaine HCl Local Soln Prefilled Syringe 100 MG/5ML (2%): INTRAMUSCULAR | Qty: 5 | Status: AC

## 2024-05-29 MED FILL — Midazolam HCl Inj 2 MG/2ML (Base Equivalent): INTRAMUSCULAR | Qty: 2 | Status: AC

## 2024-05-29 MED FILL — Ondansetron HCl Inj 4 MG/2ML (2 MG/ML): INTRAMUSCULAR | Qty: 2 | Status: AC

## 2024-05-29 NOTE — H&P (View-Only) (Signed)
 Orthopaedic Trauma Service (OTS) Consult   Patient ID: [name] MRN: [id number] DOB/AGE: 19-Aug-1980 43 y.o.  Reason for Consult:Multiple injuries Referring Physician: Dr. Tharon Ada, MD Maralee  HPI: SHAELY GADBERRY is an 43 y.o. female who is being seen in consultation at the request of Dr. Ada for evaluation of multiple orthopedic injuries.  Patient jumped from a height sustaining multiple injuries including a open left distal tibia with intra-articular extension as well as a left posterior wall acetabular fracture dislocation.  She was taken urgently for I&D and external fixation and close reduction of her hip.  Due to the complexity of her injuries Dr. Ada felt that this was outside scope of practice and required treatment by an orthopedic traumatologist.  Patient was seen and evaluated on 5 N. this morning.  She is currently comfortable does have some spasming pain but overall denies any numbness or tingling.  Denies any injuries to her bilateral upper extremities and has mild hip pain on the right but no other significant complaints.  Past Medical History:  Diagnosis Date   Anemia    Diabetes mellitus without complication (HCC)    GM - Diet controlled - no meds   Hypertension    PIH with pregnancy   Sleep apnea    does not use CPAP - lost weight   SVD (spontaneous vaginal delivery)    x 2    Past Surgical History:  Procedure Laterality Date   CESAREAN SECTION  03/13/2010   CESAREAN SECTION WITH BILATERAL TUBAL LIGATION N/A 02/25/2014   Procedure: CESAREAN SECTION WITH BILATERAL TUBAL LIGATION;  Surgeon: Lang JINNY Peel, DO;  Location: [facility];  Service: Obstetrics;  Laterality: N/A;   EXTERNAL FIXATION LEG Left 05/27/2024   Procedure: EXTERNAL FIXATION, LOWER EXTREMITY;  Surgeon: Ada Ozell LABOR, MD;  Location: [facility] OR;  Service: Orthopedics;  Laterality: Left;   HIP CLOSED REDUCTION Left 05/27/2024   Procedure: CLOSED REDUCTION, HIP AND APPLICATION TRACTION WITH PIN  PLACEMENT;  Surgeon: Ada Ozell LABOR, MD;  Location: [facility] OR;  Service: Orthopedics;  Laterality: Left;   IRRIGATION AND DEBRIDEMENT POSTERIOR HIP Left 05/27/2024   Procedure: IRRIGATION AND DEBRIDEMENT, OPEN FRACTURE;  Surgeon: Ada Ozell LABOR, MD;  Location: [facility] OR;  Service: Orthopedics;  Laterality: Left;   TOOTH EXTRACTION      History reviewed. No pertinent family history.  Social History:  reports that she quit smoking about 15 years ago. Her smoking use included cigarettes. She started smoking about 25 years ago. She has a 2.5 pack-year smoking history. She has never used smokeless tobacco. She reports current alcohol use. She reports that she does not use drugs.  Allergies: Allergies[1]  Medications: Medications Ordered Prior to Encounter[2]   ROS: As noted above   Exam: Blood pressure 133/74, pulse (!) 111, temperature 98.2 F (36.8 C), temperature source Oral, resp. rate 18, height 5' 7 (1.702 m), weight 96.2 kg, SpO2 96%. General: No acute distress Orientation: Awake alert and oriented x 3 Mood and Affect: Cooperative and pleasant Gait: Unable to assess due to her fracture Coordination and balance: Within normal limits  Left lower extremity: External fixator is in place.  The Ace wrap overlying the external fixator is clean and dried no strikethrough.  Patient is able to wiggle her toes and endorses sensation of the dorsum and plantar aspect of her foot.  Compartments are soft and compressible.  Leg lengths are symmetric.  No obvious wounds about the knee or the hip.  Bilateral upper extremities  and right lower extremity: Skin without lesions. No tenderness to palpation. Full painless ROM, full strength in each muscle groups without evidence of instability.   Medical Decision Making: Data: Imaging: X-rays and CT scan of the left ankle are reviewed which shows a comminuted distal tibia/pilon fracture with associated fibular fracture with significant articular extension and  a large posterior malleolus piece that is flipped and rotated.  Alignment post external fixation is reasonable alignment.  X-rays and CT scan of the left hip and pelvis are reviewed which shows a large posterior wall fragment.  Post reduction x-ray showed that the hip was dislocated Dr. Georgina provided a bedside reduction and postreduction of those show that the hip is concentric without any significant abnormalities.  Labs:  Results for orders placed or performed during the hospital encounter of 05/27/24 (from the past 24 hours)  Glucose, capillary     Status: Abnormal   Collection Time: 05/28/24 11:50 AM  Result Value Ref Range   Glucose-Capillary 205 (H) 70 - 99 mg/dL  Glucose, capillary     Status: Abnormal   Collection Time: 05/28/24  4:42 PM  Result Value Ref Range   Glucose-Capillary 148 (H) 70 - 99 mg/dL  Glucose, capillary     Status: Abnormal   Collection Time: 05/28/24  8:17 PM  Result Value Ref Range   Glucose-Capillary 162 (H) 70 - 99 mg/dL  Glucose, capillary     Status: Abnormal   Collection Time: 05/29/24  8:06 AM  Result Value Ref Range   Glucose-Capillary 198 (H) 70 - 99 mg/dL   Comment 1 Notify RN   CBC     Status: Abnormal   Collection Time: 05/29/24  8:33 AM  Result Value Ref Range   WBC 11.7 (H) 4.0 - 10.5 K/uL   RBC 3.17 (L) 3.87 - 5.11 MIL/uL   Hemoglobin 8.5 (L) 12.0 - 15.0 g/dL   HCT 74.1 (L) 63.9 - 53.9 %   MCV 81.4 80.0 - 100.0 fL   MCH 26.8 26.0 - 34.0 pg   MCHC 32.9 30.0 - 36.0 g/dL   RDW 86.4 88.4 - 84.4 %   Platelets 257 150 - 400 K/uL   nRBC 0.0 0.0 - 0.2 %     Imaging or Labs ordered: Pelvis x-rays  Medical history and chart was reviewed and case discussed with medical provider.  Assessment/Plan: 43 year old female with left acetabular fracture dislocation and left open distal tibia/pilon fracture.  Patient has 2 significant injuries.  In regards to the first 1 and the acetabular fracture dislocation we will plan to perform open reduction  internal fixation.  Risks benefits were discussed with the patient.  Risks include but not limited to bleeding, infection, malunion, nonunion, hardware failure, hardware rotation, nerve and blood vessel injury, posttraumatic arthritis, avascular necrosis, heterotopic ossification, given the possibility anesthetic complications.  She agreed to proceed with surgery and consent was obtained.  In regards to the left ankle likely her swelling was too significant to proceed with definitive operative reduction internal fixation.  We will plan adjustment of external fixator with potential placement of metatarsal pins to allow for better control of the talus under the tibia.  There may be even a chance that I would perform percutaneous fixation of the articular surface to align the large posterior fragment better.  Risks and benefits for this procedure were discussed and she agrees to proceed with surgery  Surgery w/ risks or Emergency surgery: High complexity Risk (Level 5)  Franky MYRTIS Light, MD  [facility] [phone] (office) [email or web address]       [1]  Allergies Allergen Reactions   Codeine Anaphylaxis and Swelling    Eye swelling, puffiness  OK to take oxycodone /Percocet with no issues  [2]  No current facility-administered medications on file prior to encounter.   Current Outpatient Medications on File Prior to Encounter  Medication Sig Dispense Refill   acetaminophen  (TYLENOL ) 500 MG tablet Take 500 mg by mouth 2 (two) times daily as needed (back pain).     ibuprofen  (ADVIL ) 200 MG tablet Take 400 mg by mouth 2 (two) times daily as needed (back pain).

## 2024-05-29 NOTE — Progress Notes (Signed)
 OT Cancellation Note  Patient Details Name: Caitlyn [name] MRN: [id number] DOB: Oct 07, 1980   Cancelled Treatment:    Reason Eval/Treat Not Completed: Patient not medically ready. Pt for surgery today.  Donny BECKER OT Acute Rehabilitation Services Office [phone]    Rod[place] Dorothyann Distel 05/29/2024, 8:51 AM

## 2024-05-29 NOTE — Op Note (Signed)
 Orthopaedic Surgery Operative Note (CSN: [id number] ) Date of Surgery: 05/29/2024  Admit Date: 05/27/2024   Diagnoses: Pre-Op Diagnoses: Left transverse/posterior wall acetabular fracture dislocation Left type IIIA open distal tibia/fibula fracture  Post-Op Diagnosis: Same  Procedures: CPT 27228-Open reduction internal fixation of left transverse/posterior wall acetabular fracture CPT 11012-Irrigation and debridement of left open distal tibia fracture CPT 27825-Closed reduction of left distal tibia fracture CPT 15852-Incision wound vac placement to left leg   Surgeons : Primary: Kendal Franky SQUIBB, MD  Assistant: Lauraine Moores, PA-C  Location: OR 3   Anesthesia: General   Antibiotics: Scheduled ceftriaxone  with 1 gm vancomycin  powder placed in acetabular incision and 1 gm vancomycin  powder placed in open fracture wound  Tourniquet time: None    Estimated Blood Loss: 150 mL  Complications:None   Specimens:* No specimens in log *   Implants: Implant Name Type Inv. Item Serial No. Manufacturer Lot No. LRB No. Used Action  SCREW CORTEX 3.5X45MM - [id number] Screw SCREW CORTEX 3.5X45MM  [employer]  Left 3 Implanted  SCREW LOCK CORT ST 3.5X30 - [id number] Screw SCREW LOCK CORT ST 3.5X30  [employer]  Left 1 Implanted  SCREW LOCK CORT ST 3.5X34 - [id number] Screw SCREW LOCK CORT ST 3.5X34  [employer]  Left 1 Implanted  SCREW LOCK CORT ST 3.5X38 - [id number] Screw SCREW LOCK CORT ST 3.5X38  [employer]  Left 2 Implanted  PLATE BONE LOCK 5 HOLE - [id number] Plate PLATE BONE LOCK 5 HOLE  [employer]  Left 1 Implanted  PLATE BONE 91MM 7HOLE PELVIC - [id number] Plate PLATE BONE 91MM 7HOLE PELVIC  [employer]  Left 1 Implanted     Indications for Surgery: 43 year old female who fell from a height sustaining a left transverse/posterior wall acetabular fracture.  She had dislocation and was reduced urgently by Dr. Georgina.  She also had  an open distal tibia and fibula fracture that was treated with I&D and external fixation.  She was indicated for formal open reduction internal fixation of her left acetabulum.  She was also indicated for repeat irrigation debridement with possible adjustment of external fixation of the left lower extremity.  Risks and benefits were discussed with the patient.  Risks include but not limited to bleeding, infection, malunion, nonunion, hardware failure, hardware rotation, nerve and blood vessel injury, DVT, even the possibility anesthetic complications.  She agreed to proceed with surgery and consent was obtained.  Operative Findings: 1.  Open reduction internal fixation of the left nondisplaced transverse acetabular fracture with a large posterior wall fragment treated through posterior approach to the hip using [employer] 3.5 mm pelvic recon plates 2.  Repeat irrigation debridement of left open distal tibia fracture with closed/open reduction of the posterior malleolus fracture and realignment and the external fixation with incisional wound VAC placement over the traumatic wound.  Procedure: The patient was identified in the preoperative holding area. Consent was confirmed with the patient and their family and all questions were answered. The operative extremity was marked after confirmation with the patient. she was then brought back to the operating room by our anesthesia colleagues.  She was placed under general anesthetic and carefully transferred over to radiolucent flattop table.  The patient was then placed in a lateral decubitus position with the left side up.  An axillary roll was placed on her down extremity to keep pressure off the neurovascular structures.  The left lower extremity was then prepped and draped in usual sterile fashion.  Timeout was  performed to verify the patient, the procedure, and the extremity.  Preoperative antibiotics were dosed.  Fluoroscopic imaging showed the unstable nature  of her posterior wall fracture.  Preoperative CT scan showed there is a nondisplaced transverse component.  I made a standard posterior approach to the hip.  I carried it down through skin and subcutaneous tissue.  I incised through the gluteal fascia and through the IT band and split the gluteus maximus in line with my incision directed towards the PSIS.  I then exposed the posterior aspect of the hip identified the piriformis tendon and tagged this with #2 FiberWire and released it off the greater trochanter.  I then tagged the obturator internus released this off the greater trochanter identified the sciatic nerve and protected that throughout the case.  I performed excisional debridement of the gluteus minimus and expose the large posterior wall fragment.  I elevated this back up to visualize the hip but was concentric within the acetabulum without any signs of marginal impaction.  I cleaned out the fracture site and then I reduced it back down to holding it in place with 1.6 mm K wires.  I obtained fluoroscopic imaging to confirm concentric and anatomic reduction of the posterior wall in the femoral head within the acetabulum.  I then contoured a 7 hole pelvic recon plate from [employer] placed this to fix onto the ischium and in situ contoured to buttress the posterior wall.  I placed 2 screws into the intact ilium.  I then placed a 5 hole pelvic ring plate posterior to the first form as the wall was significant in size and extended into the posterior column.  Placed 2 screws above and below the fracture site.  Final fluoroscopic imaging was obtained.  The incisions were copiously irrigated.  I drilled through the greater trochanter and brought the tag sutures for the piriformis and obturator internus tendon through this and tied this down.  I then placed a gram of vancomycin  powder.  I closed the gluteal fascia with #1 Vicryl.  I then closed the Scarpa's fascia with #1 Vicryl.  The skin was closed with 2-0  Monocryl and 3-0 nylon.  Sterile dressing was applied.  The drapes were taken down the deep beanbag was deflated the patient was rolled supine and then refocused on her left lower extremity.  The left ankle and knee were then prepped and draped in usual sterile fashion.  Another timeout was performed to verify the patient, the procedure, and the extremity.  I reopened the traumatic laceration after loosening the external fixator.  I was able to extend it proximally and distally to be able to access the fracture site.  I was able to debride a small cortical fragment that was devoid of all soft tissues.  I then was able to use pulsatile lavage to thoroughly irrigate the wound with 3 L of normal saline.  I then changed gloves and instruments and I turned my attention to the large posterior malleolus fragment of the pilon.  This was flipped and rotated up.  I was able to manipulate the fragment back into reduction to where it was adjacent to the talar dome.  I did not put any hardware into hold it in place.  Her swelling was acceptable and potentially would be able to address her fracture within the next week.  The fracture was then aligned the skin was closed with a gram of vancomycin  powder and 3-0 nylon.  The Ex-Fix was reapplied and traction was  applied and fluoroscopic imaging showed adequate alignment and the Ex-Fix was tightened.  An incisional wound VAC was placed over the traumatic wound.  The leg was then dressed.  Patient was then awoke from anesthesia and taken to the PACU in stable condition.   Debridement type: Excisional Debridement  Side: left  Body Location: Pilon/distal tibia  Tools used for debridement: scalpel, curette, and rongeur  Pre-debridement Wound size (cm):   Length: 3        Width: 1     Depth: 1   Post-debridement Wound size (cm):   Closed  Debridement depth beyond dead/damaged tissue down to healthy viable tissue: yes  Tissue layer involved: skin, subcutaneous tissue,  muscle / fascia, bone  Nature of tissue removed: Devitalized Tissue  Irrigation volume: 3L     Irrigation fluid type: Normal Saline   Post Op Plan/Instructions: The patient be nonweightbearing to the left lower extremity.  She will continue to receive Ancef  for open fracture prophylaxis.  Tentatively plan for surgery either Friday or Monday for definitive fixation of her distal tibia with removal of external fixation.  This will be based on her soft tissue swelling and OR availability.  No hip precautions to the left hip.  She may mobilize with physical therapy in the meantime between now and surgery.  She may be placed on Lovenox  for DVT prophylaxis in the interim.  I was present and performed the entire surgery.  Lauraine Moores, PA-C did assist me throughout the case. An assistant was necessary given the difficulty in approach, maintenance of reduction and ability to instrument the fracture.   Franky Light, MD [facility]

## 2024-05-29 NOTE — Progress Notes (Signed)
 PT Cancellation Note  Patient Details Name: Caitlyn [name] MRN: [id number] DOB: 1981/03/22   Cancelled Treatment:    Reason Eval/Treat Not Completed: Patient at procedure or test/unavailable (PT consult appreciated and chart reviewed. Pt off floor at Diagnostic Radiology for x-ray. Will follow-up for PT evaluation as schedule permits.)  Randall SAUNDERS, PT, DPT Acute Rehabilitation Services Office: [phone] Secure Chat Preferred   Delon CHRISTELLA Callander 05/29/2024, 4:15 PM
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
## 2024-05-29 NOTE — Progress Notes (Signed)
 SLP Cancellation Note  Patient Details Name: Caitlyn [name] MRN: [id number] DOB: May 13, 1981   Cancelled treatment:       Reason Eval/Treat Not Completed: Patient at procedure or test/unavailable   Lamia Mariner, Consuelo Fitch 05/29/2024, 11:32 AM

## 2024-05-29 NOTE — Anesthesia Postprocedure Evaluation (Signed)
 Anesthesia Post Note  Patient: Caitlyn [name]  Procedure(s) Performed: IRRIGATION AND DEBRIDEMENT, OPEN FRACTURE (Left: Ankle) EXTERNAL FIXATION, LOWER EXTREMITY (Left: Ankle) CLOSED REDUCTION, HIP AND APPLICATION TRACTION WITH PIN PLACEMENT (Left: Hip)     Patient location during evaluation: PACU Anesthesia Type: General Level of consciousness: awake and alert Pain management: pain level controlled Vital Signs Assessment: post-procedure vital signs reviewed and stable Respiratory status: spontaneous breathing, nonlabored ventilation, respiratory function stable and patient connected to nasal cannula oxygen Cardiovascular status: blood pressure returned to baseline and stable Postop Assessment: no apparent nausea or vomiting Anesthetic complications: no   No notable events documented.  Last Vitals:  Vitals:   05/28/24 1947 05/29/24 0912  BP: 133/74 130/79  Pulse: (!) 111 (!) 103  Resp: 18 18  Temp: 36.8 C 37.2 C  SpO2: 96% 94%                    Debby FORBES Like

## 2024-05-29 NOTE — Consult Note (Signed)
 Orthopaedic Trauma Service (OTS) Consult   Patient ID: Caitlyn [name] MRN: [id number] DOB/AGE: 19-Aug-1980 43 y.o.  Reason for Consult:Multiple injuries Referring Physician: Dr. Tharon Ada, MD [place]  HPI: Caitlyn [name] is an 43 y.o. female who is being seen in consultation at the request of Dr. Ada for evaluation of multiple orthopedic injuries.  Patient jumped from a height sustaining multiple injuries including a open left distal tibia with intra-articular extension as well as a left posterior wall acetabular fracture dislocation.  She was taken urgently for I&D and external fixation and close reduction of her hip.  Due to the complexity of her injuries Dr. Ada felt that this was outside scope of practice and required treatment by an orthopedic traumatologist.  Patient was seen and evaluated on 5 N. this morning.  She is currently comfortable does have some spasming pain but overall denies any numbness or tingling.  Denies any injuries to her bilateral upper extremities and has mild hip pain on the right but no other significant complaints.  Past Medical History:  Diagnosis Date   Anemia    Diabetes mellitus without complication (HCC)    GM - Diet controlled - no meds   Hypertension    PIH with pregnancy   Sleep apnea    does not use CPAP - lost weight   SVD (spontaneous vaginal delivery)    x 2    Past Surgical History:  Procedure Laterality Date   CESAREAN SECTION  03/13/2010   CESAREAN SECTION WITH BILATERAL TUBAL LIGATION N/A 02/25/2014   Procedure: CESAREAN SECTION WITH BILATERAL TUBAL LIGATION;  Surgeon: Lang JINNY Peel, DO;  Location: [facility];  Service: Obstetrics;  Laterality: N/A;   EXTERNAL FIXATION LEG Left 05/27/2024   Procedure: EXTERNAL FIXATION, LOWER EXTREMITY;  Surgeon: Ada Ozell LABOR, MD;  Location: [facility];  Service: Orthopedics;  Laterality: Left;   HIP CLOSED REDUCTION Left 05/27/2024   Procedure: CLOSED REDUCTION, HIP AND APPLICATION TRACTION WITH PIN  PLACEMENT;  Surgeon: Ada Ozell LABOR, MD;  Location: [facility];  Service: Orthopedics;  Laterality: Left;   IRRIGATION AND DEBRIDEMENT POSTERIOR HIP Left 05/27/2024   Procedure: IRRIGATION AND DEBRIDEMENT, OPEN FRACTURE;  Surgeon: Ada Ozell LABOR, MD;  Location: [facility];  Service: Orthopedics;  Laterality: Left;   TOOTH EXTRACTION      History reviewed. No pertinent family history.  Social History:  reports that she quit smoking about 15 years ago. Her smoking use included cigarettes. She started smoking about 25 years ago. She has a 2.5 pack-year smoking history. She has never used smokeless tobacco. She reports current alcohol use. She reports that she does not use drugs.  Allergies: Allergies[1]  Medications: Medications Ordered Prior to Encounter[2]   ROS: As noted above   Exam: Blood pressure 133/74, pulse (!) 111, temperature 98.2 F (36.8 C), temperature source Oral, resp. rate 18, height 5' 7 (1.702 m), weight 96.2 kg, SpO2 96%. General: No acute distress Orientation: Awake alert and oriented x 3 Mood and Affect: Cooperative and pleasant Gait: Unable to assess due to her fracture Coordination and balance: Within normal limits  Left lower extremity: External fixator is in place.  The Ace wrap overlying the external fixator is clean and dried no strikethrough.  Patient is able to wiggle her toes and endorses sensation of the dorsum and plantar aspect of her foot.  Compartments are soft and compressible.  Leg lengths are symmetric.  No obvious wounds about the knee or the hip.  Bilateral upper extremities  and right lower extremity: Skin without lesions. No tenderness to palpation. Full painless ROM, full strength in each muscle groups without evidence of instability.   Medical Decision Making: Data: Imaging: X-rays and CT scan of the left ankle are reviewed which shows a comminuted distal tibia/pilon fracture with associated fibular fracture with significant articular extension and  a large posterior malleolus piece that is flipped and rotated.  Alignment post external fixation is reasonable alignment.  X-rays and CT scan of the left hip and pelvis are reviewed which shows a large posterior wall fragment.  Post reduction x-ray showed that the hip was dislocated Dr. Georgina provided a bedside reduction and postreduction of those show that the hip is concentric without any significant abnormalities.  Labs:  Results for orders placed or performed during the hospital encounter of 05/27/24 (from the past 24 hours)  Glucose, capillary     Status: Abnormal   Collection Time: 05/28/24 11:50 AM  Result Value Ref Range   Glucose-Capillary 205 (H) 70 - 99 mg/dL  Glucose, capillary     Status: Abnormal   Collection Time: 05/28/24  4:42 PM  Result Value Ref Range   Glucose-Capillary 148 (H) 70 - 99 mg/dL  Glucose, capillary     Status: Abnormal   Collection Time: 05/28/24  8:17 PM  Result Value Ref Range   Glucose-Capillary 162 (H) 70 - 99 mg/dL  Glucose, capillary     Status: Abnormal   Collection Time: 05/29/24  8:06 AM  Result Value Ref Range   Glucose-Capillary 198 (H) 70 - 99 mg/dL   Comment 1 Notify RN   CBC     Status: Abnormal   Collection Time: 05/29/24  8:33 AM  Result Value Ref Range   WBC 11.7 (H) 4.0 - 10.5 K/uL   RBC 3.17 (L) 3.87 - 5.11 MIL/uL   Hemoglobin 8.5 (L) 12.0 - 15.0 g/dL   HCT 74.1 (L) 63.9 - 53.9 %   MCV 81.4 80.0 - 100.0 fL   MCH 26.8 26.0 - 34.0 pg   MCHC 32.9 30.0 - 36.0 g/dL   RDW 86.4 88.4 - 84.4 %   Platelets 257 150 - 400 K/uL   nRBC 0.0 0.0 - 0.2 %     Imaging or Labs ordered: Pelvis x-rays  Medical history and chart was reviewed and case discussed with medical provider.  Assessment/Plan: 43 year old female with left acetabular fracture dislocation and left open distal tibia/pilon fracture.  Patient has 2 significant injuries.  In regards to the first 1 and the acetabular fracture dislocation we will plan to perform open reduction  internal fixation.  Risks benefits were discussed with the patient.  Risks include but not limited to bleeding, infection, malunion, nonunion, hardware failure, hardware rotation, nerve and blood vessel injury, posttraumatic arthritis, avascular necrosis, heterotopic ossification, given the possibility anesthetic complications.  She agreed to proceed with surgery and consent was obtained.  In regards to the left ankle likely her swelling was too significant to proceed with definitive operative reduction internal fixation.  We will plan adjustment of external fixator with potential placement of metatarsal pins to allow for better control of the talus under the tibia.  There may be even a chance that I would perform percutaneous fixation of the articular surface to align the large posterior fragment better.  Risks and benefits for this procedure were discussed and she agrees to proceed with surgery  Surgery w/ risks or Emergency surgery: High complexity Risk (Level 5)  Franky MYRTIS Light, MD  [facility] [phone] (office) [email or web address]       [1]  Allergies Allergen Reactions   Codeine Anaphylaxis and Swelling    Eye swelling, puffiness  OK to take oxycodone /Percocet with no issues  [2]  No current facility-administered medications on file prior to encounter.   Current Outpatient Medications on File Prior to Encounter  Medication Sig Dispense Refill   acetaminophen  (TYLENOL ) 500 MG tablet Take 500 mg by mouth 2 (two) times daily as needed (back pain).     ibuprofen  (ADVIL ) 200 MG tablet Take 400 mg by mouth 2 (two) times daily as needed (back pain).

## 2024-05-29 NOTE — Interval H&P Note (Signed)
 History and Physical Interval Note:  05/29/2024 9:30 AM  Caitlyn [name]  has presented today for surgery, with the diagnosis of Left posterior wall acetabulum,.  The various methods of treatment have been discussed with the patient and family. After consideration of risks, benefits and other options for treatment, the patient has consented to  Procedures: OPEN REDUCTION INTERNAL FIXATION ACETABULUM POSTERIOR LATERAL (Left) ADJUSTMENT EXTERNAL FIXATION, ANKLE (Left) as a surgical intervention.  The patient's history has been reviewed, patient examined, no change in status, stable for surgery.  I have reviewed the patient's chart and labs.  Questions were answered to the patient's satisfaction.     Caitlyn [name] P Cherell Colvin

## 2024-05-29 NOTE — Transfer of Care (Signed)
 Immediate Anesthesia Transfer of Care Note  Patient: Caitlyn [name]  Procedure(s) Performed: OPEN REDUCTION INTERNAL FIXATION ACETABULUM POSTERIOR LATERAL (Left: Hip) ADJUSTMENT EXTERNAL FIXATION, ANKLE (Left)  Patient Location: PACU  Anesthesia Type:General  Level of Consciousness: awake, alert , and oriented  Airway & Oxygen Therapy: Patient Spontanous Breathing  Post-op Assessment: Report given to RN and Post -op Vital signs reviewed and stable  Post vital signs: Reviewed and stable  Last Vitals:  Vitals Value Taken Time  BP VSS   Temp    Pulse    Resp 19 05/29/24 13:19  SpO2    Vitals shown include unfiled device data.  Last Pain:  Vitals:   05/29/24 0929  TempSrc:   PainSc: 10-Worst pain ever      Patients Stated Pain Goal: 0 (05/28/24 0049)  Complications: No notable events documented.

## 2024-05-29 NOTE — Progress Notes (Signed)
 Orthopedic Surgery Progress Note   Assessment: Patient is a 43 y.o. female with left hip dislocation and associated acetabulum fracture, left open pilon fracture status post closed reduction of the hip and I&D with ex-fix of the left open pilon   Plan: - Patient is on the schedule for operative management with Dr. Kendal today -Diet: NPO for procedure -DVT ppx: per primary -Antibiotics: ceftriaxone  -Weight bearing status: NWB LLE in traction -PT evaluate and treat post-operatively -Pain control -Patient initially had weakness and was not able to demonstrate motor function in the right upper extremity.  She now has intact neurovascular function distally in the right upper extremity -Dispo: pending completion of operative plans ___________________________________________________________________________  Subjective: Patient feeling better this morning.  She noticed improvement in her spasms since hip has been used.  She has remained in traction overnight.  She has been higher off in the bed and has not moved towards the foot of the bed.  The weights have remained off the floor.   Physical Exam:  General: no acute distress, appears stated age Neurologic: alert, answering questions appropriately, following commands Respiratory: unlabored breathing on room air, symmetric chest rise Psychiatric: appropriate affect, normal cadence to speech  MSK:    -Left lower extremity  External fixator in place, traction pin and bow in the distal femur, 25 pounds of traction off the end of the bed EHL/TA/GSC intact Plantarflexes and dorsiflexes toes Sensation intact to light touch in sural, saphenous, tibial, deep peroneal, and superficial peroneal nerve distributions Foot warm and well perfused  -RUE: AIN/PIN/IO intact, sensation intact to light touch in median/radial/ulnar nerve distributions, hand warm well-perfused, palpable radial pulse   Yesterday's total administered Morphine  Milligram  Equivalents: 82.5   Patient name: Caitlyn [name] Patient MRN: [id number] Date: 05/29/2024

## 2024-05-29 NOTE — Anesthesia Procedure Notes (Signed)
 Procedure Name: Intubation Date/Time: 05/29/2024 10:32 AM  Performed by: Elby Raelene SAUNDERS, CRNAPre-anesthesia Checklist: Patient identified, Emergency Drugs available, Suction available and Patient being monitored Patient Re-evaluated:Patient Re-evaluated prior to induction Oxygen Delivery Method: Circle System Utilized Preoxygenation: Pre-oxygenation with 100% oxygen Induction Type: IV induction and Cricoid Pressure applied Ventilation: Mask ventilation without difficulty Laryngoscope Size: Miller and 2 Grade View: Grade II Tube type: Oral Tube size: 7.0 mm Number of attempts: 1 Airway Equipment and Method: Stylet and Bite block Placement Confirmation: ETT inserted through vocal cords under direct vision, positive ETCO2 and breath sounds checked- equal and bilateral Secured at: 22 cm Tube secured with: Tape Dental Injury: Teeth and Oropharynx as per pre-operative assessment

## 2024-05-29 NOTE — Progress Notes (Signed)
 Progress Note  2 Days Post-Op  Subjective: Pt calm and alert. Reports pain in left chest with movement and in LLE. She is also having some low back spasms. Passing flatus but no BM. Denies abdominal pain. Some nausea after IV pain meds.   Sitter at bedside.   Objective: Vital signs in last 24 hours: Temp:  [98.2 F (36.8 C)-98.4 F (36.9 C)] 98.2 F (36.8 C) (12/16 1947) Pulse Rate:  [106-111] 111 (12/16 1947) Resp:  [18-22] 18 (12/16 1947) BP: (122-133)/(74-80) 133/74 (12/16 1947) SpO2:  [96 %-99 %] 96 % (12/16 1947) Last BM Date : 05/27/24  Intake/Output from previous day: 12/16 0701 - 12/17 0700 In: 1420 [P.O.:1320; IV Piggyback:100] Out: 1650 [Urine:1650] Intake/Output this shift: No intake/output data recorded.  PE: General: pleasant, WD, obese female who is laying in bed in NAD HEENT: head is normocephalic, atraumatic.  Sclera are noninjected.  Pupils equal and round.   Heart: regular, rate, and rhythm. Palpable radial and pedal pulses bilaterally Lungs: No wheezes, rhonchi, or rales noted.  Respiratory effort nonlabored Abd: soft, NT, ND MS: LLE in traction, ex-fix present to LLE as well without significant drainage from pin sites; L toes WWP and NVI Psych: A&Ox3 with an appropriate affect.    Lab Results:  Recent Labs    05/27/24 1910 05/27/24 1922 05/28/24 0553  WBC 11.8*  --  15.6*  HGB 10.3* 11.9* 9.6*  HCT 32.9* 35.0* 28.9*  PLT 339  --  308   BMET Recent Labs    05/27/24 1910 05/27/24 1922 05/28/24 0553  NA 138 139 137  K 3.9 5.3* 4.3  CL 103 104 107  CO2 21*  --  21*  GLUCOSE 286* 285* 276*  BUN 9 11 11   CREATININE 1.14* 1.40* 0.87  CALCIUM  9.2  --  9.2   PT/INR Recent Labs    05/27/24 1910  LABPROT 13.0  INR 0.9   CMP     Component Value Date/Time   NA 137 05/28/2024 0553   NA 135 01/12/2023 1038   K 4.3 05/28/2024 0553   CL 107 05/28/2024 0553   CO2 21 (L) 05/28/2024 0553   GLUCOSE 276 (H) 05/28/2024 0553   GLUCOSE 107  (H) 09/09/2013 1237   BUN 11 05/28/2024 0553   BUN 9 01/12/2023 1038   CREATININE 0.87 05/28/2024 0553   CREATININE 0.62 02/03/2014 0931   CALCIUM  9.2 05/28/2024 0553   PROT 6.5 05/27/2024 1910   PROT 7.1 01/12/2023 1038   ALBUMIN  3.7 05/27/2024 1910   ALBUMIN  4.4 01/12/2023 1038   AST 116 (H) 05/27/2024 1910   ALT 49 (H) 05/27/2024 1910   ALKPHOS 42 05/27/2024 1910   BILITOT 1.0 05/27/2024 1910   BILITOT 0.3 01/12/2023 1038   GFRNONAA >60 05/28/2024 0553   GFRAA >60 07/02/2019 1311   Lipase     Component Value Date/Time   LIPASE 15 10/17/2008 0950       Studies/Results: DG HIP UNILAT WITH PELVIS 1V LEFT Result Date: 05/28/2024 EXAM: 1 VIEW(S) XRAY OF THE LEFT HIP 05/28/2024 06:34:00 PM COMPARISON: None available. CLINICAL HISTORY: Pain FINDINGS: BONES AND JOINTS: Superolateral acetabular fracture. The femoral head is now well seated within the acetabulum. SOFT TISSUES: The soft tissues are unremarkable. IMPRESSION: 1. Superolateral left acetabular fracture. 2. No dislocation. . Electronically signed by: Greig Pique MD 05/28/2024 06:43 PM EST RP Workstation: [id number]   DG Pelvis Comp Min 3V Result Date: 05/28/2024 CLINICAL DATA:  Follow-up acetabular fracture. The patient is currently  in traction, making positioning difficult. EXAM: JUDET PELVIS - 3+ VIEW COMPARISON:  05/27/2024 radiographs and CT. FINDINGS: The bone detail is not as well visualized on the current images due to overlying bowel-gas. The previously demonstrated displaced acetabular fracture and posterior/superior dislocation of the femoral head have not changed significantly. No additional fractures are visualized. IMPRESSION: Grossly unchanged displaced acetabular fracture and posterior/superior dislocation of the femoral head. Electronically Signed   By: Elspeth Bathe M.D.   On: 05/28/2024 16:46   DG HIP UNILAT WITH PELVIS 2-3 VIEWS LEFT Result Date: 05/28/2024 EXAM: SINGLE VIEW XRAY OF THE LEFT HIP 05/27/2024  11:55:00 PM Obtained portably via CR radiography. COMPARISON: 05/27/2024. CLINICAL HISTORY: Surgery, elective [id number]. FINDINGS: BONES AND JOINTS: Interval reduction of the left hip dislocation. Persistent displacement fracture fragment of the superolateral aspect of the left acetabulum is again noted. SOFT TISSUES: The soft tissues are unremarkable. IMPRESSION: 1. Interval reduction of the left hip dislocation. 2. Persistent displaced fracture fragment of the superolateral left acetabulum. Electronically signed by: Waddell Calk MD 05/28/2024 06:40 AM EST RP Workstation: [id number]   DG Tibia/Fibula Left Result Date: 05/28/2024 EXAM: MULTIPLE SEQUENTIAL IMAGES OBTAINED PORTABLE INTRAOPERATIVE C-ARM RADIOGRAPHS OF THE LEFT TIBIA AND FIBULA 05/27/2024 11:55:00 PM COMPARISON: None available. CLINICAL HISTORY: [id number] Surgery, elective [id number] Surgery, elective [phone] FINDINGS: BONES AND JOINTS: Comminuted distal tibial and fibular fractures are present. An external fixation device is in place across these fractures. SOFT TISSUES: The soft tissues are unremarkable. IMPRESSION: 1. Comminuted distal tibial and fibular fractures with external fixator placement. Electronically signed by: Waddell Calk MD 05/28/2024 06:36 AM EST RP Workstation: [id number]   DG Fluoro Rm 1-60 Min - No Report Result Date: 05/28/2024 Fluoroscopy was utilized by the requesting physician.  No radiographic interpretation.   DG C-Arm 1-60 Min-No Report Result Date: 05/28/2024 Fluoroscopy was utilized by the requesting physician.  No radiographic interpretation.   DG Fluoro Rm 1-60 Min - No Report Result Date: 05/28/2024 Fluoroscopy was utilized by the requesting physician.  No radiographic interpretation.   DG Ankle 2 Views Left Result Date: 05/28/2024 EXAM: 2 VIEW(S) XRAY OF THE LEFT ANKLE 05/28/2024 12:47:00 AM CLINICAL HISTORY: Post-operative state COMPARISON: CT left ankle dated 05/27/2024. FINDINGS: BONES AND JOINTS: External  fixation device in place. Markedly comminuted fractures of distal tibia and fibula, mildly displaced, with improved alignment. Widening of tibiotalar joint space, likely postprocedural. SOFT TISSUES: Soft tissue swelling. IMPRESSION: 1. Markedly comminuted distal tibial and fibular fractures, mildly displaced, with improved alignment. 2. External fixation device in place. Widened tibiotalar joint space, likely postprocedural. Electronically signed by: Pinkie Pebbles MD 05/28/2024 12:51 AM EST RP Workstation: [id number]   CT Ankle Left Wo Contrast Result Date: 05/27/2024 EXAM: CT LEFT ANKLE, WITHOUT IV CONTRAST 05/27/2024 08:01:33 PM TECHNIQUE: Axial images were acquired through the left ankle without IV contrast. Reformatted images were reviewed. Automated exposure control, iterative reconstruction, and/or weight based adjustment of the mA/kV was utilized to reduce the radiation dose to as low as reasonably achievable. COMPARISON: None provided. CLINICAL HISTORY: Ankle trauma, fracture, xray done (Age >= 5y) FINDINGS: BONES: Distal fibular fracture with 1 shaft width lateral displacement and 2.5 cm overriding (coronal image 126). Markedly comminuted distal tibial fracture with approximately 1 shaft width lateral displacement of the ankle relative to the tibial shaft. Associated intraarticular extension with multiple fracture lines extending to the tibial plafond. Additional fracture fragments along the anterolateral talus (coronal image 96), favored to be related to the distal tibia. JOINTS: Displacement of the ankle relative to  the tibial shaft (1 shaft width lateral displacement). Intraarticular extension of tibial fracture lines to the tibial plafond. SOFT TISSUES: Soft tissue gas with subcutaneous radiodensities along the medial aspect of the ankle (axial image 378), suggesting open fracture. IMPRESSION: 1. Markedly comminuted, open distal tibial fracture with displacement and intra-articular extension, as  above. 2. Fracture fragments along the anterolateral talus, donor site unclear, favored to be related to the distal tibia. 3. Displaced, overriding distal fibular fracture, as above. Electronically signed by: Pinkie Pebbles MD 05/27/2024 08:24 PM EST RP Workstation: [id number]   CT HEAD WO CONTRAST Result Date: 05/27/2024 EXAM: CT HEAD WITHOUT 05/27/2024 08:01:33 PM TECHNIQUE: CT of the head was performed without the administration of intravenous contrast. Automated exposure control, iterative reconstruction, and/or weight based adjustment of the mA/kV was utilized to reduce the radiation dose to as low as reasonably achievable. COMPARISON: None available. CLINICAL HISTORY: Head trauma, moderate-severe FINDINGS: BRAIN AND VENTRICLES: No acute intracranial hemorrhage. No mass effect or midline shift. No extra-axial fluid collection. No evidence of acute infarct. No hydrocephalus. ORBITS: No acute abnormality. SINUSES AND MASTOIDS: No acute abnormality. SOFT TISSUES AND SKULL: No acute skull fracture. No acute soft tissue abnormality. IMPRESSION: 1. No acute intracranial abnormality. Electronically signed by: Pinkie Pebbles MD 05/27/2024 08:18 PM EST RP Workstation: [id number]   CT CERVICAL SPINE WO CONTRAST Result Date: 05/27/2024 EXAM: CT CERVICAL SPINE WITHOUT CONTRAST 05/27/2024 08:01:33 PM TECHNIQUE: CT of the cervical spine was performed without the administration of intravenous contrast. Multiplanar reformatted images are provided for review. Automated exposure control, iterative reconstruction, and/or weight based adjustment of the mA/kV was utilized to reduce the radiation dose to as low as reasonably achievable. COMPARISON: None available. CLINICAL HISTORY: Polytrauma, blunt FINDINGS: BONES AND ALIGNMENT: No acute fracture or traumatic malalignment. DEGENERATIVE CHANGES: No significant degenerative changes. SOFT TISSUES: No prevertebral soft tissue swelling. IMPRESSION: 1. No significant  abnormality Electronically signed by: Pinkie Pebbles MD 05/27/2024 08:17 PM EST RP Workstation: [id number]   CT CHEST ABDOMEN PELVIS W CONTRAST Result Date: 05/27/2024 EXAM: CT CHEST, ABDOMEN AND PELVIS WITH CONTRAST 05/27/2024 08:01:33 PM TECHNIQUE: CT of the chest, abdomen and pelvis was performed with the administration of 75 mL of intravenous iohexol  (OMNIPAQUE ) 350 MG/ML injection. Multiplanar reformatted images are provided for review. Automated exposure control, iterative reconstruction, and/or weight based adjustment of the mA/kV was utilized to reduce the radiation dose to as low as reasonably achievable. COMPARISON: None available. CLINICAL HISTORY: Polytrauma, blunt. FINDINGS: CHEST: MEDIASTINUM AND LYMPH NODES: Heart and pericardium are unremarkable. The central airways are clear. No mediastinal, hilar or axillary lymphadenopathy. LUNGS AND PLEURA: Mild dependent bibasilar atelectasis. No focal consolidation or pulmonary edema. No pleural effusion or pneumothorax. ABDOMEN AND PELVIS: LIVER: The liver is unremarkable. GALLBLADDER AND BILE DUCTS: Gallbladder is unremarkable. No biliary ductal dilatation. SPLEEN: No acute abnormality. PANCREAS: No acute abnormality. ADRENAL GLANDS: No acute abnormality. KIDNEYS, URETERS AND BLADDER: Mildly distended bladder. No stones in the kidneys or ureters. No hydronephrosis. No perinephric or periureteral stranding. GI AND BOWEL: Stomach demonstrates no acute abnormality. Normal appendix (image 74). There is no bowel obstruction. REPRODUCTIVE ORGANS: The uterus is within normal limits. PERITONEUM AND RETROPERITONEUM: No ascites. No free air. VASCULATURE: Aorta is normal in caliber. ABDOMINAL AND PELVIS LYMPH NODES: No lymphadenopathy. BONES AND SOFT TISSUES: Mildly displaced left lateral third and fifth rib fractures. Nondisplaced left anterolateral 6th and 7th rib fractures. Posterior left hip dislocation with superiorly displaced fracture fragment (coronal  image 105), which is favored to be related  to an acetabular fracture. No focal soft tissue abnormality. IMPRESSION: 1. Left 3rd, and 5th-7th rib fractures. No pneumothorax. 2. Posterior left hip dislocation with associated displaced acetabular fracture. Electronically signed by: Pinkie Pebbles MD 05/27/2024 08:16 PM EST RP Workstation: [id number]   DG Tibia/Fibula Left Port Result Date: 05/27/2024 EXAM:  2 VIEW(S) XRAY OF THE LEFT TIBIA AND FIBULA 05/27/2024 07:18:00 PM COMPARISON: None available. CLINICAL HISTORY: Fall from building. FINDINGS: BONES AND JOINTS: Markedly comminuted, displaced, impacted distal tibia and fibula fractures. Apex medial angulation of the dominant fracture fragments. Apex medial angulation of the distal tibia and fibula with lateral displacement of the distal fracture fragment. Lateral displacement of the talus in relation to the tibia. Lateral rotation of the talus. SOFT TISSUES: Subcutaneous soft tissue edema. IMPRESSION: 1. Markedly comminuted, displaced, impacted distal tibia and fibula fractures with apex medial angulation and lateral displacement of the distal fracture fragment. Electronically signed by: Oneil Devonshire MD 05/27/2024 07:42 PM EST RP Workstation: [id number]   DG Pelvis Portable Result Date: 05/27/2024 EXAM: 1 or 2 VIEW(S) XRAY OF THE PELVIS 05/27/2024 07:18:00 PM COMPARISON: None available. CLINICAL HISTORY: Clinical history is fall from building FINDINGS: BONES AND JOINTS: Displaced fracture of the superolateral aspect of the left acetabulum is noted. The femoral head is dislocated and superiorly displaced. Overlapping bony structures make evaluation of the femoral head somewhat limited. This will be better evaluated on the upcoming CT examination. No other fracture is noted. SOFT TISSUES: The soft tissues are unremarkable. IMPRESSION: 1. Displaced fracture of the superolateral left acetabulum with associated superiorly displaced femoral head dislocation. 2.  Evaluation of the femoral head is limited by overlapping bony structures; this will be better evaluated on the upcoming CT of the chest, abdomen, and pelvis Electronically signed by: Oneil Devonshire MD 05/27/2024 07:40 PM EST RP Workstation: [id number]   DG Chest Port 1 View Result Date: 05/27/2024 EXAM: 1 VIEW(S) XRAY OF THE CHEST 05/27/2024 07:18:00 PM COMPARISON: None available. CLINICAL HISTORY: Trauma FINDINGS: LUNGS AND PLEURA: Low lung volumes with bronchovascular crowding. No focal pulmonary opacity. No pleural effusion. No pneumothorax. HEART AND MEDIASTINUM: Enlarged cardiomediastinal silhouette, likely projectional. BONES AND SOFT TISSUES: No acute osseous abnormality. IMPRESSION: 1. No acute cardiopulmonary process. 2. Low lung volumes with bronchovascular crowding. 3. Enlarged cardiomediastinal silhouette, likely projectional. Electronically signed by: Oneil Devonshire MD 05/27/2024 07:37 PM EST RP Workstation: [id number]    Anti-infectives: Anti-infectives (From admission, onward)    Start     Dose/Rate Route Frequency Ordered Stop   05/28/24 0600  cefTRIAXone  (ROCEPHIN ) 1 g in sodium chloride  0.9 % 100 mL IVPB        1 g 200 mL/hr over 30 Minutes Intravenous Every 24 hours 05/28/24 0450 05/29/24 0650   05/27/24 2256  vancomycin  (VANCOCIN ) powder  Status:  Discontinued          As needed 05/27/24 2257 05/28/24 0017   05/27/24 1915  ceFAZolin  (ANCEF ) IVPB 2g/100 mL premix        2 g 200 mL/hr over 30 Minutes Intravenous STAT 05/27/24 1911 05/27/24 2000        Assessment/Plan Jump out of window   Suspected suicide attempt - psych consulted and following, recommend 1:1 observation, final dispo recs pending  L rib fx 3/5-7 - pain control, pulm toilet  L open tib-fib fx - per ortho, currently in ex-fix, to OR with Dr. Kendal today  Posterior L hip dislocation with associated acetabulum fx - per ortho, in traction, Dr. Kendal taking to OR today  FEN - NPO for OR, ok for reg diet  post-op DVT - SCDs, LMWH ID - rocephin  for open fx per ortho Dispo - 6N, OR with ortho today for LLE. Check labs pre-op. PT/OT post-op. Psych following as well for dispo planning.      LOS: 2 days   I reviewed Consultant psychiatry, orthopedic surgery notes, last 24 h vitals and pain scores, last 48 h intake and output, last 24 h labs and trends, and last 24 h imaging results.  This care required moderate level of medical decision making.    Burnard JONELLE Louder, [facility] 05/29/2024, 8:40 AM Please see Amion for pager number during day hours 7:00am-4:30pm

## 2024-05-29 NOTE — Anesthesia Preprocedure Evaluation (Signed)
 Anesthesia Evaluation  Patient identified by MRN, date of birth, ID band Patient awake    Reviewed: Allergy & Precautions, NPO status , Patient's Chart, lab work & pertinent test results  Airway Mallampati: II  TM Distance: >3 FB Neck ROM: Full    Dental  (+) Edentulous Upper, Edentulous Lower   Pulmonary neg pulmonary ROS, former smoker   Pulmonary exam normal        Cardiovascular hypertension, +CHF  + dysrhythmias Atrial Fibrillation  Rhythm:Regular Rate:Normal     Neuro/Psych negative neurological ROS  negative psych ROS   GI/Hepatic Neg liver ROS, PUD,GERD  Medicated,,  Endo/Other  diabetes, Type 2, Oral Hypoglycemic Agents    Renal/GU   negative genitourinary   Musculoskeletal  (+) Arthritis ,    Abdominal Normal abdominal exam  (+)   Peds  Hematology  (+) Blood dyscrasia, anemia   Anesthesia Other Findings   Reproductive/Obstetrics                             Anesthesia Physical Anesthesia Plan  ASA: 3  Anesthesia Plan: MAC and Regional   Post-op Pain Management:    Induction: Intravenous  PONV Risk Score and Plan: 1 and Ondansetron, Dexamethasone, Propofol infusion and Treatment may vary due to age or medical condition  Airway Management Planned: Simple Face Mask, Natural Airway and Nasal Cannula  Additional Equipment: None  Intra-op Plan:   Post-operative Plan:   Informed Consent: I have reviewed the patients History and Physical, chart, labs and discussed the procedure including the risks, benefits and alternatives for the proposed anesthesia with the patient or authorized representative who has indicated his/her understanding and acceptance.     Dental advisory given  Plan Discussed with: CRNA  Anesthesia Plan Comments:        Anesthesia Quick Evaluation

## 2024-05-30 ENCOUNTER — Encounter (HOSPITAL_COMMUNITY): Payer: Self-pay | Admitting: Student

## 2024-05-30 LAB — CBC
HCT: 23.7 % — ABNORMAL LOW (ref 36.0–46.0)
Hemoglobin: 7.8 g/dL — ABNORMAL LOW (ref 12.0–15.0)
MCH: 27 pg (ref 26.0–34.0)
MCHC: 32.9 g/dL (ref 30.0–36.0)
MCV: 82 fL (ref 80.0–100.0)
Platelets: 248 K/uL (ref 150–400)
RBC: 2.89 MIL/uL — ABNORMAL LOW (ref 3.87–5.11)
RDW: 13.3 % (ref 11.5–15.5)
WBC: 11.7 K/uL — ABNORMAL HIGH (ref 4.0–10.5)
nRBC: 0 % (ref 0.0–0.2)

## 2024-05-30 LAB — GLUCOSE, CAPILLARY
Glucose-Capillary: 188 mg/dL — ABNORMAL HIGH (ref 70–99)
Glucose-Capillary: 177 mg/dL — ABNORMAL HIGH (ref 70–99)
Glucose-Capillary: 175 mg/dL — ABNORMAL HIGH (ref 70–99)
Glucose-Capillary: 160 mg/dL — ABNORMAL HIGH (ref 70–99)

## 2024-05-30 LAB — COMPREHENSIVE METABOLIC PANEL WITH GFR
ALT: 30 U/L (ref 0–44)
AST: 65 U/L — ABNORMAL HIGH (ref 15–41)
Albumin: 3.7 g/dL (ref 3.5–5.0)
Alkaline Phosphatase: 53 U/L (ref 38–126)
Anion gap: 8 (ref 5–15)
BUN: <5 mg/dL — ABNORMAL LOW (ref 6–20)
CO2: 27 mmol/L (ref 22–32)
Calcium: 8.8 mg/dL — ABNORMAL LOW (ref 8.9–10.3)
Chloride: 102 mmol/L (ref 98–111)
Creatinine, Ser: 0.56 mg/dL (ref 0.44–1.00)
GFR, Estimated: >60 mL/min (ref >60)
Glucose, Bld: 155 mg/dL — ABNORMAL HIGH (ref 70–99)
Potassium: 3.5 mmol/L (ref 3.5–5.1)
Sodium: 137 mmol/L (ref 135–145)
Total Bilirubin: 0.5 mg/dL (ref 0.0–1.2)
Total Protein: 6.2 g/dL — ABNORMAL LOW (ref 6.5–8.1)

## 2024-05-30 LAB — PREPARE RBC (CROSSMATCH)

## 2024-05-30 MED ORDER — ENOXAPARIN SODIUM 30 MG/0.3ML IJ SOSY
30.0000 mg | PREFILLED_SYRINGE | Freq: Two times a day (BID) | INTRAMUSCULAR | Status: DC
  Administered 2024-05-30 – 2024-06-08 (×19): 30 mg via SUBCUTANEOUS
  Filled 2024-05-30 (×19): qty 0.3

## 2024-05-30 MED ORDER — SODIUM CHLORIDE 0.9% IV SOLUTION: Freq: Once | INTRAVENOUS | Status: AC

## 2024-05-30 MED ORDER — VITAMIN D (ERGOCALCIFEROL) 1.25 MG (50000 UNIT) PO CAPS
50000.0000 [IU] | ORAL_CAPSULE | ORAL | Status: DC
  Administered 2024-05-30 – 2024-06-06 (×2): 50000 [IU] via ORAL
  Filled 2024-05-30 (×2): qty 1

## 2024-05-30 NOTE — H&P (View-Only) (Signed)
 Orthopaedic Trauma Progress Note  SUBJECTIVE: Doing okay this afternoon.  Main complaint is pain in the left ankle.  Feels that her Ace wrap is too tight.  I have loosened the Ace wrap and the cast padding underneath, patient notes relief with this.  Pain in the hip present but manageable.  Denies any significant numbness or tingling throughout the left leg.  Has not been up out of bed yet since surgery.  Patient notes that her left leg tends to be her stronger leg.  She has had some prior issues with her right hip and knee.  She is concerned about how she will mobilize will she has nonweightbearing on the left leg.  No chest pain. No SOB. No nausea/vomiting. No other complaints.  Tolerating diet and fluids but is not had much of an appetite.  Patient's younger sister at bedside.  OBJECTIVE:  Vitals:   05/30/24 0700 05/30/24 1131  BP: 125/77 123/83  Pulse: (!) 104 (!) 105  Resp:    Temp: 97.8 F (36.6 C) 98.5 F (36.9 C)  SpO2: 95% 98%    Opiates Today (MME): Today's  total administered Morphine  Milligram Equivalents: 25 Opiates Yesterday (MME): Yesterday's total administered Morphine  Milligram Equivalents: 65  General: Sitting up in bed, no acute distress.  Pleasant and cooperative Respiratory: No increased work of breathing.  Operative Extremity (LLE): Dressing over the hip is clean, dry, and intact.  Ex-Fix dressing stable.  Loosened to patient's comfort.  Incisional VAC with good seal and function.  No output in canister.  Some soreness over the hip but not exquisitely tender.  Tolerates gentle hip flexion.  Moderate tenderness to the lower leg and ankle as expected.  Swelling stable.  Unable to get significant skin wrinkling.  Endorses sensation to light touch over the dorsal and plantar aspect of her foot.  Able to wiggle her toes.  Toes are warm and well-perfused. + DP pulse  IMAGING: Stable post op imaging left acetabulum.  Stable postop imaging left ankle s/p Ex-Fix  adjustment  LABS:  Results for orders placed or performed during the hospital encounter of 05/27/24 (from the past 24 hours)  Glucose, capillary     Status: Abnormal   Collection Time: 05/29/24  1:23 PM  Result Value Ref Range   Glucose-Capillary 201 (H) 70 - 99 mg/dL  Glucose, capillary     Status: Abnormal   Collection Time: 05/29/24  5:54 PM  Result Value Ref Range   Glucose-Capillary 250 (H) 70 - 99 mg/dL  Glucose, capillary     Status: Abnormal   Collection Time: 05/29/24  8:58 PM  Result Value Ref Range   Glucose-Capillary 206 (H) 70 - 99 mg/dL  CBC     Status: Abnormal   Collection Time: 05/30/24  2:10 AM  Result Value Ref Range   WBC 11.7 (H) 4.0 - 10.5 K/uL   RBC 2.89 (L) 3.87 - 5.11 MIL/uL   Hemoglobin 7.8 (L) 12.0 - 15.0 g/dL   HCT 76.2 (L) 63.9 - 53.9 %   MCV 82.0 80.0 - 100.0 fL   MCH 27.0 26.0 - 34.0 pg   MCHC 32.9 30.0 - 36.0 g/dL   RDW 86.6 88.4 - 84.4 %   Platelets 248 150 - 400 K/uL   nRBC 0.0 0.0 - 0.2 %  Comprehensive metabolic panel with GFR     Status: Abnormal   Collection Time: 05/30/24  6:01 AM  Result Value Ref Range   Sodium 137 135 - 145 mmol/L  Potassium 3.5 3.5 - 5.1 mmol/L   Chloride 102 98 - 111 mmol/L   CO2 27 22 - 32 mmol/L   Glucose, Bld 155 (H) 70 - 99 mg/dL   BUN <5 (L) 6 - 20 mg/dL   Creatinine, Ser 9.43 0.44 - 1.00 mg/dL   Calcium  8.8 (L) 8.9 - 10.3 mg/dL   Total Protein 6.2 (L) 6.5 - 8.1 g/dL   Albumin  3.7 3.5 - 5.0 g/dL   AST 65 (H) 15 - 41 U/L   ALT 30 0 - 44 U/L   Alkaline Phosphatase 53 38 - 126 U/L   Total Bilirubin 0.5 0.0 - 1.2 mg/dL   GFR, Estimated >39 >39 mL/min   Anion gap 8 5 - 15  Glucose, capillary     Status: Abnormal   Collection Time: 05/30/24  7:55 AM  Result Value Ref Range   Glucose-Capillary 188 (H) 70 - 99 mg/dL   Comment 1 Notify RN    Comment 2 Document in Chart   Prepare RBC (crossmatch)     Status: None   Collection Time: 05/30/24 11:03 AM  Result Value Ref Range   Order Confirmation       ORDER PROCESSED BY BLOOD BANK Performed at [facility], [address] [place], [place] [address]   Glucose, capillary     Status: Abnormal   Collection Time: 05/30/24 11:35 AM  Result Value Ref Range   Glucose-Capillary 177 (H) 70 - 99 mg/dL   Comment 1 Notify RN    Comment 2 Document in Chart     ASSESSMENT: Caitlyn [name] is a 43 y.o. female, 1 Day Post-Op s/p jump from window Procedures:  OPEN REDUCTION INTERNAL FIXATION LEFT TRANSVERSE/POSTERIOR WALL ACETABULUM FRACTURE DISLOCATION CLOSED REDUCTION LEFT DISTAL TIBIA FRACTURE WITH ADJUSTMENT OF EXTERNAL FIXATION   CV/Blood loss: Acute blood loss anemia, Hgb 7.8 this AM.  Hemodynamically stable  PLAN: Weightbearing: NWB LLE ROM: Okay for hip and knee ROM.  Maintain Ex-Fix. Incisional and dressing care: Reinforce dressings as needed.  Pin site dressing changes as needed Showering: Okay for bed bath Orthopedic device(s): Ex-Fix LLE Pain management:  1. Tylenol  [phone] mg q 6 hours  2. Robaxin  1000 mg q 8 hours 3. Oxycodone  5-10 mg q 4 hours PRN 4. Dilaudid  0.5 mg q 2 hours PRN 5. Neurontin  100 mg 3 times daily 6.  Lidoderm  5% patch every 24 hours VTE prophylaxis: Lovenox , SCDs ID:  Ancef  2gm post op per open fracture protocol Foley/Lines: Foley in place.  KVO IVFs Impediments to Fracture Healing: Vitamin D  level <6, started on supplementation  Dispo: PT/OT evaluation today as able.  Continue with aggressive ice and elevation for swelling control.  We will tentatively plan to return to the OR tomorrow for definitive fixation of left distal tibia/fibula pending OR availability and soft tissue swelling.  Will recheck swelling tomorrow AM.  I have made patient n.p.o. at midnight in case surgery is able to be done 05/31/2024.  Would like Hgb >8 for surgery. 1 unit PRBCs has been ordered per primary team anticipation of OR tomorrow.  Recheck CBC in AM.  Follow - up plan: TBD   Contact information:  Franky Light MD, Lauraine Moores PA-C. After hours and holidays please check [email or web address] for group call information for [facility]   Lauraine PATRIC Moores, PA-C [phone] (office) [email or web address]

## 2024-05-30 NOTE — Evaluation (Signed)
 Physical Therapy Evaluation Patient Details Name: Caitlyn [name] MRN: [id number] DOB: 1980/09/07 Today's Date: 05/30/2024  History of Present Illness  43 y.o. female admitted 05/27/24 after jumping from 3rd story window (suspected SI attempt vs ETOH abuse vs substance-induced mood disorder). Workup for L rib 3,5-7 fxs, L open tib-fib fx, and posterior hip dislocation with acetabulum fx. S/p L tibial ex fix with wound vac placement, L acetabular ORIF on 12/17. PMH includes chronic R hip pain, anemia, DM, HTN, OSA, remote smoker.   Clinical Impression  Pt presents with an overall decrease in functional mobility secondary to above. 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 Educ re: precautions, positioning, pulmonary hygiene, importance of mobility; pt pleasant and motivated. Today, pt requiring maxA for bed mobility, tolerating prolonged sitting EOB activity; will require +2 assist for transfers, suspect pt to have increased difficulty maintaining LLE NWB precautions with ex-fix. Pt limited by pain, generalized weakness, decreased activity tolerance, impaired balance strategies. Pt would benefit from intensive post-acute rehab services (> 3 hrs/day) to maximize functional mobility and independence. Will follow acutely to address established goals.       If plan is discharge home, recommend the following: Two people to help with walking and/or transfers;A lot of help with bathing/dressing/bathroom;Assistance with cooking/housework;Assist for transportation;Help with stairs or ramp for entrance   Can travel by private vehicle   No     Equipment Recommendations  (TBD - potential RW, wheelchair, BSC)  Recommendations for Other Services  OT consult;Rehab consult    Functional Status Assessment Patient has had a recent decline in their functional status and demonstrates the ability to make significant  improvements in function in a reasonable and predictable amount of time.     Precautions / Restrictions Precautions Precautions: Fall;Other (comment) Recall of Precautions/Restrictions: Intact Precaution/Restrictions Comments: LLE ex-fix & wound vac, L rib fxs Restrictions Weight Bearing Restrictions Per Provider Order: Yes LLE Weight Bearing Per Provider Order: Non weight bearing      Mobility  Bed Mobility Overal bed mobility: Needs Assistance Bed Mobility: Supine to Sit, Sit to Supine     Supine to sit: Max assist Sit to supine: Max assist   General bed mobility comments: maxA for LLE management, scooting hips and modA for trunk elevation; significant increased time and effort secondary to pain, frequent cues for breathing and relaxation, cues for sequencing, heavy reliance on bed rail use    Transfers Overall transfer level: Needs assistance                 General transfer comment: lateral scoot along EOB with maxA, ability to push through BUEs limited by rib pain; will require +2 assist to attempt stand, do not expect pt to be able to maintain LLE NWB precautions    Ambulation/Gait                  Stairs            Wheelchair Mobility     Tilt Bed    Modified Rankin (Stroke Patients Only)       Balance Overall balance assessment: Needs assistance Sitting-balance support: No upper extremity supported, Feet unsupported Sitting balance-Leahy Scale: Fair Sitting balance - Comments: tolerates prolonged sitting EOB with and without UE support  Pertinent Vitals/Pain Pain Assessment Pain Assessment: Faces Faces Pain Scale: Hurts whole lot Pain Location: L-side ribs, LLE, groin Pain Descriptors / Indicators: Discomfort, Grimacing, Guarding, Moaning, Sore Pain Intervention(s): Premedicated before session, Limited activity within patient's tolerance, Monitored during session, Repositioned,  Relaxation    Home Living Family/patient expects to be discharged to:: Private residence Living Arrangements: Spouse/significant other;Children Available Help at Discharge: Family;Available PRN/intermittently Type of Home: Apartment Home Access: Level entry       Home Layout: Multi-level Home Equipment: [place] - single point Additional Comments: lives on 3rd floor apartment. 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 ADLs Comments: indep ADL/iADLs     Extremity/Trunk Assessment   Upper Extremity Assessment Upper Extremity Assessment: Generalized weakness (functional observed BUE strength >3/5 though AROM limited by L-side rib/abdominal pain)    Lower Extremity Assessment Lower Extremity Assessment: RLE deficits/detail;LLE deficits/detail RLE Deficits / Details: h/o R hip injury (pt reports recently dx with hip tears) and R hip/knee buckling, painful; gross knee strength >3/5, gross hip strength >/ 3-/5 LLE Deficits / Details: s/p L acetabular ORIF and L tibial ex-fix with expected post-op pain and weakness; gross hip strength < 3/5, able to fully extend L knee sitting EOB, able to wiggle toes LLE: Unable to fully assess due to immobilization       Communication   Communication Communication: No apparent difficulties    Cognition Arousal: Alert Behavior During Therapy: WFL for tasks assessed/performed   PT - Cognitive impairments: No apparent impairments                         Following commands: Intact       Cueing Cueing Techniques: Verbal cues     General Comments General comments (skin integrity,  edema, etc.): educ re: role of acute PT, POC, precautions, positioning, edema control, AROM, pulmonary hygiene, importance of mobility, potential d/c needs and DME use    Exercises Other Exercises Other Exercises: seated BLE LAQ, supine BLE quad sets Other Exercises: incentive spirometer x8, pt pulling ~[phone] mL with good technique   Assessment/Plan    PT Assessment Patient needs continued PT services  PT Problem List Decreased strength;Decreased range of motion;Decreased activity tolerance;Decreased balance;Decreased mobility;Decreased knowledge of use of DME;Decreased knowledge of precautions;Pain       PT Treatment Interventions DME instruction;Gait training;Stair training;Functional mobility training;Therapeutic activities;Therapeutic exercise;Balance training;Neuromuscular re-education;Patient/family education;Wheelchair mobility training    PT Goals (Current goals can be found in the Care Plan section)  Acute Rehab PT Goals Patient Stated Goal: decreased pain, regain strength, get back home PT Goal Formulation: With patient Time For Goal Achievement: 06/13/24 Potential to Achieve Goals: Good    Frequency Min 3X/week     Co-evaluation               AM-PAC PT 6 Clicks Mobility  Outcome Measure Help needed turning from your back to your side while in a flat bed without using bedrails?: A Lot Help needed moving from lying on your back to sitting on the side of a flat  bed without using bedrails?: A Lot Help needed moving to and from a bed to a chair (including a wheelchair)?: Total Help needed standing up from a chair using your arms (e.g., wheelchair or bedside chair)?: Total Help needed to walk in hospital room?: Total Help needed climbing 3-5 steps with a railing? : Total 6 Click Score: 8    End of Session   Activity Tolerance: Patient tolerated treatment well;Patient limited by pain Patient left: in bed;with call bell/phone within reach;with bed alarm  set Nurse Communication: Mobility status;Patient requests pain meds;Weight bearing status PT Visit Diagnosis: Other abnormalities of gait and mobility (R26.89);Muscle weakness (generalized) (M62.81);Pain Pain - Right/Left: Left Pain - part of body: Leg    Time: 9099-9064 PT Time Calculation (min) (ACUTE ONLY): 35 min   Charges:   PT Evaluation $PT Eval Moderate Complexity: 1 Mod PT Treatments $Therapeutic Activity: 8-22 mins PT General Charges $$ ACUTE PT VISIT: 1 Visit       Darice Almas, PT, DPT Acute Rehabilitation Services  Personal: Secure Chat Rehab Office: [phone]  Darice LITTIE Almas 05/30/2024, 12:11 PM

## 2024-05-30 NOTE — Progress Notes (Signed)
 Orthopaedic Trauma Progress Note  SUBJECTIVE: Doing okay this afternoon.  Main complaint is pain in the left ankle.  Feels that her Ace wrap is too tight.  I have loosened the Ace wrap and the cast padding underneath, patient notes relief with this.  Pain in the hip present but manageable.  Denies any significant numbness or tingling throughout the left leg.  Has not been up out of bed yet since surgery.  Patient notes that her left leg tends to be her stronger leg.  She has had some prior issues with her right hip and knee.  She is concerned about how she will mobilize will she has nonweightbearing on the left leg.  No chest pain. No SOB. No nausea/vomiting. No other complaints.  Tolerating diet and fluids but is not had much of an appetite.  Patient's younger sister at bedside.  OBJECTIVE:  Vitals:   05/30/24 0700 05/30/24 1131  BP: 125/77 123/83  Pulse: (!) 104 (!) 105  Resp:    Temp: 97.8 F (36.6 C) 98.5 F (36.9 C)  SpO2: 95% 98%    Opiates Today (MME): Today's  total administered Morphine  Milligram Equivalents: 25 Opiates Yesterday (MME): Yesterday's total administered Morphine  Milligram Equivalents: 65  General: Sitting up in bed, no acute distress.  Pleasant and cooperative Respiratory: No increased work of breathing.  Operative Extremity (LLE): Dressing over the hip is clean, dry, and intact.  Ex-Fix dressing stable.  Loosened to patient's comfort.  Incisional VAC with good seal and function.  No output in canister.  Some soreness over the hip but not exquisitely tender.  Tolerates gentle hip flexion.  Moderate tenderness to the lower leg and ankle as expected.  Swelling stable.  Unable to get significant skin wrinkling.  Endorses sensation to light touch over the dorsal and plantar aspect of her foot.  Able to wiggle her toes.  Toes are warm and well-perfused. + DP pulse  IMAGING: Stable post op imaging left acetabulum.  Stable postop imaging left ankle s/p Ex-Fix  adjustment  LABS:  Results for orders placed or performed during the hospital encounter of 05/27/24 (from the past 24 hours)  Glucose, capillary     Status: Abnormal   Collection Time: 05/29/24  1:23 PM  Result Value Ref Range   Glucose-Capillary 201 (H) 70 - 99 mg/dL  Glucose, capillary     Status: Abnormal   Collection Time: 05/29/24  5:54 PM  Result Value Ref Range   Glucose-Capillary 250 (H) 70 - 99 mg/dL  Glucose, capillary     Status: Abnormal   Collection Time: 05/29/24  8:58 PM  Result Value Ref Range   Glucose-Capillary 206 (H) 70 - 99 mg/dL  CBC     Status: Abnormal   Collection Time: 05/30/24  2:10 AM  Result Value Ref Range   WBC 11.7 (H) 4.0 - 10.5 K/uL   RBC 2.89 (L) 3.87 - 5.11 MIL/uL   Hemoglobin 7.8 (L) 12.0 - 15.0 g/dL   HCT 76.2 (L) 63.9 - 53.9 %   MCV 82.0 80.0 - 100.0 fL   MCH 27.0 26.0 - 34.0 pg   MCHC 32.9 30.0 - 36.0 g/dL   RDW 86.6 88.4 - 84.4 %   Platelets 248 150 - 400 K/uL   nRBC 0.0 0.0 - 0.2 %  Comprehensive metabolic panel with GFR     Status: Abnormal   Collection Time: 05/30/24  6:01 AM  Result Value Ref Range   Sodium 137 135 - 145 mmol/L  Potassium 3.5 3.5 - 5.1 mmol/L   Chloride 102 98 - 111 mmol/L   CO2 27 22 - 32 mmol/L   Glucose, Bld 155 (H) 70 - 99 mg/dL   BUN <5 (L) 6 - 20 mg/dL   Creatinine, Ser 9.43 0.44 - 1.00 mg/dL   Calcium  8.8 (L) 8.9 - 10.3 mg/dL   Total Protein 6.2 (L) 6.5 - 8.1 g/dL   Albumin  3.7 3.5 - 5.0 g/dL   AST 65 (H) 15 - 41 U/L   ALT 30 0 - 44 U/L   Alkaline Phosphatase 53 38 - 126 U/L   Total Bilirubin 0.5 0.0 - 1.2 mg/dL   GFR, Estimated >39 >39 mL/min   Anion gap 8 5 - 15  Glucose, capillary     Status: Abnormal   Collection Time: 05/30/24  7:55 AM  Result Value Ref Range   Glucose-Capillary 188 (H) 70 - 99 mg/dL   Comment 1 Notify RN    Comment 2 Document in Chart   Prepare RBC (crossmatch)     Status: None   Collection Time: 05/30/24 11:03 AM  Result Value Ref Range   Order Confirmation       ORDER PROCESSED BY BLOOD BANK Performed at [facility], [address] [place], [place] [address]   Glucose, capillary     Status: Abnormal   Collection Time: 05/30/24 11:35 AM  Result Value Ref Range   Glucose-Capillary 177 (H) 70 - 99 mg/dL   Comment 1 Notify RN    Comment 2 Document in Chart     ASSESSMENT: Caitlyn [name] is a 43 y.o. female, 1 Day Post-Op s/p jump from window Procedures:  OPEN REDUCTION INTERNAL FIXATION LEFT TRANSVERSE/POSTERIOR WALL ACETABULUM FRACTURE DISLOCATION CLOSED REDUCTION LEFT DISTAL TIBIA FRACTURE WITH ADJUSTMENT OF EXTERNAL FIXATION   CV/Blood loss: Acute blood loss anemia, Hgb 7.8 this AM.  Hemodynamically stable  PLAN: Weightbearing: NWB LLE ROM: Okay for hip and knee ROM.  Maintain Ex-Fix. Incisional and dressing care: Reinforce dressings as needed.  Pin site dressing changes as needed Showering: Okay for bed bath Orthopedic device(s): Ex-Fix LLE Pain management:  1. Tylenol  [phone] mg q 6 hours  2. Robaxin  1000 mg q 8 hours 3. Oxycodone  5-10 mg q 4 hours PRN 4. Dilaudid  0.5 mg q 2 hours PRN 5. Neurontin  100 mg 3 times daily 6.  Lidoderm  5% patch every 24 hours VTE prophylaxis: Lovenox , SCDs ID:  Ancef  2gm post op per open fracture protocol Foley/Lines: Foley in place.  KVO IVFs Impediments to Fracture Healing: Vitamin D  level <6, started on supplementation  Dispo: PT/OT evaluation today as able.  Continue with aggressive ice and elevation for swelling control.  We will tentatively plan to return to the OR tomorrow for definitive fixation of left distal tibia/fibula pending OR availability and soft tissue swelling.  Will recheck swelling tomorrow AM.  I have made patient n.p.o. at midnight in case surgery is able to be done 05/31/2024.  Would like Hgb >8 for surgery. 1 unit PRBCs has been ordered per primary team anticipation of OR tomorrow.  Recheck CBC in AM.  Follow - up plan: TBD   Contact information:  Franky Light MD, Lauraine Moores PA-C. After hours and holidays please check [email or web address] for group call information for [facility]   Lauraine PATRIC Moores, PA-C [phone] (office) [email or web address]

## 2024-05-30 NOTE — Progress Notes (Signed)
 Progress Note  1 Day Post-Op  Subjective: Pt calm and alert. Reports pain in LLE and concerned ACE is too tight. Able to move L toes but causes pain in L ankle. She reports some pain in L chest but less and able to move LUE more today. She has been using IS. Having bowel function.   Sitter at bedside.   Objective: Vital signs in last 24 hours: Temp:  [97.8 F (36.6 C)-100 F (37.8 C)] 97.8 F (36.6 C) (12/18 0700) Pulse Rate:  [82-104] 104 (12/18 0700) Resp:  [10-14] 12 (12/17 1400) BP: (118-145)/(71-96) 125/77 (12/18 0700) SpO2:  [91 %-100 %] 95 % (12/18 0700) Last BM Date : 05/30/24  Intake/Output from previous day: 12/17 0701 - 12/18 0700 In: 1320 [P.O.:120; I.V.:850; IV Piggyback:350] Out: 1500 [Urine:1350; Blood:150] Intake/Output this shift: No intake/output data recorded.  PE: General: pleasant, WD, obese female who is laying in bed in NAD HEENT: head is normocephalic, atraumatic.  Sclera are noninjected.  Pupils equal and round.   Heart: regular, rate, and rhythm. Palpable radial and pedal pulses bilaterally Lungs: No wheezes, rhonchi, or rales noted.  Respiratory effort nonlabored Abd: soft, NT, ND MS: Ex-fix present to LLE without significant drainage from pin sites; L toes WWP and NVI, ACE checked and appropriately applied, VAC present  Psych: A&Ox3 with an appropriate affect.    Lab Results:  Recent Labs    05/29/24 0833 05/30/24 0210  WBC 11.7* 11.7*  HGB 8.5* 7.8*  HCT 25.8* 23.7*  PLT 257 248   BMET Recent Labs    05/29/24 0833 05/30/24 0601  NA 135 137  K 3.5 3.5  CL 101 102  CO2 22 27  GLUCOSE 226* 155*  BUN 6 <5*  CREATININE 0.57 0.56  CALCIUM  9.1 8.8*   PT/INR Recent Labs    05/27/24 1910  LABPROT 13.0  INR 0.9   CMP     Component Value Date/Time   NA 137 05/30/2024 0601   NA 135 01/12/2023 1038   K 3.5 05/30/2024 0601   CL 102 05/30/2024 0601   CO2 27 05/30/2024 0601   GLUCOSE 155 (H) 05/30/2024 0601   GLUCOSE 107  (H) 09/09/2013 1237   BUN <5 (L) 05/30/2024 0601   BUN 9 01/12/2023 1038   CREATININE 0.56 05/30/2024 0601   CREATININE 0.62 02/03/2014 0931   CALCIUM  8.8 (L) 05/30/2024 0601   PROT 6.2 (L) 05/30/2024 0601   PROT 7.1 01/12/2023 1038   ALBUMIN  3.7 05/30/2024 0601   ALBUMIN  4.4 01/12/2023 1038   AST 65 (H) 05/30/2024 0601   ALT 30 05/30/2024 0601   ALKPHOS 53 05/30/2024 0601   BILITOT 0.5 05/30/2024 0601   BILITOT 0.3 01/12/2023 1038   GFRNONAA >60 05/30/2024 0601   GFRAA >60 07/02/2019 1311   Lipase     Component Value Date/Time   LIPASE 15 10/17/2008 0950       Studies/Results: DG Pelvis Comp Min 3V Result Date: 05/29/2024 EXAM: 3 VIEW(S) XRAY OF THE PELVIS 05/29/2024 04:36:00 PM COMPARISON: Comparison with 05/28/2024. CLINICAL HISTORY: 243000 Acetabular fracture (HCC) 243000 Acetabular fracture (HCC) FINDINGS: BONES AND JOINTS: Interval postoperative changes with realignment of the left acetabulum and hip with plate and screw fixation of the acetabulum. Near-anatomic alignment is suggested. Surgical hardware appears intact. SI joints and symphysis pubis are not displaced. SOFT TISSUES: Soft tissue gas is present, likely resulting from recent surgery. IMPRESSION: 1. Interval postoperative changes with realignment of the left acetabulum and hip with plate and  screw fixation of the acetabulum, with near-anatomic alignment suggested and intact hardware. 2. Soft tissue gas, likely resulting from recent surgery. Electronically signed by: Elsie Gravely MD 05/29/2024 05:23 PM EST RP Workstation: [id number]   DG Ankle Complete Left Result Date: 05/29/2024 EXAM: 3 OR MORE VIEW(S) XRAY OF THE LEFT ANKLE 05/29/2024 04:36:00 PM CLINICAL HISTORY: 243000 Acetabular fracture (HCC) 243000 Acetabular fracture (HCC) COMPARISON: Comparison with 05/28/2024. FINDINGS: BONES AND JOINTS: Postoperative changes with external fixator through the calcaneus. Multiple comminuted fractures were demonstrated in  the visualized distal left tibia centered on the tibial metaphysis but with fracture lines extending to the articular surface. There is superior overriding and lateral displacement of distal fracture fragments. There is mild posterior displacement of distal fracture fragments. Transverse fractures of the distal fibula with greater than 1 shaft width lateral displacement and about 2 cm lateral overriding of the distal fracture fragment. SOFT TISSUES: Diffuse soft tissue swelling. IMPRESSION: 1. Multiple comminuted fractures of the distal left tibia with superior overriding, lateral displacement, and mild posterior displacement of distal fracture fragments. 2. Transverse fractures of the distal fibula with greater than 1 shaft width lateral displacement and about 2 cm lateral overriding of the distal fracture fragment. 3. Postoperative changes with external fixator through the calcaneus. 4. Diffuse soft tissue swelling. Electronically signed by: Elsie Gravely MD 05/29/2024 05:20 PM EST RP Workstation: [id number]   DG Pelvis Comp Min 3V Result Date: 05/29/2024 EXAM: 5 INTRAOPERATIVE FLUOROSCOPIC VIEWS OF THE LEFT HIP 05/29/2024 01:10:34 PM COMPARISON: None available. CLINICAL HISTORY: 461500 Elective surgery 461500 Elective surgery 461500 FINDINGS: BONES AND JOINTS: Fixation plates are seen along the acetabulum. The alignment is grossly anatomic. SOFT TISSUES: The soft tissues are unremarkable. TECHNICAL DETAILS: Fluoroscopy time 47.3 seconds. Fluoroscopy dose 4.95 microgray. IMPRESSION: 1. Intraoperative fluoroscopic views of the left hip demonstrate fixation plates along the acetabulum with grossly anatomic alignment. Electronically signed by: Greig Pique MD 05/29/2024 05:18 PM EST RP Workstation: [id number]   DG Ankle Complete Left Result Date: 05/29/2024 EXAM: 3 OR MORE VIEW(S) XRAY OF THE LEFT ANKLE 05/29/2024 01:10:34 PM CLINICAL HISTORY: 461500 Elective surgery 461500 Elective surgery 461500  COMPARISON: Comparison with 05/28/2024. FINDINGS: FLUOROSCOPY: Intraoperative fluoroscopy was utilized for surgical control purposes. Fluoroscopy time: 47.3 seconds. Dose: 4.95 mGy. 2 spot fluoroscopic images obtained. Fluoroscopic images are demonstrating internal/external fixation of comminuted fractures of the distal tibia and fibula. IMPRESSION: 1. Intraoperative fluoroscopy for surgical control purposes, demonstrating internal/external fixation of comminuted fractures of the distal tibia and fibula. Electronically signed by: Elsie Gravely MD 05/29/2024 05:18 PM EST RP Workstation: [id number]   DG C-Arm 1-60 Min-No Report Result Date: 05/29/2024 Fluoroscopy was utilized by the requesting physician.  No radiographic interpretation.   DG C-Arm 1-60 Min-No Report Result Date: 05/29/2024 Fluoroscopy was utilized by the requesting physician.  No radiographic interpretation.   DG C-Arm 1-60 Min-No Report Result Date: 05/29/2024 Fluoroscopy was utilized by the requesting physician.  No radiographic interpretation.   DG HIP UNILAT WITH PELVIS 1V LEFT Result Date: 05/28/2024 EXAM: 1 VIEW(S) XRAY OF THE LEFT HIP 05/28/2024 06:34:00 PM COMPARISON: None available. CLINICAL HISTORY: Pain FINDINGS: BONES AND JOINTS: Superolateral acetabular fracture. The femoral head is now well seated within the acetabulum. SOFT TISSUES: The soft tissues are unremarkable. IMPRESSION: 1. Superolateral left acetabular fracture. 2. No dislocation. . Electronically signed by: Greig Pique MD 05/28/2024 06:43 PM EST RP Workstation: [id number]   DG Pelvis Comp Min 3V Result Date: 05/28/2024 CLINICAL DATA:  Follow-up acetabular fracture. The patient is currently in  traction, making positioning difficult. EXAM: JUDET PELVIS - 3+ VIEW COMPARISON:  05/27/2024 radiographs and CT. FINDINGS: The bone detail is not as well visualized on the current images due to overlying bowel-gas. The previously demonstrated displaced acetabular  fracture and posterior/superior dislocation of the femoral head have not changed significantly. No additional fractures are visualized. IMPRESSION: Grossly unchanged displaced acetabular fracture and posterior/superior dislocation of the femoral head. Electronically Signed   By: Elspeth Bathe M.D.   On: 05/28/2024 16:46    Anti-infectives: Anti-infectives (From admission, onward)    Start     Dose/Rate Route Frequency Ordered Stop   05/29/24 2200  ceFAZolin  (ANCEF ) IVPB 2g/100 mL premix        2 g 200 mL/hr over 30 Minutes Intravenous Every 8 hours 05/29/24 1818 05/30/24 2159   05/29/24 1116  vancomycin  (VANCOCIN ) powder  Status:  Discontinued          As needed 05/29/24 1117 05/29/24 1314   05/28/24 0600  cefTRIAXone  (ROCEPHIN ) 1 g in sodium chloride  0.9 % 100 mL IVPB        1 g 200 mL/hr over 30 Minutes Intravenous Every 24 hours 05/28/24 0450 05/29/24 0650   05/27/24 2256  vancomycin  (VANCOCIN ) powder  Status:  Discontinued          As needed 05/27/24 2257 05/28/24 0017   05/27/24 1915  ceFAZolin  (ANCEF ) IVPB 2g/100 mL premix        2 g 200 mL/hr over 30 Minutes Intravenous STAT 05/27/24 1911 05/27/24 2000        Assessment/Plan Jump out of window   Suspected suicide attempt - psych consulted and following, recommend 1:1 observation, inpatient recommended currently  L rib fx 3/5-7 - pain control, pulm toilet  L open tib-fib fx - per Dr. Kendal, s/p closed reduction and washout with VAC placement yesterday - will need OR tomorrow or Monday pending edema and OR availability  Posterior L hip dislocation with associated acetabulum fx - per Dr. Kendal, s/p ORIF yesterday ABL anemia - hgb 7.8 from 10.3 on admit, continue to monitor; will discuss with ortho if we should transfuse pre-op  FEN - reg diet DVT - SCDs, LMWH ID - rocephin  for open fx per ortho Dispo - 6N, PT/OT. Further surgical plans per ortho trauma. Psych following as well for dispo planning.      LOS: 3 days   I  reviewed Consultant psychiatry, orthopedic surgery notes, last 24 h vitals and pain scores, last 48 h intake and output, last 24 h labs and trends, and last 24 h imaging results.  This care required moderate level of medical decision making.    Burnard JONELLE Louder, [facility] 05/30/2024, 9:20 AM Please see Amion for pager number during day hours 7:00am-4:30pm

## 2024-05-30 NOTE — Progress Notes (Signed)
 Inpatient Rehab Admissions Coordinator:   Per therapy recommendations pt was screened for CIR by Reche Lowers, PT, DPT.  Note pt recommended for inpatient psych.  We can not manage pts with active psychiatric needs on CIR.    Reche Lowers, PT, DPT Admissions Coordinator [phone] 05/30/2024 2:19 PM

## 2024-05-30 NOTE — TOC Progression Note (Signed)
 Transition of Care [facility]) - Progression Note    Patient Details  Name: Caitlyn [name] MRN: [id number] Date of Birth: [birth year]  Transition of Care [facility]) CM/SW Contact  Carmelita FORBES Carbon, LCSW Phone Number: 05/30/2024, 3:23 PM  Clinical Narrative:    Noted rec for Inpatient Psych. Asked [facility] if patient would be a candidate for [facility] or [facility]. Awaiting response. If not, will need external send out.   Expected Discharge Plan: Psychiatric Hospital Barriers to Discharge: Continued Medical Work up               Expected Discharge Plan and Services   Discharge Planning Services: CM Consult   Living arrangements for the past 2 months: Apartment                                       Social Drivers of Health (SDOH) Interventions SDOH Screenings   Food Insecurity: No Food Insecurity (05/28/2024)  Housing: Low Risk (05/28/2024)  Transportation Needs: No Transportation Needs (05/28/2024)  Utilities: Not At Risk (05/28/2024)  Alcohol Screen: Low Risk (10/14/2022)  Depression (PHQ2-9): Low Risk (10/14/2022)  Financial Resource Strain: Medium Risk (10/14/2022)  Physical Activity: Unknown (10/14/2022)  Social Connections: Moderately Isolated (10/14/2022)  Stress: No Stress Concern Present (10/14/2022)  Tobacco Use: Medium Risk (05/29/2024)    Readmission Risk Interventions     No data to display

## 2024-05-30 NOTE — Evaluation (Signed)
 Occupational Therapy Evaluation Patient Details Name: Caitlyn [name] MRN: [id number] DOB: 1981-01-30 Today's Date: 05/30/2024   History of Present Illness   43 y.o. female admitted 05/27/24 after jumping from 3rd story window (suspected SI attempt vs ETOH abuse vs substance-induced mood disorder). Workup for L rib 3,5-7 fxs, L open tib-fib fx, and posterior hip dislocation with acetabulum fx. S/p L tibial ex fix with wound vac placement, L acetabular ORIF on 12/17. PMH includes chronic R hip pain, anemia, DM, HTN, OSA, remote smoker.     Clinical Impressions Pt presents with decline in function and safety with ADLs and ADL mobility with impaired strength, balance and endurance; pt limited by pain at multiple sites including L LE and rib fx areas. 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 Pt currently requires max A with rolling, unable to complete sup-sit due to pain, min A with UB ADLs at bed level and total A with LB ADLs and toileting. Pt will need +2 assist with STS/SPTs. OT will follow acutely to maximize level of function and safety     If plan is discharge home, recommend the following:   A lot of help with bathing/dressing/bathroom;Two people to help with walking and/or transfers;Assistance with cooking/housework;Assist for transportation;Help with stairs or ramp for entrance     Functional Status Assessment   Patient has had a recent decline in their functional status and demonstrates the ability to make significant improvements in function in a reasonable and predictable amount of time.     Equipment Recommendations   BSC/3in1;Wheelchair (measurements OT);Wheelchair cushion (measurements OT);Other (comment)  (TBD)     Recommendations for Other Services   Rehab consult     Precautions/Restrictions   Precautions Precautions: Fall;Other (comment) Recall of Precautions/Restrictions: Intact Precaution/Restrictions Comments: LLE ex-fix & wound vac, L rib fxs Restrictions Weight Bearing Restrictions Per Provider Order: Yes LLE Weight Bearing Per Provider Order: Non weight bearing     Mobility Bed Mobility Overal bed mobility: Needs Assistance Bed Mobility: Rolling Rolling: Max assist   Supine to sit: Max assist     General bed mobility comments: attempted sup-sit max A, pt unable to complete due to pain. Recently up to EOB with PT. Also attempted long sitting in bed max A, unable to maintain    Transfers                   General transfer comment: unable, will require +2 assist      Balance                                           ADL either performed or assessed with clinical judgement   ADL Overall ADL's : Needs assistance/impaired Eating/Feeding: Independent;Sitting;Bed level   Grooming: Wash/dry hands;Wash/dry face;Set up;Supervision/safety;Sitting;Bed level   Upper Body Bathing: Minimal assistance;Bed level   Lower Body Bathing: Total assistance;Bed level   Upper Body Dressing : Minimal assistance;Bed level   Lower Body Dressing: Total assistance;Bed level     Toilet Transfer Details (indicate cue type and reason): will need +2 assist Toileting- Clothing Manipulation and Hygiene: Total assistance;Bed level  General ADL Comments: pt anxious and limited by pain. recently up to EOB with PT. Initiated A/E education     Vision Ability to See in Adequate Light: 0 Adequate Patient Visual Report: No change from baseline       Perception         Praxis         Pertinent Vitals/Pain Pain Assessment Pain Assessment: 0-10 Pain Score: 8  Pain Location: L-side ribs, LLE, groin Pain Descriptors / Indicators: Discomfort,  Grimacing, Guarding, Sore Pain Intervention(s): Limited activity within patient's tolerance, Monitored during session, Premedicated before session, Repositioned, Relaxation     Extremity/Trunk Assessment Upper Extremity Assessment Upper Extremity Assessment: Generalized weakness;Left hand dominant (AROM WFL, limited by pain)   Lower Extremity Assessment Lower Extremity Assessment: Defer to PT evaluation RLE Deficits / Details: h/o R hip injury (pt reports recently dx with hip tears) and R hip/knee buckling, painful; gross knee strength >3/5, gross hip strength >/ 3-/5 LLE Deficits / Details: s/p L acetabular ORIF and L tibial ex-fix with expected post-op pain and weakness; gross hip strength < 3/5, able to fully extend L knee sitting EOB, able to wiggle toes LLE: Unable to fully assess due to immobilization       Communication Communication Communication: No apparent difficulties   Cognition Arousal: Alert Behavior During Therapy: WFL for tasks assessed/performed, Anxious                                 Following commands: Intact       Cueing  General Comments   Cueing Techniques: Verbal cues  educ re: role of acute PT, POC, precautions, positioning, edema control, AROM, pulmonary hygiene, importance of mobility, potential d/c needs and DME use   Exercises     Shoulder Instructions      Home Living Family/patient expects to be discharged to:: Private residence Living Arrangements: Spouse/significant other;Children Available Help at Discharge: Family;Available PRN/intermittently Type of Home: Apartment Home Access: Level entry     Home Layout: Multi-level     Bathroom Shower/Tub: Chief Strategy Officer: Standard     Home Equipment: [place] - single point   Additional Comments: lives on 3rd floor apartment. 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; mobility and work limited by chronic hip pain (reports she was dx with hip tear recently, though no h/o trauma to hip) and was told to use [facility] ADLs Comments: Ind with ADLs, IADLs, cooking, home mgt    OT Problem List:     OT Treatment/Interventions: Self-care/ADL training;Patient/family education;Balance training;Therapeutic activities;DME and/or AE instruction      OT Goals(Current goals can be found in the care plan section)   Acute Rehab OT Goals Patient Stated Goal: less pain, get better OT Goal Formulation: With patient Time For Goal Achievement: 06/13/24 Potential to Achieve Goals: Good ADL Goals Pt Will Perform Grooming: with contact guard assist;with supervision;sitting Pt Will Perform Upper Body Bathing: with min assist;with contact guard assist;sitting Pt Will Perform Lower Body Bathing: with max assist;with mod assist;sitting/lateral leans;with adaptive equipment Pt Will Perform Upper Body Dressing: with min assist;with contact guard assist;sitting Pt Will Transfer  to Toilet: with max assist;with mod assist;with +2 assist;stand pivot transfer;bedside commode   OT Frequency:  Min 2X/week    Co-evaluation              AM-PAC OT 6 Clicks Daily Activity     Outcome Measure Help from another person eating meals?: None Help from another person taking care of personal grooming?: A Little Help from another person toileting, which includes using toliet, bedpan, or urinal?: Total Help from another person bathing (including washing, rinsing, drying)?: A Lot Help from another person to put on and taking off regular upper body clothing?: A Little Help from another person to put on and taking off regular lower body clothing?: Total 6 Click Score: 14   End of Session    Activity Tolerance: Patient  limited by pain Patient left: in bed;with call bell/phone within reach;with nursing/sitter in room  OT Visit Diagnosis: Other abnormalities of gait and mobility (R26.89);Muscle weakness (generalized) (M62.81);Pain Pain - Right/Left:  (multiple sites) Pain - part of body: Arm;Leg;Ankle and joints of foot (rib fx areas)                Time: 8985-8960 OT Time Calculation (min): 25 min Charges:  OT General Charges $OT Visit: 1 Visit OT Evaluation $OT Eval Moderate Complexity: 1 Mod OT Treatments $Therapeutic Activity: 8-22 mins    Jacques Karna Loose 05/30/2024, 12:41 PM

## 2024-05-30 NOTE — Anesthesia Postprocedure Evaluation (Signed)
 Anesthesia Post Note  Patient: Caitlyn [name]  Procedure(s) Performed: OPEN REDUCTION INTERNAL FIXATION ACETABULUM POSTERIOR LATERAL (Left: Hip) ADJUSTMENT EXTERNAL FIXATION, ANKLE (Left)     Patient location during evaluation: PACU Anesthesia Type: General Level of consciousness: awake and patient cooperative Pain management: pain level controlled Vital Signs Assessment: post-procedure vital signs reviewed and stable Respiratory status: spontaneous breathing, nonlabored ventilation and respiratory function stable Cardiovascular status: blood pressure returned to baseline and stable Postop Assessment: no apparent nausea or vomiting Anesthetic complications: no   No notable events documented.                Ariana Cavenaugh

## 2024-05-30 NOTE — Evaluation (Signed)
 Speech Language Pathology Evaluation Patient Details Name: Caitlyn [name] MRN: [id number] DOB: 1980-12-22 Today's Date: 05/30/2024 Time: 1202-1213 SLP Time Calculation (min) (ACUTE ONLY): 11 min  Problem List:  Patient Active Problem List   Diagnosis Date Noted   Dislocation of hip, posterior, left, closed (HCC) 05/28/2024   Open pilon fracture, left, type I or II, initial encounter 05/28/2024   Pelvic fracture (HCC) 05/27/2024   Type I or II open fracture of left ankle 05/27/2024   Type 2 diabetes mellitus without complication, without long-term current use of insulin  (HCC) 01/12/2023   Right hip pain 10/19/2022   Status post cesarean delivery 02/25/2014   Fetal macrosomia in pregnancy in third trimester 02/20/2014   Hypertension in pregnancy, pre-existing, antepartum 10/14/2013   Diabetes mellitus (Class B), antepartum 09/10/2013   Hx of preeclampsia, prior pregnancy, currently pregnant 08/21/2013   Previous cesarean section complicating pregnancy 08/21/2013   Hypertension 08/21/2013   Obesity complicating pregnancy in first trimester 08/21/2013   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   Past Medical History:  Past Medical History:  Diagnosis Date   Anemia    Diabetes mellitus without complication (HCC)    GM - Diet controlled - no meds   Hypertension    PIH with pregnancy   Sleep apnea    does not use CPAP - lost weight   SVD (spontaneous vaginal delivery)    x 2   Past Surgical History:  Past Surgical History:  Procedure Laterality Date   CESAREAN SECTION  03/13/2010   CESAREAN SECTION WITH BILATERAL TUBAL LIGATION N/A 02/25/2014   Procedure: CESAREAN SECTION WITH BILATERAL TUBAL LIGATION;  Surgeon: Lang JINNY Peel, DO;  Location: [facility];  Service: Obstetrics;  Laterality: N/A;   EXTERNAL FIXATION LEG Left 05/27/2024   Procedure: EXTERNAL FIXATION, LOWER EXTREMITY;  Surgeon: Georgina Ozell LABOR, MD;  Location: [facility];  Service: Orthopedics;  Laterality: Left;   EXTERNAL FIXATION, ANKLE  Left 05/29/2024   Procedure: ADJUSTMENT EXTERNAL FIXATION, ANKLE;  Surgeon: Kendal Franky SQUIBB, MD;  Location: [facility];  Service: Orthopedics;  Laterality: Left;   HIP CLOSED REDUCTION Left 05/27/2024   Procedure: CLOSED REDUCTION, HIP AND APPLICATION TRACTION WITH PIN PLACEMENT;  Surgeon: Georgina Ozell LABOR, MD;  Location: [facility];  Service: Orthopedics;  Laterality: Left;   IRRIGATION AND DEBRIDEMENT POSTERIOR HIP Left 05/27/2024   Procedure: IRRIGATION AND DEBRIDEMENT, OPEN FRACTURE;  Surgeon: Georgina Ozell LABOR, MD;  Location: [facility];  Service: Orthopedics;  Laterality: Left;   OPEN REDUCTION INTERNAL FIXATION ACETABULUM POSTERIOR LATERAL Left 05/29/2024   Procedure: OPEN REDUCTION INTERNAL FIXATION ACETABULUM POSTERIOR LATERAL;  Surgeon: Kendal Franky SQUIBB, MD;  Location: [facility];  Service: Orthopedics;  Laterality: Left;   TOOTH EXTRACTION     HPI:  43 y.o. female admitted 05/27/24 after jumping from 3rd story window (suspected SI attempt vs ETOH abuse vs substance-induced mood disorder). Workup for L rib 3,5-7 fxs, L open tib-fib fx, and posterior hip dislocation with acetabulum fx. S/p L tibial ex fix with wound vac placement, L acetabular ORIF on 12/17. PMH includes chronic R hip pain, anemia, DM, HTN, OSA, remote smoker.   Assessment / Plan / Recommendation Clinical Impression  Pt presents with normal cognition, speech and language.  Demonstrated normal awareness, orientation, verbal problem-solving, and working memory and prospective memory.  No SLP f /u needed. Our service will sgin off.    SLP Assessment  SLP Recommendation/Assessment: Patient does not need any further Speech Language Pathology Services SLP Visit Diagnosis: Cognitive communication deficit (R41.841) ruled-out  SLP Evaluation Cognition  Overall Cognitive Status: Within Functional Limits for tasks assessed Arousal/Alertness: Awake/alert Orientation Level: Oriented X4 Attention: Alternating Alternating  Attention: Appears intact Memory: Appears intact Awareness: Appears intact Problem Solving: Appears intact Safety/Judgment: Appears intact       Comprehension  Auditory Comprehension Overall Auditory Comprehension: Appears within functional limits for tasks assessed    Expression Expression Primary Mode of Expression: Verbal Verbal Expression Overall Verbal Expression: Appears within functional limits for tasks assessed   Oral / Motor  Oral Motor/Sensory Function Overall Oral Motor/Sensory Function: Within functional limits Motor Speech Overall Motor Speech: Appears within functional limits for tasks assessed            Vona Palma Laurice 05/30/2024, 12:21 PM Ferrah Panagopoulos L. Vona, MA CCC/SLP Clinical Specialist - Acute Care SLP Acute Rehabilitation Services Office number [phone]
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
## 2024-05-31 ENCOUNTER — Inpatient Hospital Stay (HOSPITAL_COMMUNITY)

## 2024-05-31 ENCOUNTER — Inpatient Hospital Stay (HOSPITAL_COMMUNITY): Admitting: Anesthesiology

## 2024-05-31 ENCOUNTER — Encounter (HOSPITAL_COMMUNITY): Admission: EM | Disposition: A | Discharge status: Rehabilitation Facility | Payer: Self-pay | Source: Home / Self Care

## 2024-05-31 ENCOUNTER — Encounter (HOSPITAL_COMMUNITY): Payer: Self-pay

## 2024-05-31 DIAGNOSIS — I10 Essential (primary) hypertension: Secondary | ICD-10-CM | POA: Diagnosis not present

## 2024-05-31 DIAGNOSIS — S82392C Other fracture of lower end of left tibia, initial encounter for open fracture type IIIA, IIIB, or IIIC: Secondary | ICD-10-CM

## 2024-05-31 DIAGNOSIS — Z87891 Personal history of nicotine dependence: Secondary | ICD-10-CM | POA: Diagnosis not present

## 2024-05-31 DIAGNOSIS — E119 Type 2 diabetes mellitus without complications: Secondary | ICD-10-CM | POA: Diagnosis not present

## 2024-05-31 HISTORY — PX: OPEN REDUCTION INTERNAL FIXATION (ORIF) TIBIA/FIBULA FRACTURE: SHX5992

## 2024-05-31 LAB — CBC
HCT: 26.1 % — ABNORMAL LOW (ref 36.0–46.0)
Hemoglobin: 8.5 g/dL — ABNORMAL LOW (ref 12.0–15.0)
MCH: 26.9 pg (ref 26.0–34.0)
MCHC: 32.6 g/dL (ref 30.0–36.0)
MCV: 82.6 fL (ref 80.0–100.0)
Platelets: 253 K/uL (ref 150–400)
RBC: 3.16 MIL/uL — ABNORMAL LOW (ref 3.87–5.11)
RDW: 13.5 % (ref 11.5–15.5)
WBC: 11.7 K/uL — ABNORMAL HIGH (ref 4.0–10.5)
nRBC: 0 % (ref 0.0–0.2)

## 2024-05-31 LAB — TYPE AND SCREEN
ABO/RH(D): A POS
ABO/RH(D): A POS
Antibody Screen: NEGATIVE
Antibody Screen: NEGATIVE
Unit division: 0

## 2024-05-31 LAB — GLUCOSE, CAPILLARY
Glucose-Capillary: 160 mg/dL — ABNORMAL HIGH (ref 70–99)
Glucose-Capillary: 139 mg/dL — ABNORMAL HIGH (ref 70–99)
Glucose-Capillary: 176 mg/dL — ABNORMAL HIGH (ref 70–99)
Glucose-Capillary: 239 mg/dL — ABNORMAL HIGH (ref 70–99)
Glucose-Capillary: 222 mg/dL — ABNORMAL HIGH (ref 70–99)

## 2024-05-31 LAB — BPAM RBC
Blood Product Expiration Date: 202601142359
ISSUE DATE / TIME: 202512181636
Unit Type and Rh: 6200

## 2024-05-31 MED ORDER — OXYCODONE HCL 5 MG/5ML PO SOLN: 5.0000 mg | Freq: Once | ORAL | Status: DC | PRN

## 2024-05-31 MED ORDER — STERILE WATER FOR IRRIGATION IR SOLN
Status: DC | PRN
  Administered 2024-05-31: 1000 mL

## 2024-05-31 MED ORDER — PROPOFOL 10 MG/ML IV BOLUS
INTRAVENOUS | Status: AC
  Filled 2024-05-31: qty 20

## 2024-05-31 MED ORDER — 0.9 % SODIUM CHLORIDE (POUR BTL) OPTIME
TOPICAL | Status: DC | PRN
  Administered 2024-05-31: 1000 mL

## 2024-05-31 MED ORDER — ORAL CARE MOUTH RINSE: 15.0000 mL | Freq: Once | OROMUCOSAL | Status: AC

## 2024-05-31 MED ORDER — LIDOCAINE 2% (20 MG/ML) 5 ML SYRINGE
INTRAMUSCULAR | Status: AC
  Filled 2024-05-31: qty 5

## 2024-05-31 MED ORDER — OXYCODONE HCL 5 MG PO TABS: 5.0000 mg | ORAL_TABLET | Freq: Once | ORAL | Status: DC | PRN

## 2024-05-31 MED ORDER — CEFAZOLIN SODIUM-DEXTROSE 2-3 GM-%(50ML) IV SOLR
INTRAVENOUS | Status: DC | PRN
  Administered 2024-05-31: 2 g via INTRAVENOUS

## 2024-05-31 MED ORDER — LACTATED RINGERS IV SOLN: INTRAVENOUS | Status: DC

## 2024-05-31 MED ORDER — PHENYLEPHRINE 80 MCG/ML (10ML) SYRINGE FOR IV PUSH (FOR BLOOD PRESSURE SUPPORT)
PREFILLED_SYRINGE | INTRAVENOUS | Status: DC | PRN
  Administered 2024-05-31 (×2): 160 ug via INTRAVENOUS
  Administered 2024-05-31: 80 ug via INTRAVENOUS

## 2024-05-31 MED ORDER — ONDANSETRON HCL 4 MG/2ML IJ SOLN
INTRAMUSCULAR | Status: AC
  Filled 2024-05-31: qty 2

## 2024-05-31 MED ORDER — ACETAMINOPHEN 10 MG/ML IV SOLN: 1000.0000 mg | Freq: Once | INTRAVENOUS | Status: DC | PRN

## 2024-05-31 MED ORDER — LIDOCAINE 2% (20 MG/ML) 5 ML SYRINGE
INTRAMUSCULAR | Status: DC | PRN
  Administered 2024-05-31: 100 mg via INTRAVENOUS

## 2024-05-31 MED ORDER — METOCLOPRAMIDE HCL 5 MG/ML IJ SOLN: 5.0000 mg | Freq: Three times a day (TID) | INTRAMUSCULAR | Status: DC | PRN

## 2024-05-31 MED ORDER — CHLORHEXIDINE GLUCONATE 0.12 % MT SOLN: 15.0000 mL | Freq: Once | OROMUCOSAL | Status: AC

## 2024-05-31 MED ORDER — CEFAZOLIN SODIUM-DEXTROSE 2-4 GM/100ML-% IV SOLN
2.0000 g | Freq: Three times a day (TID) | INTRAVENOUS | Status: AC
  Administered 2024-05-31 – 2024-06-01 (×3): 2 g via INTRAVENOUS
  Filled 2024-05-31 (×3): qty 100

## 2024-05-31 MED ORDER — ONDANSETRON HCL 4 MG/2ML IJ SOLN
INTRAMUSCULAR | Status: DC | PRN
  Administered 2024-05-31: 4 mg via INTRAVENOUS

## 2024-05-31 MED ORDER — POLYETHYLENE GLYCOL 3350 17 G PO PACK: 17.0000 g | PACK | Freq: Every day | ORAL | Status: DC | PRN

## 2024-05-31 MED ORDER — FENTANYL CITRATE (PF) 100 MCG/2ML IJ SOLN
INTRAMUSCULAR | Status: AC
  Filled 2024-05-31: qty 2

## 2024-05-31 MED ORDER — METOCLOPRAMIDE HCL 5 MG PO TABS: 5.0000 mg | ORAL_TABLET | Freq: Three times a day (TID) | ORAL | Status: DC | PRN

## 2024-05-31 MED ORDER — VANCOMYCIN HCL 1000 MG IV SOLR
INTRAVENOUS | Status: AC
  Filled 2024-05-31: qty 20

## 2024-05-31 MED ORDER — DROPERIDOL 2.5 MG/ML IJ SOLN: 0.6250 mg | Freq: Once | INTRAMUSCULAR | Status: DC | PRN

## 2024-05-31 MED ORDER — VANCOMYCIN HCL 1000 MG IV SOLR
INTRAVENOUS | Status: DC | PRN
  Administered 2024-05-31: 1000 mg via TOPICAL

## 2024-05-31 MED ORDER — CHLORHEXIDINE GLUCONATE 0.12 % MT SOLN
OROMUCOSAL | Status: AC
  Administered 2024-05-31: 15 mL via OROMUCOSAL
  Filled 2024-05-31: qty 15

## 2024-05-31 MED ORDER — FENTANYL CITRATE (PF) 250 MCG/5ML IJ SOLN
INTRAMUSCULAR | Status: AC
  Filled 2024-05-31: qty 5

## 2024-05-31 MED ORDER — BUPIVACAINE HCL (PF) 0.25 % IJ SOLN
INTRAMUSCULAR | Status: DC | PRN
  Administered 2024-05-31: 20 mL via PERINEURAL

## 2024-05-31 MED ORDER — FENTANYL CITRATE (PF) 100 MCG/2ML IJ SOLN
25.0000 ug | INTRAMUSCULAR | Status: DC | PRN
  Administered 2024-05-31: 50 ug via INTRAVENOUS

## 2024-05-31 MED ORDER — CEFAZOLIN SODIUM-DEXTROSE 2-4 GM/100ML-% IV SOLN
INTRAVENOUS | Status: AC
  Filled 2024-05-31: qty 100

## 2024-05-31 MED ORDER — DEXAMETHASONE SOD PHOSPHATE PF 10 MG/ML IJ SOLN
INTRAMUSCULAR | Status: DC | PRN
  Administered 2024-05-31: 10 mg via INTRAVENOUS

## 2024-05-31 MED ORDER — MIDAZOLAM HCL (PF) 2 MG/2ML IJ SOLN: 2.0000 mg | Freq: Once | INTRAMUSCULAR | Status: AC

## 2024-05-31 MED ORDER — FENTANYL CITRATE (PF) 250 MCG/5ML IJ SOLN
INTRAMUSCULAR | Status: DC | PRN
  Administered 2024-05-31 (×3): 50 ug via INTRAVENOUS

## 2024-05-31 MED ORDER — MIDAZOLAM HCL 2 MG/2ML IJ SOLN
INTRAMUSCULAR | Status: AC
  Administered 2024-05-31: 2 mg via INTRAVENOUS
  Filled 2024-05-31: qty 2

## 2024-05-31 MED ORDER — PROPOFOL 10 MG/ML IV BOLUS
INTRAVENOUS | Status: DC | PRN
  Administered 2024-05-31: 200 mg via INTRAVENOUS

## 2024-05-31 MED ORDER — SODIUM CHLORIDE 0.9 % IV SOLN: INTRAVENOUS | Status: DC

## 2024-05-31 SURGICAL SUPPLY — 3 items
LEFT 12 HOLE STD ANTEROLATERAL TIBIA PLATE IMPLANT
NDL HYPO 21X1.5 SAFETY (NEEDLE) IMPLANT
NDL HYPO 25GX1X1/2 BEV (NEEDLE) ×1 IMPLANT

## 2024-05-31 NOTE — Anesthesia Procedure Notes (Signed)
 Procedure Name: LMA Insertion Date/Time: 05/31/2024 11:28 AM  Performed by: Marva Lonni PARAS, CRNAPre-anesthesia Checklist: Patient identified, Emergency Drugs available, Suction available and Patient being monitored Patient Re-evaluated:Patient Re-evaluated prior to induction Oxygen Delivery Method: Circle System Utilized Preoxygenation: Pre-oxygenation with 100% oxygen Induction Type: IV induction Ventilation: Mask ventilation without difficulty LMA: LMA inserted LMA Size: 4.0 Number of attempts: 1 Airway Equipment and Method: Bite block Placement Confirmation: positive ETCO2 and breath sounds checked- equal and bilateral Tube secured with: Tape Dental Injury: Teeth and Oropharynx as per pre-operative assessment
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
## 2024-05-31 NOTE — Progress Notes (Signed)
 "  Progress Note  2 Days Post-Op  Subjective: Family at bedside.  Seen with RN and NT.  Patient with left rib pain when coughing and with movement. Productive cough with yellow sputum. No sob. Pain in LLE that is well controlled. Has not required PRN IV pain medication in the last 24 hours. Reporting sharp R great toe pain that is worse with pressure and movement. No other complaints. NPO currently for OR. Tolerated diet yesterday without n/v. BM yesterday. Foley in place. No I/O for UOP over the last 24 hours - RN to update documentation. Up to EOB with therapies yesterday.   Afebrile. Tachycardia resolved. No hypotension. On RA.  S/p 1U PRBC 12/18. Hgb 7.8 --> 8.5. plt 253 WBC stable at 11.7.   Objective: Vital signs in last 24 hours: Temp:  [98.4 F (36.9 C)-99.2 F (37.3 C)] 98.4 F (36.9 C) (12/19 0557) Pulse Rate:  [96-112] 96 (12/19 0557) Resp:  [18-20] 18 (12/19 0557) BP: (104-141)/(55-83) 136/83 (12/19 0557) SpO2:  [93 %-100 %] 93 % (12/19 0557) Last BM Date : 05/30/24  Intake/Output from previous day: 12/18 0701 - 12/19 0700 In: 512.5 [P.O.:200; Blood:312.5] Out: -  Intake/Output this shift: No intake/output data recorded.  PE: General: pleasant, WD, obese female who is laying in bed in NAD HEENT: head is normocephalic, atraumatic.  Sclera are noninjected.  Pupils equal and round.   Heart: regular, rate, and rhythm. Palpable radial bilaterally and R DP. Lungs: CTA b/l. No wheezes, rhonchi, or rales noted.  Respiratory effort nonlabored Abd: soft, NT, ND MS: Ex-fix present to LLE without significant drainage from pin sites; L toes WWP and NVI. R great toe with abrasion on inferior aspect that is hemostatic. Point ttp of the R great toe. No other ttp of the R foot. Psych: A&Ox3 with an appropriate affect.   Lab Results:  Recent Labs    05/30/24 0210 05/31/24 0301  WBC 11.7* 11.7*  HGB 7.8* 8.5*  HCT 23.7* 26.1*  PLT 248 253   BMET Recent Labs     05/29/24 0833 05/30/24 0601  NA 135 137  K 3.5 3.5  CL 101 102  CO2 22 27  GLUCOSE 226* 155*  BUN 6 <5*  CREATININE 0.57 0.56  CALCIUM  9.1 8.8*   PT/INR No results for input(s): LABPROT, INR in the last 72 hours.  CMP     Component Value Date/Time   NA 137 05/30/2024 0601   NA 135 01/12/2023 1038   K 3.5 05/30/2024 0601   CL 102 05/30/2024 0601   CO2 27 05/30/2024 0601   GLUCOSE 155 (H) 05/30/2024 0601   GLUCOSE 107 (H) 09/09/2013 1237   BUN <5 (L) 05/30/2024 0601   BUN 9 01/12/2023 1038   CREATININE 0.56 05/30/2024 0601   CREATININE 0.62 02/03/2014 0931   CALCIUM  8.8 (L) 05/30/2024 0601   PROT 6.2 (L) 05/30/2024 0601   PROT 7.1 01/12/2023 1038   ALBUMIN  3.7 05/30/2024 0601   ALBUMIN  4.4 01/12/2023 1038   AST 65 (H) 05/30/2024 0601   ALT 30 05/30/2024 0601   ALKPHOS 53 05/30/2024 0601   BILITOT 0.5 05/30/2024 0601   BILITOT 0.3 01/12/2023 1038   GFRNONAA >60 05/30/2024 0601   GFRAA >60 07/02/2019 1311   Lipase     Component Value Date/Time   LIPASE 15 10/17/2008 0950       Studies/Results: DG Pelvis Comp Min 3V Result Date: 05/29/2024 EXAM: 3 VIEW(S) XRAY OF THE PELVIS 05/29/2024 04:36:00 PM COMPARISON: Comparison  with 05/28/2024. CLINICAL HISTORY: 243000 Acetabular fracture (HCC) 243000 Acetabular fracture (HCC) FINDINGS: BONES AND JOINTS: Interval postoperative changes with realignment of the left acetabulum and hip with plate and screw fixation of the acetabulum. Near-anatomic alignment is suggested. Surgical hardware appears intact. SI joints and symphysis pubis are not displaced. SOFT TISSUES: Soft tissue gas is present, likely resulting from recent surgery. IMPRESSION: 1. Interval postoperative changes with realignment of the left acetabulum and hip with plate and screw fixation of the acetabulum, with near-anatomic alignment suggested and intact hardware. 2. Soft tissue gas, likely resulting from recent surgery. Electronically signed by: Elsie Gravely MD 05/29/2024 05:23 PM EST RP Workstation: [id number]   DG Ankle Complete Left Result Date: 05/29/2024 EXAM: 3 OR MORE VIEW(S) XRAY OF THE LEFT ANKLE 05/29/2024 04:36:00 PM CLINICAL HISTORY: 243000 Acetabular fracture (HCC) 243000 Acetabular fracture (HCC) COMPARISON: Comparison with 05/28/2024. FINDINGS: BONES AND JOINTS: Postoperative changes with external fixator through the calcaneus. Multiple comminuted fractures were demonstrated in the visualized distal left tibia centered on the tibial metaphysis but with fracture lines extending to the articular surface. There is superior overriding and lateral displacement of distal fracture fragments. There is mild posterior displacement of distal fracture fragments. Transverse fractures of the distal fibula with greater than 1 shaft width lateral displacement and about 2 cm lateral overriding of the distal fracture fragment. SOFT TISSUES: Diffuse soft tissue swelling. IMPRESSION: 1. Multiple comminuted fractures of the distal left tibia with superior overriding, lateral displacement, and mild posterior displacement of distal fracture fragments. 2. Transverse fractures of the distal fibula with greater than 1 shaft width lateral displacement and about 2 cm lateral overriding of the distal fracture fragment. 3. Postoperative changes with external fixator through the calcaneus. 4. Diffuse soft tissue swelling. Electronically signed by: Elsie Gravely MD 05/29/2024 05:20 PM EST RP Workstation: [id number]   DG Pelvis Comp Min 3V Result Date: 05/29/2024 EXAM: 5 INTRAOPERATIVE FLUOROSCOPIC VIEWS OF THE LEFT HIP 05/29/2024 01:10:34 PM COMPARISON: None available. CLINICAL HISTORY: 461500 Elective surgery 461500 Elective surgery 461500 FINDINGS: BONES AND JOINTS: Fixation plates are seen along the acetabulum. The alignment is grossly anatomic. SOFT TISSUES: The soft tissues are unremarkable. TECHNICAL DETAILS: Fluoroscopy time 47.3 seconds. Fluoroscopy dose 4.95  microgray. IMPRESSION: 1. Intraoperative fluoroscopic views of the left hip demonstrate fixation plates along the acetabulum with grossly anatomic alignment. Electronically signed by: Greig Pique MD 05/29/2024 05:18 PM EST RP Workstation: [id number]   DG Ankle Complete Left Result Date: 05/29/2024 EXAM: 3 OR MORE VIEW(S) XRAY OF THE LEFT ANKLE 05/29/2024 01:10:34 PM CLINICAL HISTORY: 461500 Elective surgery 461500 Elective surgery 461500 COMPARISON: Comparison with 05/28/2024. FINDINGS: FLUOROSCOPY: Intraoperative fluoroscopy was utilized for surgical control purposes. Fluoroscopy time: 47.3 seconds. Dose: 4.95 mGy. 2 spot fluoroscopic images obtained. Fluoroscopic images are demonstrating internal/external fixation of comminuted fractures of the distal tibia and fibula. IMPRESSION: 1. Intraoperative fluoroscopy for surgical control purposes, demonstrating internal/external fixation of comminuted fractures of the distal tibia and fibula. Electronically signed by: Elsie Gravely MD 05/29/2024 05:18 PM EST RP Workstation: [id number]   DG C-Arm 1-60 Min-No Report Result Date: 05/29/2024 Fluoroscopy was utilized by the requesting physician.  No radiographic interpretation.   DG C-Arm 1-60 Min-No Report Result Date: 05/29/2024 Fluoroscopy was utilized by the requesting physician.  No radiographic interpretation.   DG C-Arm 1-60 Min-No Report Result Date: 05/29/2024 Fluoroscopy was utilized by the requesting physician.  No radiographic interpretation.    Anti-infectives: Anti-infectives (From admission, onward)    Start     Dose/Rate Route  Frequency Ordered Stop   05/29/24 2200  ceFAZolin  (ANCEF ) IVPB 2g/100 mL premix        2 g 200 mL/hr over 30 Minutes Intravenous Every 8 hours 05/29/24 1818 05/30/24 1529   05/29/24 1116  vancomycin  (VANCOCIN ) powder  Status:  Discontinued          As needed 05/29/24 1117 05/29/24 1314   05/28/24 0600  cefTRIAXone  (ROCEPHIN ) 1 g in sodium chloride  0.9 % 100  mL IVPB        1 g 200 mL/hr over 30 Minutes Intravenous Every 24 hours 05/28/24 0450 05/29/24 0650   05/27/24 2256  vancomycin  (VANCOCIN ) powder  Status:  Discontinued          As needed 05/27/24 2257 05/28/24 0017   05/27/24 1915  ceFAZolin  (ANCEF ) IVPB 2g/100 mL premix        2 g 200 mL/hr over 30 Minutes Intravenous STAT 05/27/24 1911 05/27/24 2000        Assessment/Plan Jump out of window   Suspected suicide attempt - psych consulted and following, recommend 1:1 observation, inpatient recommended currently  L rib fx 3/5-7 - pain control, pulm toilet  L open tib-fib fx - per Dr. Kendal, s/p ex-fix 12/16, closed reduction and washout with VAC placement 12/17. Ortho planning OR today pending OR availability and soft tissue swelling. NWB LLE. Posterior L hip dislocation with associated acetabulum fx - per Dr. Kendal, s/p ORIF 12/17 ABL anemia - S/p 1U PRBC 12/18. Hgb 8.5 this AM. Recheck in AM R toe pain - xray  FEN - NPO for OR. Okay for reg diet after. Continue bowel regimen DVT - SCDs, LMWH ID - ancef /rocephin  for open fx per ortho Foley - Leave for now given plans for OR. TOV likely 12/20 after OR complete, strict I/O Dispo - 6N, PT/OT. Tentatively planned for OR with Ortho today. Psych rec inpatient psych. TOC checking if candidate for [facility] or [facility] with current mobility, otherwise may need central.    LOS: 4 days   I reviewed Consultant psychiatry, orthopedic surgery notes, last 24 h vitals and pain scores, last 48 h intake and output, last 24 h labs and trends, and last 24 h imaging results.  This care required moderate level of medical decision making.    Ozell CHRISTELLA Shaper, [facility] 05/31/2024, 7:43 AM Please see Amion for pager number during day hours 7:00am-4:30pm  "

## 2024-05-31 NOTE — Plan of Care (Signed)
" °  Problem: Coping: Goal: Ability to adjust to condition or change in health will improve Outcome: Progressing   Problem: Nutritional: Goal: Maintenance of adequate nutrition will improve Outcome: Progressing   Problem: Nutrition: Goal: Adequate nutrition will be maintained Outcome: Progressing   Problem: Activity: Goal: Risk for activity intolerance will decrease Outcome: Not Progressing   "

## 2024-05-31 NOTE — Transfer of Care (Signed)
 Immediate Anesthesia Transfer of Care Note  Patient: Caitlyn [name]  Procedure(s) Performed: OPEN REDUCTION INTERNAL FIXATION (ORIF) OF LEFT DISTAL TIBIA/FIBULA FRACTURE (Left: Leg Lower)  Patient Location: PACU  Anesthesia Type:General  Level of Consciousness: awake, alert , oriented, and patient cooperative  Airway & Oxygen Therapy: Patient Spontanous Breathing  Post-op Assessment: Report given to RN and Post -op Vital signs reviewed and stable  Post vital signs: Reviewed and stable  Last Vitals:  Vitals Value Taken Time  BP 148/88 05/31/24 13:57  Temp 37 C 05/31/24 13:57  Pulse 99 05/31/24 13:58  Resp 21 05/31/24 13:58  SpO2 93 % 05/31/24 13:58  Vitals shown include unfiled device data.  Last Pain:  Vitals:   05/31/24 1029  TempSrc: Oral  PainSc:       Patients Stated Pain Goal: 0 (05/30/24 2153)  Complications: No notable events documented.

## 2024-05-31 NOTE — Anesthesia Postprocedure Evaluation (Signed)
"   Anesthesia Post Note  Patient: Caitlyn [name]  Procedure(s) Performed: OPEN REDUCTION INTERNAL FIXATION (ORIF) OF LEFT DISTAL TIBIA/FIBULA FRACTURE (Left: Leg Lower)     Patient location during evaluation: PACU Anesthesia Type: General Level of consciousness: awake and alert Pain management: pain level controlled Vital Signs Assessment: post-procedure vital signs reviewed and stable Respiratory status: spontaneous breathing, nonlabored ventilation, respiratory function stable and patient connected to nasal cannula oxygen Cardiovascular status: blood pressure returned to baseline and stable Postop Assessment: no apparent nausea or vomiting Anesthetic complications: no   No notable events documented.  Last Vitals:  Vitals:   05/31/24 1400 05/31/24 1415  BP: (!) 145/92 (!) 156/95  Pulse: 97 (!) 102  Resp: 18 18  Temp:    SpO2: 91% 91%    Last Pain:  Vitals:   05/31/24 1029  TempSrc: Oral  PainSc:                  Rome Ade      "

## 2024-05-31 NOTE — Discharge Instructions (Signed)
 "  Franky Light, MD Lauraine Moores PA-C [facility] [address] [phone] (tel)   [phone] (fax)                                  POST-OPERATIVE INSTRUCTIONS     WEIGHT BEARING STATUS: Non-weightbearing left leg  RANGE OF MOTION/ACTIVITY:  ok for hip and knee motion. Keep splint in place  WOUND CARE Please keep splint clean dry and intact until follow-up. If your splint gets wet for any reason please contact the office immediately.  Do not stick anything down your splint such as pencils, momey, hangers to try and scratch yourself.  If you feel itchy take Benadryl  as prescribed on the bottle for itching You may shower on Post-Op Day #2.  You must keep splint dry during this process and may find that a plastic bag taped around the extremity or alternatively a towel based bath may be a better option.   If you get your splint wet or if it is damaged please contact our clinic.  EXERCISES Due to your splint being in place you will not be able to bear weight through your extremity.   DO NOT PUT ANY WEIGHT ON YOUR OPERATIVE LEG Please use crutches or a walker to avoid weight bearing.   DVT/PE prophylaxis: Eliquis 2.5 mg twice daily x 30 days  DIET: As you were eating previously.  Can use over the counter stool softeners and bowel preparations, such as Miralax , to help with bowel movements.  Narcotics can be constipating.  Be sure to drink plenty of fluids  REGIONAL ANESTHESIA (NERVE BLOCKS) The anesthesia team may have performed a nerve block for you if safe in the setting of your care.  This is a great tool used to minimize pain.  Typically the block may start wearing off overnight but the long acting medicine may last for 3-4 days.  The nerve block wearing off can be a challenging period but please utilize your as needed pain medications to try and manage this period.    POST-OP MEDICATIONS- Multimodal approach to pain control  In general your pain will  be controlled with a combination of substances.  Prescriptions unless otherwise discussed are electronically sent to your pharmacy.  This is a carefully made plan we use to minimize narcotic use.     - Acetaminophen  - Non-narcotic pain medicine taken on a scheduled basis   - Oxycodone  - This is a strong narcotic, to be used only on an as needed basis for pain.  -  Eliquis - This medicine is used to minimize the risk of blood clots after surgery.  FOLLOW-UP If you develop a Fever (>101.5), Redness or Drainage from the surgical incision site, please call our office to arrange for an evaluation. Please call the office to schedule a follow-up appointment for your incision check if you do not already have one, 7-10 days post-operatively.   VISIT OUR WEBSITE FOR ADDITIONAL INFORMATION: [email or web address]   HELPFUL INFORMATION  If you had a block, it will wear off between 8-24 hrs postop typically.  This is period when your pain may go from nearly zero to the pain you would have had postop without the block.  This is an abrupt transition but nothing dangerous is happening.  You may take an extra dose of narcotic when this happens.  You should wean off your narcotic medicines as soon as you are  able.  Most patients will be off or using minimal narcotics before their first postop appointment.   We suggest you use the pain medication the first night prior to going to bed, in order to ease any pain when the anesthesia wears off. You should avoid taking pain medications on an empty stomach as it will make you nauseous.  Do not drink alcoholic beverages or take illicit drugs when taking pain medications.  In most states it is against the law to drive while you are in a splint or sling.  And certainly against the law to drive while taking narcotics.  You may return to work/school in the next couple of days when you feel up to it.   Pain medication may make you constipated.  Below are a few solutions  to try in this order: Decrease the amount of pain medication if you arent having pain. Drink lots of decaffeinated fluids. Drink prune juice and/or each dried prunes  If the first 3 dont work start with additional solutions Take Colace - an over-the-counter stool softener Take Senokot - an over-the-counter laxative Take Miralax  - a stronger over-the-counter laxative  "

## 2024-05-31 NOTE — Interval H&P Note (Signed)
 History and Physical Interval Note:  05/31/2024 10:41 AM  Caitlyn [name]  has presented today for surgery, with the diagnosis of Left pilon fractur.  The various methods of treatment have been discussed with the patient and family. After consideration of risks, benefits and other options for treatment, the patient has consented to  Procedures: OPEN REDUCTION INTERNAL FIXATION (ORIF) PILON FRACTURE (Left) as a surgical intervention.  The patient's history has been reviewed, patient examined, no change in status, stable for surgery.  I have reviewed the patient's chart and labs.  Questions were answered to the patient's satisfaction.     Valorie Mcgrory P Terrica Duecker

## 2024-05-31 NOTE — Progress Notes (Signed)
 PT Cancellation Note  Patient Details Name: Caitlyn [name] MRN: [id number] DOB: [birth year]   Cancelled Treatment:    Reason Eval/Treat Not Completed: Patient at procedure or test/unavailable (Pt in surgery currently.  Will re-evaluate tomorrow. Thanks.)   Stephane JULIANNA Bevel 05/31/2024, 10:16 AM Weston Kallman M,PT Acute Rehab Services [phone]

## 2024-05-31 NOTE — Anesthesia Preprocedure Evaluation (Signed)
"                                    Anesthesia Evaluation  Patient identified by MRN, date of birth, ID band Patient awake    Reviewed: Allergy & Precautions, NPO status , Patient's Chart, lab work & pertinent test results  History of Anesthesia Complications Negative for: history of anesthetic complications  Airway Mallampati: III  TM Distance: >3 FB Neck ROM: Full    Dental  (+) Dental Advisory Given, Teeth Intact   Pulmonary neg shortness of breath, sleep apnea , neg COPD, neg recent URI, Patient abstained from smoking.Not current smoker, former smoker   breath sounds clear to auscultation       Cardiovascular Exercise Tolerance: Good METShypertension, Pt. on medications (-) CAD and (-) Past MI (-) dysrhythmias  Rhythm:Regular     Neuro/Psych negative neurological ROS  negative psych ROS   GI/Hepatic ,neg GERD  ,,(+)     (-) substance abuse    Endo/Other  diabetes    Renal/GU negative Renal ROS     Musculoskeletal   Abdominal  (+) + obese  Peds  Hematology  (+) Blood dyscrasia, anemia   Anesthesia Other Findings Past Medical History: No date: Anemia No date: Diabetes mellitus without complication (HCC)     Comment:  GM - Diet controlled - no meds No date: Hypertension     Comment:  PIH with pregnancy No date: Sleep apnea     Comment:  does not use CPAP - lost weight No date: SVD (spontaneous vaginal delivery)     Comment:  x 2  Reproductive/Obstetrics                              Anesthesia Physical Anesthesia Plan  ASA: 3  Anesthesia Plan: General   Post-op Pain Management: Regional block*, Tylenol  PO (pre-op)* and Gabapentin  PO (pre-op)*   Induction: Intravenous  PONV Risk Score and Plan: 4 or greater and Ondansetron , Dexamethasone  and Midazolam   Airway Management Planned: LMA  Additional Equipment: None  Intra-op Plan:   Post-operative Plan: Extubation in OR  Informed Consent: I have reviewed  the patients History and Physical, chart, labs and discussed the procedure including the risks, benefits and alternatives for the proposed anesthesia with the patient or authorized representative who has indicated his/her understanding and acceptance.     Dental advisory given  Plan Discussed with: CRNA  Anesthesia Plan Comments: (Discussed risks of anesthesia with patient, including PONV, sore throat, lip/dental/eye damage. Rare risks discussed as well, such as cardiorespiratory and neurological sequelae, and allergic reactions. Discussed the role of CRNA in patient's perioperative care. Patient understands. Discussed r/b/a of adductor canal nerve block, including:  - bleeding, infection, nerve damage - poor or non functioning block. - reactions and toxicity to local anesthetic Patient understands. Patient unable to to flex knee or roll to side, precluding my ability to safely perform popliteal block.)         Anesthesia Quick Evaluation  "

## 2024-05-31 NOTE — Anesthesia Procedure Notes (Signed)
 Anesthesia Regional Block: Adductor canal block   Pre-Anesthetic Checklist: , timeout performed,  Correct Patient, Correct Site, Correct Laterality,  Correct Procedure, Correct Position, site marked,  Risks and benefits discussed,  Surgical consent,  Pre-op evaluation,  At surgeon's request and post-op pain management  Laterality: Lower and Left  Prep: chloraprep       Needles:  Injection technique: Single-shot  Needle Type: Echogenic Needle     Needle Length: 9cm  Needle Gauge: 21     Additional Needles:   Procedures:,,,, ultrasound used (permanent image in chart),,    Narrative:  Start time: 05/31/2024 10:42 AM End time: 05/31/2024 10:45 AM Injection made incrementally with aspirations every 5 mL.  Performed by: Personally  Anesthesiologist: Boone Fess, MD  Additional Notes: Patient's chart reviewed and they were deemed appropriate candidate for procedure, per surgeon's request. Patient educated about risks, benefits, and alternatives of the block including but not limited to: temporary or permanent nerve damage, bleeding, infection, damage to surround tissues, block failure, local anesthetic toxicity. Patient expressed understanding. A formal time-out was conducted consistent with institution rules.  Monitors were applied, and minimal sedation used (see nursing record). The site was prepped with skin prep and allowed to dry, and sterile gloves were used. A high frequency linear ultrasound probe with probe cover was utilized throughout. Femoral artery visualized at mid-thigh level, local anesthetic injected anterolateral to it, and echogenic block needle trajectory was monitored throughout. Hydrodissection of saphenous nerve visualized and appeared anatomically normal. Aspiration performed every 5ml. Blood vessels were avoided. All injections were performed without resistance and free of blood and paresthesias. The patient tolerated the procedure well.  Injectate: 20ml 0.25%  bupivacaine 

## 2024-05-31 NOTE — Op Note (Signed)
 Orthopaedic Surgery Operative Note (CSN: [id number] ) Date of Surgery: 05/31/2024  Admit Date: 05/27/2024   Diagnoses: Pre-Op Diagnoses: Left type IIIA open distal tibia/fibula fracture   Post-Op Diagnosis: Same  Procedures: CPT 27828-Open reduction internal fixation of left distal tibia and fibula fracture CPT 27766-Open reduction internal fixation of left medial malleolus fracture CPT 20694-Removal of external fixation of left ankle  Surgeons : Primary: Kendal Franky SQUIBB, MD  Assistant: Lauraine Moores, PA-C  Location: OR 3   Anesthesia: General with regional pre-operative block   Antibiotics: Ancef  2g preop with 1gm vancomycin  powder placed topically   Tourniquet time: None    Estimated Blood Loss: 50 mL  Complications:* No complications entered in OR log *   Specimens:* No specimens in log *   Implants: Implant Name Type Inv. Item Serial No. Manufacturer Lot No. LRB No. Used Action  PLATE TUBULAR 1/3 4H - [id number] Plate PLATE TUBULAR 1/3 4H  [employer](ORTH,TRAU,BIO,SG)  Left 1 Implanted  SCREW NLOCK 2.7X10 - [id number] Screw SCREW NLOCK 2.7X10  [employer](ORTH,TRAU,BIO,SG)  Left 2 Implanted  SCREW NL 2.7X12 - [id number] Screw SCREW NL 2.7X12  [employer](ORTH,TRAU,BIO,SG)  Left 1 Implanted  SCREW NLOCK 2.7X14 - [id number] Screw SCREW NLOCK 2.7X14  [employer](ORTH,TRAU,BIO,SG)  Left 1 Implanted  SCREW NL 2.7X30 - [id number] Screw SCREW NL 2.7X30  [employer](ORTH,TRAU,BIO,SG)  Left 1 Implanted  SCREW NLOCK 3.5X22 - [id number] Screw SCREW NLOCK 3.5X22  [employer](ORTH,TRAU,BIO,SG)  Left 1 Implanted  LEFT 12 HOLE STD ANTEROLATERAL TIBIA PLATE    [employer](ORTH,TRAU,BIO,SG)  Left 1 Implanted  SCREW NLOCK 3.5X36 - [id number] Screw SCREW NLOCK 3.5X36  [employer](ORTH,TRAU,BIO,SG)  Left 1 Implanted  SCREW NLOCK 3.5X26 - [id number] Screw SCREW NLOCK 3.5X26  [employer](ORTH,TRAU,BIO,SG)  Left 4 Implanted  SCREW LOCK MDS 3.5X36 - [id number] Screw SCREW LOCK MDS  3.5X36  [employer](ORTH,TRAU,BIO,SG)  Left 2 Implanted  PLATE TUB 1/3 8H - [id number] Plate PLATE TUB 1/3 8H  [employer](ORTH,TRAU,BIO,SG)  Left 1 Implanted  SCREW NLOCK ALPS 3.5X18 - [id number] Screw SCREW NLOCK ALPS 3.5X18  [employer](ORTH,TRAU,BIO,SG)  Left 1 Implanted  SCREW NLOCK 3.5X24 - [id number] Screw SCREW NLOCK 3.5X24  [employer](ORTH,TRAU,BIO,SG)  Left 1 Implanted  SCREW LOCK MDS 3.5X14 - [id number] Screw SCREW LOCK MDS 3.5X14  [employer](ORTH,TRAU,BIO,SG)  Left 1 Implanted     Indications for Surgery: 43 year old female who fell from a height sustaining multiple orthopedic injuries including a left open distal tibia/pilon fracture and left distal fibula.  She underwent initial external fixation and I&D.  She returned to a repeat I&D and adjustment of external fixation.  She was indicated for formal open reduction internal fixation of her distal tibia and fibula.  Risks and benefits were discussed with the patient.  Risks included but not limited to bleeding, infection, malunion, nonunion, hardware failure, hardware rotation, nerve and blood vessel injury, DVT, even the possibility anesthetic complications.  She agreed to proceed with surgery and consent was obtained.  Operative Findings: 1.  Open reduction internal fixation of left pilon fracture both tibia and fibula.  The tibia was fixed with a [employer] ALPS MVX anterior lateral distal tibial locking plate.  Fibula fixed with a 4 hole [employer] one third tubular plate 2.  Significant instability through the medial comminuted metaphysis treated with buttress plating using a 8 hole [employer] ALPS MVX one third tubular plate for the medial malleolus. 3.  Removal of external fixation from left lower extremity.  Procedure: The patient was identified  in the preoperative holding area. Consent was confirmed with the patient and their family and all questions were answered. The operative extremity was marked after  confirmation with the patient. she was then brought back to the operating room by our anesthesia colleagues.  She was placed under general anesthetic and carefully transferred over to a radiolucent flattop table.  A bump was placed under her operative hip.  The external fixator was then removed nonsterile knee.  The left lower extremity was then prepped and draped in usual sterile fashion.  A timeout was performed to verify the patient, the procedure, and the extremity.  Preoperative antibiotics were dosed.  Fluoroscopic imaging showed the unstable nature of her injury.  Her fibula fracture was significantly shortened and I felt that restoring this length would allow for appropriate length for the tibia as well.  I made a small posterior lateral incision and carried it down through skin and subcutaneous tissue.  I exposed the fracture site and cleaned out the hematoma.  I was then able to manipulate the fracture back out the length and reduced it anatomically.  I then held provisionally with a 1.4 millimeter K wire.  I then used a 4-hole [employer] ALPS MVX one third tubular plate and positioned this along the lateral cortex of the fibula.  I drilled and placed nonlocking screws proximal distal to the fracture.  This helped maintain some length and alignment and stability to the tibia.  Once I had the fibula fixed I turned my attention to the tibia.  I reopened the traumatic laceration and I was able to manipulate the posterior malleolus fracture back into reduction.  I performed an anterior lateral incision and carried it down through skin and subcutaneous tissue.  I incised through the extensor retinaculum and mobilized the extensor tendons off of the anterior cortex.  I was able to visualize the anterior split of the distal tibia.  I was able to reduce and provisionally hold the medial malleolus fragment to the anterior lateral fragment with a K wire.  I then used a reduction tenaculum and multiple K wires  to hold the posterior malleolus back out the length.  I confirmed adequate alignment and reduction of the joint with fluoroscopy.  I then placed a 2.7 millimeter screw from anterior to posterior to hold the anterior lateral fragment with the posterior malleolus.  I was then able to slide a [employer] anterior lateral distal tibial locking plate submuscularly along the anterior lateral aspect of the distal tibia.  I was able to align it distally and held it provisionally with a 1.6 mm K wire.  I then drilled and placed a nonlocking screw distally to bring the plate flush to bone.  I confirmed adequate alignment and reduction of the metaphysis.  And then I proceeded to place percutaneous 3.5 millimeter screws into the tibial shaft.  A total of 4 screws were placed.  Once I had provisional fixation of the metaphysis I then reinforced the articular reduction of the distal tibia.  I placed locking screws through the plate and tried to get fixation into the medial malleolus.  Due to the flexibility of the plate she still had some instability through the medial metaphysis that felt like she would fall into varus without supplemental fixation.  I did not want to put hardware underneath the traumatic laceration but I felt that it would not be a high risk for failure if I did not.  As result I proceeded to use a  8 hole one third tubular plate and positioned this along the medial cortex of the tibia.  I drilled and placed a nonlocking screw in the distal segment and a locking screw into the medial malleolus.  I then drilled and placed 2 nonlocking screws into the tibial shaft to complete the construct.  I tried to keep it as flexible as possible to prevent significant rigidity increasing the risk of nonunion.  Final fluoroscopic imaging was obtained.  The incisions were irrigated.  A gram of vancomycin  powder was placed between the 2 incisions.  A layer of closure was done with 0 Vicryl 2-0 Monocryl and 3-0 nylon for the  lateral incisions and 3-0 nylon for the medial traumatic laceration.  The percutaneous incisions were closed with 3-0 nylon.  The Ex-Fix pin sites were closed with 3-0 nylon.  Sterile dressings were applied and a well-padded short leg splint was then placed.  The patient was then awoke from anesthesia and taken to the PACU in stable condition.  Post Op Plan/Instructions: The patient will be nonweightbearing to the left lower extremity.  She will receive postoperative Ancef .  She will be placed on Lovenox  for DVT prophylaxis and discharged on an oral DOAC.  Will have her mobilize with physical and Occupational Therapy.  She can discharge from an orthopedic perspective as early as postoperative day 1.  I was present and performed the entire surgery.  Lauraine Moores, PA-C did assist me throughout the case. An assistant was necessary given the difficulty in approach, maintenance of reduction and ability to instrument the fracture.   Franky Light, MD [facility]

## 2024-06-01 DIAGNOSIS — F32A Depression, unspecified: Secondary | ICD-10-CM

## 2024-06-01 LAB — BASIC METABOLIC PANEL WITH GFR
Anion gap: 16 — ABNORMAL HIGH (ref 5–15)
BUN: 6 mg/dL (ref 6–20)
CO2: 19 mmol/L — ABNORMAL LOW (ref 22–32)
Calcium: 8.1 mg/dL — ABNORMAL LOW (ref 8.9–10.3)
Chloride: 102 mmol/L (ref 98–111)
Creatinine, Ser: 0.48 mg/dL (ref 0.44–1.00)
GFR, Estimated: >60 mL/min
Glucose, Bld: 144 mg/dL — ABNORMAL HIGH (ref 70–99)
Potassium: 3.2 mmol/L — ABNORMAL LOW (ref 3.5–5.1)
Sodium: 136 mmol/L (ref 135–145)

## 2024-06-01 LAB — CBC
HCT: 24.7 % — ABNORMAL LOW (ref 36.0–46.0)
Hemoglobin: 8.1 g/dL — ABNORMAL LOW (ref 12.0–15.0)
MCH: 27.2 pg (ref 26.0–34.0)
MCHC: 32.8 g/dL (ref 30.0–36.0)
MCV: 82.9 fL (ref 80.0–100.0)
Platelets: 255 K/uL (ref 150–400)
RBC: 2.98 MIL/uL — ABNORMAL LOW (ref 3.87–5.11)
RDW: 13.4 % (ref 11.5–15.5)
WBC: 10.6 K/uL — ABNORMAL HIGH (ref 4.0–10.5)
nRBC: 0 % (ref 0.0–0.2)

## 2024-06-01 LAB — GLUCOSE, CAPILLARY
Glucose-Capillary: 169 mg/dL — ABNORMAL HIGH (ref 70–99)
Glucose-Capillary: 156 mg/dL — ABNORMAL HIGH (ref 70–99)
Glucose-Capillary: 226 mg/dL — ABNORMAL HIGH (ref 70–99)
Glucose-Capillary: 157 mg/dL — ABNORMAL HIGH (ref 70–99)

## 2024-06-01 NOTE — Progress Notes (Signed)
 Physical Therapy Treatment Patient Details Name: Caitlyn [name] MRN: [id number] DOB: January 28, 1981 Today's Date: 06/01/2024   History of Present Illness 43 y.o. female admitted 05/27/24 after jumping from 3rd story window (suspected SI attempt vs ETOH abuse vs substance-induced mood disorder). Workup for L rib 3,5-7 fxs, L open tib-fib fx, and posterior hip dislocation with acetabulum fx. S/p L tibial ex fix with wound vac placement, L acetabular ORIF on 12/17. 12/19 removed external fixator with ORIF distal tib-fib and medical malleolus fractures PMH includes chronic R hip pain, anemia, DM, HTN, OSA, remote smoker.    PT Comments  Patient now s/p LLE ORIF, however remains NWB. She reports history of multiple injuries to rt hip prior to current situation. Anticipate will need to learn to do lateral scoot transfers with/without sliding board to wheelchair. Noted previous recommendation for AIR, however they have signed off as pt is needing inpatient psych. Will attempt to maximize her services acutely to allow her to progress to inpatient psych at a wheelchair level. Currently requires +2 mod assist for lateral scoot simulated transfer.     If plan is discharge home, recommend the following: Two people to help with walking and/or transfers;A lot of help with bathing/dressing/bathroom;Assistance with cooking/housework;Assist for transportation;Help with stairs or ramp for entrance   Can travel by private vehicle        Equipment Recommendations  Wheelchair (measurements PT);Wheelchair cushion (measurements PT) (drop-arm BSC; sliding board)    Recommendations for Other Services       Precautions / Restrictions Precautions Precautions: Fall;Other (comment) Recall of Precautions/Restrictions: Intact Precaution/Restrictions Comments: L rib fxs Restrictions Weight Bearing Restrictions Per Provider Order: Yes LLE Weight Bearing Per Provider Order: Non weight bearing     Mobility  Bed  Mobility Overal bed mobility: Needs Assistance Bed Mobility: Rolling Rolling: Max assist   Supine to sit: Max assist, HOB elevated, Used rails Sit to supine: Max assist   General bed mobility comments: exit to her rt with HOB elevated and use of rail    Transfers Overall transfer level: Needs assistance   Transfers: Bed to chair/wheelchair/BSC            Lateral/Scoot Transfers: Mod assist, +2 physical assistance General transfer comment: lateral scoot to her right along EOB x 4 reps (use of bed pad to assist)    Ambulation/Gait               General Gait Details: pt describes significant pain/trauma to RLE prior to current situation; discussed that she will likely need wheelchair as she does not feel she can put all her weight on her RLE   Stairs             Wheelchair Mobility     Tilt Bed    Modified Rankin (Stroke Patients Only)       Balance Overall balance assessment: Needs assistance Sitting-balance support: No upper extremity supported, Feet unsupported Sitting balance-Leahy Scale: Fair Sitting balance - Comments: tolerates prolonged sitting EOB with and without UE support                                    Communication Communication Communication: No apparent difficulties  Cognition Arousal: Alert Behavior During Therapy: WFL for tasks assessed/performed, Anxious   PT - Cognitive impairments: No apparent impairments  Following commands: Intact      Cueing Cueing Techniques: Verbal cues  Exercises Other Exercises Other Exercises: seated BLE LAQ; supine AAROM L SLR    General Comments        Pertinent Vitals/Pain Pain Assessment Pain Assessment: 0-10 Pain Score: 8  Pain Location: L-side ribs, LLE, groin Pain Descriptors / Indicators: Discomfort, Grimacing, Guarding, Sore Pain Intervention(s): Limited activity within patient's tolerance, Monitored during session, Premedicated  before session, Repositioned    Home Living                          Prior Function            PT Goals (current goals can now be found in the care plan section) Acute Rehab PT Goals Patient Stated Goal: decreased pain, regain strength, get back home Time For Goal Achievement: 06/13/24 Potential to Achieve Goals: Good Progress towards PT goals: Progressing toward goals    Frequency    Min 3X/week      PT Plan      Co-evaluation PT/OT/SLP Co-Evaluation/Treatment: Yes Reason for Co-Treatment: Complexity of the patient's impairments (multi-system involvement);For patient/therapist safety;To address functional/ADL transfers PT goals addressed during session: Mobility/safety with mobility;Balance;Strengthening/ROM        AM-PAC PT 6 Clicks Mobility   Outcome Measure  Help needed turning from your back to your side while in a flat bed without using bedrails?: A Lot Help needed moving from lying on your back to sitting on the side of a flat bed without using bedrails?: A Lot Help needed moving to and from a bed to a chair (including a wheelchair)?: Total Help needed standing up from a chair using your arms (e.g., wheelchair or bedside chair)?: Total Help needed to walk in hospital room?: Total Help needed climbing 3-5 steps with a railing? : Total 6 Click Score: 8    End of Session   Activity Tolerance: Patient tolerated treatment well Patient left: in bed;with call bell/phone within reach;with bed alarm set;with family/visitor present Nurse Communication: Mobility status;Patient requests pain meds;Need for lift equipment;Other (comment) (try to find drop-arm recliner for pt's room) PT Visit Diagnosis: Other abnormalities of gait and mobility (R26.89);Muscle weakness (generalized) (M62.81);Pain Pain - Right/Left: Left Pain - part of body: Leg     Time: 9168-9142 PT Time Calculation (min) (ACUTE ONLY): 26 min  Charges:    $Therapeutic Activity: 8-22  mins PT General Charges $$ ACUTE PT VISIT: 1 Visit                      Macario RAMAN, PT Acute Rehabilitation Services  Office [phone]    Macario SHAUNNA Soja 06/01/2024, 10:53 AM

## 2024-06-01 NOTE — Evaluation (Signed)
 Occupational Therapy Evaluation Patient Details Name: Caitlyn [name] MRN: [id number] DOB: Jan 11, 1981 Today's Date: 06/01/2024   History of Present Illness   43 y.o. female admitted 05/27/24 after jumping from 3rd story window (suspected SI attempt vs ETOH abuse vs substance-induced mood disorder). Workup for L rib 3,5-7 fxs, L open tib-fib fx, and posterior hip dislocation with acetabulum fx. S/p L tibial ex fix with wound vac placement, L acetabular ORIF on 12/17. 12/19 removed external fixator with ORIF distal tib-fib and medical malleolus fractures PMH includes chronic R hip pain, anemia, DM, HTN, OSA, remote smoker.     Clinical Impressions Pt now s/p LLE ORIF, remains NWB LLE. Pt requires up to Max A +2 for bed mobility and up to total A for ADL engagement. Pt continues to be limited by pain, decreased activity tolerance, and generalized weakness. OT to continue to follow Pt acutely to facilitate progress towards goals. Noted previous recommendation for AIR, however they have signed off as pt is needing inpatient psych. Will attempt to maximize Pt services acutely to allow her to progress to inpatient psych.      If plan is discharge home, recommend the following:   A lot of help with bathing/dressing/bathroom;Two people to help with walking and/or transfers;Assistance with cooking/housework;Assist for transportation;Help with stairs or ramp for entrance     Functional Status Assessment   Patient has had a recent decline in their functional status and demonstrates the ability to make significant improvements in function in a reasonable and predictable amount of time.     Equipment Recommendations   BSC/3in1;Wheelchair (measurements OT);Wheelchair cushion (measurements OT);Other (comment)     Recommendations for Other Services         Precautions/Restrictions   Precautions Precautions: Fall;Other (comment) Recall of Precautions/Restrictions:  Intact Precaution/Restrictions Comments: L rib fxs     Mobility Bed Mobility Overal bed mobility: Needs Assistance Bed Mobility: Rolling Rolling: Max assist   Supine to sit: Max assist, HOB elevated, Used rails Sit to supine: Max assist   General bed mobility comments: exit to her rt with HOB elevated and use of rail. Max A with use of bed pad    Transfers Overall transfer level: Needs assistance                Lateral/Scoot Transfers: Mod assist, +2 physical assistance General transfer comment: Lateral scoot along EOB to R x4. Use of bed pad +2 to assist.      Balance Overall balance assessment: Needs assistance Sitting-balance support: No upper extremity supported, Feet unsupported Sitting balance-Leahy Scale: Fair Sitting balance - Comments: tolerates prolonged sitting EOB with and without UE support                                   ADL either performed or assessed with clinical judgement   ADL Overall ADL's : Needs assistance/impaired Eating/Feeding: Independent;Sitting   Grooming: Set up;Sitting   Upper Body Bathing: Minimal assistance;Bed level   Lower Body Bathing: Total assistance;Bed level   Upper Body Dressing : Minimal assistance;Bed level   Lower Body Dressing: Total assistance;Bed level     Toilet Transfer Details (indicate cue type and reason): Will need +2 A, lateral scoot to drop arm BSC Toileting- Clothing Manipulation and Hygiene: Total assistance Toileting - Clothing Manipulation Details (indicate cue type and reason): catheter             Vision Patient Visual Report: No  change from baseline Vision Assessment?: No apparent visual deficits     Perception         Praxis         Pertinent Vitals/Pain Pain Assessment Pain Assessment: 0-10 Pain Score: 8  Pain Location: L-side ribs, LLE, groin Pain Descriptors / Indicators: Discomfort, Grimacing, Guarding, Sore Pain Intervention(s): Limited activity within  patient's tolerance, Monitored during session, Patient requesting pain meds-RN notified, Premedicated before session     Extremity/Trunk Assessment Upper Extremity Assessment Upper Extremity Assessment: Generalized weakness (functional observed BUE strength >3/5 though AROM limited by L-side rib/abdominal pain. Compensatory movements noted with LUE mobilization)   Lower Extremity Assessment Lower Extremity Assessment: Defer to PT evaluation       Communication Communication Communication: No apparent difficulties   Cognition Arousal: Alert Behavior During Therapy: WFL for tasks assessed/performed Cognition: No apparent impairments                               Following commands: Intact       Cueing  General Comments   Cueing Techniques: Verbal cues  VSS on RA. Light dizziness noted with sitting, resolved.   Exercises     Shoulder Instructions      Home Living Family/patient expects to be discharged to:: Private residence Living Arrangements: Spouse/significant other;Children Available Help at Discharge: Family;Available PRN/intermittently Type of Home: Apartment Home Access: Level entry     Home Layout: Multi-level     Bathroom Shower/Tub: Chief Strategy Officer: Standard     Home Equipment: [place] - single point   Additional Comments: lives on 3rd floor apartment. 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; mobility and work limited by chronic hip pain (reports she was dx with hip tear recently, though no h/o trauma to hip) and was told to use [facility] ADLs Comments: Ind with ADLs, IADLs, cooking, home mgt    OT Problem List: Decreased strength;Decreased range of motion;Decreased  activity tolerance;Impaired balance (sitting and/or standing);Decreased safety awareness;Decreased knowledge of use of DME or AE;Decreased knowledge of precautions;Pain;Impaired UE functional use   OT Treatment/Interventions: Self-care/ADL training;Patient/family education;Balance training;Therapeutic activities;DME and/or AE instruction;Therapeutic exercise;Energy conservation      OT Goals(Current goals can be found in the care plan section)   Acute Rehab OT Goals Patient Stated Goal: get better OT Goal Formulation: With patient Time For Goal Achievement: 06/13/24 Potential to Achieve Goals: Good   OT Frequency:  Min 2X/week    Co-evaluation PT/OT/SLP Co-Evaluation/Treatment: Yes Reason for Co-Treatment: Complexity of the patient's impairments (multi-system involvement);For patient/therapist safety;To address functional/ADL transfers PT goals addressed during session: Mobility/safety with mobility;Balance;Strengthening/ROM OT goals addressed during session: ADL's and self-care;Proper use of Adaptive equipment and DME;Strengthening/ROM      AM-PAC OT 6 Clicks Daily Activity     Outcome Measure Help from another person eating meals?: None Help from another person taking care of personal grooming?: A Little Help from another person toileting, which includes using toliet, bedpan, or urinal?: Total Help from another person bathing (including washing, rinsing, drying)?: A Lot Help from another person to put on and taking off regular upper body clothing?: A Little Help from another person  to put on and taking off regular lower body clothing?: Total 6 Click Score: 14   End of Session Nurse Communication: Mobility status  Activity Tolerance: Patient tolerated treatment well Patient left: in bed;with call bell/phone within reach;with bed alarm set;with nursing/sitter in room;with family/visitor present  OT Visit Diagnosis: Other abnormalities of gait and mobility (R26.89);Muscle  weakness (generalized) (M62.81);Pain Pain - part of body: Arm;Leg;Ankle and joints of foot                Time: 9168-9142 OT Time Calculation (min): 26 min Charges:  OT General Charges $OT Visit: 1 Visit OT Evaluation $OT Eval Moderate Complexity: 1 Mod  Maurilio CROME, OTR/L.  [facility] Acute Rehabilitation  Office: [phone]   Maurilio PARAS Derell Bruun 06/01/2024, 12:06 PM

## 2024-06-01 NOTE — Plan of Care (Signed)
  Problem: Education: Goal: Individualized Educational Video(s) Outcome: Progressing   Problem: Coping: Goal: Ability to adjust to condition or change in health will improve Outcome: Progressing   Problem: Nutritional: Goal: Maintenance of adequate nutrition will improve Outcome: Progressing

## 2024-06-01 NOTE — Progress Notes (Signed)
 Foley catheter removed per order, tolerated well. Patient has 6 hours to void per order.

## 2024-06-01 NOTE — Progress Notes (Signed)
 "  Progress Note  1 Day Post-Op  Subjective: Went to OR yesterday with ortho. Has some pain this morning but adequately controlled. No acute issues overnight.  Objective: Vital signs in last 24 hours: Temp:  [98.1 F (36.7 C)-99.1 F (37.3 C)] 98.8 F (37.1 C) (12/20 0959) Pulse Rate:  [91-102] 102 (12/20 0959) Resp:  [14-21] 20 (12/20 0959) BP: (136-166)/(82-95) 166/94 (12/20 0959) SpO2:  [91 %-100 %] 98 % (12/20 0959) Last BM Date : 05/31/24  Intake/Output from previous day: 12/19 0701 - 12/20 0700 In: 2498 [P.O.:1198; I.V.:1200; IV Piggyback:100] Out: 2970 [Urine:2950; Blood:20] Intake/Output this shift: No intake/output data recorded.  PE: General: sitting up in bed, NAD HEENT: head is normocephalic, atraumatic.  Lungs: Normal work of breathing on room air Abd: soft, nondistended MS: splint in place LLE, toes warm and well-perfused, sensation in tact Psych: A&Ox3 with an appropriate affect.   Lab Results:  Recent Labs    05/31/24 0301 06/01/24 0653  WBC 11.7* 10.6*  HGB 8.5* 8.1*  HCT 26.1* 24.7*  PLT 253 255   BMET Recent Labs    05/30/24 0601  NA 137  K 3.5  CL 102  CO2 27  GLUCOSE 155*  BUN <5*  CREATININE 0.56  CALCIUM  8.8*   PT/INR No results for input(s): LABPROT, INR in the last 72 hours.  CMP     Component Value Date/Time   NA 137 05/30/2024 0601   NA 135 01/12/2023 1038   K 3.5 05/30/2024 0601   CL 102 05/30/2024 0601   CO2 27 05/30/2024 0601   GLUCOSE 155 (H) 05/30/2024 0601   GLUCOSE 107 (H) 09/09/2013 1237   BUN <5 (L) 05/30/2024 0601   BUN 9 01/12/2023 1038   CREATININE 0.56 05/30/2024 0601   CREATININE 0.62 02/03/2014 0931   CALCIUM  8.8 (L) 05/30/2024 0601   PROT 6.2 (L) 05/30/2024 0601   PROT 7.1 01/12/2023 1038   ALBUMIN  3.7 05/30/2024 0601   ALBUMIN  4.4 01/12/2023 1038   AST 65 (H) 05/30/2024 0601   ALT 30 05/30/2024 0601   ALKPHOS 53 05/30/2024 0601   BILITOT 0.5 05/30/2024 0601   BILITOT 0.3 01/12/2023 1038    GFRNONAA >60 05/30/2024 0601   GFRAA >60 07/02/2019 1311   Lipase     Component Value Date/Time   LIPASE 15 10/17/2008 0950       Studies/Results: DG Foot Complete Right Result Date: 05/31/2024 CLINICAL DATA:  Great toe pain. EXAM: RIGHT FOOT COMPLETE - 3+ VIEW COMPARISON:  None Available. FINDINGS: No acute fracture or dislocation. Mild degenerative change of the great toe metatarsal phalangeal joint. No destructive or erosive change. Mild generalized soft tissue edema. IMPRESSION: Mild degenerative change of the great toe metatarsophalangeal joint. No acute osseous abnormality. Electronically Signed   By: Andrea Gasman M.D.   On: 05/31/2024 16:55   DG Ankle Complete Left Result Date: 05/31/2024 CLINICAL DATA:  Fracture, postop. EXAM: LEFT ANKLE COMPLETE - 3+ VIEW COMPARISON:  Preoperative imaging FINDINGS: Lateral plate and screw fixation of distal fibular fracture. Medial and lateral plate and screw fixation of comminuted distal tibial fracture. Ghost tracks in the tibia from prior external fixator. Postoperative air and edema in the soft tissues. Overlying splint material limits osseous and soft tissue fine detail. IMPRESSION: ORIF of distal tibial and fibular fractures. Electronically Signed   By: Andrea Gasman M.D.   On: 05/31/2024 16:53   DG Ankle Complete Left Result Date: 05/31/2024 CLINICAL DATA:  Elective surgery. EXAM: LEFT ANKLE COMPLETE -  3+ VIEW COMPARISON:  Radiograph 05/29/2024 FINDINGS: Twelve fluoroscopic spot views of the ankle submitted from the operating room. Lateral plate and screw fixation of distal fibular fracture. Medial and lateral plate and screw fixation of comminuted distal tibial fracture. Previous external fixator has been removed. Fluoroscopy time 271.3 seconds. Dose 4.93 mGy. IMPRESSION: Intraoperative fluoroscopy during ORIF of distal tibial and fibular fractures. Electronically Signed   By: Andrea Gasman M.D.   On: 05/31/2024 16:52   DG C-Arm  1-60 Min-No Report Result Date: 05/31/2024 Fluoroscopy was utilized by the requesting physician.  No radiographic interpretation.   DG C-Arm 1-60 Min-No Report Result Date: 05/31/2024 Fluoroscopy was utilized by the requesting physician.  No radiographic interpretation.    Anti-infectives: Anti-infectives (From admission, onward)    Start     Dose/Rate Route Frequency Ordered Stop   05/31/24 2000  ceFAZolin  (ANCEF ) IVPB 2g/100 mL premix        2 g 200 mL/hr over 30 Minutes Intravenous Every 8 hours 05/31/24 1612 06/01/24 2159   05/31/24 1114  vancomycin  (VANCOCIN ) powder  Status:  Discontinued          As needed 05/31/24 1116 05/31/24 1353   05/31/24 1041  ceFAZolin  (ANCEF ) 2-4 GM/100ML-% IVPB       Note to Pharmacy: Ezequiel Henri: cabinet override      05/31/24 1041 05/31/24 2244   05/29/24 2200  ceFAZolin  (ANCEF ) IVPB 2g/100 mL premix        2 g 200 mL/hr over 30 Minutes Intravenous Every 8 hours 05/29/24 1818 05/30/24 1529   05/29/24 1116  vancomycin  (VANCOCIN ) powder  Status:  Discontinued          As needed 05/29/24 1117 05/29/24 1314   05/28/24 0600  cefTRIAXone  (ROCEPHIN ) 1 g in sodium chloride  0.9 % 100 mL IVPB        1 g 200 mL/hr over 30 Minutes Intravenous Every 24 hours 05/28/24 0450 05/29/24 0650   05/27/24 2256  vancomycin  (VANCOCIN ) powder  Status:  Discontinued          As needed 05/27/24 2257 05/28/24 0017   05/27/24 1915  ceFAZolin  (ANCEF ) IVPB 2g/100 mL premix        2 g 200 mL/hr over 30 Minutes Intravenous STAT 05/27/24 1911 05/27/24 2000        Assessment/Plan Jump out of window   Suspected suicide attempt - psych consulted, recommend 1:1 observation and inpatient psych admission. L rib fx 3/5-7 - pain control, pulm toilet  L open tib-fib fx - per Dr. Kendal, s/p ORIF L acetabulum, ex-fix L ankle 12/16, closed reduction and washout with VAC placement 12/17. S/p ORIF L tib-fib and L malleolus fx 12/19. NWB LLE.  Posterior L hip dislocation with  associated acetabulum fx - per Dr. Kendal, s/p ORIF 12/17 ABL anemia - S/p 1U PRBC 12/18. Hgb stable at 8.1 this am. R toe pain - xray negative for acute fracture  FEN - Regular diet, bowel regimen DVT - SCDs, LMWH ID - Cefazolin  for 24 hours postop per ortho for open fracture Foley - Remove today Dispo - 6N, PT/OT.  Psych rec inpatient psych. TOC checking if candidate for [facility] or [facility] with current mobility, otherwise may need central.    LOS: 5 days   I reviewed Consultant psychiatry, orthopedic surgery notes, last 24 h vitals and pain scores, last 48 h intake and output, last 24 h labs and trends, and last 24 h imaging results.  This care required moderate level of  medical decision making.    Leonor LITTIE Dawn, MD  [facility] Surgery 06/01/2024, 11:04 AM Please see Amion for pager number during day hours 7:00am-4:30pm  "

## 2024-06-01 NOTE — Progress Notes (Signed)
 Orthopaedic Trauma Progress Note  SUBJECTIVE: Doing okay this morning. Nerve block wore off around 5 am. She feels more pressure in the ankle, but no significant pain. Family member at bedside.   OBJECTIVE:  Vitals:   05/31/24 2324 06/01/24 0511  BP: 136/82 (!) 152/95  Pulse: 98 94  Resp: 17 17  Temp: 98.3 F (36.8 C) 98.1 F (36.7 C)  SpO2: 91% 96%    Opiates Today (MME): Today's  total administered Morphine  Milligram Equivalents: 15 Opiates Yesterday (MME): Yesterday's total administered Morphine  Milligram Equivalents: 90  General: Sitting up in bed, no acute distress.  Pleasant and cooperative Respiratory: No increased work of breathing.  Operative Extremity (LLE): Dressing over the hip is clean, dry, and intact. splint CDI, +EHL though remainder of motor difficult to test due to splint, sensation intact distally with warm well perfused foot, no pain w passive stretch   IMAGING: Stable post op imaging   LABS:  Results for orders placed or performed during the hospital encounter of 05/27/24 (from the past 24 hours)  Glucose, capillary     Status: Abnormal   Collection Time: 05/31/24 10:34 AM  Result Value Ref Range   Glucose-Capillary 139 (H) 70 - 99 mg/dL   Comment 1 Notify RN    Comment 2 Document in Chart   Glucose, capillary     Status: Abnormal   Collection Time: 05/31/24  2:02 PM  Result Value Ref Range   Glucose-Capillary 176 (H) 70 - 99 mg/dL  Glucose, capillary     Status: Abnormal   Collection Time: 05/31/24  4:33 PM  Result Value Ref Range   Glucose-Capillary 239 (H) 70 - 99 mg/dL   Comment 1 Notify RN   Glucose, capillary     Status: Abnormal   Collection Time: 05/31/24  9:13 PM  Result Value Ref Range   Glucose-Capillary 222 (H) 70 - 99 mg/dL   Comment 1 Notify RN   CBC     Status: Abnormal   Collection Time: 06/01/24  6:53 AM  Result Value Ref Range   WBC 10.6 (H) 4.0 - 10.5 K/uL   RBC 2.98 (L) 3.87 - 5.11 MIL/uL   Hemoglobin 8.1 (L) 12.0 - 15.0  g/dL   HCT 75.2 (L) 63.9 - 53.9 %   MCV 82.9 80.0 - 100.0 fL   MCH 27.2 26.0 - 34.0 pg   MCHC 32.8 30.0 - 36.0 g/dL   RDW 86.5 88.4 - 84.4 %   Platelets 255 150 - 400 K/uL   nRBC 0.0 0.0 - 0.2 %  Glucose, capillary     Status: Abnormal   Collection Time: 06/01/24  7:37 AM  Result Value Ref Range   Glucose-Capillary 169 (H) 70 - 99 mg/dL    ASSESSMENT: Caitlyn [name] is a 43 y.o. female, 1 Day Post-Op s/p jump from window Procedures:  OPEN REDUCTION INTERNAL FIXATION LEFT TRANSVERSE/POSTERIOR WALL ACETABULUM FRACTURE DISLOCATION on 12/17 - POD #3 CLOSED REDUCTION LEFT DISTAL TIBIA FRACTURE WITH ADJUSTMENT OF EXTERNAL FIXATION - 12/19 - POD #1  CV/Blood loss: Acute blood loss anemia, Hgb 8.1 this AM.  Hemodynamically stable  PLAN: Weightbearing: NWB LLE ROM: Okay for hip and knee ROM.   Incisional and dressing care: Reinforce dressings as needed for the left hip. Splint to remain intact. Keep clean and dry.  Showering: Okay for bed bath Orthopedic device(s): splint Pain management:  1. Tylenol  [phone] mg q 6 hours  2. Robaxin  1000 mg q 8 hours 3. Oxycodone  5-10 mg  q 4 hours PRN 4. Dilaudid  0.5 mg q 2 hours PRN 5. Neurontin  100 mg 3 times daily 6.  Lidoderm  5% patch every 24 hours VTE prophylaxis: Lovenox , SCDs ID:  Ancef  2gm post op per open fracture protocol Foley/Lines: Foley in place.  KVO IVFs Impediments to Fracture Healing: Vitamin D  level <6, started on supplementation  Dispo: PT/OT evaluation today as able. Dispo per primary team  Follow - up plan: 2 weeks in office with Dr. Kendal Pass information:  After hours and holidays please check [email or web address] for group call information for [facility]  Aleck Stalling, PA-C 06/01/24
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
## 2024-06-02 ENCOUNTER — Inpatient Hospital Stay (HOSPITAL_COMMUNITY)

## 2024-06-02 LAB — GLUCOSE, CAPILLARY
Glucose-Capillary: 154 mg/dL — ABNORMAL HIGH (ref 70–99)
Glucose-Capillary: 138 mg/dL — ABNORMAL HIGH (ref 70–99)
Glucose-Capillary: 195 mg/dL — ABNORMAL HIGH (ref 70–99)
Glucose-Capillary: 148 mg/dL — ABNORMAL HIGH (ref 70–99)

## 2024-06-02 NOTE — Plan of Care (Signed)

## 2024-06-02 NOTE — Progress Notes (Signed)
 Orthopaedic Trauma Progress Note  SUBJECTIVE: Doing okay this morning. Had a little more pain yesterday. Plan is for inpatient psych.   OBJECTIVE:  Vitals:   06/02/24 0053 06/02/24 0426  BP: (!) 150/88 135/89  Pulse: 94 94  Resp:  17  Temp:  98.2 F (36.8 C)  SpO2:  95%    Opiates Today (MME): Today's  total administered Morphine  Milligram Equivalents: 0 Opiates Yesterday (MME): Yesterday's total administered Morphine  Milligram Equivalents: 45  General: Sitting up in bed, no acute distress.  Pleasant and cooperative Respiratory: No increased work of breathing.  Operative Extremity (LLE): Dressing over the hip is clean, dry, and intact. splint CDI, +EHL though remainder of motor difficult to test due to splint, sensation intact distally with warm well perfused foot, no pain w passive stretch   IMAGING: Stable post op imaging   LABS:  Results for orders placed or performed during the hospital encounter of 05/27/24 (from the past 24 hours)  Glucose, capillary     Status: Abnormal   Collection Time: 06/01/24 12:32 PM  Result Value Ref Range   Glucose-Capillary 156 (H) 70 - 99 mg/dL  Glucose, capillary     Status: Abnormal   Collection Time: 06/01/24  4:57 PM  Result Value Ref Range   Glucose-Capillary 226 (H) 70 - 99 mg/dL  Glucose, capillary     Status: Abnormal   Collection Time: 06/01/24 10:21 PM  Result Value Ref Range   Glucose-Capillary 157 (H) 70 - 99 mg/dL    ASSESSMENT: Caitlyn [name] is a 43 y.o. female, 2 Days Post-Op s/p jump from window Procedures:  OPEN REDUCTION INTERNAL FIXATION LEFT TRANSVERSE/POSTERIOR WALL ACETABULUM FRACTURE DISLOCATION on 12/17 - POD #3 CLOSED REDUCTION LEFT DISTAL TIBIA FRACTURE WITH ADJUSTMENT OF EXTERNAL FIXATION - 12/19 - POD #1  CV/Blood loss: Acute blood loss anemia, Hgb 8.1 yesterday  Hemodynamically stable  PLAN: Weightbearing: NWB LLE ROM: Okay for hip and knee ROM.   Incisional and dressing care: Reinforce dressings as  needed for the left hip. Splint to remain intact. Keep clean and dry.  Showering: Okay for bed bath Orthopedic device(s): Splint Pain management:  1. Tylenol  [phone] mg q 6 hours  2. Robaxin  1000 mg q 8 hours 3. Oxycodone  5-10 mg q 4 hours PRN 4. Dilaudid  0.5 mg q 2 hours PRN 5. Neurontin  100 mg 3 times daily 6.  Lidoderm  5% patch every 24 hours VTE prophylaxis: Lovenox , SCDs ID:  Ancef  2gm post op per open fracture protocol Foley/Lines: Foley in place.  KVO IVFs Impediments to Fracture Healing: Vitamin D  level <6, started on supplementation  Dispo: Okay to discharge to inpatient psych from orthopedic standpoint  Follow - up plan: 2 weeks in office with Dr. Kendal Pass information:  After hours and holidays please check [email or web address] for group call information for [facility]  Aleck Stalling, PA-C 06/02/24

## 2024-06-02 NOTE — Progress Notes (Signed)
 "  Progress Note  2 Days Post-Op  Subjective: Pain controlled today. Worked with therapies yesterday.  Objective: Vital signs in last 24 hours: Temp:  [98.2 F (36.8 C)-99.3 F (37.4 C)] 98.3 F (36.8 C) (12/21 1113) Pulse Rate:  [94-109] 109 (12/21 1113) Resp:  [15-20] 15 (12/21 1113) BP: (130-172)/(80-111) 130/80 (12/21 1113) SpO2:  [93 %-98 %] 94 % (12/21 1113) Last BM Date : 05/31/24  Intake/Output from previous day: 12/20 0701 - 12/21 0700 In: -  Out: 800 [Urine:800] Intake/Output this shift: No intake/output data recorded.  PE: General: sitting up in bed, NAD HEENT: head is normocephalic, atraumatic.  Lungs: Normal work of breathing on room air Abd: soft, nondistended MS: splint in place LLE, toes warm and well-perfused, sensation in tact Psych: A&Ox3 with an appropriate affect.   Lab Results:  Recent Labs    05/31/24 0301 06/01/24 0653  WBC 11.7* 10.6*  HGB 8.5* 8.1*  HCT 26.1* 24.7*  PLT 253 255   BMET Recent Labs    06/01/24 0653  NA 136  K 3.2*  CL 102  CO2 19*  GLUCOSE 144*  BUN 6  CREATININE 0.48  CALCIUM  8.1*   PT/INR No results for input(s): LABPROT, INR in the last 72 hours.  CMP     Component Value Date/Time   NA 136 06/01/2024 0653   NA 135 01/12/2023 1038   K 3.2 (L) 06/01/2024 0653   CL 102 06/01/2024 0653   CO2 19 (L) 06/01/2024 0653   GLUCOSE 144 (H) 06/01/2024 0653   GLUCOSE 107 (H) 09/09/2013 1237   BUN 6 06/01/2024 0653   BUN 9 01/12/2023 1038   CREATININE 0.48 06/01/2024 0653   CREATININE 0.62 02/03/2014 0931   CALCIUM  8.1 (L) 06/01/2024 0653   PROT 6.2 (L) 05/30/2024 0601   PROT 7.1 01/12/2023 1038   ALBUMIN  3.7 05/30/2024 0601   ALBUMIN  4.4 01/12/2023 1038   AST 65 (H) 05/30/2024 0601   ALT 30 05/30/2024 0601   ALKPHOS 53 05/30/2024 0601   BILITOT 0.5 05/30/2024 0601   BILITOT 0.3 01/12/2023 1038   GFRNONAA >60 06/01/2024 0653   GFRAA >60 07/02/2019 1311   Lipase     Component Value Date/Time    LIPASE 15 10/17/2008 0950       Studies/Results: DG Ankle Complete Left Result Date: 05/31/2024 CLINICAL DATA:  Fracture, postop. EXAM: LEFT ANKLE COMPLETE - 3+ VIEW COMPARISON:  Preoperative imaging FINDINGS: Lateral plate and screw fixation of distal fibular fracture. Medial and lateral plate and screw fixation of comminuted distal tibial fracture. Ghost tracks in the tibia from prior external fixator. Postoperative air and edema in the soft tissues. Overlying splint material limits osseous and soft tissue fine detail. IMPRESSION: ORIF of distal tibial and fibular fractures. Electronically Signed   By: Andrea Gasman M.D.   On: 05/31/2024 16:53   DG Ankle Complete Left Result Date: 05/31/2024 CLINICAL DATA:  Elective surgery. EXAM: LEFT ANKLE COMPLETE - 3+ VIEW COMPARISON:  Radiograph 05/29/2024 FINDINGS: Twelve fluoroscopic spot views of the ankle submitted from the operating room. Lateral plate and screw fixation of distal fibular fracture. Medial and lateral plate and screw fixation of comminuted distal tibial fracture. Previous external fixator has been removed. Fluoroscopy time 271.3 seconds. Dose 4.93 mGy. IMPRESSION: Intraoperative fluoroscopy during ORIF of distal tibial and fibular fractures. Electronically Signed   By: Andrea Gasman M.D.   On: 05/31/2024 16:52   DG C-Arm 1-60 Min-No Report Result Date: 05/31/2024 Fluoroscopy was utilized by the  requesting physician.  No radiographic interpretation.   DG C-Arm 1-60 Min-No Report Result Date: 05/31/2024 Fluoroscopy was utilized by the requesting physician.  No radiographic interpretation.     Assessment/Plan Jump out of window   Suspected suicide attempt - psych consulted, recommend 1:1 observation and inpatient psych admission. L rib fx 3/5-7 - pain control, pulm toilet  L open tib-fib fx - per Dr. Kendal, s/p ORIF L acetabulum, ex-fix L ankle 12/16, closed reduction and washout with VAC placement 12/17. S/p ORIF L  tib-fib and L malleolus fx 12/19. NWB LLE.  Posterior L hip dislocation with associated acetabulum fx - per Dr. Kendal, s/p ORIF 12/17 ABL anemia - S/p 1U PRBC 12/18. Hgb stable at 8.1 this am. R toe pain - xray negative for acute fracture  FEN - Regular diet, bowel regimen DVT - SCDs, LMWH ID - Periop ancef  completed Dispo - 6N, PT/OT.  Psych rec inpatient psych. TOC checking if candidate for [facility] or [facility] with current mobility, otherwise may need central.    LOS: 6 days   I reviewed Consultant psychiatry, orthopedic surgery notes, last 24 h vitals and pain scores, last 48 h intake and output, last 24 h labs and trends, and last 24 h imaging results.  This care required straight-forward level of medical decision making.    Leonor LITTIE Dawn, MD  [facility] 06/02/2024, 11:55 AM Please see Amion for pager number during day hours 7:00am-4:30pm  "

## 2024-06-02 NOTE — Plan of Care (Signed)
" °  Problem: Education: Goal: Ability to describe self-care measures that may prevent or decrease complications (Diabetes Survival Skills Education) will improve Outcome: Progressing Goal: Individualized Educational Video(s) Outcome: Progressing   Problem: Coping: Goal: Ability to adjust to condition or change in health will improve Outcome: Progressing   Problem: Fluid Volume: Goal: Ability to maintain a balanced intake and output will improve Outcome: Progressing   Problem: Health Behavior/Discharge Planning: Goal: Ability to identify and utilize available resources and services will improve Outcome: Progressing Goal: Ability to manage health-related needs will improve Outcome: Progressing   Problem: Metabolic: Goal: Ability to maintain appropriate glucose levels will improve Outcome: Progressing   Problem: Nutritional: Goal: Maintenance of adequate nutrition will improve Outcome: Progressing Goal: Progress toward achieving an optimal weight will improve Outcome: Progressing   Problem: Skin Integrity: Goal: Risk for impaired skin integrity will decrease Outcome: Progressing   Problem: Tissue Perfusion: Goal: Adequacy of tissue perfusion will improve Outcome: Progressing   Problem: Education: Goal: Knowledge of General Education information will improve Description: Including pain rating scale, medication(s)/side effects and non-pharmacologic comfort measures Outcome: Progressing   Problem: Health Behavior/Discharge Planning: Goal: Ability to manage health-related needs will improve Outcome: Progressing   Problem: Clinical Measurements: Goal: Ability to maintain clinical measurements within normal limits will improve Outcome: Progressing Goal: Will remain free from infection Outcome: Progressing Goal: Diagnostic test results will improve Outcome: Progressing Goal: Respiratory complications will improve Outcome: Progressing Goal: Cardiovascular complication will  be avoided Outcome: Progressing   Problem: Activity: Goal: Risk for activity intolerance will decrease Outcome: Progressing   Problem: Nutrition: Goal: Adequate nutrition will be maintained Outcome: Progressing   Problem: Coping: Goal: Level of anxiety will decrease Outcome: Progressing   Problem: Elimination: Goal: Will not experience complications related to bowel motility Outcome: Progressing Goal: Will not experience complications related to urinary retention Outcome: Progressing   Problem: Pain Managment: Goal: General experience of comfort will improve and/or be controlled Outcome: Progressing   Problem: Safety: Goal: Ability to remain free from injury will improve Outcome: Progressing   Problem: Skin Integrity: Goal: Risk for impaired skin integrity will decrease Outcome: Progressing   Problem: Education: Goal: Ability to describe self-care measures that may prevent or decrease complications (Diabetes Survival Skills Education) will improve Outcome: Progressing   "

## 2024-06-03 ENCOUNTER — Encounter (HOSPITAL_COMMUNITY): Payer: Self-pay | Admitting: Student

## 2024-06-03 ENCOUNTER — Inpatient Hospital Stay (HOSPITAL_COMMUNITY)

## 2024-06-03 DIAGNOSIS — M25511 Pain in right shoulder: Secondary | ICD-10-CM | POA: Diagnosis not present

## 2024-06-03 LAB — GLUCOSE, CAPILLARY
Glucose-Capillary: 172 mg/dL — ABNORMAL HIGH (ref 70–99)
Glucose-Capillary: 181 mg/dL — ABNORMAL HIGH (ref 70–99)
Glucose-Capillary: 173 mg/dL — ABNORMAL HIGH (ref 70–99)
Glucose-Capillary: 169 mg/dL — ABNORMAL HIGH (ref 70–99)

## 2024-06-03 MED ORDER — AMLODIPINE BESYLATE 5 MG PO TABS
5.0000 mg | ORAL_TABLET | Freq: Every day | ORAL | Status: DC
  Administered 2024-06-03 – 2024-06-08 (×6): 5 mg via ORAL
  Filled 2024-06-03 (×6): qty 1

## 2024-06-03 NOTE — Progress Notes (Signed)
 Called for high BP. She appears to have many pressures above 140/90 during hospitalization. Will start 5 mg amlodipine  and diagnose with essential hypertension

## 2024-06-03 NOTE — Consult Note (Addendum)
 [facility] Psychiatry Face-to-Face Psychiatric Evaluation Follow Up   Service Date: June 03, 2024 LOS:  LOS: 7 days    Assessment  Caitlyn [name] is a 43 y.o. female admitted medically on 05/27/2024  7:06 PM for multiple fractures including left ribs, left hip and tib-fib fracture following a level 2 trauma after jumping from a third story window.  Patient carries no former psychiatric diagnoses.  She has a significant medical history consisting of diabetes mellitus, hypertension, hyperlipidemia, obstructive sleep apnea, chronic hip pain and GERD.  Psychiatry was consulted due to concern for suicide attempt.  Patient unable to recall specific events with her leading to falling out of the window. Patient stays on the 3rd floor and EMS suspects it was a SI attempt. Suspect patient is minimizing alcohol and could have some underlying depressive mood symptoms. She denies that it was SI attempt or any SI ideations previously or currently.  Patient's blood alcohol level was 186 upon admission, patient's husband also voiced concerns for patients drinking behavior and he suspects, she has been sneaking to drink. Husband, doesn't believe it was a suicide attempt, and states she has had memory issues previously before. Current differential includes alcohol abuse versus substance-induced mood disorder versus acute psychosis, unspecified.  Patient does report some low mood, issues with sleep, feelings of hopelessness and inability to perform some task in the setting of ongoing hip, knee and back pain.  Patient reports her pain limits her ability to work, and enjoy certain aspects of her life as she once did. To address mood symptoms and pain will start Cymbalta .   Will also add on low-dose gabapentin  to assist with pain control and could be helpful if patient experiencing any anxiety.  As of now tentatively recommending inpatient psychiatric treatment.  Updated Assessment:  12/18 Went to assess patient  at bedside, husband and sister were there for support.  Husband eventually left.  Patient continues to deny any significant mood symptoms, and is tolerating Cymbalta  without difficulty.  Reports improvement with mood.  She continues to not understand, the purpose of her attempting to grab the latch on the wall window underneath the third story window.  She denies any suicidal ideations, homicidal ideation auditory visual hallucinations.  Patient states that she is scheduled to have another procedure done either Friday or Monday.  Will continue to follow along with psych to consider disposition planning of inpatient psychiatric treatment vs follow-up outpatient resources.   12/19: The plan remains unchanged.  Although she denies any active suicidal or homicidal ideations, we will continue with planning for inpatient psychiatric once she is medically stable.  Patient is currently in surgery.   12/20: Patient seen face to face in her hospital room with her sister and 1:1 staff at bedside. She is awake, alert and oriented x 4. Patient appears calm and cooperative and reports doing well today. Patient reports she slept well overnight and does not experience pain. She denies depressive symptoms, anxiety, nervousness, worry, psychosis, suicidal or homicidal ideation, intent or plan. She continues to deny suicidal intent for jumping out of the 3rd floor window.   12/22: Patient continues to strongly deny that this was a suicide attempt. Patient's husband also confirms that this was not a suicide attempt but there is concern of patient's alcohol use. Given the duration of the hospital stay and seeing improvement of symptoms while being started on psychotropic meds, we proceeded with safety planning with her husband, discussing 24/7 oversight until her outpatient followup  appoint with psychiatry. I communicated this with patient's husband and he plans on temporarily moving in to his mother's home and he agrees to  closely monitor the patient and on the hours that he is unable to, his mother and 35 year old daughter will be able to monitor her until her outpatient psychiatry appointment.   At this time, she does not meet criteria for inpatient psychiatric hospitalization as she is not an imminent danger to herself or others and she contracts for safety. Patient is tolerating Cymbalta  during this hospitalization. She would greatly benefit from substance use treatment and patient's husband is in agreement.   Diagnoses:  Active Hospital problems: Principal Problem:   Pelvic fracture (HCC) Active Problems:   Type I or II open fracture of left ankle   Dislocation of hip, posterior, left, closed (HCC)   Open pilon fracture, left, type I or II, initial encounter   Plan  ## Safety and Observation Level:  - Based on my clinical evaluation, I estimate the patient to be at moderate risk of self harm in the current setting - At this time, we recommend a one-to-one level of observation. This decision is based on my review of the chart including patient's history and current presentation, interview of the patient, mental status examination, and consideration of suicide risk including evaluating suicidal ideation, plan, intent, suicidal or self-harm behaviors, risk factors, and protective factors. This judgment is based on our ability to directly address suicide risk, implement suicide prevention strategies and develop a safety plan while the patient is in the clinical setting. Please contact our team if there is a concern that risk level has changed.  ## Medications:  -- Continue Cymbalta  20 mg daily for depression/neuropathic pain - Continue Gabapentin  100 mg 3 times daily for neuropathic pain - Continue Melatonin 5 mg at bedtime for sleep difficulties  ## Medical Decision Making Capacity:  Not specifically addressed  ## Further Work-up:  - TSH WNL, Vitamin B12 WNL, Folate WNL, Vitamin D  <6 -- most recent EKG  on 12/15 f/u formal read -- Pertinent labwork reviewed earlier this admission includes: CBC with leukocytosis of 11.8, ethanol level of 186, hypokalemia of 5.3, lactic acid 6.3, UDS negative, AST at 116, ALT of 49, creatinine peaked at 1.40  ## Disposition:  -- Appropriate disposition is outpatient psychiatry with substance use management  ## Behavioral / Environmental:  -- None  ##Legal Status No ongoing legal issues  Thank you for this consult request. Recommendations have been communicated to the primary team.  We will we will continue to follow at this time.   Ismael KATHEE Franco, MD   NEW history  Relevant Aspects of Hospital Course:  Admitted on 05/27/2024 as a level 2 trauma after jumping from a third story window. EMS thinks that the patient jumped intentionally. The patient is unwilling to provide a history as to why she jumped out of the window.  Orthopedics was consulted for a left hip dislocation with an associated acetabulum fracture and a left open pilon fracture. She was admitted to the trauma service.  She underwent an irrigation and debridement of left open pilon fracture and external fixation with left hip closed reduction.  Patient Report:  12/22: Patient seen sitting up in her bed, no acute distress. She reports feeling okay. She recounts the events that brought her to the hospital, stating that she was looking over the ledge and she thought that she could make it to the ledge one floor down but she  missed so when she was hanging off of the ledge, she cried for help. She states this event was not a suicide attempt and states If I was trying to kill myself, I would not have screamed for help. She provides consent in contacting her family members. She denies SI, HI, and AVH. She notes tolerating Cymbalta  so far.   Collateral information taken previously:  Patient's husband was bedside and contributed to history, also discussed further collateral and the hallway  Husband  denies that he thinks that incident that occurred at home was a suicide attempt.  He suspects that she was drinking and sent him to get the kids.  He says in the past he has suspected that her drinking has been worsening, however he always throws alcohol out of the house whenever he finds it.  Reports that she has had lapses in memory and difficulty with recall and prior instances.  He suspects that she needs help with her drinking.  Collateral information on 12/22: Spoke with husband on 12/22, he is concerned about her alcohol use and feels this is occurring almost daily. He denies that this situation was a suicide attempt and feels pt is appropriate to return home when she is medically stabilized.SABRA He wants to patient to get resources for substance use treatment and depression. I discussed the options of OP psychiatry and CDIOP. I discussed that if the pt does become a safety concern and is an immident danger to herself or others that she can go to [facility] for urgent management. He also plans on moving in with his mother's home who lives in a home. He also will be able to monitor the patient very frequently and on the hours that he has to pick up the children, his eldest daughter who is 44 and his mother can monitor her.   Psychiatric History:  Information collected from patient Patient denies any prior psychiatric medication trials. Patient denies any current psychiatric provider or therapy utilized. No prior psychiatric hospitalizations, no prior history of suicidal ideations, attempts or self injures behavior.  Family psych history: Son, ADHD No further psychiatric history disclosed  Social History:  Patient states that she grew up in [place]  with her mom, dad and 14 other siblings.  She reports that she graduated high school in [place]  and completed online.  She later moved to [place] with her mom.  She currently lives with her husband and 2 daughters and 2 sons.  Respective ages  are 75 and 39, 26 and 63.  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  Patient been married to her current husband since 2020, however they have been dating since 2009.  She reports that she feels supported by her husband.  She denies any ongoing legal issues and does not possess any firearms  Tobacco use: Denies current use, remote use 20 years ago Alcohol use: Reports occasional drinking on the weekend 1-2 mixed drinks of vodka and pineapple juice Drug use: Denies any further current use  Family History:   The patient's family history is not on file.  Medical History: Past Medical History:  Diagnosis Date   Anemia    Diabetes mellitus without complication (HCC)    GM - Diet controlled - no meds   Hypertension    PIH with pregnancy   Sleep apnea    does not use  CPAP - lost weight   SVD (spontaneous vaginal delivery)    x 2    Surgical History: Past Surgical History:  Procedure Laterality Date   CESAREAN SECTION  03/13/2010   CESAREAN SECTION WITH BILATERAL TUBAL LIGATION N/A 02/25/2014   Procedure: CESAREAN SECTION WITH BILATERAL TUBAL LIGATION;  Surgeon: Lang JINNY Peel, DO;  Location: [facility];  Service: Obstetrics;  Laterality: N/A;   EXTERNAL FIXATION LEG Left 05/27/2024   Procedure: EXTERNAL FIXATION, LOWER EXTREMITY;  Surgeon: Georgina Ozell LABOR, MD;  Location: [facility] OR;  Service: Orthopedics;  Laterality: Left;   EXTERNAL FIXATION, ANKLE Left 05/29/2024   Procedure: ADJUSTMENT EXTERNAL FIXATION, ANKLE;  Surgeon: Kendal Franky SQUIBB, MD;  Location: [facility] OR;  Service: Orthopedics;  Laterality: Left;   HIP CLOSED REDUCTION Left 05/27/2024   Procedure: CLOSED REDUCTION, HIP AND APPLICATION TRACTION WITH PIN PLACEMENT;  Surgeon: Georgina Ozell LABOR, MD;  Location: [facility] OR;  Service: Orthopedics;  Laterality: Left;   IRRIGATION AND DEBRIDEMENT POSTERIOR HIP Left 05/27/2024    Procedure: IRRIGATION AND DEBRIDEMENT, OPEN FRACTURE;  Surgeon: Georgina Ozell LABOR, MD;  Location: [facility];  Service: Orthopedics;  Laterality: Left;   OPEN REDUCTION INTERNAL FIXATION (ORIF) TIBIA/FIBULA FRACTURE Left 05/31/2024   Procedure: OPEN REDUCTION INTERNAL FIXATION (ORIF) OF LEFT DISTAL TIBIA/FIBULA FRACTURE;  Surgeon: Kendal Franky SQUIBB, MD;  Location: [facility];  Service: Orthopedics;  Laterality: Left;   OPEN REDUCTION INTERNAL FIXATION ACETABULUM POSTERIOR LATERAL Left 05/29/2024   Procedure: OPEN REDUCTION INTERNAL FIXATION ACETABULUM POSTERIOR LATERAL;  Surgeon: Kendal Franky SQUIBB, MD;  Location: [facility] OR;  Service: Orthopedics;  Laterality: Left;   TOOTH EXTRACTION      Medications:  Current Medications[1]  Allergies: Allergies[2]     Objective  Vital signs:  Temp:  [98.2 F (36.8 C)-98.5 F (36.9 C)] 98.5 F (36.9 C) (12/22 0911) Pulse Rate:  [85-109] 100 (12/22 0911) Resp:  [15-18] 18 (12/22 0911) BP: (130-182)/(80-99) 182/99 (12/22 0911) SpO2:  [93 %-100 %] 100 % (12/22 0911)  Psychiatric Specialty Exam:  Presentation  General Appearance: Appropriate for Environment; Fairly Groomed  Eye Contact:Good  Speech:Clear and Coherent; Normal Rate  Speech Volume:Normal  Handedness:Right   Mood and Affect  Mood:Euthymic  Affect:Congruent; Appropriate   Thought Process  Thought Processes:Coherent; Goal Directed; Linear  Descriptions of Associations:Intact  Orientation:Full (Time, Place and Person)  Thought Content:Logical  History of Schizophrenia/Schizoaffective disorder:no Duration of Psychotic Symptoms: na Hallucinations:Hallucinations: None    Ideas of Reference:None  Suicidal Thoughts:Suicidal Thoughts: No    Homicidal Thoughts:Homicidal Thoughts: No     Sensorium  Memory:Immediate Good; Recent Good; Remote Good  Judgment:Fair  Insight:Present   Executive Functions  Concentration:Fair  Attention Span:Fair  Recall:Fair  Fund of  Knowledge:Fair  Language:Good   Psychomotor Activity  Psychomotor Activity:Psychomotor Activity: Normal   Assets  Assets:Communication Skills; Desire for Improvement; Resilience  Sleep  Sleep:Sleep: Fair   Physical Exam: Physical Exam Review of Systems  Musculoskeletal:  Positive for back pain and joint pain.  Psychiatric/Behavioral:  Negative for depression, hallucinations, substance abuse and suicidal ideas. The patient has insomnia. The patient is not nervous/anxious.    Blood pressure (!) 182/99, pulse 100, temperature 98.5 F (36.9 C), temperature source Oral, resp. rate 18, height 5' 4 (1.626 m), weight 103.4 kg, last menstrual period 05/20/2024, SpO2 100%. Body mass index is 39.14 kg/m.   Ismael Franco, MD PGY-3 Psychiatry Resident       [1]  Current Facility-Administered Medications:    acetaminophen  (TYLENOL ) tablet 1,000 mg, 1,000 mg, Oral,  Q6H, Danton Lauraine LABOR, PA-C, 1,000 mg at 06/03/24 9457   amLODipine  (NORVASC ) tablet 5 mg, 5 mg, Oral, Daily, Kinsinger, Herlene Righter, MD, 5 mg at 06/03/24 9096   calcium  gluconate 2 g/ 100 mL sodium chloride  IVPB, 2 g, Intravenous, Once, McClung, Sarah A, PA-C   Chlorhexidine  Gluconate Cloth 2 % PADS 6 each, 6 each, Topical, Daily, Danton Lauraine LABOR, PA-C, 6 each at 06/03/24 9095   docusate sodium  (COLACE) capsule 100 mg, 100 mg, Oral, BID, Danton Lauraine LABOR, PA-C, 100 mg at 06/03/24 9095   DULoxetine  (CYMBALTA ) DR capsule 20 mg, 20 mg, Oral, Daily, Danton Lauraine LABOR, PA-C, 20 mg at 06/03/24 9088   enoxaparin  (LOVENOX ) injection 30 mg, 30 mg, Subcutaneous, Q12H, Danton Lauraine LABOR, PA-C, 30 mg at 06/03/24 9095   gabapentin  (NEURONTIN ) capsule 100 mg, 100 mg, Oral, TID, Danton Lauraine LABOR, PA-C, 100 mg at 06/03/24 9096   hydrALAZINE  (APRESOLINE ) injection 10 mg, 10 mg, Intravenous, Q2H PRN, Danton Lauraine LABOR, PA-C, 10 mg at 06/03/24 9084   HYDROmorphone  (DILAUDID ) injection 0.5 mg, 0.5 mg, Intravenous, Q2H PRN, Danton Lauraine LABOR, PA-C,  0.5 mg at 05/30/24 9642   insulin  aspart (novoLOG ) injection 0-15 Units, 0-15 Units, Subcutaneous, TID WC, Danton Lauraine LABOR, PA-C, 3 Units at 06/03/24 9095   lidocaine  (LIDODERM ) 5 % 1 patch, 1 patch, Transdermal, Q24H, Danton Lauraine LABOR, PA-C, 1 patch at 06/03/24 9095   melatonin tablet 5 mg, 5 mg, Oral, QHS, McClung, Sarah A, PA-C, 5 mg at 06/02/24 2059   metoCLOPramide  (REGLAN ) tablet 5-10 mg, 5-10 mg, Oral, Q8H PRN **OR** metoCLOPramide  (REGLAN ) injection 5-10 mg, 5-10 mg, Intravenous, Q8H PRN, McClung, Sarah A, PA-C   metoprolol  tartrate (LOPRESSOR ) injection 5 mg, 5 mg, Intravenous, Q6H PRN, McClung, Sarah A, PA-C   ondansetron  (ZOFRAN -ODT) disintegrating tablet 4 mg, 4 mg, Oral, Q6H PRN **OR** ondansetron  (ZOFRAN ) injection 4 mg, 4 mg, Intravenous, Q6H PRN, McClung, Sarah A, PA-C   oxyCODONE  (Oxy IR/ROXICODONE ) immediate release tablet 5-10 mg, 5-10 mg, Oral, Q4H PRN, Danton Lauraine LABOR, PA-C, 10 mg at 06/03/24 0903   polyethylene glycol (MIRALAX  / GLYCOLAX ) packet 17 g, 17 g, Oral, Daily, Danton Lauraine LABOR, PA-C, 17 g at 06/03/24 9095   polyethylene glycol (MIRALAX  / GLYCOLAX ) packet 17 g, 17 g, Oral, Daily PRN, McClung, Sarah A, PA-C   sodium zirconium cyclosilicate  (LOKELMA ) packet 5 g, 5 g, Oral, Once, McClung, Sarah A, PA-C   Vitamin D  (Ergocalciferol ) (DRISDOL ) 1.25 MG (50000 UNIT) capsule 50,000 Units, 50,000 Units, Oral, Q7 days, Danton Lauraine LABOR, PA-C, 50,000 Units at 05/30/24 0555 [2]  Allergies Allergen Reactions   Codeine Anaphylaxis and Swelling    Eye swelling, puffiness  OK to take oxycodone /Percocet with no issues

## 2024-06-03 NOTE — Progress Notes (Addendum)
 PT Cancellation Note  Patient Details Name: TASHIBA TIMONEY MRN: [id number] DOB: 1980-12-10   Cancelled Treatment:    Reason Eval/Treat Not Completed: (P) Pain limiting ability to participate (Pt reports R shoulder is too painful to work on EOB/OOB scooting. Recent R shoulder xray taken this date appears [facility]. Pt able to perform AA shoulder IR/ER and PROM shoulder flexion without signficiant c/o pain but with active shoulder flexion and AB/Adduction, pt c/o sharp/burning pain around her R pec.) RN and Surgeon notified of pt complaint in case she needs further imaging of soft tissue structures. Pt would benefit from supervising PT reassessment next session, ice pack brought to room and placed on pt R shoulder.    Connell HERO Michael Walrath 06/03/2024, 4:30 PM

## 2024-06-03 NOTE — TOC Progression Note (Addendum)
 Transition of Care [facility]) - Progression Note    Patient Details  Name: Caitlyn [name] MRN: [id number] Date of Birth: 04-Jul-1980  Transition of Care [facility]) CM/SW Contact  [name] E Antoinette Borgwardt, LCSW Phone Number: 06/03/2024, 9:23 AM  Clinical Narrative:    CSW reached out to [facility] to confirm if patient was accepted to come there once medically ready.  9:53- Updated Psych note states patient does not meet criteria for inpatient psych hospitalization and recommends substance abuse treatment. CSW added SA resources to patient's AVS. Per PT, patient would still benefit from CIR - PT to see today for updated note - asked CIR Rep if they can reconsider patient.  Expected Discharge Plan: Psychiatric Hospital Barriers to Discharge: Continued Medical Work up               Expected Discharge Plan and Services   Discharge Planning Services: CM Consult   Living arrangements for the past 2 months: Apartment                                       Social Drivers of Health (SDOH) Interventions SDOH Screenings   Food Insecurity: No Food Insecurity (05/28/2024)  Housing: Low Risk (05/28/2024)  Transportation Needs: No Transportation Needs (05/28/2024)  Utilities: Not At Risk (05/28/2024)  Alcohol Screen: Low Risk (10/14/2022)  Depression (PHQ2-9): Low Risk (10/14/2022)  Financial Resource Strain: Medium Risk (10/14/2022)  Physical Activity: Unknown (10/14/2022)  Social Connections: Moderately Isolated (10/14/2022)  Stress: No Stress Concern Present (10/14/2022)  Tobacco Use: Medium Risk (05/29/2024)    Readmission Risk Interventions     No data to display

## 2024-06-03 NOTE — Plan of Care (Signed)

## 2024-06-03 NOTE — Progress Notes (Addendum)
 "  Progress Note  3 Days Post-Op  Subjective: Reports acute pain in the right shoulder after using right arm to pull herself up last night. She identifies the right shoulder as the source of pain, however a left shoulder XR was done last night and was negative. Feels the pain in her left leg is getting better.  Objective: Vital signs in last 24 hours: Temp:  [98.2 F (36.8 C)-98.5 F (36.9 C)] 98.5 F (36.9 C) (12/22 0911) Pulse Rate:  [85-100] 100 (12/22 0911) Resp:  [17-18] 18 (12/22 0911) BP: (151-182)/(87-99) 162/87 (12/22 1025) SpO2:  [93 %-100 %] 100 % (12/22 0911) Last BM Date : 06/01/24  Intake/Output from previous day: 12/21 0701 - 12/22 0700 In: 240 [P.O.:240] Out: -  Intake/Output this shift: No intake/output data recorded.  PE: General: sitting up in bed, NAD HEENT: head is normocephalic, atraumatic.  Lungs: Normal work of breathing on room air Abd: soft, nondistended MS: splint in place LLE, toes warm and well-perfused, sensation in tact Psych: A&Ox3 with an appropriate affect.   Lab Results:  Recent Labs    06/01/24 0653  WBC 10.6*  HGB 8.1*  HCT 24.7*  PLT 255   BMET Recent Labs    06/01/24 0653  NA 136  K 3.2*  CL 102  CO2 19*  GLUCOSE 144*  BUN 6  CREATININE 0.48  CALCIUM  8.1*   PT/INR No results for input(s): LABPROT, INR in the last 72 hours.  CMP     Component Value Date/Time   NA 136 06/01/2024 0653   NA 135 01/12/2023 1038   K 3.2 (L) 06/01/2024 0653   CL 102 06/01/2024 0653   CO2 19 (L) 06/01/2024 0653   GLUCOSE 144 (H) 06/01/2024 0653   GLUCOSE 107 (H) 09/09/2013 1237   BUN 6 06/01/2024 0653   BUN 9 01/12/2023 1038   CREATININE 0.48 06/01/2024 0653   CREATININE 0.62 02/03/2014 0931   CALCIUM  8.1 (L) 06/01/2024 0653   PROT 6.2 (L) 05/30/2024 0601   PROT 7.1 01/12/2023 1038   ALBUMIN  3.7 05/30/2024 0601   ALBUMIN  4.4 01/12/2023 1038   AST 65 (H) 05/30/2024 0601   ALT 30 05/30/2024 0601   ALKPHOS 53 05/30/2024  0601   BILITOT 0.5 05/30/2024 0601   BILITOT 0.3 01/12/2023 1038   GFRNONAA >60 06/01/2024 0653   GFRAA >60 07/02/2019 1311   Lipase     Component Value Date/Time   LIPASE 15 10/17/2008 0950       Studies/Results: DG Shoulder Left Port Result Date: 06/02/2024 CLINICAL DATA:  Left shoulder pain EXAM: LEFT SHOULDER COMPARISON:  05/27/2024 FINDINGS: No acute fracture or dislocation is noted in the left shoulder joint. No soft tissue abnormality is noted. Mildly displaced left fifth rib fracture is seen without pneumothorax similar to that noted on prior CT. IMPRESSION: No acute abnormality in the left shoulder. Left fifth rib fracture similar to prior CT examination. Electronically Signed   By: Oneil Devonshire M.D.   On: 06/02/2024 23:52     Assessment/Plan Jump out of window   Suspected suicide attempt - psych consulted, recommend 1:1 observation, now recommending outpatient psych follow up instead of inpatient admission. L rib fx 3/5-7 - pain control, pulm toilet  L open tib-fib fx - per Dr. Kendal, s/p ORIF L acetabulum, ex-fix L ankle 12/16, closed reduction and washout with VAC placement 12/17. S/p ORIF L tib-fib and L malleolus fx 12/19. NWB LLE.  Posterior L hip dislocation with associated acetabulum fx -  per Dr. Kendal, s/p ORIF 12/17 Acute R shoulder pain - XR pending ABL anemia - S/p 1U PRBC 12/18. Hgb stable at 8.1 this am. R toe pain - xray negative for acute fracture HTN - amlodipine  started  FEN - Regular diet, bowel regimen DVT - SCDs, LMWH ID - Periop ancef  completed Dispo - 6N, PT/OT.  Psych recommending outpatient follow up. Continue therapies, recommending CIR at discharge.    LOS: 7 days   I reviewed Consultant psychiatry, orthopedic surgery notes, last 24 h vitals and pain scores, last 48 h intake and output, last 24 h labs and trends, and last 24 h imaging results.  This care required straight-forward level of medical decision making.    Leonor LITTIE Dawn,  MD  [facility] 06/03/2024, 12:08 PM Please see Amion for pager number during day hours 7:00am-4:30pm  "

## 2024-06-03 NOTE — Progress Notes (Signed)
 Inpatient Rehab Admissions Coordinator:   Per [facility] request pt was screened for CIR by Reche Lowers, PT, DPT.  Note that pt and family have repeatedly denied SI and psych service has determined pt does not meet criteria for inpatient psych admission after hospitalization.  She continues to require significant assist for limited mobility with therapy (no OOB yet).  Would like to see how she does today and if she can transfer OOB, if able to transfer OOB without lift equipment would favor CIR assessment; otherwise I'm not sure she would be able to tolerate the intensity of a CIR program.   Reche Lowers, PT, DPT Admissions Coordinator [phone] 06/03/2024 10:33 AM

## 2024-06-04 DIAGNOSIS — W134XXA Fall from, out of or through window, initial encounter: Secondary | ICD-10-CM

## 2024-06-04 DIAGNOSIS — Z9189 Other specified personal risk factors, not elsewhere classified: Secondary | ICD-10-CM

## 2024-06-04 DIAGNOSIS — F10929 Alcohol use, unspecified with intoxication, unspecified: Secondary | ICD-10-CM

## 2024-06-04 LAB — GLUCOSE, CAPILLARY
Glucose-Capillary: 205 mg/dL — ABNORMAL HIGH (ref 70–99)
Glucose-Capillary: 185 mg/dL — ABNORMAL HIGH (ref 70–99)
Glucose-Capillary: 165 mg/dL — ABNORMAL HIGH (ref 70–99)
Glucose-Capillary: 176 mg/dL — ABNORMAL HIGH (ref 70–99)

## 2024-06-04 NOTE — Plan of Care (Signed)

## 2024-06-04 NOTE — Progress Notes (Signed)
 Physical Therapy Treatment Patient Details Name: Caitlyn [name] MRN: [id number] DOB: Jul 26, 1980 Today's Date: 06/04/2024   History of Present Illness 43 y.o. female admitted 05/27/24 after jumping from 3rd story window (suspected SI attempt vs ETOH abuse vs substance-induced mood disorder). Workup for L rib 3,5-7 fxs, L open tib-fib fx, and posterior hip dislocation with acetabulum fx. S/p L tibial ex fix with wound vac placement, L acetabular ORIF on 12/17. 12/19 removed external fixator with ORIF distal tib-fib and medical malleolus fractures PMH includes chronic R hip pain, anemia, DM, HTN, OSA, remote smoker.    PT Comments  Pt making excellent progress towards her physical therapy goals. She has continued R shoulder pain as noted yesterday; xray negative for acute fracture. Painful with active R shoulder flexion and horizontal adduction. She has been icing consistently since onset. She is fully willing to participate as tolerated and motivated to participate. After verbal and visual demonstration, pt able to perform slide board transfer to and from w/c with CGA-minA. Demonstrates good adherence to weightbearing precautions. Patient will benefit from intensive inpatient follow-up therapy, >3 hours/day to address further transfer training, wheelchair mobility, strengthening.   If plan is discharge home, recommend the following: A little help with walking and/or transfers;A lot of help with bathing/dressing/bathroom;Assistance with cooking/housework;Assist for transportation;Help with stairs or ramp for entrance   Can travel by private vehicle        Equipment Recommendations  Wheelchair (measurements PT);Wheelchair cushion (measurements PT) (drop arm BSC, sliding board)    Recommendations for Other Services       Precautions / Restrictions Precautions Precautions: Fall;Other (comment) Recall of Precautions/Restrictions: Intact Precaution/Restrictions Comments: L rib  fxs Restrictions Weight Bearing Restrictions Per Provider Order: Yes LLE Weight Bearing Per Provider Order: Non weight bearing     Mobility  Bed Mobility Overal bed mobility: Needs Assistance Bed Mobility: Supine to Sit, Sit to Supine     Supine to sit: Min assist Sit to supine: Min assist   General bed mobility comments: MinA for LLE management into and out of bed    Transfers Overall transfer level: Needs assistance Equipment used: Sliding board Transfers: Bed to chair/wheelchair/BSC            Lateral/Scoot Transfers: Min assist, Contact guard assist, +2 safety/equipment General transfer comment: Verbal and visual instruction for slide board transfer. Lateral lean onto elbow for PT/OT to place slide board. MinA for lateral scoot towards R, CGA for lateral scoot towards L back to bed. Cues for head/hip relationship    Ambulation/Gait                   Stairs             Wheelchair Mobility     Tilt Bed    Modified Rankin (Stroke Patients Only)       Balance Overall balance assessment: Needs assistance Sitting-balance support: Feet supported Sitting balance-Leahy Scale: Fair                                      Hotel Manager: No apparent difficulties  Cognition Arousal: Alert Behavior During Therapy: WFL for tasks assessed/performed   PT - Cognitive impairments: No apparent impairments                         Following commands: Intact      Cueing Cueing Techniques:  Verbal cues  Exercises      General Comments        Pertinent Vitals/Pain Pain Assessment Pain Assessment: Faces Faces Pain Scale: Hurts even more Pain Location: R shoulder Pain Descriptors / Indicators: Discomfort, Grimacing, Guarding, Sore Pain Intervention(s): Limited activity within patient's tolerance, Monitored during session, Ice applied    Home Living                          Prior  Function            PT Goals (current goals can now be found in the care plan section) Acute Rehab PT Goals Patient Stated Goal: decreased pain, regain strength, get back home Progress towards PT goals: Progressing toward goals    Frequency    Min 3X/week      PT Plan      Co-evaluation PT/OT/SLP Co-Evaluation/Treatment: Yes Reason for Co-Treatment: To address functional/ADL transfers;Other (comment) (previously max A+ 2, new shoulder pain)          AM-PAC PT 6 Clicks Mobility   Outcome Measure  Help needed turning from your back to your side while in a flat bed without using bedrails?: A Little Help needed moving from lying on your back to sitting on the side of a flat bed without using bedrails?: A Little Help needed moving to and from a bed to a chair (including a wheelchair)?: A Little Help needed standing up from a chair using your arms (e.g., wheelchair or bedside chair)?: A Lot Help needed to walk in hospital room?: Total Help needed climbing 3-5 steps with a railing? : Total 6 Click Score: 13    End of Session Equipment Utilized During Treatment: Gait belt Activity Tolerance: Patient tolerated treatment well Patient left: in bed;with call bell/phone within reach;with bed alarm set   PT Visit Diagnosis: Other abnormalities of gait and mobility (R26.89);Muscle weakness (generalized) (M62.81);Pain Pain - Right/Left: Left Pain - part of body: Leg     Time: 1425-1450 PT Time Calculation (min) (ACUTE ONLY): 25 min  Charges:    $Therapeutic Activity: 8-22 mins PT General Charges $$ ACUTE PT VISIT: 1 Visit                     Aleck Daring, PT, DPT Acute Rehabilitation Services Office [phone]    Aleck ONEIDA Daring 06/04/2024, 3:05 PM

## 2024-06-04 NOTE — Consult Note (Addendum)
 [facility] Psychiatry Face-to-Face Psychiatric Evaluation Follow Up   Service Date: June 04, 2024 LOS:  LOS: 8 days    Assessment  Caitlyn [name] is a 43 y.o. female admitted medically on 05/27/2024  7:06 PM for multiple fractures including left ribs, left hip and tib-fib fracture following a level 2 trauma after jumping from a third story window.  Patient carries no former psychiatric diagnoses.  She has a significant medical history consisting of diabetes mellitus, hypertension, hyperlipidemia, obstructive sleep apnea, chronic hip pain and GERD.  Psychiatry was consulted due to concern for suicide attempt.  Patient unable to recall specific events with her leading to falling out of the window. Patient stays on the 3rd floor and EMS suspects it was a SI attempt. Suspect patient is minimizing alcohol and could have some underlying depressive mood symptoms. She denies that it was SI attempt or any SI ideations previously or currently.  Patient's blood alcohol level was 186 upon admission, patient's husband also voiced concerns for patients drinking behavior and he suspects, she has been sneaking to drink. Husband, doesn't believe it was a suicide attempt, and states she has had memory issues previously before. Current differential includes alcohol abuse versus substance-induced mood disorder.  Patient does report some low mood, issues with sleep, feelings of hopelessness and inability to perform some task in the setting of ongoing hip, knee and back pain.  Patient reports her pain limits her ability to work, and enjoy certain aspects of her life as she once did. To address mood symptoms and pain will start Cymbalta .   Will also add on low-dose gabapentin  to assist with pain control and could be helpful if patient experiencing any anxiety.    Updated Assessment:  12/18 Went to assess patient at bedside, husband and sister were there for support.  Husband eventually left.  Patient continues to  deny any significant mood symptoms, and is tolerating Cymbalta  without difficulty.  Reports improvement with mood.  She continues to not understand, the purpose of her attempting to grab the latch on the wall window underneath the third story window.  She denies any suicidal ideations, homicidal ideation auditory visual hallucinations.  Patient states that she is scheduled to have another procedure done either Friday or Monday.  Will continue to follow along with psych to consider disposition planning of inpatient psychiatric treatment vs follow-up outpatient resources.   12/19: The plan remains unchanged.  Although she denies any active suicidal or homicidal ideations, we will continue with planning for inpatient psychiatric once she is medically stable.  Patient is currently in surgery.   12/20: Patient seen face to face in her hospital room with her sister and 1:1 staff at bedside. She is awake, alert and oriented x 4. Patient appears calm and cooperative and reports doing well today. Patient reports she slept well overnight and does not experience pain. She denies depressive symptoms, anxiety, nervousness, worry, psychosis, suicidal or homicidal ideation, intent or plan. She continues to deny suicidal intent for jumping out of the 3rd floor window.   12/22: Patient continues to strongly deny that this was a suicide attempt. Patient's husband also confirms that this was not a suicide attempt but there is concern of patient's alcohol use. Given the duration of the hospital stay and seeing improvement of symptoms while being started on psychotropic meds, we proceeded with safety planning with her husband, discussing 24/7 oversight until her outpatient followup appoint with psychiatry. I communicated this with patient's husband and he  plans on temporarily moving in to his mother's home and he agrees to closely monitor the patient and on the hours that he is unable to, his mother and 62 year old daughter  will be able to monitor her until her outpatient psychiatry appointment.   At this time, she does not meet criteria for inpatient psychiatric hospitalization as she is not an imminent danger to herself or others and she contracts for safety. Patient is tolerating Cymbalta  during this hospitalization. She would greatly benefit from substance use treatment and patient's husband is in agreement.   12/23: Patient remains pleasant and cooperative. She is open to going home once medically stable with the plan of close oversight with her family and going to [facility] or [facility]. I communicated this with the PHP and [facility] coordinators and they feel that she is appropriate for [facility] at this time with how the alcohol use has been negatively impacting her symptoms of depression and anxiety. They will be in contact with the patient. She is agreeable to continue taking her Cymbalta  and gabapentin . Continues to not be an imminent danger to herself.   Diagnoses:  Active Hospital problems: Principal Problem:   Pelvic fracture (HCC) Active Problems:   Type I or II open fracture of left ankle   Dislocation of hip, posterior, left, closed (HCC)   Open pilon fracture, left, type I or II, initial encounter   At risk for depression   Plan  ## Safety and Observation Level:  - Based on my clinical evaluation, I estimate the patient to be at low risk of self harm in the current setting - At this time, we recommend a routine level of observation. This decision is based on my review of the chart including patient's history and current presentation, interview of the patient, mental status examination, and consideration of suicide risk including evaluating suicidal ideation, plan, intent, suicidal or self-harm behaviors, risk factors, and protective factors. This judgment is based on our ability to directly address suicide risk, implement suicide prevention strategies and develop a safety plan while the patient is in the clinical  setting. Please contact our team if there is a concern that risk level has changed.  ## Medications:  -- Continue Cymbalta  20 mg daily for depression/neuropathic pain - Continue Gabapentin  100 mg 3 times daily for neuropathic pain - Continue Melatonin 5 mg at bedtime for sleep difficulties  ## Medical Decision Making Capacity:  Not specifically addressed  ## Further Work-up:  - TSH WNL, Vitamin B12 WNL, Folate WNL, Vitamin D  <6 -- most recent EKG on 12/15 f/u formal read -- Pertinent labwork reviewed earlier this admission includes: CBC with leukocytosis of 11.8, ethanol level of 186, hypokalemia of 5.3, lactic acid 6.3, UDS negative, AST at 116, ALT of 49, creatinine peaked at 1.40  ## Disposition:  -- Appropriate disposition is outpatient psychiatry with substance use management like [facility] or PHP  ## Behavioral / Environmental:  -- None  ##Legal Status No ongoing legal issues  Thank you for this consult request. Recommendations have been communicated to the primary team.  We will we will continue to follow at this time.   Ismael KATHEE Franco, MD   NEW history  Relevant Aspects of Hospital Course:  Admitted on 05/27/2024 as a level 2 trauma after jumping from a third story window. EMS thinks that the patient jumped intentionally. The patient is unwilling to provide a history as to why she jumped out of the window.  Orthopedics was consulted for a  left hip dislocation with an associated acetabulum fracture and a left open pilon fracture. She was admitted to the trauma service.  She underwent an irrigation and debridement of left open pilon fracture and external fixation with left hip closed reduction.  Patient Report:  12/23: Patient seen sitting in her bed, husband at bedside. Patient is feeling well. She denies SI, HI, and AVH. She agrees to go to either PHP or [facility] when she is medically stable. She understands why she is on Cymbalta  and gabapentin . She agrees to follow up with an  outpatient psychiatrist.   Collateral information taken previously:  Patient's husband was bedside and contributed to history, also discussed further collateral and the hallway  Husband denies that he thinks that incident that occurred at home was a suicide attempt.  He suspects that she was drinking and sent him to get the kids.  He says in the past he has suspected that her drinking has been worsening, however he always throws alcohol out of the house whenever he finds it.  Reports that she has had lapses in memory and difficulty with recall and prior instances.  He suspects that she needs help with her drinking.  Collateral information on 12/22: Spoke with husband on 12/22, he is concerned about her alcohol use and feels this is occurring almost daily. He denies that this situation was a suicide attempt and feels pt is appropriate to return home when she is medically stabilized.SABRA He wants to patient to get resources for substance use treatment and depression. I discussed the options of OP psychiatry and [facility]. I discussed that if the pt does become a safety concern and is an immident danger to herself or others that she can go to [facility] for urgent management. He also plans on moving in with his mother's home who lives in a home. He also will be able to monitor the patient very frequently and on the hours that he has to pick up the children, his eldest daughter who is 91 and his mother can monitor her.   Collateral information on 12/23: I confirmed with patient's mother in law that when patient is medically stable that her, her husband, and their children will temporarily move into her home until she attends her outpatient services.   Psychiatric History:  Information collected from patient Patient denies any prior psychiatric medication trials. Patient denies any current psychiatric provider or therapy utilized. No prior psychiatric hospitalizations, no prior history of suicidal ideations, attempts  or self injures behavior.  Family psych history: Son, ADHD No further psychiatric history disclosed  Social History:  Patient states that she grew up in [place]  with her mom, dad and 14 other siblings.  She reports that she graduated high school in [place]  and completed online.  She later moved to [place] with her mom.  She currently lives with her husband and 2 daughters and 2 sons.  Respective ages are 15 and 67, 31 and 73.  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  Patient been married to her current husband since 2020, however they have been dating since 2009.  She reports that she feels supported by her husband.  She denies any ongoing legal issues and does not possess any firearms  Tobacco use: Denies current use, remote use 20 years ago Alcohol use: Reports occasional drinking on the weekend 1-2  mixed drinks of vodka and pineapple juice Drug use: Denies any further current use  Family History:   The patient's family history is not on file.  Medical History: Past Medical History:  Diagnosis Date   Anemia    Diabetes mellitus without complication (HCC)    GM - Diet controlled - no meds   Hypertension    PIH with pregnancy   Sleep apnea    does not use CPAP - lost weight   SVD (spontaneous vaginal delivery)    x 2    Surgical History: Past Surgical History:  Procedure Laterality Date   CESAREAN SECTION  03/13/2010   CESAREAN SECTION WITH BILATERAL TUBAL LIGATION N/A 02/25/2014   Procedure: CESAREAN SECTION WITH BILATERAL TUBAL LIGATION;  Surgeon: Lang JINNY Peel, DO;  Location: [facility];  Service: Obstetrics;  Laterality: N/A;   EXTERNAL FIXATION LEG Left 05/27/2024   Procedure: EXTERNAL FIXATION, LOWER EXTREMITY;  Surgeon: Georgina Ozell LABOR, MD;  Location: [facility] OR;  Service: Orthopedics;  Laterality: Left;   EXTERNAL FIXATION, ANKLE Left  05/29/2024   Procedure: ADJUSTMENT EXTERNAL FIXATION, ANKLE;  Surgeon: Kendal Franky SQUIBB, MD;  Location: [facility] OR;  Service: Orthopedics;  Laterality: Left;   HIP CLOSED REDUCTION Left 05/27/2024   Procedure: CLOSED REDUCTION, HIP AND APPLICATION TRACTION WITH PIN PLACEMENT;  Surgeon: Georgina Ozell LABOR, MD;  Location: [facility] OR;  Service: Orthopedics;  Laterality: Left;   IRRIGATION AND DEBRIDEMENT POSTERIOR HIP Left 05/27/2024   Procedure: IRRIGATION AND DEBRIDEMENT, OPEN FRACTURE;  Surgeon: Georgina Ozell LABOR, MD;  Location: [facility] OR;  Service: Orthopedics;  Laterality: Left;   OPEN REDUCTION INTERNAL FIXATION (ORIF) TIBIA/FIBULA FRACTURE Left 05/31/2024   Procedure: OPEN REDUCTION INTERNAL FIXATION (ORIF) OF LEFT DISTAL TIBIA/FIBULA FRACTURE;  Surgeon: Kendal Franky SQUIBB, MD;  Location: [facility];  Service: Orthopedics;  Laterality: Left;   OPEN REDUCTION INTERNAL FIXATION ACETABULUM POSTERIOR LATERAL Left 05/29/2024   Procedure: OPEN REDUCTION INTERNAL FIXATION ACETABULUM POSTERIOR LATERAL;  Surgeon: Kendal Franky SQUIBB, MD;  Location: [facility];  Service: Orthopedics;  Laterality: Left;   TOOTH EXTRACTION      Medications:  Current Medications[1]  Allergies: Allergies[2]     Objective  Vital signs:  Temp:  [98.6 F (37 C)-98.8 F (37.1 C)] 98.8 F (37.1 C) (12/23 0700) Pulse Rate:  [90-107] 90 (12/23 0700) Resp:  [15-18] 15 (12/23 0700) BP: (112-146)/(68-103) 146/89 (12/23 0700) SpO2:  [98 %] 98 % (12/23 0700)  Psychiatric Specialty Exam:  Presentation  General Appearance: Appropriate for Environment; Fairly Groomed  Eye Contact:Good  Speech:Clear and Coherent; Normal Rate  Speech Volume:Normal  Handedness:Right   Mood and Affect  Mood:Euthymic  Affect:Congruent; Appropriate; Full Range   Thought Process  Thought Processes:Coherent; Goal Directed; Linear  Descriptions of Associations:Intact  Orientation:Full (Time, Place and Person)  Thought Content:Logical  History of  Schizophrenia/Schizoaffective disorder:no Duration of Psychotic Symptoms: na Hallucinations:Hallucinations: None    Ideas of Reference:None  Suicidal Thoughts:Suicidal Thoughts: No    Homicidal Thoughts:Homicidal Thoughts: No     Sensorium  Memory:Immediate Good; Recent Good; Remote Good  Judgment:Fair  Insight:Fair   Executive Functions  Concentration:Good  Attention Span:Good  Recall:Good  Fund of Knowledge:Good  Language:Good   Psychomotor Activity  Psychomotor Activity:Psychomotor Activity: Normal   Assets  Assets:Communication Skills; Desire for Improvement; Resilience  Sleep  Sleep:Sleep: Fair   Physical Exam: Physical Exam Review of Systems  Musculoskeletal:  Positive for back pain and joint pain.  Psychiatric/Behavioral:  Negative for depression, hallucinations, substance abuse and suicidal ideas. The patient  has insomnia. The patient is not nervous/anxious.    Blood pressure (!) 146/89, pulse 90, temperature 98.8 F (37.1 C), temperature source Oral, resp. rate 15, height 5' 4 (1.626 m), weight 103.4 kg, last menstrual period 05/20/2024, SpO2 98%. Body mass index is 39.14 kg/m.   Ismael Franco, MD PGY-3 Psychiatry Resident        [1]  Current Facility-Administered Medications:    acetaminophen  (TYLENOL ) tablet 1,000 mg, 1,000 mg, Oral, Q6H, Danton Lauraine LABOR, PA-C, 1,000 mg at 06/04/24 9388   amLODipine  (NORVASC ) tablet 5 mg, 5 mg, Oral, Daily, Kinsinger, Herlene Righter, MD, 5 mg at 06/04/24 0813   calcium  gluconate 2 g/ 100 mL sodium chloride  IVPB, 2 g, Intravenous, Once, McClung, Sarah A, PA-C   Chlorhexidine  Gluconate Cloth 2 % PADS 6 each, 6 each, Topical, Daily, Danton Lauraine LABOR, PA-C, 6 each at 06/04/24 9180   docusate sodium  (COLACE) capsule 100 mg, 100 mg, Oral, BID, Danton Lauraine LABOR, PA-C, 100 mg at 06/04/24 0813   DULoxetine  (CYMBALTA ) DR capsule 20 mg, 20 mg, Oral, Daily, Danton Lauraine LABOR, PA-C, 20 mg at 06/04/24 0815    enoxaparin  (LOVENOX ) injection 30 mg, 30 mg, Subcutaneous, Q12H, Danton Lauraine LABOR, PA-C, 30 mg at 06/04/24 9182   gabapentin  (NEURONTIN ) capsule 100 mg, 100 mg, Oral, TID, Danton Lauraine LABOR, PA-C, 100 mg at 06/04/24 9185   hydrALAZINE  (APRESOLINE ) injection 10 mg, 10 mg, Intravenous, Q2H PRN, Danton Lauraine LABOR, PA-C, 10 mg at 06/03/24 9084   HYDROmorphone  (DILAUDID ) injection 0.5 mg, 0.5 mg, Intravenous, Q2H PRN, Danton Lauraine LABOR, PA-C, 0.5 mg at 05/30/24 9642   insulin  aspart (novoLOG ) injection 0-15 Units, 0-15 Units, Subcutaneous, TID WC, Danton Lauraine LABOR, PA-C, 5 Units at 06/04/24 9180   lidocaine  (LIDODERM ) 5 % 1 patch, 1 patch, Transdermal, Q24H, Danton Lauraine LABOR, PA-C, 1 patch at 06/04/24 0817   melatonin tablet 5 mg, 5 mg, Oral, QHS, Danton Lauraine LABOR, PA-C, 5 mg at 06/03/24 2033   metoCLOPramide  (REGLAN ) tablet 5-10 mg, 5-10 mg, Oral, Q8H PRN **OR** metoCLOPramide  (REGLAN ) injection 5-10 mg, 5-10 mg, Intravenous, Q8H PRN, McClung, Sarah A, PA-C   metoprolol  tartrate (LOPRESSOR ) injection 5 mg, 5 mg, Intravenous, Q6H PRN, McClung, Sarah A, PA-C   ondansetron  (ZOFRAN -ODT) disintegrating tablet 4 mg, 4 mg, Oral, Q6H PRN **OR** ondansetron  (ZOFRAN ) injection 4 mg, 4 mg, Intravenous, Q6H PRN, McClung, Sarah A, PA-C   oxyCODONE  (Oxy IR/ROXICODONE ) immediate release tablet 5-10 mg, 5-10 mg, Oral, Q4H PRN, Danton Lauraine LABOR, PA-C, 10 mg at 06/04/24 0230   polyethylene glycol (MIRALAX  / GLYCOLAX ) packet 17 g, 17 g, Oral, Daily, Danton Lauraine LABOR, PA-C, 17 g at 06/04/24 0815   polyethylene glycol (MIRALAX  / GLYCOLAX ) packet 17 g, 17 g, Oral, Daily PRN, McClung, Sarah A, PA-C   sodium zirconium cyclosilicate  (LOKELMA ) packet 5 g, 5 g, Oral, Once, McClung, Sarah A, PA-C   Vitamin D  (Ergocalciferol ) (DRISDOL ) 1.25 MG (50000 UNIT) capsule 50,000 Units, 50,000 Units, Oral, Q7 days, Danton Lauraine LABOR, PA-C, 50,000 Units at 05/30/24 0555 [2]  Allergies Allergen Reactions   Codeine Anaphylaxis and Swelling     Eye swelling, puffiness  OK to take oxycodone /Percocet with no issues

## 2024-06-04 NOTE — Progress Notes (Signed)
 Occupational Therapy Treatment Patient Details Name: Caitlyn [name] MRN: [id number] DOB: 02-07-1981 Today's Date: 06/04/2024   History of present illness 43 y.o. female admitted 05/27/24 after jumping from 3rd story window (suspected SI attempt vs ETOH abuse vs substance-induced mood disorder). Workup for L rib 3,5-7 fxs, L open tib-fib fx, and posterior hip dislocation with acetabulum fx. S/p L tibial ex fix with wound vac placement, L acetabular ORIF on 12/17. 12/19 removed external fixator with ORIF distal tib-fib and medical malleolus fractures PMH includes chronic R hip pain, anemia, DM, HTN, OSA, remote smoker.   OT comments  Pt is progressing well towards OT goals. Focus of session on progressing functional mobility and increasing activity tolerance for independent engagement in ADL tasks OOB. Pt required up to Min A for lateral scoot to drop arm w/c while adhering to NWB LLE. Pt with improved mobility back to bed, transitioning to CGA. Pt with new RUE shoulder pain deficits observed as noted below. Pt continues to benefit from acute OT services to facilitate progress towards goals. Patient will benefit from intensive inpatient follow-up therapy, >3 hours/day with goal of safe return home.       If plan is discharge home, recommend the following:  A lot of help with bathing/dressing/bathroom;Assistance with cooking/housework;Assist for transportation;Help with stairs or ramp for entrance;A lot of help with walking and/or transfers   Equipment Recommendations  BSC/3in1;Wheelchair (measurements OT);Wheelchair cushion (measurements OT);Other (comment)    Recommendations for Other Services Rehab consult    Precautions / Restrictions Precautions Precautions: Fall;Other (comment) Recall of Precautions/Restrictions: Intact Precaution/Restrictions Comments: L rib fxs Restrictions Weight Bearing Restrictions Per Provider Order: Yes LLE Weight Bearing Per Provider Order: Non weight bearing        Mobility Bed Mobility Overal bed mobility: Needs Assistance Bed Mobility: Supine to Sit, Sit to Supine     Supine to sit: Min assist, HOB elevated, Used rails Sit to supine: Min assist, Used rails   General bed mobility comments: Min A management of LLE for supine <-> sit transfers. Pt required brief posterior Min A for trunk elevation but able to recover and maintain sitting balance with supervision.    Transfers Overall transfer level: Needs assistance Equipment used: Sliding board Transfers: Bed to chair/wheelchair/BSC            Lateral/Scoot Transfers: Min assist, Contact guard assist, +2 safety/equipment General transfer comment: Verbal and visual instruction for slide board transfer. Lateral lean onto elbow for PT/OT to place slide board. MinA for lateral scoot towards R, CGA for lateral scoot towards L back to bed. Cues for head/hip relationship     Balance Overall balance assessment: Needs assistance Sitting-balance support: Feet supported Sitting balance-Leahy Scale: Fair                                     ADL either performed or assessed with clinical judgement   ADL Overall ADL's : Needs assistance/impaired                 Upper Body Dressing : Minimal assistance;Sitting                     General ADL Comments: Pt with improved sitting balance this date, focus of future sessions on progessing ADL engagement OOB.    Extremity/Trunk Assessment Upper Extremity Assessment Upper Extremity Assessment: Generalized weakness;RUE deficits/detail RUE Deficits / Details: Increased shoulder pain with ROM.  Shoulder ROM deficits noted in shoulder flexion (~70 degrees), internal rotation (>45 degrees), abduction (~45 degrees). Scapular ROM deficits and pain observed with elevation and upward rotation. RUE: Shoulder pain with ROM RUE Sensation: WNL RUE Coordination: decreased gross motor            Vision   Vision  Assessment?: No apparent visual deficits   Perception     Praxis     Communication Communication Communication: No apparent difficulties   Cognition Arousal: Alert Behavior During Therapy: WFL for tasks assessed/performed Cognition: No apparent impairments                               Following commands: Intact        Cueing   Cueing Techniques: Verbal cues, Visual cues  Exercises      Shoulder Instructions       General Comments VSS on RA. Educated Pt on POC and rehabilitative process    Pertinent Vitals/ Pain       Pain Assessment Pain Assessment: Faces Faces Pain Scale: Hurts whole lot Pain Location: R shoulder, LLE Pain Descriptors / Indicators: Grimacing, Guarding, Moaning, Sharp, Tightness Pain Intervention(s): Limited activity within patient's tolerance, Monitored during session, Repositioned, Ice applied  Home Living                                          Prior Functioning/Environment              Frequency  Min 2X/week        Progress Toward Goals  OT Goals(current goals can now be found in the care plan section)  Progress towards OT goals: Progressing toward goals  Acute Rehab OT Goals Patient Stated Goal: get better OT Goal Formulation: With patient Time For Goal Achievement: 06/13/24 Potential to Achieve Goals: Good ADL Goals Pt Will Perform Grooming: with contact guard assist;with supervision;sitting Pt Will Perform Upper Body Bathing: with min assist;with contact guard assist;sitting Pt Will Perform Lower Body Bathing: with mod assist;sitting/lateral leans;with adaptive equipment Pt Will Perform Upper Body Dressing: with min assist;with contact guard assist;sitting Pt Will Transfer to Toilet: with modified independence;with transfer board;bedside commode Pt/caregiver will Perform Home Exercise Program: Increased ROM;Right Upper extremity;With written HEP provided  Plan      Co-evaluation     PT/OT/SLP Co-Evaluation/Treatment: Yes Reason for Co-Treatment: To address functional/ADL transfers;Other (comment) (previously max A+ 2, new shoulder pain)   OT goals addressed during session: Proper use of Adaptive equipment and DME;Strengthening/ROM      AM-PAC OT 6 Clicks Daily Activity     Outcome Measure   Help from another person eating meals?: None Help from another person taking care of personal grooming?: A Little Help from another person toileting, which includes using toliet, bedpan, or urinal?: A Lot Help from another person bathing (including washing, rinsing, drying)?: A Lot Help from another person to put on and taking off regular upper body clothing?: A Little Help from another person to put on and taking off regular lower body clothing?: A Lot 6 Click Score: 16    End of Session Equipment Utilized During Treatment: Gait belt;Other (comment) (sliding board)  OT Visit Diagnosis: Other abnormalities of gait and mobility (R26.89);Muscle weakness (generalized) (M62.81);Pain Pain - part of body: Arm;Leg;Ankle and joints of foot   Activity Tolerance Patient tolerated treatment well  Patient Left in bed;with call bell/phone within reach   Nurse Communication Mobility status        Time: 8571-8542 OT Time Calculation (min): 29 min  Charges: OT General Charges $OT Visit: 1 Visit OT Treatments $Therapeutic Activity: 8-22 mins  Maurilio CROME, OTR/L.  [facility] Acute Rehabilitation  Office: [phone]   Maurilio PARAS Masa Lubin 06/04/2024, 4:20 PM

## 2024-06-04 NOTE — Progress Notes (Signed)
 Inpatient Rehab Admissions Coordinator:   At this time we are recommending a CIR consult and I will place an order per our protocol.  Reche Lowers, PT, DPT Admissions Coordinator [phone] 06/04/2024 3:29 PM

## 2024-06-04 NOTE — Progress Notes (Signed)
 "  Progress Note  4 Days Post-Op  Subjective: Husband at bedside.  Patient reports continued right shoulder pain that began after using her right arm to pull herself up 12/21. Xray was negative.  Tolerating regular diet. Last BM was 12/21. Reports flatulence. Denies n/v, SOB, CP.  ROS  All negative with the exception of above.  Objective: Vital signs in last 24 hours: Temp:  [98.6 F (37 C)-98.8 F (37.1 C)] 98.8 F (37.1 C) (12/23 0700) Pulse Rate:  [90-107] 90 (12/23 0700) Resp:  [15-18] 15 (12/23 0700) BP: (112-146)/(68-103) 146/89 (12/23 0700) SpO2:  [98 %] 98 % (12/23 0700) Last BM Date : 06/01/24  Intake/Output from previous day: 12/22 0701 - 12/23 0700 In: 360 [P.O.:360] Out: -  Intake/Output this shift: Total I/O In: 720 [P.O.:720] Out: -   PE: General: Pleasant female who is laying in bed in NAD. HEENT: Head is normocephalic, atraumatic. Heart: HR normal during encounter. Palpable radial pulses bilaterally. Palpable pedal pulse of right lower extremity (left extremity in split). Lungs: Respiratory effort nonlabored on room air.  Abd: Soft, NT, ND, +BS. No rebound tenderness or guarding.  MS: Splint present of LLE. Toes warm and well-perfused. Able to mobilize toes and sensation is intact. Limited mobility of right upper extremity due to pain. Able to move all other extremities. Psych: A&Ox3 with an appropriate affect.    Lab Results:  No results for input(s): WBC, HGB, HCT, PLT in the last 72 hours. BMET No results for input(s): NA, K, CL, CO2, GLUCOSE, BUN, CREATININE, CALCIUM  in the last 72 hours. PT/INR No results for input(s): LABPROT, INR in the last 72 hours. CMP     Component Value Date/Time   NA 136 06/01/2024 0653   NA 135 01/12/2023 1038   K 3.2 (L) 06/01/2024 0653   CL 102 06/01/2024 0653   CO2 19 (L) 06/01/2024 0653   GLUCOSE 144 (H) 06/01/2024 0653   GLUCOSE 107 (H) 09/09/2013 1237   BUN 6 06/01/2024 0653    BUN 9 01/12/2023 1038   CREATININE 0.48 06/01/2024 0653   CREATININE 0.62 02/03/2014 0931   CALCIUM  8.1 (L) 06/01/2024 0653   PROT 6.2 (L) 05/30/2024 0601   PROT 7.1 01/12/2023 1038   ALBUMIN  3.7 05/30/2024 0601   ALBUMIN  4.4 01/12/2023 1038   AST 65 (H) 05/30/2024 0601   ALT 30 05/30/2024 0601   ALKPHOS 53 05/30/2024 0601   BILITOT 0.5 05/30/2024 0601   BILITOT 0.3 01/12/2023 1038   GFRNONAA >60 06/01/2024 0653   GFRAA >60 07/02/2019 1311   Lipase     Component Value Date/Time   LIPASE 15 10/17/2008 0950       Studies/Results: DG Shoulder Right Port Result Date: 06/03/2024 EXAM: 1 VIEW(S) XRAY OF THE RIGHT SHOULDER 06/03/2024 01:04:00 PM COMPARISON: None available. CLINICAL HISTORY: Right shoulder pain FINDINGS: BONES AND JOINTS: Glenohumeral joint is normally aligned. No acute fracture. No malalignment. The [facility] joint is unremarkable. SOFT TISSUES: No abnormal calcifications. Visualized lung is unremarkable. IMPRESSION: 1. No acute findings. Electronically signed by: Selinda Blue MD 06/03/2024 01:53 PM EST RP Workstation: [id number]   DG Shoulder Left Port Result Date: 06/02/2024 CLINICAL DATA:  Left shoulder pain EXAM: LEFT SHOULDER COMPARISON:  05/27/2024 FINDINGS: No acute fracture or dislocation is noted in the left shoulder joint. No soft tissue abnormality is noted. Mildly displaced left fifth rib fracture is seen without pneumothorax similar to that noted on prior CT. IMPRESSION: No acute abnormality in the left shoulder. Left fifth rib  fracture similar to prior CT examination. Electronically Signed   By: Oneil Devonshire M.D.   On: 06/02/2024 23:52    Anti-infectives: Anti-infectives (From admission, onward)    Start     Dose/Rate Route Frequency Ordered Stop   05/31/24 2000  ceFAZolin  (ANCEF ) IVPB 2g/100 mL premix        2 g 200 mL/hr over 30 Minutes Intravenous Every 8 hours 05/31/24 1612 06/01/24 1443   05/31/24 1114  vancomycin  (VANCOCIN ) powder  Status:   Discontinued          As needed 05/31/24 1116 05/31/24 1353   05/31/24 1041  ceFAZolin  (ANCEF ) 2-4 GM/100ML-% IVPB       Note to Pharmacy: Ezequiel Henri: cabinet override      05/31/24 1041 05/31/24 2244   05/29/24 2200  ceFAZolin  (ANCEF ) IVPB 2g/100 mL premix        2 g 200 mL/hr over 30 Minutes Intravenous Every 8 hours 05/29/24 1818 05/30/24 1529   05/29/24 1116  vancomycin  (VANCOCIN ) powder  Status:  Discontinued          As needed 05/29/24 1117 05/29/24 1314   05/28/24 0600  cefTRIAXone  (ROCEPHIN ) 1 g in sodium chloride  0.9 % 100 mL IVPB        1 g 200 mL/hr over 30 Minutes Intravenous Every 24 hours 05/28/24 0450 05/29/24 0650   05/27/24 2256  vancomycin  (VANCOCIN ) powder  Status:  Discontinued          As needed 05/27/24 2257 05/28/24 0017   05/27/24 1915  ceFAZolin  (ANCEF ) IVPB 2g/100 mL premix        2 g 200 mL/hr over 30 Minutes Intravenous STAT 05/27/24 1911 05/27/24 2000        Assessment/Plan Jump out of window   Suspected suicide attempt - psych consulted, recommend 1:1 observation, now recommending outpatient psych follow up instead of inpatient admission. L rib fx 3/5-7 - pain control, pulm toilet  L open tib-fib fx - per Dr. Kendal, s/p ORIF L acetabulum, ex-fix L ankle 12/16, closed reduction and washout with VAC placement 12/17. S/p ORIF L tib-fib and L malleolus fx 12/19. NWB LLE.  Posterior L hip dislocation with associated acetabulum fx - per Dr. Kendal, s/p ORIF 12/17. Acute R shoulder pain - XR 12/22 showed no acute findings. Ortho has been made aware of right shoulder pain. They plan to further assess as outpatient. Discussed with attending as well. ABL anemia - S/p 1U PRBC 12/18. Hgb stable 12/20 at 8.1 R toe pain - xray negative for acute fracture HTN - amlodipine  started   FEN - Regular diet, bowel regimen DVT - SCDs, LMWH ID - Periop ancef  completed Dispo - 6N, PT/OT.  Psych recommending outpatient follow up. Continue therapies, recommending  CIR at discharge.   LOS: 8 days   I reviewed specialist notes, consulting provider notes, other physician notes, nursing notes, last 24 h vitals and pain scores, last 48 h intake and output, last 24 h labs and trends, and last 24 h imaging results.  This care required moderate level of medical decision making.    Marjorie Carlyon Favre, [facility] 06/04/2024, 12:24 PM Please see Amion for pager number during day hours 7:00am-4:30pm  "

## 2024-06-05 LAB — GLUCOSE, CAPILLARY
Glucose-Capillary: 172 mg/dL — ABNORMAL HIGH (ref 70–99)
Glucose-Capillary: 135 mg/dL — ABNORMAL HIGH (ref 70–99)
Glucose-Capillary: 173 mg/dL — ABNORMAL HIGH (ref 70–99)
Glucose-Capillary: 214 mg/dL — ABNORMAL HIGH (ref 70–99)

## 2024-06-05 NOTE — Plan of Care (Signed)
   Problem: Education: Goal: Ability to describe self-care measures that may prevent or decrease complications (Diabetes Survival Skills Education) will improve Outcome: Progressing

## 2024-06-05 NOTE — PMR Pre-admission (Signed)
 PMR Admission Coordinator Pre-Admission Assessment  Patient: Caitlyn [name] is an 43 y.o., female MRN: [id number] DOB: Oct 21, 1980 Height: 5' 4 (162.6 cm) Weight: 103.4 kg  Insurance Information HMO: Yes    PPO:       PCP:       IPA:       80/20:       OTHER:  [place] [id number] PRIMARY: [employer] QHP      Policy#: [id number]      Subscriber: self CM Name: TBD      Phone#: TBD     Fax#: [phone] Pre-Cert#: [id number] Received approval on 06/07/24. Pt approved from 06/06/24-06/12/24. Pt approved for 7 days.     Employer: Not currently working Benefits:  Phone #: [phone]     Name: Verified on [email or web address] on line Eff. Date: 11/12/23 - 06/12/24     Deduct: $4304 (met $3615.96)      Out of Pocket Max: [phone] (met $3615.96)      Life Max: n/a CIR: 50%      SNF: 50% limited to 90 days/year Outpatient: 50%     Co-Pay: 50% Home Health: 50%      Co-Pay: 50% DME: 50%     Co-Pay: 50% Providers: in network  SECONDARY:       Policy#:      Phone#:   Artist:       Phone#:   The Best Boy for patients in Inpatient Rehabilitation Facilities with attached Privacy Act Statement-Health Care Records was provided and verbally reviewed with: Patient  Emergency Contact Information Contact Information     Name Relation Home Work Mobile   [place] Significant other [phone]  [phone]      Other Contacts   None on File     Current Medical History  Patient Admitting Diagnosis: Multiple trauma  History of Present Illness: A 43 yo female presented to [facility] on [facility]/27/24 to the ED department s/p jump from third story window, reportedly intentionally. Patient reports being amnestic to the event and needs prompting to remember anything from yesterday. Husband at bedside reports this occurred after they had an argument.  Workup for L rib 3,5-7 fxs, L open tib-fib fx, and posterior hip dislocation with acetabulum fx. S/p L  tibial ex fix with wound vac placement, L acetabular ORIF on 05/29/24. On 05/31/24 removed external fixator with ORIF distal tib-fib and medical malleolus fractures.  Psychiatry has seen patient and followed and patient has been cleared from suicidal attempt.  PT/OT evaluations completed with recommendations for acute inpatient rehab admission.  Patient's medical record from [facility] has been reviewed by the rehabilitation admission coordinator and physician.  Past Medical History  Past Medical History:  Diagnosis Date   Anemia    Diabetes mellitus without complication (HCC)    GM - Diet controlled - no meds   Hypertension    PIH with pregnancy   Sleep apnea    does not use CPAP - lost weight   SVD (spontaneous vaginal delivery)    x 2    Has the patient had major surgery during 100 days prior to admission? Yes  Family History   family history is not on file.  Current Medications Current Medications[1]  Patients Current Diet:  Diet Order             Diet Carb Modified Room service appropriate? Yes  Diet effective now  Precautions / Restrictions Precautions Precautions: Fall, Other (comment) Precaution/Restrictions Comments: L rib fxs Restrictions Weight Bearing Restrictions Per Provider Order: Yes LLE Weight Bearing Per Provider Order: Non weight bearing   Has the patient had 2 or more falls or a fall with injury in the past year? Yes  Prior Activity Level Household: Has been homebound due to issues with right hip  Prior Functional Level Self Care: Did the patient need help bathing, dressing, using the toilet or eating? Independent  Indoor Mobility: Did the patient need assistance with walking from room to room (with or without device)? Independent  Stairs: Did the patient need assistance with internal or external stairs (with or without device)? Independent  Functional Cognition: Did the patient need help planning regular tasks  such as shopping or remembering to take medications? Independent  Patient Information Are you of Hispanic, Latino/a,or Spanish origin?: A. No, not of Hispanic, Latino/a, or Spanish origin What is your race?: B. Black or African American Do you need or want an interpreter to communicate with a doctor or health care staff?: 0. No  Patient's Response To:  Health Literacy and Transportation Is the patient able to respond to health literacy and transportation needs?: Yes Health Literacy - How often do you need to have someone help you when you read instructions, pamphlets, or other written material from your doctor or pharmacy?: Never In the past 12 months, has lack of transportation kept you from medical appointments or from getting medications?: No In the past 12 months, has lack of transportation kept you from meetings, work, or from getting things needed for daily living?: No  Home Assistive Devices / Equipment Home Equipment: [place] - single point  Prior Device Use: Indicate devices/aids used by the patient prior to current illness, exacerbation or injury? Cane  Current Functional Level Cognition  Arousal/Alertness: Awake/alert Overall Cognitive Status: Within Functional Limits for tasks assessed Orientation Level: Oriented X4 Attention: Alternating Alternating Attention: Appears intact Memory: Appears intact Awareness: Appears intact Problem Solving: Appears intact Safety/Judgment: Appears intact    Extremity Assessment (includes Sensation/Coordination)  Upper Extremity Assessment: Generalized weakness, RUE deficits/detail RUE Deficits / Details: Increased shoulder pain with ROM.  Shoulder ROM deficits noted in shoulder flexion (~70 degrees), internal rotation (>45 degrees), abduction (~45 degrees). Scapular ROM deficits and pain observed with elevation and upward rotation. RUE: Shoulder pain with ROM RUE Sensation: WNL RUE Coordination: decreased gross motor  Lower Extremity  Assessment: Defer to PT evaluation RLE Deficits / Details: h/o R hip injury (pt reports recently dx with hip tears) and R hip/knee buckling, painful; gross knee strength >3/5, gross hip strength >/ 3-/5 LLE Deficits / Details: s/p L acetabular ORIF and L tibial ex-fix with expected post-op pain and weakness; gross hip strength < 3/5, able to fully extend L knee sitting EOB, able to wiggle toes LLE: Unable to fully assess due to immobilization    ADLs  Overall ADL's : Needs assistance/impaired Eating/Feeding: Independent, Sitting Grooming: Set up, Sitting Upper Body Bathing: Minimal assistance, Bed level Lower Body Bathing: Total assistance, Bed level Upper Body Dressing : Minimal assistance, Sitting Lower Body Dressing: Total assistance, Bed level Toilet Transfer Details (indicate cue type and reason): Will need +2 A, lateral scoot to drop arm BSC Toileting- Clothing Manipulation and Hygiene: Total assistance Toileting - Clothing Manipulation Details (indicate cue type and reason): catheter General ADL Comments: Pt with improved sitting balance this date, focus of future sessions on progessing ADL engagement OOB.    Mobility  Overal  bed mobility: Needs Assistance Bed Mobility: Supine to Sit Rolling: Max assist Supine to sit: Contact guard Sit to supine: Min assist General bed mobility comments: CGA for safety, pt using bed rail and HOB elevated to assist.    Transfers  Overall transfer level: Needs assistance Equipment used: None, Sliding board Transfers: Bed to chair/wheelchair/BSC Bed to/from chair/wheelchair/BSC transfer type:: Squat pivot, Lateral/scoot transfer Squat pivot transfers: Min assist  Lateral/Scoot Transfers: Min assist, With slide board General transfer comment: Squat pivot from elevated bed>wheelchair on her R, then from [facility] to drop arm recliner pt using slide board for safety due to increased space between surfaces and minA each transfer to ensure LLE stays off  floor and for safety/stability. Min cues for UE placement and technique as well as checking brakes prior to performing transfers.    Ambulation / Gait / Stairs / Wheelchair Mobility  Ambulation/Gait General Gait Details: pt reports not feeling comfortable to attempt stand pivot transfer with RW yet. Naval Architect mobility: Yes Wheelchair propulsion: Both upper extremities Wheelchair parts: Needs assistance Distance: 150 (including brief seated rest breaks) Wheelchair Assistance Details (indicate cue type and reason): HR to ~124 bpm with exertion, SpO2 98% on RA. Pt needing initial cues for use of brakes and safe technique to propel chair and turn. Pt needs frequent reminders to maintain chair in middle of hallway as she tends to veer to R side of hallway and frequent minA to maintain steering in correct direction and avoid obstacles, esp in narrower areas. Increased time to perform.    Posture / Balance Dynamic Sitting Balance Sitting balance - Comments: tolerates sitting EOB with and without UE support Balance Overall balance assessment: Needs assistance Sitting-balance support: Feet supported Sitting balance-Leahy Scale: Fair Sitting balance - Comments: tolerates sitting EOB with and without UE support Standing balance comment: pt defers to attempt standing to RW    Special considerations/life events  Skin Post op left surgical hip incision with dressing, Diabetic management Hgb A1C 7.7 and is a diabetic, Behavioral consideration Suicide attempt versus fall with psychiatry following   Previous Home Environment (from acute therapy documentation) Living Arrangements: Spouse/significant other, Children  Lives With: Spouse, Family Available Help at Discharge: Family, Available PRN/intermittently Type of Home: Apartment Home Layout: Multi-level Home Access: Level entry Bathroom Shower/Tub: Engineer, Manufacturing Systems: Standard Home Care Services: No Additional  Comments: lives on 3rd floor apartment. lives with husband and 3 kids (10, 20 & 12 y.o.; also has 69 y.o.)  Discharge Living Setting Plans for Discharge Living Setting: House, Lives with (comment) (Plans to go home to MIL home) Type of Home at Discharge: House Discharge Home Layout: One level Discharge Home Access: Ramped entrance Discharge Bathroom Shower/Tub: Tub/shower unit, Curtain Discharge Bathroom Toilet: Standard Discharge Bathroom Accessibility: Yes How Accessible: Accessible via walker Does the patient have any problems obtaining your medications?: No  Social/Family/Support Systems Patient Roles: Spouse, Parent (Has husband, 9 yo so, 1 yo dtr and 76 yo son) Contact Information: Darold Minerva - husband Anticipated Caregiver: Darold - husband Anticipated Caregiver's Contact Information: [phone] Ability/Limitations of Caregiver: Husband works but PEPSICO is available and can assist and provide supervision Caregiver Availability: 24/7 Discharge Plan Discussed with Primary Caregiver: Yes Is Caregiver In Agreement with Plan?: Yes Does Caregiver/Family have Issues with Lodging/Transportation while Pt is in Rehab?: No  Goals Patient/Family Goal for Rehab: PT/OT supervision goals Expected length of stay: 10-12 days Pt/Family Agrees to Admission and willing to participate: Yes Program Orientation Provided & Reviewed with  Pt/Caregiver Including Roles  & Responsibilities: Yes  Decrease burden of Care through IP rehab admission: N/A  Possible need for SNF placement upon discharge: Not anticipated  Patient Condition: I have reviewed medical records from [facility], spoken with CSW, and patient. I met with patient at the bedside for inpatient rehabilitation assessment.  Patient will benefit from ongoing PT and OT, can actively participate in 3 hours of therapy a day 5 days of the week, and can make measurable gains during the admission.  Patient will also benefit from the  coordinated team approach during an Inpatient Acute Rehabilitation admission.  The patient will receive intensive therapy as well as Rehabilitation physician, nursing, social worker, and care management interventions.  Due to bladder management, bowel management, safety, skin/wound care, disease management, medication administration, pain management, and patient education the patient requires 24 hour a day rehabilitation nursing.  The patient is currently Min A with mobility and basic ADLs.  Discharge setting and therapy post discharge at home with home health is anticipated.  Patient has agreed to participate in the Acute Inpatient Rehabilitation Program and will admit today.  Preadmission Screen Completed By:  Lovett CHRISTELLA Ropes, 06/08/2024 9:45 AM ______________________________________________________________________   Discussed status with Dr. Tomika Eckles on 06/08/2024  at 9:45 AM and received approval for admission today.  Admission Coordinator:  Lovett CHRISTELLA Ropes, RN, time  9:45 AM/Date 06/08/2024    Assessment/Plan: Diagnosis: Multi trauma due to jumping out of window Does the need for close, 24 hr/day Medical supervision in concert with the patient's rehab needs make it unreasonable for this patient to be served in a less intensive setting? Yes Co-Morbidities requiring supervision/potential complications: L open tib fib fx s/p ORIF, L acetabulum and L malleolar fx s/p ORIF- NWB LLE- R shulder pain- L rib fx's- DM, HTN, OSA, morbid obesity; constipation Due to bladder management, bowel management, safety, skin/wound care, disease management, medication administration, pain management, and patient education, does the patient require 24 hr/day rehab nursing? Yes Does the patient require coordinated care of a physician, rehab nurse, PT, OT to address physical and functional deficits in the context of the above medical diagnosis(es)? Yes Addressing deficits in the following areas: balance, endurance,  locomotion, strength, transferring, bowel/bladder control, bathing, dressing, feeding, grooming, and toileting Can the patient actively participate in an intensive therapy program of at least 3 hrs of therapy 5 days a week? Yes The potential for patient to make measurable gains while on inpatient rehab is good Anticipated functional outcomes upon discharge from inpatient rehab: supervision PT, supervision OT, n/a SLP Estimated rehab length of stay to reach the above functional goals is: 10-12 days Anticipated discharge destination: Home 10. Overall Rehab/Functional Prognosis: good   MD Signature:      [1]  Current Facility-Administered Medications:    acetaminophen  (TYLENOL ) tablet 1,000 mg, 1,000 mg, Oral, Q6H, McClung, Sarah A, PA-C, 1,000 mg at 06/08/24 9379   amLODipine  (NORVASC ) tablet 5 mg, 5 mg, Oral, Daily, Kinsinger, Herlene Righter, MD, 5 mg at 06/08/24 0930   calcium  gluconate 2 g/ 100 mL sodium chloride  IVPB, 2 g, Intravenous, Once, McClung, Sarah A, PA-C   Chlorhexidine  Gluconate Cloth 2 % PADS 6 each, 6 each, Topical, Daily, Danton Lauraine LABOR, PA-C, 6 each at 06/06/24 9086   docusate sodium  (COLACE) capsule 100 mg, 100 mg, Oral, BID, Danton Lauraine LABOR, PA-C, 100 mg at 06/08/24 9066   DULoxetine  (CYMBALTA ) DR capsule 20 mg, 20 mg, Oral, Daily, McClung, Sarah A, PA-C, 20 mg at  06/08/24 0933   enoxaparin  (LOVENOX ) injection 30 mg, 30 mg, Subcutaneous, Q12H, Danton Lauraine LABOR, PA-C, 30 mg at 06/08/24 9065   gabapentin  (NEURONTIN ) capsule 100 mg, 100 mg, Oral, TID, Danton Lauraine LABOR, PA-C, 100 mg at 06/08/24 0930   hydrALAZINE  (APRESOLINE ) injection 10 mg, 10 mg, Intravenous, Q2H PRN, Danton Lauraine LABOR, PA-C, 10 mg at 06/03/24 0915   HYDROmorphone  (DILAUDID ) injection 0.5 mg, 0.5 mg, Intravenous, Q2H PRN, Danton Lauraine LABOR, PA-C, 0.5 mg at 06/04/24 2215   insulin  aspart (novoLOG ) injection 0-15 Units, 0-15 Units, Subcutaneous, TID WC, Danton Lauraine LABOR, PA-C, 5 Units at 06/08/24 9063    lidocaine  (LIDODERM ) 5 % 1 patch, 1 patch, Transdermal, Q24H, Danton Lauraine LABOR, PA-C, 1 patch at 06/08/24 0930   melatonin tablet 5 mg, 5 mg, Oral, QHS, McClung, Sarah A, PA-C, 5 mg at 06/07/24 2146   methocarbamol  (ROBAXIN ) tablet 500 mg, 500 mg, Oral, Q6H PRN, Danton Lauraine LABOR, PA-C, 500 mg at 06/08/24 9378   metoCLOPramide  (REGLAN ) tablet 5-10 mg, 5-10 mg, Oral, Q8H PRN **OR** metoCLOPramide  (REGLAN ) injection 5-10 mg, 5-10 mg, Intravenous, Q8H PRN, McClung, Sarah A, PA-C   metoprolol  tartrate (LOPRESSOR ) injection 5 mg, 5 mg, Intravenous, Q6H PRN, McClung, Sarah A, PA-C   ondansetron  (ZOFRAN -ODT) disintegrating tablet 4 mg, 4 mg, Oral, Q6H PRN **OR** ondansetron  (ZOFRAN ) injection 4 mg, 4 mg, Intravenous, Q6H PRN, McClung, Sarah A, PA-C   oxyCODONE  (Oxy IR/ROXICODONE ) immediate release tablet 5-10 mg, 5-10 mg, Oral, Q4H PRN, Danton Lauraine LABOR, PA-C, 10 mg at 06/08/24 9379   polyethylene glycol (MIRALAX  / GLYCOLAX ) packet 17 g, 17 g, Oral, BID, Maczis, Michael M, PA-C, 17 g at 06/07/24 9062   sodium zirconium cyclosilicate  (LOKELMA ) packet 5 g, 5 g, Oral, Once, McClung, Sarah A, PA-C   Vitamin D  (Ergocalciferol ) (DRISDOL ) 1.25 MG (50000 UNIT) capsule 50,000 Units, 50,000 Units, Oral, Q7 days, Danton Lauraine LABOR, PA-C, 50,000 Units at 06/06/24 [phone]

## 2024-06-05 NOTE — Progress Notes (Signed)
 IP rehab admissions - I met with patient at the bedside.  She is interested in CIR stay.  She will DC home to [facility] after rehab.  Husband and 3 children will help at [facility] house during the day.  MIL can assist and provide supervision after rehab.  We will open the case with [employer] to request acute inpatient rehab admission.  Will follow up on Friday.  [phone]

## 2024-06-05 NOTE — Progress Notes (Signed)
 Physical Therapy Treatment Patient Details Name: Caitlyn [name] MRN: [id number] DOB: May 20, 1981 Today's Date: 06/05/2024   History of Present Illness 43 y.o. female admitted 05/27/24 after jumping from 3rd story window (suspected SI attempt vs ETOH abuse vs substance-induced mood disorder). Workup for L rib 3,5-7 fxs, L open tib-fib fx, and posterior hip dislocation with acetabulum fx. S/p L tibial ex fix with wound vac placement, L acetabular ORIF on 12/17. 12/19 removed external fixator with ORIF distal tib-fib and medical malleolus fractures PMH includes chronic R hip pain, anemia, DM, HTN, OSA, remote smoker.    PT Comments  Pt continues to make excellent progress towards her physical therapy goals and is motivated to participate. Able to perform bed and chair level AROM exercises for BLE's and AAROM RUE exercises with good tolerance within pain free range. Pt requiring CGA for squat pivot transfer from bed to chair towards R with good adherence to weightbearing precautions. Patient will benefit from intensive inpatient follow-up therapy, >3 hours/day for transfer training, wheelchair mobility, strengthening    If plan is discharge home, recommend the following: A little help with walking and/or transfers;A lot of help with bathing/dressing/bathroom;Assistance with cooking/housework;Assist for transportation;Help with stairs or ramp for entrance   Can travel by private vehicle        Equipment Recommendations  Wheelchair (measurements PT);Wheelchair cushion (measurements PT) (drop arm BSC)    Recommendations for Other Services       Precautions / Restrictions Precautions Precautions: Fall;Other (comment) Recall of Precautions/Restrictions: Intact Precaution/Restrictions Comments: L rib fxs Restrictions Weight Bearing Restrictions Per Provider Order: Yes LLE Weight Bearing Per Provider Order: Non weight bearing     Mobility  Bed Mobility Overal bed mobility: Needs Assistance Bed  Mobility: Supine to Sit     Supine to sit: Min assist     General bed mobility comments: MinA for LLE management into and out of bed    Transfers Overall transfer level: Needs assistance Equipment used: None Transfers: Bed to chair/wheelchair/BSC       Squat pivot transfers: Contact guard assist     General transfer comment: Squat pivot transfer to chair towards R with CGA    Ambulation/Gait                   Stairs             Wheelchair Mobility     Tilt Bed    Modified Rankin (Stroke Patients Only)       Balance Overall balance assessment: Needs assistance Sitting-balance support: Feet supported Sitting balance-Leahy Scale: Fair                                      Hotel Manager: No apparent difficulties  Cognition Arousal: Alert Behavior During Therapy: WFL for tasks assessed/performed   PT - Cognitive impairments: No apparent impairments                         Following commands: Intact      Cueing Cueing Techniques: Verbal cues  Exercises General Exercises - Lower Extremity Quad Sets: AROM, Both, 10 reps, Supine Gluteal Sets: AROM, Both, 10 reps, Supine Long Arc Quad: AROM, Both, 10 reps, Seated Heel Slides: AROM, Left, 10 reps, Supine Hip ABduction/ADduction: AAROM, Left, 5 reps, Supine Other Exercises Other Exercises: Sitting: RUE table slides x 20 forwards, diagonally  General Comments        Pertinent Vitals/Pain Pain Assessment Pain Assessment: Faces Faces Pain Scale: Hurts little more Pain Location: R shoulder, LLE Pain Descriptors / Indicators: Discomfort, Grimacing, Guarding, Sore Pain Intervention(s): Monitored during session, Ice applied    Home Living                          Prior Function            PT Goals (current goals can now be found in the care plan section) Acute Rehab PT Goals Patient Stated Goal: decreased pain, regain  strength, get back home Progress towards PT goals: Progressing toward goals    Frequency    Min 3X/week      PT Plan      Co-evaluation              AM-PAC PT 6 Clicks Mobility   Outcome Measure  Help needed turning from your back to your side while in a flat bed without using bedrails?: A Little Help needed moving from lying on your back to sitting on the side of a flat bed without using bedrails?: A Little Help needed moving to and from a bed to a chair (including a wheelchair)?: A Little Help needed standing up from a chair using your arms (e.g., wheelchair or bedside chair)?: A Lot Help needed to walk in hospital room?: Total Help needed climbing 3-5 steps with a railing? : Total 6 Click Score: 13    End of Session Equipment Utilized During Treatment: Gait belt Activity Tolerance: Patient tolerated treatment well Patient left: with call bell/phone within reach;in chair;with chair alarm set Nurse Communication: Mobility status PT Visit Diagnosis: Other abnormalities of gait and mobility (R26.89);Muscle weakness (generalized) (M62.81);Pain Pain - Right/Left: Left Pain - part of body: Leg     Time: 1035-1059 PT Time Calculation (min) (ACUTE ONLY): 24 min  Charges:    $Therapeutic Activity: 23-37 mins PT General Charges $$ ACUTE PT VISIT: 1 Visit                     Aleck Daring, PT, DPT Acute Rehabilitation Services Office [phone]    Aleck ONEIDA Daring 06/05/2024, 11:12 AM

## 2024-06-05 NOTE — Progress Notes (Signed)
 5 Days Post-Op   Subjective/Chief Complaint: No complaints. Feeling good. Tolerating diet   Objective: Vital signs in last 24 hours: Temp:  [98.2 F (36.8 C)-98.9 F (37.2 C)] 98.9 F (37.2 C) (12/24 0445) Pulse Rate:  [87-100] 87 (12/24 0445) Resp:  [16-18] 18 (12/23 2059) BP: (146-151)/(71-93) 146/71 (12/24 0445) SpO2:  [98 %-100 %] 98 % (12/24 0445) Last BM Date : 06/01/24  Intake/Output from previous day: 12/23 0701 - 12/24 0700 In: 1200 [P.O.:1200] Out: 1 [Stool:1] Intake/Output this shift: No intake/output data recorded.  General appearance: alert and cooperative Resp: clear to auscultation bilaterally Cardio: regular rate and rhythm GI: soft, nontender Extremities: warm. Moves toes  Lab Results:  No results for input(s): WBC, HGB, HCT, PLT in the last 72 hours. BMET No results for input(s): NA, K, CL, CO2, GLUCOSE, BUN, CREATININE, CALCIUM  in the last 72 hours. PT/INR No results for input(s): LABPROT, INR in the last 72 hours. ABG No results for input(s): PHART, HCO3 in the last 72 hours.  Invalid input(s): PCO2, PO2  Studies/Results: DG Shoulder Right Port Result Date: 06/03/2024 EXAM: 1 VIEW(S) XRAY OF THE RIGHT SHOULDER 06/03/2024 01:04:00 PM COMPARISON: None available. CLINICAL HISTORY: Right shoulder pain FINDINGS: BONES AND JOINTS: Glenohumeral joint is normally aligned. No acute fracture. No malalignment. The [facility] joint is unremarkable. SOFT TISSUES: No abnormal calcifications. Visualized lung is unremarkable. IMPRESSION: 1. No acute findings. Electronically signed by: Selinda Blue MD 06/03/2024 01:53 PM EST RP Workstation: [id number]    Anti-infectives: Anti-infectives (From admission, onward)    Start     Dose/Rate Route Frequency Ordered Stop   05/31/24 2000  ceFAZolin  (ANCEF ) IVPB 2g/100 mL premix        2 g 200 mL/hr over 30 Minutes Intravenous Every 8 hours 05/31/24 1612 06/01/24 1443   05/31/24 1114   vancomycin  (VANCOCIN ) powder  Status:  Discontinued          As needed 05/31/24 1116 05/31/24 1353   05/31/24 1041  ceFAZolin  (ANCEF ) 2-4 GM/100ML-% IVPB       Note to Pharmacy: Ezequiel Henri: cabinet override      05/31/24 1041 05/31/24 2244   05/29/24 2200  ceFAZolin  (ANCEF ) IVPB 2g/100 mL premix        2 g 200 mL/hr over 30 Minutes Intravenous Every 8 hours 05/29/24 1818 05/30/24 1529   05/29/24 1116  vancomycin  (VANCOCIN ) powder  Status:  Discontinued          As needed 05/29/24 1117 05/29/24 1314   05/28/24 0600  cefTRIAXone  (ROCEPHIN ) 1 g in sodium chloride  0.9 % 100 mL IVPB        1 g 200 mL/hr over 30 Minutes Intravenous Every 24 hours 05/28/24 0450 05/29/24 0650   05/27/24 2256  vancomycin  (VANCOCIN ) powder  Status:  Discontinued          As needed 05/27/24 2257 05/28/24 0017   05/27/24 1915  ceFAZolin  (ANCEF ) IVPB 2g/100 mL premix        2 g 200 mL/hr over 30 Minutes Intravenous STAT 05/27/24 1911 05/27/24 2000       Assessment/Plan: s/p Procedures: OPEN REDUCTION INTERNAL FIXATION (ORIF) OF LEFT DISTAL TIBIA/FIBULA FRACTURE (Left) Continue diet Jump out of window   Suspected suicide attempt - psych consulted, recommend 1:1 observation, now recommending outpatient psych follow up instead of inpatient admission. L rib fx 3/5-7 - pain control, pulm toilet  L open tib-fib fx - per Dr. Kendal, s/p ORIF L acetabulum, ex-fix L ankle 12/16, closed reduction and  washout with VAC placement 12/17. S/p ORIF L tib-fib and L malleolus fx 12/19. NWB LLE.  Posterior L hip dislocation with associated acetabulum fx - per Dr. Kendal, s/p ORIF 12/17. Acute R shoulder pain - XR 12/22 showed no acute findings. Ortho has been made aware of right shoulder pain. They plan to further assess as outpatient. Discussed with attending as well. ABL anemia - S/p 1U PRBC 12/18. Hgb stable 12/20 at 8.1 R toe pain - xray negative for acute fracture HTN - amlodipine  started   FEN - Regular diet,  bowel regimen DVT - SCDs, LMWH ID - Periop ancef  completed Dispo - 6N, PT/OT.  Psych recommending outpatient follow up. Continue therapies, recommending CIR at discharge.  LOS: 9 days    Deward Null III 06/05/2024

## 2024-06-06 LAB — GLUCOSE, CAPILLARY
Glucose-Capillary: 197 mg/dL — ABNORMAL HIGH (ref 70–99)
Glucose-Capillary: 171 mg/dL — ABNORMAL HIGH (ref 70–99)
Glucose-Capillary: 177 mg/dL — ABNORMAL HIGH (ref 70–99)
Glucose-Capillary: 135 mg/dL — ABNORMAL HIGH (ref 70–99)

## 2024-06-06 MED ORDER — POLYETHYLENE GLYCOL 3350 17 G PO PACK
17.0000 g | PACK | Freq: Two times a day (BID) | ORAL | Status: DC
  Administered 2024-06-06 – 2024-06-07 (×2): 17 g via ORAL
  Filled 2024-06-06 (×4): qty 1

## 2024-06-06 MED ORDER — MAGNESIUM HYDROXIDE 400 MG/5ML PO SUSP
15.0000 mL | Freq: Once | ORAL | Status: AC
  Administered 2024-06-06: 15 mL via ORAL
  Filled 2024-06-06: qty 30

## 2024-06-06 NOTE — Progress Notes (Signed)
 "   6 Days Post-Op  Subjective: CC: No new complaints. R shoulder is feeling better. Pain well controlled. Tolerating PO without n/v. Last BM 12/20.  Voiding.  Working with therapies.   Objective: Vital signs in last 24 hours: Temp:  [97.7 F (36.5 C)-98.7 F (37.1 C)] 98.7 F (37.1 C) (12/25 0954) Pulse Rate:  [82-92] 82 (12/25 0954) Resp:  [16-17] 16 (12/25 0954) BP: (134-174)/(83-111) 156/93 (12/25 0954) SpO2:  [96 %-100 %] 96 % (12/25 0954) Last BM Date : 06/01/24  Intake/Output from previous day: No intake/output data recorded. Intake/Output this shift: No intake/output data recorded.  PE: Gen:  Alert, NAD, pleasant Card:  RRR Pulm:  Rate and effort normal Abd: ND Ext:  Splint present of LLE. Toes warm and well-perfused. Able to mobilize toes and sensation is intact. R radial 2+. Able to move all other extremities.  Psych: A&Ox3   Lab Results:  No results for input(s): WBC, HGB, HCT, PLT in the last 72 hours. BMET No results for input(s): NA, K, CL, CO2, GLUCOSE, BUN, CREATININE, CALCIUM  in the last 72 hours. PT/INR No results for input(s): LABPROT, INR in the last 72 hours. CMP     Component Value Date/Time   NA 136 06/01/2024 0653   NA 135 01/12/2023 1038   K 3.2 (L) 06/01/2024 0653   CL 102 06/01/2024 0653   CO2 19 (L) 06/01/2024 0653   GLUCOSE 144 (H) 06/01/2024 0653   GLUCOSE 107 (H) 09/09/2013 1237   BUN 6 06/01/2024 0653   BUN 9 01/12/2023 1038   CREATININE 0.48 06/01/2024 0653   CREATININE 0.62 02/03/2014 0931   CALCIUM  8.1 (L) 06/01/2024 0653   PROT 6.2 (L) 05/30/2024 0601   PROT 7.1 01/12/2023 1038   ALBUMIN  3.7 05/30/2024 0601   ALBUMIN  4.4 01/12/2023 1038   AST 65 (H) 05/30/2024 0601   ALT 30 05/30/2024 0601   ALKPHOS 53 05/30/2024 0601   BILITOT 0.5 05/30/2024 0601   BILITOT 0.3 01/12/2023 1038   GFRNONAA >60 06/01/2024 0653   GFRAA >60 07/02/2019 1311   Lipase     Component Value Date/Time   LIPASE  15 10/17/2008 0950    Studies/Results: No results found.  Anti-infectives: Anti-infectives (From admission, onward)    Start     Dose/Rate Route Frequency Ordered Stop   05/31/24 2000  ceFAZolin  (ANCEF ) IVPB 2g/100 mL premix        2 g 200 mL/hr over 30 Minutes Intravenous Every 8 hours 05/31/24 1612 06/01/24 1443   05/31/24 1114  vancomycin  (VANCOCIN ) powder  Status:  Discontinued          As needed 05/31/24 1116 05/31/24 1353   05/31/24 1041  ceFAZolin  (ANCEF ) 2-4 GM/100ML-% IVPB       Note to Pharmacy: Ezequiel Henri: cabinet override      05/31/24 1041 05/31/24 2244   05/29/24 2200  ceFAZolin  (ANCEF ) IVPB 2g/100 mL premix        2 g 200 mL/hr over 30 Minutes Intravenous Every 8 hours 05/29/24 1818 05/30/24 1529   05/29/24 1116  vancomycin  (VANCOCIN ) powder  Status:  Discontinued          As needed 05/29/24 1117 05/29/24 1314   05/28/24 0600  cefTRIAXone  (ROCEPHIN ) 1 g in sodium chloride  0.9 % 100 mL IVPB        1 g 200 mL/hr over 30 Minutes Intravenous Every 24 hours 05/28/24 0450 05/29/24 0650   05/27/24 2256  vancomycin  (VANCOCIN ) powder  Status:  Discontinued  As needed 05/27/24 2257 05/28/24 0017   05/27/24 1915  ceFAZolin  (ANCEF ) IVPB 2g/100 mL premix        2 g 200 mL/hr over 30 Minutes Intravenous STAT 05/27/24 1911 05/27/24 2000        Assessment/Plan Jump out of window   Suspected suicide attempt - psych consulted, recommend 1:1 observation, now recommending outpatient psych follow up instead of inpatient admission. L rib fx 3/5-7 - pain control, pulm toilet  L open tib-fib fx - per Dr. Kendal, s/p ORIF L acetabulum, ex-fix L ankle 12/16, closed reduction and washout with VAC placement 12/17. S/p ORIF L tib-fib and L malleolus fx 12/19. NWB LLE.  Posterior L hip dislocation with associated acetabulum fx - per Dr. Kendal, s/p ORIF 12/17. Acute R shoulder pain - XR 12/22 showed no acute findings. Ortho has been made aware of right shoulder pain.  They plan to further assess as outpatient. Discussed with attending as well. ABL anemia - S/p 1U PRBC 12/18. Hgb stable 12/20 at 8.1 R toe pain - xray negative for acute fracture HTN - amlodipine  started   FEN - Regular diet, increase bowel regimen DVT - SCDs, LMWH ID - Periop ancef  completed Dispo - 6N, PT/OT.  Psych recommending outpatient follow up. Continue therapies, recommending CIR at discharge.  I reviewed nursing notes, last 24 h vitals and pain scores, last 48 h intake and output, last 24 h labs and trends, and last 24 h imaging results.    LOS: 10 days    Caitlyn [name], [facility] 06/06/2024, 9:59 AM Please see Amion for pager number during day hours 7:00am-4:30pm  "

## 2024-06-06 NOTE — Plan of Care (Signed)

## 2024-06-06 NOTE — Consult Note (Signed)
 [facility] Face-to-Face Psychiatric Evaluation Follow Up   Service Date: June 06, 2024 LOS:  LOS: 10 days    Assessment  Caitlyn [name] is a 43 y.o. female admitted medically on 05/27/2024  7:06 PM for multiple fractures including left ribs, left hip and tib-fib fracture following a level 2 trauma after jumping from a third story window.  Patient carries no former psychiatric diagnoses.  She has a significant medical history consisting of diabetes mellitus, hypertension, hyperlipidemia, obstructive sleep apnea, chronic hip pain and GERD.  Psychiatry was consulted due to concern for suicide attempt.  Patient unable to recall specific events with her leading to falling out of the window. Patient stays on the 3rd floor and EMS suspects it was a SI attempt. Suspect patient is minimizing alcohol and could have some underlying depressive mood symptoms. She denies that it was SI attempt or any SI ideations previously or currently.  Patient's blood alcohol level was 186 upon admission, patient's husband also voiced concerns for patients drinking behavior and he suspects, she has been sneaking to drink. Husband, doesn't believe it was a suicide attempt, and states she has had memory issues previously before. Current differential includes alcohol abuse versus substance-induced mood disorder.  Patient does report some low mood, issues with sleep, feelings of hopelessness and inability to perform some task in the setting of ongoing hip, knee and back pain.  Patient reports her pain limits her ability to work, and enjoy certain aspects of her life as she once did. To address mood symptoms and pain will start Cymbalta .   Will also add on low-dose gabapentin  to assist with pain control and could be helpful if patient experiencing any anxiety.    Updated Assessment:  12/18 Went to assess patient at bedside, husband and sister were there for support.  Husband eventually left.  Patient continues to  deny any significant mood symptoms, and is tolerating Cymbalta  without difficulty.  Reports improvement with mood.  She continues to not understand, the purpose of her attempting to grab the latch on the wall window underneath the third story window.  She denies any suicidal ideations, homicidal ideation auditory visual hallucinations.  Patient states that she is scheduled to have another procedure done either Friday or Monday.  Will continue to follow along with psych to consider disposition planning of inpatient psychiatric treatment vs follow-up outpatient resources.   12/19: The plan remains unchanged.  Although she denies any active suicidal or homicidal ideations, we will continue with planning for inpatient psychiatric once she is medically stable.  Patient is currently in surgery.   12/20: Patient seen face to face in her hospital room with her sister and 1:1 staff at bedside. She is awake, alert and oriented x 4. Patient appears calm and cooperative and reports doing well today. Patient reports she slept well overnight and does not experience pain. She denies depressive symptoms, anxiety, nervousness, worry, psychosis, suicidal or homicidal ideation, intent or plan. She continues to deny suicidal intent for jumping out of the 3rd floor window.   12/22: Patient continues to strongly deny that this was a suicide attempt. Patient's husband also confirms that this was not a suicide attempt but there is concern of patient's alcohol use. Given the duration of the hospital stay and seeing improvement of symptoms while being started on psychotropic meds, we proceeded with safety planning with her husband, discussing 24/7 oversight until her outpatient followup appoint with psychiatry. I communicated this with patient's husband and he  plans on temporarily moving in to his mother's home and he agrees to closely monitor the patient and on the hours that he is unable to, his mother and 78 year old daughter  will be able to monitor her until her outpatient psychiatry appointment.   At this time, she does not meet criteria for inpatient psychiatric hospitalization as she is not an imminent danger to herself or others and she contracts for safety. Patient is tolerating Cymbalta  during this hospitalization. She would greatly benefit from substance use treatment and patient's husband is in agreement.   12/23: Patient remains pleasant and cooperative. She is open to going home once medically stable with the plan of close oversight with her family and going to [facility] or [facility]. I communicated this with the PHP and [facility] coordinators and they feel that she is appropriate for [facility] at this time with how the alcohol use has been negatively impacting her symptoms of depression and anxiety. They will be in contact with the patient. She is agreeable to continue taking her Cymbalta  and gabapentin . Continues to not be an imminent danger to herself.   06/06/24: Patient was evaluated face-to-face in her hospital room with her husband present at the bedside. She is awake, alert, and oriented 4. Patient reports sleeping well overnight and states she enjoyed his breakfast, she's been compliant with her psychotropic medications and pain medications with no issues. Patient denies auditory or visual hallucinations, delusions, or other psychotic symptoms. She also denies depressive symptoms, anxiety, nervousness, apprehension, racing thoughts, mood swings, or any suicidal or homicidal ideation, intent, or plan. We will continue to monitor patient for improvement.     Diagnoses:  Active Hospital problems: Principal Problem:   Pelvic fracture (HCC) Active Problems:   Type I or II open fracture of left ankle   Dislocation of hip, posterior, left, closed (HCC)   Open pilon fracture, left, type I or II, initial encounter   At risk for depression   Fall from window   Alcoholic intoxication with complication   Plan  ## Safety  and Observation Level:  - Based on my clinical evaluation, I estimate the patient to be at low risk of self harm in the current setting - At this time, we recommend a routine level of observation. This decision is based on my review of the chart including patient's history and current presentation, interview of the patient, mental status examination, and consideration of suicide risk including evaluating suicidal ideation, plan, intent, suicidal or self-harm behaviors, risk factors, and protective factors. This judgment is based on our ability to directly address suicide risk, implement suicide prevention strategies and develop a safety plan while the patient is in the clinical setting. Please contact our team if there is a concern that risk level has changed.  ## Medications:  -- Continue Cymbalta  20 mg daily for depression/neuropathic pain - Continue Gabapentin  100 mg 3 times daily for neuropathic pain - Continue Melatonin 5 mg at bedtime for sleep difficulties  ## Medical Decision Making Capacity:  Not specifically addressed  ## Further Work-up:  - TSH WNL, Vitamin B12 WNL, Folate WNL, Vitamin D  <6 -- most recent EKG on 12/15 f/u formal read -- Pertinent labwork reviewed earlier this admission includes: CBC with leukocytosis of 11.8, ethanol level of 186, hypokalemia of 5.3, lactic acid 6.3, UDS negative, AST at 116, ALT of 49, creatinine peaked at 1.40  ## Disposition:  -- Appropriate disposition is outpatient psychiatry with substance use management like [facility] or PHP  ##  Behavioral / Environmental:  -- None  ##Legal Status No ongoing legal issues  Thank you for this consult request. Recommendations have been communicated to the primary team.  We will we continue to follow at this time.   Jan DELENA Donath, MD   NEW history  Relevant Aspects of Hospital Course:  Admitted on 05/27/2024 as a level 2 trauma after jumping from a third story window. EMS thinks that the patient jumped  intentionally. The patient is unwilling to provide a history as to why she jumped out of the window.  Orthopedics was consulted for a left hip dislocation with an associated acetabulum fracture and a left open pilon fracture. She was admitted to the trauma service.  She underwent an irrigation and debridement of left open pilon fracture and external fixation with left hip closed reduction.  Patient Report:  12/23: Patient seen sitting in her bed, husband at bedside. Patient is feeling well. She denies SI, HI, and AVH. She agrees to go to either PHP or [facility] when she is medically stable. She understands why she is on Cymbalta  and gabapentin . She agrees to follow up with an outpatient psychiatrist.   Collateral information taken previously:  Patient's husband was bedside and contributed to history, also discussed further collateral and the hallway  Husband denies that he thinks that incident that occurred at home was a suicide attempt.  He suspects that she was drinking and sent him to get the kids.  He says in the past he has suspected that her drinking has been worsening, however he always throws alcohol out of the house whenever he finds it.  Reports that she has had lapses in memory and difficulty with recall and prior instances.  He suspects that she needs help with her drinking.  Collateral information on 12/22: Spoke with husband on 12/22, he is concerned about her alcohol use and feels this is occurring almost daily. He denies that this situation was a suicide attempt and feels pt is appropriate to return home when she is medically stabilized.SABRA He wants to patient to get resources for substance use treatment and depression. I discussed the options of OP psychiatry and [facility]. I discussed that if the pt does become a safety concern and is an immident danger to herself or others that she can go to [facility] for urgent management. He also plans on moving in with his mother's home who lives in a home. He  also will be able to monitor the patient very frequently and on the hours that he has to pick up the children, his eldest daughter who is 33 and his mother can monitor her.   Collateral information on 12/23: I confirmed with patient's mother in law that when patient is medically stable that her, her husband, and their children will temporarily move into her home until she attends her outpatient services.   Psychiatric History:  Information collected from patient Patient denies any prior psychiatric medication trials. Patient denies any current psychiatric provider or therapy utilized. No prior psychiatric hospitalizations, no prior history of suicidal ideations, attempts or self injures behavior.  Family psych history: Son, ADHD No further psychiatric history disclosed  Social History:  Patient states that she grew up in [place]  with her mom, dad and 14 other siblings.  She reports that she graduated high school in [place]  and completed online.  She later moved to [place] with her mom.  She currently lives with her husband and 2 daughters and 2 sons.  Respective  ages are 74 and 4, 35 and 69.  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  Patient been married to her current husband since 2020, however they have been dating since 2009.  She reports that she feels supported by her husband.  She denies any ongoing legal issues and does not possess any firearms  Tobacco use: Denies current use, remote use 20 years ago Alcohol use: Reports occasional drinking on the weekend 1-2 mixed drinks of vodka and pineapple juice Drug use: Denies any further current use  Family History:   The patient's family history is not on file.  Medical History: Past Medical History:  Diagnosis Date   Anemia    Diabetes mellitus without complication (HCC)    GM - Diet  controlled - no meds   Hypertension    PIH with pregnancy   Sleep apnea    does not use CPAP - lost weight   SVD (spontaneous vaginal delivery)    x 2    Surgical History: Past Surgical History:  Procedure Laterality Date   CESAREAN SECTION  03/13/2010   CESAREAN SECTION WITH BILATERAL TUBAL LIGATION N/A 02/25/2014   Procedure: CESAREAN SECTION WITH BILATERAL TUBAL LIGATION;  Surgeon: Lang JINNY Peel, DO;  Location: [facility];  Service: Obstetrics;  Laterality: N/A;   EXTERNAL FIXATION LEG Left 05/27/2024   Procedure: EXTERNAL FIXATION, LOWER EXTREMITY;  Surgeon: Georgina Ozell LABOR, MD;  Location: [facility] OR;  Service: Orthopedics;  Laterality: Left;   EXTERNAL FIXATION, ANKLE Left 05/29/2024   Procedure: ADJUSTMENT EXTERNAL FIXATION, ANKLE;  Surgeon: Kendal Franky SQUIBB, MD;  Location: [facility] OR;  Service: Orthopedics;  Laterality: Left;   HIP CLOSED REDUCTION Left 05/27/2024   Procedure: CLOSED REDUCTION, HIP AND APPLICATION TRACTION WITH PIN PLACEMENT;  Surgeon: Georgina Ozell LABOR, MD;  Location: [facility] OR;  Service: Orthopedics;  Laterality: Left;   IRRIGATION AND DEBRIDEMENT POSTERIOR HIP Left 05/27/2024   Procedure: IRRIGATION AND DEBRIDEMENT, OPEN FRACTURE;  Surgeon: Georgina Ozell LABOR, MD;  Location: [facility];  Service: Orthopedics;  Laterality: Left;   OPEN REDUCTION INTERNAL FIXATION (ORIF) TIBIA/FIBULA FRACTURE Left 05/31/2024   Procedure: OPEN REDUCTION INTERNAL FIXATION (ORIF) OF LEFT DISTAL TIBIA/FIBULA FRACTURE;  Surgeon: Kendal Franky SQUIBB, MD;  Location: [facility];  Service: Orthopedics;  Laterality: Left;   OPEN REDUCTION INTERNAL FIXATION ACETABULUM POSTERIOR LATERAL Left 05/29/2024   Procedure: OPEN REDUCTION INTERNAL FIXATION ACETABULUM POSTERIOR LATERAL;  Surgeon: Kendal Franky SQUIBB, MD;  Location: [facility];  Service: Orthopedics;  Laterality: Left;   TOOTH EXTRACTION      Medications:  Current Medications[1]  Allergies: Allergies[2]     Objective  Vital signs:  Temp:  [97.7 F (36.5 C)-98.7 F  (37.1 C)] 98.7 F (37.1 C) (12/25 0954) Pulse Rate:  [82-92] 82 (12/25 0954) Resp:  [16-17] 16 (12/25 0954) BP: (134-174)/(83-111) 156/93 (12/25 0954) SpO2:  [96 %-100 %] 96 % (12/25 0954)  Psychiatric Specialty Exam:  Presentation  General Appearance: Appropriate for Environment; Fairly Groomed  Eye Contact:Good  Speech:Clear and Coherent; Normal Rate  Speech Volume:Normal  Handedness:Right   Mood and Affect  Mood:Euthymic  Affect:Congruent; Appropriate; Full Range   Thought Process  Thought Processes:Coherent; Goal Directed; Linear  Descriptions of Associations:Intact  Orientation:Full (Time, Place and Person)  Thought Content:Logical  History of Schizophrenia/Schizoaffective disorder:no Duration of Psychotic Symptoms: na Hallucinations:No data recorded    Ideas of Reference:None  Suicidal Thoughts:No data recorded    Homicidal Thoughts:No data recorded     Sensorium  Memory:Immediate Good; Recent Good; Remote Good  Judgment:Fair  Insight:Fair   Executive Functions  Concentration:Good  Attention Span:Good  Recall:Good  Fund of Knowledge:Good  Language:Good   Psychomotor Activity  Psychomotor Activity:No data recorded   Assets  Assets:Communication Skills; Desire for Improvement; Resilience  Sleep  Sleep:No data recorded   Physical Exam: Physical Exam Review of Systems  Musculoskeletal:  Positive for back pain and joint pain.  Psychiatric/Behavioral:  Negative for depression, hallucinations, substance abuse and suicidal ideas. The patient has insomnia. The patient is not nervous/anxious.    Blood pressure (!) 156/93, pulse 82, temperature 98.7 F (37.1 C), temperature source Oral, resp. rate 16, height 5' 4 (1.626 m), weight 103.4 kg, last menstrual period 05/20/2024, SpO2 96%. Body mass index is 39.14 kg/m.   Jan Donath, MD Attending psychiatrist.          [1]  Current Facility-Administered Medications:     acetaminophen  (TYLENOL ) tablet 1,000 mg, 1,000 mg, Oral, Q6H, McClung, Sarah A, PA-C, 1,000 mg at 06/06/24 9471   amLODipine  (NORVASC ) tablet 5 mg, 5 mg, Oral, Daily, Kinsinger, Herlene Righter, MD, 5 mg at 06/06/24 9087   calcium  gluconate 2 g/ 100 mL sodium chloride  IVPB, 2 g, Intravenous, Once, McClung, Sarah A, PA-C   Chlorhexidine  Gluconate Cloth 2 % PADS 6 each, 6 each, Topical, Daily, Danton Lauraine LABOR, PA-C, 6 each at 06/06/24 9086   docusate sodium  (COLACE) capsule 100 mg, 100 mg, Oral, BID, Danton Lauraine LABOR, PA-C, 100 mg at 06/06/24 0912   DULoxetine  (CYMBALTA ) DR capsule 20 mg, 20 mg, Oral, Daily, Danton Lauraine LABOR, PA-C, 20 mg at 06/06/24 9087   enoxaparin  (LOVENOX ) injection 30 mg, 30 mg, Subcutaneous, Q12H, Danton Lauraine LABOR, PA-C, 30 mg at 06/06/24 9087   gabapentin  (NEURONTIN ) capsule 100 mg, 100 mg, Oral, TID, Danton Lauraine LABOR, PA-C, 100 mg at 06/06/24 9087   hydrALAZINE  (APRESOLINE ) injection 10 mg, 10 mg, Intravenous, Q2H PRN, Danton Lauraine LABOR, PA-C, 10 mg at 06/03/24 0915   HYDROmorphone  (DILAUDID ) injection 0.5 mg, 0.5 mg, Intravenous, Q2H PRN, Danton Lauraine LABOR, PA-C, 0.5 mg at 06/04/24 2215   insulin  aspart (novoLOG ) injection 0-15 Units, 0-15 Units, Subcutaneous, TID WC, Danton Lauraine LABOR, PA-C, 3 Units at 06/06/24 9086   lidocaine  (LIDODERM ) 5 % 1 patch, 1 patch, Transdermal, Q24H, Danton Lauraine LABOR, PA-C, 1 patch at 06/06/24 9089   magnesium  hydroxide (MILK OF MAGNESIA) suspension 15 mL, 15 mL, Oral, Once, Maczis, Michael M, PA-C   melatonin tablet 5 mg, 5 mg, Oral, QHS, Danton Lauraine LABOR, PA-C, 5 mg at 06/05/24 2107   metoCLOPramide  (REGLAN ) tablet 5-10 mg, 5-10 mg, Oral, Q8H PRN **OR** metoCLOPramide  (REGLAN ) injection 5-10 mg, 5-10 mg, Intravenous, Q8H PRN, McClung, Sarah A, PA-C   metoprolol  tartrate (LOPRESSOR ) injection 5 mg, 5 mg, Intravenous, Q6H PRN, McClung, Sarah A, PA-C   ondansetron  (ZOFRAN -ODT) disintegrating tablet 4 mg, 4 mg, Oral, Q6H PRN **OR** ondansetron  (ZOFRAN )  injection 4 mg, 4 mg, Intravenous, Q6H PRN, McClung, Sarah A, PA-C   oxyCODONE  (Oxy IR/ROXICODONE ) immediate release tablet 5-10 mg, 5-10 mg, Oral, Q4H PRN, Danton Lauraine LABOR, PA-C, 10 mg at 06/06/24 0336   polyethylene glycol (MIRALAX  / GLYCOLAX ) packet 17 g, 17 g, Oral, BID, Maczis, Michael M, PA-C   sodium zirconium cyclosilicate  (LOKELMA ) packet 5 g, 5 g, Oral, Once, McClung, Sarah A, PA-C   Vitamin D  (Ergocalciferol ) (DRISDOL ) 1.25 MG (50000 UNIT)  capsule 50,000 Units, 50,000 Units, Oral, Q7 days, McClung, Sarah A, PA-C, 50,000 Units at 06/06/24 9471 [2]  Allergies Allergen Reactions   Codeine Anaphylaxis and Swelling    Eye swelling, puffiness  OK to take oxycodone /Percocet with no issues

## 2024-06-07 LAB — GLUCOSE, CAPILLARY
Glucose-Capillary: 180 mg/dL — ABNORMAL HIGH (ref 70–99)
Glucose-Capillary: 212 mg/dL — ABNORMAL HIGH (ref 70–99)
Glucose-Capillary: 160 mg/dL — ABNORMAL HIGH (ref 70–99)
Glucose-Capillary: 232 mg/dL — ABNORMAL HIGH (ref 70–99)

## 2024-06-07 MED ORDER — METHOCARBAMOL 500 MG PO TABS
500.0000 mg | ORAL_TABLET | Freq: Four times a day (QID) | ORAL | Status: DC | PRN
  Administered 2024-06-07 – 2024-06-08 (×4): 500 mg via ORAL
  Filled 2024-06-07 (×4): qty 1

## 2024-06-07 MED ORDER — MAGNESIUM CITRATE PO SOLN
0.5000 | Freq: Once | ORAL | Status: AC
  Administered 2024-06-07: 0.5 via ORAL
  Filled 2024-06-07: qty 296

## 2024-06-07 NOTE — Progress Notes (Signed)
 Physical Therapy Treatment Patient Details Name: Caitlyn [name] MRN: [id number] DOB: 04-26-1981 Today's Date: 06/07/2024   History of Present Illness 43 y.o. female admitted 05/27/24 after jumping from 3rd story window (suspected SI attempt vs ETOH abuse vs substance-induced mood disorder). Workup for L rib 3,5-7 fxs, L open tib-fib fx, and posterior hip dislocation with acetabulum fx. S/p L tibial ex fix with wound vac placement, L acetabular ORIF on 12/17. 12/19 removed external fixator with ORIF distal tib-fib and medical malleolus fractures. PMH includes chronic R hip pain, anemia, DM, HTN, OSA, remote smoker.    PT Comments  Pt received in supine, agreeable to therapy session after RN premedicated her for pain and pt with good participation and excellent tolerance for squat pivot and slide board transfers to/from wheelchair/bed/chair and for wheelchair mobility in hallway. HR to mid-120's bpm with exertion and pt needing up to minA for transfers and wheelchair mobility due to R drift/veer and difficulty navigating in narrow areas of room/hall. Pt reports only mild fatigue post-exertion, eager to progress mobilizing in hallway and working on strength/endurance. Patient will benefit from intensive inpatient follow-up therapy, >3 hours/day.    If plan is discharge home, recommend the following: A little help with walking and/or transfers;A lot of help with bathing/dressing/bathroom;Assistance with cooking/housework;Assist for transportation;Help with stairs or ramp for entrance   Can travel by private vehicle        Equipment Recommendations  Wheelchair (measurements PT);Wheelchair cushion (measurements PT) (drop arm BSC; may need slide board for car transfers pending progress)    Recommendations for Other Services       Precautions / Restrictions Precautions Precautions: Fall;Other (comment) Recall of Precautions/Restrictions: Intact Precaution/Restrictions Comments: L rib  fxs Restrictions Weight Bearing Restrictions Per Provider Order: Yes LLE Weight Bearing Per Provider Order: Non weight bearing     Mobility  Bed Mobility Overal bed mobility: Needs Assistance Bed Mobility: Supine to Sit     Supine to sit: Contact guard     General bed mobility comments: CGA for safety, pt using bed rail and HOB elevated to assist.    Transfers Overall transfer level: Needs assistance Equipment used: None, Sliding board Transfers: Bed to chair/wheelchair/BSC       Squat pivot transfers: Min assist    Lateral/Scoot Transfers: Min assist, With slide board General transfer comment: Squat pivot from elevated bed>wheelchair on her R, then from [facility] to drop arm recliner pt using slide board for safety due to increased space between surfaces and minA each transfer to ensure LLE stays off floor and for safety/stability. Min cues for UE placement and technique as well as checking brakes prior to performing transfers.    Ambulation/Gait               General Gait Details: pt reports not feeling comfortable to attempt stand pivot transfer with RW yet.   Stairs             [employer] mobility: Yes Wheelchair propulsion: Both upper extremities Wheelchair parts: Needs assistance Distance: 150 (including brief seated rest breaks) Wheelchair Assistance Details (indicate cue type and reason): HR to ~124 bpm with exertion, SpO2 98% on RA. Pt needing initial cues for use of brakes and safe technique to propel chair and turn. Pt needs frequent reminders to maintain chair in middle of hallway as she tends to veer to R side of hallway and frequent minA to maintain steering in correct direction and avoid obstacles, esp in narrower areas. Increased  time to perform.   Tilt Bed    Modified Rankin (Stroke Patients Only)       Balance Overall balance assessment: Needs assistance Sitting-balance support: Feet  supported Sitting balance-Leahy Scale: Fair Sitting balance - Comments: tolerates sitting EOB with and without UE support       Standing balance comment: pt defers to attempt standing to RW                            Communication Communication Communication: No apparent difficulties  Cognition Arousal: Alert Behavior During Therapy: WFL for tasks assessed/performed   PT - Cognitive impairments: No apparent impairments                         Following commands: Intact      Cueing Cueing Techniques: Verbal cues, Gestural cues  Exercises      General Comments General comments (skin integrity, edema, etc.): Up in recliner with pillow under lower portion of BLE for elevation/comfort, HR ~100 bpm sitting and SpO2 WFL throughout. Chair alarm on for pt safety and call bell/phone in reach.      Pertinent Vitals/Pain Pain Assessment Pain Assessment: No/denies pain (premedicated by RN just prior to session)    Home Living                          Prior Function            PT Goals (current goals can now be found in the care plan section) Acute Rehab PT Goals Patient Stated Goal: decreased pain, regain strength, get back home Progress towards PT goals: Progressing toward goals    Frequency    Min 3X/week      PT Plan      Co-evaluation              AM-PAC PT 6 Clicks Mobility   Outcome Measure  Help needed turning from your back to your side while in a flat bed without using bedrails?: A Little Help needed moving from lying on your back to sitting on the side of a flat bed without using bedrails?: A Little (needs rail) Help needed moving to and from a bed to a chair (including a wheelchair)?: A Little Help needed standing up from a chair using your arms (e.g., wheelchair or bedside chair)?: Total Help needed to walk in hospital room?: Total Help needed climbing 3-5 steps with a railing? : Total 6 Click Score: 12    End  of Session Equipment Utilized During Treatment: Gait belt Activity Tolerance: Patient tolerated treatment well Patient left: with call bell/phone within reach;in chair;with chair alarm set Nurse Communication: Mobility status PT Visit Diagnosis: Other abnormalities of gait and mobility (R26.89);Muscle weakness (generalized) (M62.81);Pain     Time: 8560-8491 PT Time Calculation (min) (ACUTE ONLY): 29 min  Charges:    $Therapeutic Activity: 8-22 mins $Wheel Chair Management: 8-22 mins PT General Charges $$ ACUTE PT VISIT: 1 Visit                     Omarii Scalzo P., PTA Acute Rehabilitation Services Secure Chat Preferred 9a-5:30pm Office: [phone]    Connell HERO [facility] 06/07/2024, 3:22 PM

## 2024-06-07 NOTE — Plan of Care (Signed)
   Problem: Education: Goal: Knowledge of General Education information will improve Description: Including pain rating scale, medication(s)/side effects and non-pharmacologic comfort measures Outcome: Progressing   Problem: Health Behavior/Discharge Planning: Goal: Ability to manage health-related needs will improve Outcome: Progressing   Problem: Activity: Goal: Risk for activity intolerance will decrease Outcome: Progressing

## 2024-06-07 NOTE — Progress Notes (Signed)
 Inpatient Rehab Admissions Coordinator:  Saw pt at bedside. Informed her and her husband that received insurance authorization. Plan to admit pt to CIR on Saturday, 06/08/24. Michael Maczis aware and in agreement. Dr. Lovorn will assess pt for final decision regarding CIR admission.    Tinnie Yvone Cohens, MS, CCC-SLP Admissions Coordinator [phone]

## 2024-06-07 NOTE — TOC Progression Note (Signed)
 Transition of Care [facility]) - Progression Note    Patient Details  Name: Caitlyn [name] MRN: [id number] Date of Birth: Nov 29, 1980  Transition of Care [facility]) CM/SW Contact  Carmelita FORBES Carbon, LCSW Phone Number: 06/07/2024, 9:21 AM  Clinical Narrative:    ICM is following for CIR insurance auth.    Expected Discharge Plan: Psychiatric Hospital Barriers to Discharge: Continued Medical Work up               Expected Discharge Plan and Services   Discharge Planning Services: CM Consult   Living arrangements for the past 2 months: Apartment                                       Social Drivers of Health (SDOH) Interventions SDOH Screenings   Food Insecurity: No Food Insecurity (05/28/2024)  Housing: Low Risk (05/28/2024)  Transportation Needs: No Transportation Needs (05/28/2024)  Utilities: Not At Risk (05/28/2024)  Alcohol Screen: Low Risk (10/14/2022)  Depression (PHQ2-9): Low Risk (10/14/2022)  Financial Resource Strain: Medium Risk (10/14/2022)  Physical Activity: Unknown (10/14/2022)  Social Connections: Moderately Isolated (10/14/2022)  Stress: No Stress Concern Present (10/14/2022)  Tobacco Use: Medium Risk (05/29/2024)    Readmission Risk Interventions     No data to display

## 2024-06-07 NOTE — Progress Notes (Signed)
 "   7 Days Post-Op  Subjective: CC: No new complaints. Pain well controlled. Reports hip stiffness, pre-dating hospital admission. Tolerating PO without n/v. Last BM 12/20.  Voiding.  Working with therapies.   Objective: Vital signs in last 24 hours: Temp:  [98.4 F (36.9 C)-98.8 F (37.1 C)] 98.6 F (37 C) (12/26 0952) Pulse Rate:  [82-91] 91 (12/26 0952) Resp:  [16-19] 16 (12/26 0952) BP: (125-156)/(77-111) 156/111 (12/26 0952) SpO2:  [96 %-100 %] 100 % (12/26 0952) Last BM Date : 06/01/24 (pt reported)  Intake/Output from previous day: No intake/output data recorded. Intake/Output this shift: Total I/O In: 100 [P.O.:100] Out: -   PE: Gen:  Alert, NAD, pleasant Card:  RRR Pulm:  Rate and effort normal Abd: ND Ext:  Splint present of LLE. Toes warm and well-perfused. Able to mobilize toes and sensation is intact. R radial 2+. Able to move all other extremities.  Psych: A&Ox3   Lab Results:  No results for input(s): WBC, HGB, HCT, PLT in the last 72 hours. BMET No results for input(s): NA, K, CL, CO2, GLUCOSE, BUN, CREATININE, CALCIUM  in the last 72 hours. PT/INR No results for input(s): LABPROT, INR in the last 72 hours. CMP     Component Value Date/Time   NA 136 06/01/2024 0653   NA 135 01/12/2023 1038   K 3.2 (L) 06/01/2024 0653   CL 102 06/01/2024 0653   CO2 19 (L) 06/01/2024 0653   GLUCOSE 144 (H) 06/01/2024 0653   GLUCOSE 107 (H) 09/09/2013 1237   BUN 6 06/01/2024 0653   BUN 9 01/12/2023 1038   CREATININE 0.48 06/01/2024 0653   CREATININE 0.62 02/03/2014 0931   CALCIUM  8.1 (L) 06/01/2024 0653   PROT 6.2 (L) 05/30/2024 0601   PROT 7.1 01/12/2023 1038   ALBUMIN  3.7 05/30/2024 0601   ALBUMIN  4.4 01/12/2023 1038   AST 65 (H) 05/30/2024 0601   ALT 30 05/30/2024 0601   ALKPHOS 53 05/30/2024 0601   BILITOT 0.5 05/30/2024 0601   BILITOT 0.3 01/12/2023 1038   GFRNONAA >60 06/01/2024 0653   GFRAA >60 07/02/2019 1311    Lipase     Component Value Date/Time   LIPASE 15 10/17/2008 0950    Studies/Results: No results found.  Anti-infectives: Anti-infectives (From admission, onward)    Start     Dose/Rate Route Frequency Ordered Stop   05/31/24 2000  ceFAZolin  (ANCEF ) IVPB 2g/100 mL premix        2 g 200 mL/hr over 30 Minutes Intravenous Every 8 hours 05/31/24 1612 06/01/24 1443   05/31/24 1114  vancomycin  (VANCOCIN ) powder  Status:  Discontinued          As needed 05/31/24 1116 05/31/24 1353   05/31/24 1041  ceFAZolin  (ANCEF ) 2-4 GM/100ML-% IVPB       Note to Pharmacy: Ezequiel Henri: cabinet override      05/31/24 1041 05/31/24 2244   05/29/24 2200  ceFAZolin  (ANCEF ) IVPB 2g/100 mL premix        2 g 200 mL/hr over 30 Minutes Intravenous Every 8 hours 05/29/24 1818 05/30/24 1529   05/29/24 1116  vancomycin  (VANCOCIN ) powder  Status:  Discontinued          As needed 05/29/24 1117 05/29/24 1314   05/28/24 0600  cefTRIAXone  (ROCEPHIN ) 1 g in sodium chloride  0.9 % 100 mL IVPB        1 g 200 mL/hr over 30 Minutes Intravenous Every 24 hours 05/28/24 0450 05/29/24 0650   05/27/24 2256  vancomycin  (VANCOCIN ) powder  Status:  Discontinued          As needed 05/27/24 2257 05/28/24 0017   05/27/24 1915  ceFAZolin  (ANCEF ) IVPB 2g/100 mL premix        2 g 200 mL/hr over 30 Minutes Intravenous STAT 05/27/24 1911 05/27/24 2000        Assessment/Plan Jump out of window   Suspected suicide attempt - psych consulted, recommend 1:1 observation, now recommending outpatient psych follow up instead of inpatient admission. L rib fx 3/5-7 - pain control, pulm toilet  L open tib-fib fx - per Dr. Kendal, s/p ORIF L acetabulum, ex-fix L ankle 12/16, closed reduction and washout with VAC placement 12/17. S/p ORIF L tib-fib and L malleolus fx 12/19. NWB LLE.  Posterior L hip dislocation with associated acetabulum fx - per Dr. Kendal, s/p ORIF 12/17. Acute R shoulder pain - XR 12/22 showed no acute findings.  Ortho has been made aware of right shoulder pain. They plan to further assess as outpatient. Discussed with attending as well. ABL anemia - S/p 1U PRBC 12/18. Hgb stable 12/20 at 8.1 R toe pain - xray negative for acute fracture HTN - amlodipine  started   FEN - Regular diet, on stool softenters and BID Miralax ; give mag citrate 1/2 bottle today DVT - SCDs, LMWH ID - Periop ancef  completed Dispo - 6N, PT/OT.  Psych recommending outpatient follow up. Continue therapies, recommending CIR at discharge, tentative plan for CIR tomorrow 12/27  I reviewed nursing notes, last 24 h vitals and pain scores, last 48 h intake and output, last 24 h labs and trends, and last 24 h imaging results.    LOS: 11 days    Almarie GORMAN Pringle, [facility] 06/07/2024, 11:23 AM Please see Amion for pager number during day hours 7:00am-4:30pm  "

## 2024-06-07 NOTE — Discharge Summary (Incomplete)
 [facility] Discharge Summary   Patient ID: Caitlyn [name] MRN: [id number] DOB/AGE: 1980/11/06 43 y.o.  Admit date: 05/27/2024 Discharge date: 06/08/2024  Admitting Diagnosis: Jump out of window Rib fractures  Tibia fracture Fibula fracture  Hip dislocation  Discharge Diagnosis Patient Active Problem List   Diagnosis Date Noted   At risk for depression 06/04/2024   Fall from window 06/04/2024   Alcoholic intoxication with complication 06/04/2024   Dislocation of hip, posterior, left, closed (HCC) 05/28/2024   Open pilon fracture, left, type I or II, initial encounter 05/28/2024   Pelvic fracture (HCC) 05/27/2024   Type I or II open fracture of left ankle 05/27/2024   Type 2 diabetes mellitus without complication, without long-term current use of insulin  (HCC) 01/12/2023   Right hip pain 10/19/2022   Status post cesarean delivery 02/25/2014   Fetal macrosomia in pregnancy in third trimester 02/20/2014   Hypertension in pregnancy, pre-existing, antepartum 10/14/2013   Diabetes mellitus (Class B), antepartum 09/10/2013   Hx of preeclampsia, prior pregnancy, currently pregnant 08/21/2013   Previous cesarean section complicating pregnancy 08/21/2013   Hypertension 08/21/2013   Obesity complicating pregnancy in first trimester 08/21/2013   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  Procedures Dr. Kendal 05/31/24 - Procedures: CPT 27828-Open reduction internal fixation of left distal tibia and fibula fracture CPT 27766-Open reduction internal fixation of left medial malleolus fracture CPT 20694-Removal of external fixation of left ankle  Dr. Kendal 05/29/24 Procedures: CPT 27228-Open reduction internal fixation of left transverse/posterior wall acetabular fracture CPT 11012-Irrigation and debridement of left open distal tibia fracture CPT 27825-Closed reduction of left distal tibia fracture CPT  15852-Incision wound vac placement to left leg   Dr. Georgina 05/28/24   Procedure: Left hip dislocation closed reduction under anesthesia Left open pilon fracture irrigation and debridement to the level of bone at the site of the open fracture Application of uniplanar external fixator for left pilon fracture Placement of traction pin in the left distal femur  HPI: 27F s/p jump from third story window, reportedly intentionally. Patient reports being amnestic to the event and needs prompting to remember anything from yesterday. Husband at bedside reports this occurred after they had an argument.  Hospital Course:  Complete trauma workup was significant for the below injuries along with management: Jump out of window Suspected suicide attempt - psych consulted, recommend 1:1 observation, now recommending outpatient psych follow up instead of inpatient admission( outpatient psychiatry with substance use management like CDIOP or PHP). Meds: Daily Cymbalta , gabapentin  TID, melatonin QHS L rib fx 3/5-7 - pain control, pulm toilet  L open tib-fib fx - per Dr. Kendal, s/p ORIF L acetabulum, ex-fix L ankle 12/16, closed reduction and washout with VAC placement 12/17. S/p ORIF L tib-fib and L malleolus fx 12/19. NWB LLE.  Posterior L hip dislocation with associated acetabulum fx - per Dr. Kendal, s/p ORIF 12/17. Acute R shoulder pain - XR 12/22 showed no acute findings. Ortho has been made aware of right shoulder pain. They plan to further assess as outpatient. Discussed with attending as well. ABL anemia - S/p 1U PRBC 12/18. Hgb stable 12/20 at 8.1 R toe pain - xray negative for acute fracture HTN - amlodipine  started   FEN - Regular diet, on stool softenters and BID Miralax ; give mag citrate 1/2 bottle 12/26 DVT - SCDs, LMWH; recommend transitioning to DOAC 12/27 per orthopedics  ID - Periop ancef  completed  The patients diet  was advanced as tolerated. She worked with PT/OT. On 06/08/24 vitals  were stable, pain controlled, tolerating PO, and felt [facility] for [facility]. She will require outpatient follow up with orthopedics and with psychiatry at an intensive outpatient program (chemical dependency intensive outpatient program).  I have personally reviewed the patients medication history on the [place] controlled substance database. ***   Physical Exam: PE: Gen:  Alert, NAD, pleasant Card:  RRR Pulm:  Rate and effort normal Abd: ND Ext:  Splint present of LLE. Toes warm and well-perfused. Able to mobilize toes and sensation is intact. R radial 2+. Able to move all other extremities.  Psych: A&Ox3 ***   Allergies as of 06/07/2024       Reactions   Codeine Anaphylaxis, Swelling   Eye swelling, puffiness OK to take oxycodone /Percocet with no issues       Current Facility-Administered Medications (Endocrine & Metabolic):    insulin  aspart (novoLOG ) injection 0-15 Units  Current Facility-Administered Medications (Cardiovascular):    amLODipine  (NORVASC ) tablet 5 mg   hydrALAZINE  (APRESOLINE ) injection 10 mg   metoprolol  tartrate (LOPRESSOR ) injection 5 mg  Current Facility-Administered Medications (Analgesics):    acetaminophen  (TYLENOL ) tablet 1,000 mg   HYDROmorphone  (DILAUDID ) injection 0.5 mg   oxyCODONE  (Oxy IR/ROXICODONE ) immediate release tablet 5-10 mg  Current Facility-Administered Medications (Hematological):    enoxaparin  (LOVENOX ) injection 30 mg  Current Facility-Administered Medications (Other):    calcium  gluconate 2 g/ 100 mL sodium chloride  IVPB   Chlorhexidine  Gluconate Cloth 2 % PADS 6 each   docusate sodium  (COLACE) capsule 100 mg   DULoxetine  (CYMBALTA ) DR capsule 20 mg   gabapentin  (NEURONTIN ) capsule 100 mg   lidocaine  (LIDODERM ) 5 % 1 patch   melatonin tablet 5 mg   methocarbamol  (ROBAXIN ) tablet 500 mg   metoCLOPramide  (REGLAN ) tablet 5-10 mg **OR** metoCLOPramide  (REGLAN ) injection 5-10 mg   ondansetron   (ZOFRAN -ODT) disintegrating tablet 4 mg **OR** ondansetron  (ZOFRAN ) injection 4 mg   polyethylene glycol (MIRALAX  / GLYCOLAX ) packet 17 g   sodium zirconium cyclosilicate  (LOKELMA ) packet 5 g   Vitamin D  (Ergocalciferol ) (DRISDOL ) 1.25 MG (50000 UNIT) capsule 50,000 Units  No current outpatient medications on file.       Follow-up Information     Haddix, Franky SQUIBB, MD. Schedule an appointment as soon as possible for a visit in 2 week(s).   Specialty: Orthopedic Surgery Why: for splint removal Contact information: [address] [place] [address] [phone]                 Signed: Almarie Pringle, [facility] 06/07/2024, 2:41 PM

## 2024-06-07 NOTE — Plan of Care (Signed)

## 2024-06-07 NOTE — H&P (Signed)
 "   Physical Medicine and Rehabilitation Admission H&P    Chief Complaint  Patient presents with   Fall  : HPI: Caitlyn [name] 43y/o right handed female with history significant for anemia, diet-controlled diabetes mellitus, hypertension, sleep apnea not on CPAP, quit smoking 15 years ago.  Per chart review patient lives with spouse and 3 children ages 39, 66 and 39 as well as a 42 year old.  Third floor apartment.  Presented 05/27/2024 after reportedly jumping from a third story building.  EMS reported the jump was intentional (suspect suicide attempt).  Admission chemistries unremarkable except alcohol 186, urine drug screen negative, hemoglobin A1c 7.7, CO2 21, glucose 296, creatinine 1.14.  Cranial CT scan negative.  CT cervical spine negative.  CT of the chest abdomen and pelvis showed left 3rd and 5th - seventh rib fractures.  No pneumothorax.  Posterior left hip dislocation with associated displaced acetabular fracture.  Findings of left open tib-fib fracture as well as a CT left ankle showing markedly comminuted, open distal tibial fracture with displacement and intra-articular extension.  Fracture fragments along the anterior lateral talus.  Underwent ORIF left acetabular, external fixator left ankle 12/16, closed reduction and washout with VAC placement 12/17.  Status post ORIF left tibia fibula and left malleolus fracture 12/19.  Nonweightbearing left lower extremity.  Patient also underwent ORIF of posterior left hip dislocation with associated acetabular fracture 05/29/2024 per Dr. Kendal.  Hospital course acute blood loss anemia transfuse 1 unit packed red blood cells 12/18 with latest hemoglobin 8.1.  Patient was cleared to begin Lovenox  for DVT prophylaxis 05/30/2024.  Blood pressure did have some increased elevations placed on low-dose amlodipine  06/03/2024.  Psychiatry/behavioral health was consulted in regards to suspect suicide attempt and followed as directed and currently maintained on  Cymbalta .  Therapy evaluations completed due to patient decreased functional mobility was admitted for a comprehensive rehab program.   Pt reports her mood is good- but would love more lidoderm  patches- very helpful on L thigh currently.  Pain controlled with current regimen. Is getting frustrated stuck in bed so much- excited to come to therapy/Rehab.   R chest pec soreness is doing better- not resolved- is using ice.  LBM this AM and yesterday.   Review of Systems  Constitutional:  Negative for chills and fever.  HENT:  Negative for hearing loss.   Eyes:  Negative for blurred vision and double vision.  Respiratory:  Negative for cough, shortness of breath and wheezing.   Cardiovascular:  Negative for chest pain, palpitations and leg swelling.  Gastrointestinal:  Negative for constipation, heartburn, nausea and vomiting.  Genitourinary:  Negative for dysuria, flank pain and hematuria.  Musculoskeletal:  Positive for back pain, falls, joint pain and myalgias.  Skin:  Negative for rash.  Neurological: Negative.   Psychiatric/Behavioral:  The patient has insomnia.   All other systems reviewed and are negative.  Past Medical History:  Diagnosis Date   Anemia    Diabetes mellitus without complication (HCC)    GM - Diet controlled - no meds   Hypertension    PIH with pregnancy   Sleep apnea    does not use CPAP - lost weight   SVD (spontaneous vaginal delivery)    x 2   Past Surgical History:  Procedure Laterality Date   CESAREAN SECTION  03/13/2010   CESAREAN SECTION WITH BILATERAL TUBAL LIGATION N/A 02/25/2014   Procedure: CESAREAN SECTION WITH BILATERAL TUBAL LIGATION;  Surgeon: Jacob J Stinson, DO;  Location: [facility]  ORS;  Service: Obstetrics;  Laterality: N/A;   EXTERNAL FIXATION LEG Left 05/27/2024   Procedure: EXTERNAL FIXATION, LOWER EXTREMITY;  Surgeon: Georgina Ozell LABOR, MD;  Location: [facility] OR;  Service: Orthopedics;  Laterality: Left;   EXTERNAL FIXATION, ANKLE Left 05/29/2024    Procedure: ADJUSTMENT EXTERNAL FIXATION, ANKLE;  Surgeon: Kendal Franky SQUIBB, MD;  Location: [facility] OR;  Service: Orthopedics;  Laterality: Left;   HIP CLOSED REDUCTION Left 05/27/2024   Procedure: CLOSED REDUCTION, HIP AND APPLICATION TRACTION WITH PIN PLACEMENT;  Surgeon: Georgina Ozell LABOR, MD;  Location: [facility] OR;  Service: Orthopedics;  Laterality: Left;   IRRIGATION AND DEBRIDEMENT POSTERIOR HIP Left 05/27/2024   Procedure: IRRIGATION AND DEBRIDEMENT, OPEN FRACTURE;  Surgeon: Georgina Ozell LABOR, MD;  Location: [facility] OR;  Service: Orthopedics;  Laterality: Left;   OPEN REDUCTION INTERNAL FIXATION (ORIF) TIBIA/FIBULA FRACTURE Left 05/31/2024   Procedure: OPEN REDUCTION INTERNAL FIXATION (ORIF) OF LEFT DISTAL TIBIA/FIBULA FRACTURE;  Surgeon: Kendal Franky SQUIBB, MD;  Location: [facility] OR;  Service: Orthopedics;  Laterality: Left;   OPEN REDUCTION INTERNAL FIXATION ACETABULUM POSTERIOR LATERAL Left 05/29/2024   Procedure: OPEN REDUCTION INTERNAL FIXATION ACETABULUM POSTERIOR LATERAL;  Surgeon: Kendal Franky SQUIBB, MD;  Location: [facility] OR;  Service: Orthopedics;  Laterality: Left;   TOOTH EXTRACTION     History reviewed. No pertinent family history. Social History:  reports that she quit smoking about 15 years ago. Her smoking use included cigarettes. She started smoking about 26 years ago. She has a 2.5 pack-year smoking history. She has never used smokeless tobacco. She reports current alcohol use. She reports that she does not use drugs. Allergies: Allergies[1] Medications Prior to Admission  Medication Sig Dispense Refill   acetaminophen  (TYLENOL ) 500 MG tablet Take 500 mg by mouth 2 (two) times daily as needed (back pain).     ibuprofen  (ADVIL ) 200 MG tablet Take 400 mg by mouth 2 (two) times daily as needed (back pain).        Home: Home Living Family/patient expects to be discharged to:: Private residence Living Arrangements: Spouse/significant other, Children Available Help at Discharge: Family, Available  PRN/intermittently Type of Home: Apartment Home Access: Level entry Home Layout: Multi-level Bathroom Shower/Tub: Engineer, Manufacturing Systems: Standard Home Equipment: [place] - single point Additional Comments: lives on 3rd floor apartment. 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; mobility and work limited by chronic hip pain (reports she was dx with hip tear recently, though no h/o trauma to hip) and was told to use [facility] ADLs Comments: Ind with ADLs, IADLs, cooking, home mgt  Functional Status:  Mobility: Bed Mobility Overal bed mobility: Needs Assistance Bed Mobility: Supine to Sit Rolling: Max assist Supine to sit: Min assist Sit to supine: Min assist General bed mobility comments: MinA for LLE management into and out of bed Transfers Overall transfer level: Needs assistance Equipment used: None Transfers: Bed to chair/wheelchair/BSC Bed to/from chair/wheelchair/BSC transfer type:: Squat pivot Squat pivot transfers: Contact guard assist  Lateral/Scoot Transfers: Min assist, Contact guard assist, +2 safety/equipment General transfer comment: Squat pivot transfer to chair towards R with CGA Ambulation/Gait General Gait Details: pt describes significant pain/trauma to RLE prior to current situation; discussed that she will likely need wheelchair as she does not feel she can put all her weight on  her RLE    ADL: ADL Overall ADL's : Needs assistance/impaired Eating/Feeding: Independent, Sitting Grooming: Set up, Sitting Upper Body Bathing: Minimal assistance, Bed level Lower Body Bathing: Total assistance, Bed level Upper Body Dressing : Minimal assistance, Sitting Lower Body Dressing: Total assistance, Bed level Toilet Transfer  Details (indicate cue type and reason): Will need +2 A, lateral scoot to drop arm BSC Toileting- Clothing Manipulation and Hygiene: Total assistance Toileting - Clothing Manipulation Details (indicate cue type and reason): catheter General ADL Comments: Pt with improved sitting balance this date, focus of future sessions on progessing ADL engagement OOB.  Cognition: Cognition Overall Cognitive Status: Within Functional Limits for tasks assessed Arousal/Alertness: Awake/alert Orientation Level: Oriented X4 Attention: Alternating Alternating Attention: Appears intact Memory: Appears intact Awareness: Appears intact Problem Solving: Appears intact Safety/Judgment: Appears intact Cognition Arousal: Alert Behavior During Therapy: WFL for tasks assessed/performed Overall Cognitive Status: Within Functional Limits for tasks assessed  Physical Exam: Blood pressure (!) 156/111, pulse 91, temperature 98.6 F (37 C), temperature source Oral, resp. rate 16, height 5' 4 (1.626 m), weight 103.4 kg, last menstrual period 05/20/2024, SpO2 100%. Physical Exam Vitals and nursing note reviewed. Exam conducted with a chaperone present.  Constitutional:      Appearance: Normal appearance. She is obese.     Comments: Sitting up in bedside chair; her husband at bedside, bright affect, NAD  HENT:     Head: Normocephalic.     Comments: Some abrasions    Right Ear: External ear normal.     Left Ear: External ear normal.     Nose: Nose normal. No congestion.     Mouth/Throat:     Mouth: Mucous membranes are dry.     Pharynx: Oropharynx is clear. No oropharyngeal exudate.  Eyes:     General:        Right eye: No discharge.        Left eye: No discharge.     Extraocular Movements: Extraocular movements intact.  Cardiovascular:     Rate and Rhythm: Normal rate and regular rhythm.     Heart sounds: Normal heart sounds. No murmur heard.    No gallop.  Pulmonary:     Effort: Pulmonary effort is  normal. No respiratory distress.     Breath sounds: Normal breath sounds. No wheezing, rhonchi or rales.  Abdominal:     General: Bowel sounds are normal. There is no distension.     Palpations: Abdomen is soft.     Tenderness: There is no abdominal tenderness.  Musculoskeletal:     Cervical back: Neck supple.     Comments: Ue's 5/5 throughout B/L RLE 5-/5 throughout- esp HF LLE- hard to test with NWB throughout, but at least 3/5- and EHL 4/5- can lift against gravity except NT on L ankle due to cast/ACE wrap  Skin:    General: Skin is warm and dry.     Comments: IV initially R forearm- per note, removed before came to rehab  Neurological:     Comments: Patient is alert and oriented x 3 following commands and cooperative with exam Very mild numbness/tingling in L toes, but wiggles with no difficulty- are warm Ox3- following commands  Psychiatric:     Comments: Bright mood- somewhat at odds with her situation- didn't questions further with husband in room     Results for orders placed or performed during the hospital encounter of 05/27/24 (from the past 48 hours)  Glucose, capillary     Status: Abnormal  Collection Time: 06/05/24 12:05 PM  Result Value Ref Range   Glucose-Capillary 135 (H) 70 - 99 mg/dL    Comment: Glucose reference range applies only to samples taken after fasting for at least 8 hours.  Glucose, capillary     Status: Abnormal   Collection Time: 06/05/24  5:11 PM  Result Value Ref Range   Glucose-Capillary 173 (H) 70 - 99 mg/dL    Comment: Glucose reference range applies only to samples taken after fasting for at least 8 hours.  Glucose, capillary     Status: Abnormal   Collection Time: 06/05/24  9:14 PM  Result Value Ref Range   Glucose-Capillary 214 (H) 70 - 99 mg/dL    Comment: Glucose reference range applies only to samples taken after fasting for at least 8 hours.  Glucose, capillary     Status: Abnormal   Collection Time: 06/06/24  7:46 AM  Result Value  Ref Range   Glucose-Capillary 197 (H) 70 - 99 mg/dL    Comment: Glucose reference range applies only to samples taken after fasting for at least 8 hours.  Glucose, capillary     Status: Abnormal   Collection Time: 06/06/24 11:51 AM  Result Value Ref Range   Glucose-Capillary 171 (H) 70 - 99 mg/dL    Comment: Glucose reference range applies only to samples taken after fasting for at least 8 hours.  Glucose, capillary     Status: Abnormal   Collection Time: 06/06/24  5:02 PM  Result Value Ref Range   Glucose-Capillary 177 (H) 70 - 99 mg/dL    Comment: Glucose reference range applies only to samples taken after fasting for at least 8 hours.  Glucose, capillary     Status: Abnormal   Collection Time: 06/06/24  8:52 PM  Result Value Ref Range   Glucose-Capillary 135 (H) 70 - 99 mg/dL    Comment: Glucose reference range applies only to samples taken after fasting for at least 8 hours.  Glucose, capillary     Status: Abnormal   Collection Time: 06/07/24  7:47 AM  Result Value Ref Range   Glucose-Capillary 180 (H) 70 - 99 mg/dL    Comment: Glucose reference range applies only to samples taken after fasting for at least 8 hours.   No results found.    Blood pressure (!) 156/111, pulse 91, temperature 98.6 F (37 C), temperature source Oral, resp. rate 16, height 5' 4 (1.626 m), weight 103.4 kg, last menstrual period 05/20/2024, SpO2 100%.  Medical Problem List and Plan: 1. Functional deficits secondary to multitrauma after jumping from a third level building/suspect suicide attempt 05/27/2024  -patient may  shower- cover LLE  -ELOS/Goals: 10-12 days- supervision- NWB LLE 2.  Antithrombotics: -DVT/anticoagulation:  Pharmaceutical: Lovenox .  Check vascular study .Transition to Eliquis versus Xarelto at discharge x 30 days  -antiplatelet therapy: N/A 3. Pain Management: Neurontin  100 mg 3 times daily, Robaxin  as needed as well as oxycodone  5 to 10 mg every 4 hours as needed- will add  Lidoderm  3 patches 12 hrs on;12 hrs off- pain controlled 4. Mood/Behavior/Sleep: Cymbalta  20 mg daily, melatonin 5 mg nightly and (psychiatry follow-up)- suggest Neuropsych for pt without family in room.   -antipsychotic agents: N/A 5. Neuropsych/cognition: This patient is capable of making decisions on her own behalf. 6. Skin/Wound Care: Routine skin checks 7. Fluids/Electrolytes/Nutrition: Routine in and outs with follow-up chemistries 8.  Left rib fractures 3/5-7.  Pain control conservative care 9.  Left open tib-fib fracture.  Status post  ORIF left acetabulum, external fixator left ankle 12/16, closed reduction and washout with VAC placement 12/17.  Status post ORIF left tibia fibula and left malleolus fracture 12/19.  Nonweightbearing.  Follow-up Dr. Kendal 10.  Posterior left hip dislocation with associated acetabular fracture.  Status post ORIF 12/17. 11.  Acute blood loss anemia.  Follow-up CBC 12.  Hypertension.  Amlodipine  5 mg daily.  Monitor with increased mobility 13.  Constipation.  Colace 100 mg twice daily, MiraLAX  twice daily- LBM this AM 14.  Diabetes mellitus.  Hemoglobin A1c 7.7.  Currently on SSI.Needs additional meds long term   Toribio JINNY Pitch, PA-C 06/07/2024  I have personally performed a face to face diagnostic evaluation of this patient and formulated the key components of the plan.  Additionally, I have personally reviewed laboratory data, imaging studies, as well as relevant notes and concur with the physician assistant's documentation above.   The patient's status has not changed from the original H&P.  Any changes in documentation from the acute care chart have been noted above.        [1]  Allergies Allergen Reactions   Codeine Anaphylaxis and Swelling    Eye swelling, puffiness  OK to take oxycodone /Percocet with no issues   "

## 2024-06-08 ENCOUNTER — Other Ambulatory Visit: Payer: Self-pay

## 2024-06-08 ENCOUNTER — Encounter (HOSPITAL_COMMUNITY): Payer: Self-pay | Admitting: Physical Medicine and Rehabilitation

## 2024-06-08 ENCOUNTER — Inpatient Hospital Stay (HOSPITAL_COMMUNITY)
Admission: EM | Admit: 2024-06-08 | Discharge: 2024-06-18 | DRG: 560 | Disposition: A | Discharge status: Home / Self Care | Source: Intra-hospital | Attending: Physical Medicine and Rehabilitation | Admitting: Physical Medicine and Rehabilitation

## 2024-06-08 DIAGNOSIS — E66812 Obesity, class 2: Secondary | ICD-10-CM | POA: Diagnosis present

## 2024-06-08 DIAGNOSIS — G4733 Obstructive sleep apnea (adult) (pediatric): Secondary | ICD-10-CM | POA: Diagnosis present

## 2024-06-08 DIAGNOSIS — I10 Essential (primary) hypertension: Secondary | ICD-10-CM | POA: Diagnosis present

## 2024-06-08 DIAGNOSIS — S73015D Posterior dislocation of left hip, subsequent encounter: Secondary | ICD-10-CM | POA: Diagnosis not present

## 2024-06-08 DIAGNOSIS — E1165 Type 2 diabetes mellitus with hyperglycemia: Secondary | ICD-10-CM | POA: Diagnosis present

## 2024-06-08 DIAGNOSIS — S82402E Unspecified fracture of shaft of left fibula, subsequent encounter for open fracture type I or II with routine healing: Secondary | ICD-10-CM | POA: Diagnosis present

## 2024-06-08 DIAGNOSIS — X80XXXD Intentional self-harm by jumping from a high place, subsequent encounter: Secondary | ICD-10-CM | POA: Diagnosis present

## 2024-06-08 DIAGNOSIS — Z885 Allergy status to narcotic agent status: Secondary | ICD-10-CM | POA: Diagnosis not present

## 2024-06-08 DIAGNOSIS — R32 Unspecified urinary incontinence: Secondary | ICD-10-CM | POA: Diagnosis present

## 2024-06-08 DIAGNOSIS — E119 Type 2 diabetes mellitus without complications: Secondary | ICD-10-CM | POA: Diagnosis present

## 2024-06-08 DIAGNOSIS — T07XXXA Unspecified multiple injuries, initial encounter: Principal | ICD-10-CM

## 2024-06-08 DIAGNOSIS — S2242XD Multiple fractures of ribs, left side, subsequent encounter for fracture with routine healing: Secondary | ICD-10-CM | POA: Diagnosis not present

## 2024-06-08 DIAGNOSIS — F4311 Post-traumatic stress disorder, acute: Secondary | ICD-10-CM | POA: Diagnosis present

## 2024-06-08 DIAGNOSIS — K5901 Slow transit constipation: Secondary | ICD-10-CM | POA: Diagnosis present

## 2024-06-08 DIAGNOSIS — K5900 Constipation, unspecified: Secondary | ICD-10-CM | POA: Diagnosis present

## 2024-06-08 DIAGNOSIS — R739 Hyperglycemia, unspecified: Secondary | ICD-10-CM

## 2024-06-08 DIAGNOSIS — Z87891 Personal history of nicotine dependence: Secondary | ICD-10-CM | POA: Diagnosis not present

## 2024-06-08 DIAGNOSIS — Z6839 Body mass index (BMI) 39.0-39.9, adult: Secondary | ICD-10-CM

## 2024-06-08 DIAGNOSIS — F32A Depression, unspecified: Secondary | ICD-10-CM | POA: Diagnosis present

## 2024-06-08 DIAGNOSIS — D62 Acute posthemorrhagic anemia: Secondary | ICD-10-CM | POA: Diagnosis present

## 2024-06-08 DIAGNOSIS — S82302E Unspecified fracture of lower end of left tibia, subsequent encounter for open fracture type I or II with routine healing: Secondary | ICD-10-CM | POA: Diagnosis not present

## 2024-06-08 DIAGNOSIS — Z7984 Long term (current) use of oral hypoglycemic drugs: Secondary | ICD-10-CM | POA: Diagnosis not present

## 2024-06-08 DIAGNOSIS — Z79899 Other long term (current) drug therapy: Secondary | ICD-10-CM | POA: Diagnosis not present

## 2024-06-08 DIAGNOSIS — M7989 Other specified soft tissue disorders: Secondary | ICD-10-CM | POA: Diagnosis present

## 2024-06-08 DIAGNOSIS — Z794 Long term (current) use of insulin: Secondary | ICD-10-CM | POA: Diagnosis not present

## 2024-06-08 DIAGNOSIS — G4730 Sleep apnea, unspecified: Secondary | ICD-10-CM | POA: Diagnosis present

## 2024-06-08 DIAGNOSIS — R7989 Other specified abnormal findings of blood chemistry: Secondary | ICD-10-CM | POA: Diagnosis present

## 2024-06-08 LAB — CBC
HCT: 26.6 % — ABNORMAL LOW (ref 36.0–46.0)
Hemoglobin: 8.6 g/dL — ABNORMAL LOW (ref 12.0–15.0)
MCH: 26.5 pg (ref 26.0–34.0)
MCHC: 32.3 g/dL (ref 30.0–36.0)
MCV: 81.8 fL (ref 80.0–100.0)
Platelets: 500 K/uL — ABNORMAL HIGH (ref 150–400)
RBC: 3.25 MIL/uL — ABNORMAL LOW (ref 3.87–5.11)
RDW: 13.6 % (ref 11.5–15.5)
WBC: 11 K/uL — ABNORMAL HIGH (ref 4.0–10.5)
nRBC: 0.2 % (ref 0.0–0.2)

## 2024-06-08 LAB — GLUCOSE, CAPILLARY
Glucose-Capillary: 202 mg/dL — ABNORMAL HIGH (ref 70–99)
Glucose-Capillary: 217 mg/dL — ABNORMAL HIGH (ref 70–99)
Glucose-Capillary: 160 mg/dL — ABNORMAL HIGH (ref 70–99)

## 2024-06-08 LAB — CREATININE, SERUM
Creatinine, Ser: 0.64 mg/dL (ref 0.44–1.00)
GFR, Estimated: >60 mL/min

## 2024-06-08 MED ORDER — METHOCARBAMOL 500 MG PO TABS
500.0000 mg | ORAL_TABLET | Freq: Four times a day (QID) | ORAL | Status: DC | PRN
  Administered 2024-06-09 – 2024-06-14 (×8): 500 mg via ORAL
  Filled 2024-06-08 (×3): qty 1

## 2024-06-08 MED ORDER — LIDOCAINE 5 % EX PTCH
3.0000 | MEDICATED_PATCH | CUTANEOUS | Status: DC
  Administered 2024-06-09 – 2024-06-17 (×9): 3 via TRANSDERMAL
  Filled 2024-06-08 (×3): qty 3

## 2024-06-08 MED ORDER — ACETAMINOPHEN 500 MG PO TABS
1000.0000 mg | ORAL_TABLET | Freq: Four times a day (QID) | ORAL | Status: DC
  Administered 2024-06-08 – 2024-06-10 (×7): 1000 mg via ORAL
  Filled 2024-06-08 (×6): qty 2

## 2024-06-08 MED ORDER — ONDANSETRON HCL 4 MG/2ML IJ SOLN: 4.0000 mg | Freq: Four times a day (QID) | INTRAMUSCULAR | Status: DC | PRN

## 2024-06-08 MED ORDER — DOCUSATE SODIUM 100 MG PO CAPS
100.0000 mg | ORAL_CAPSULE | Freq: Two times a day (BID) | ORAL | Status: DC
  Administered 2024-06-08 – 2024-06-18 (×10): 100 mg via ORAL
  Filled 2024-06-08 (×6): qty 1

## 2024-06-08 MED ORDER — ENOXAPARIN SODIUM 30 MG/0.3ML IJ SOSY
30.0000 mg | PREFILLED_SYRINGE | Freq: Two times a day (BID) | INTRAMUSCULAR | Status: DC
  Administered 2024-06-08 – 2024-06-18 (×20): 30 mg via SUBCUTANEOUS
  Filled 2024-06-08 (×6): qty 0.3

## 2024-06-08 MED ORDER — AMLODIPINE BESYLATE 5 MG PO TABS
5.0000 mg | ORAL_TABLET | Freq: Every day | ORAL | Status: DC
  Administered 2024-06-09 – 2024-06-18 (×10): 5 mg via ORAL
  Filled 2024-06-08 (×3): qty 1

## 2024-06-08 MED ORDER — MELATONIN 5 MG PO TABS
5.0000 mg | ORAL_TABLET | Freq: Every day | ORAL | Status: DC
  Administered 2024-06-08 – 2024-06-17 (×9): 5 mg via ORAL
  Filled 2024-06-08 (×3): qty 1

## 2024-06-08 MED ORDER — DULOXETINE HCL 20 MG PO CPEP
20.0000 mg | ORAL_CAPSULE | Freq: Every day | ORAL | Status: DC
  Administered 2024-06-09 – 2024-06-18 (×10): 20 mg via ORAL
  Filled 2024-06-08 (×3): qty 1

## 2024-06-08 MED ORDER — VITAMIN D (ERGOCALCIFEROL) 1.25 MG (50000 UNIT) PO CAPS
50000.0000 [IU] | ORAL_CAPSULE | ORAL | Status: DC
  Administered 2024-06-13: 50000 [IU] via ORAL

## 2024-06-08 MED ORDER — OXYCODONE HCL 5 MG PO TABS
5.0000 mg | ORAL_TABLET | ORAL | Status: DC | PRN
  Administered 2024-06-09 – 2024-06-11 (×10): 10 mg via ORAL
  Filled 2024-06-08 (×5): qty 2

## 2024-06-08 MED ORDER — INSULIN ASPART 100 UNIT/ML IJ SOLN
0.0000 [IU] | Freq: Three times a day (TID) | INTRAMUSCULAR | Status: DC
  Administered 2024-06-09: 3 [IU] via SUBCUTANEOUS
  Administered 2024-06-09 (×2): 5 [IU] via SUBCUTANEOUS
  Administered 2024-06-10: 2 [IU] via SUBCUTANEOUS
  Administered 2024-06-10: 3 [IU] via SUBCUTANEOUS
  Administered 2024-06-10 – 2024-06-11 (×3): 5 [IU] via SUBCUTANEOUS
  Administered 2024-06-11: 3 [IU] via SUBCUTANEOUS
  Administered 2024-06-12 (×3): 5 [IU] via SUBCUTANEOUS
  Administered 2024-06-13 (×2): 3 [IU] via SUBCUTANEOUS
  Administered 2024-06-14: 2 [IU] via SUBCUTANEOUS
  Administered 2024-06-14 – 2024-06-15 (×3): 3 [IU] via SUBCUTANEOUS
  Administered 2024-06-15: 2 [IU] via SUBCUTANEOUS
  Administered 2024-06-15 – 2024-06-16 (×2): 5 [IU] via SUBCUTANEOUS
  Administered 2024-06-16: 2 [IU] via SUBCUTANEOUS
  Administered 2024-06-16: 3 [IU] via SUBCUTANEOUS
  Administered 2024-06-17 (×2): 2 [IU] via SUBCUTANEOUS
  Administered 2024-06-17 – 2024-06-18 (×2): 3 [IU] via SUBCUTANEOUS
  Filled 2024-06-08 (×2): qty 2
  Filled 2024-06-08 (×2): qty 3
  Filled 2024-06-08: qty 5
  Filled 2024-06-08: qty 3
  Filled 2024-06-08: qty 5

## 2024-06-08 MED ORDER — LIDOCAINE 5 % EX PTCH: 1.0000 | MEDICATED_PATCH | CUTANEOUS | Status: DC

## 2024-06-08 MED ORDER — GABAPENTIN 100 MG PO CAPS
100.0000 mg | ORAL_CAPSULE | Freq: Three times a day (TID) | ORAL | Status: DC
  Administered 2024-06-08 – 2024-06-11 (×8): 100 mg via ORAL
  Filled 2024-06-08 (×4): qty 1

## 2024-06-08 MED ORDER — ONDANSETRON 4 MG PO TBDP: 4.0000 mg | ORAL_TABLET | Freq: Four times a day (QID) | ORAL | Status: DC | PRN

## 2024-06-08 MED ORDER — POLYETHYLENE GLYCOL 3350 17 G PO PACK
17.0000 g | PACK | Freq: Two times a day (BID) | ORAL | Status: DC
  Administered 2024-06-08 – 2024-06-18 (×8): 17 g via ORAL
  Filled 2024-06-08 (×6): qty 1

## 2024-06-08 MED ORDER — ENOXAPARIN SODIUM 30 MG/0.3ML IJ SOSY: 30.0000 mg | PREFILLED_SYRINGE | Freq: Two times a day (BID) | INTRAMUSCULAR | Status: DC

## 2024-06-08 NOTE — H&P (Signed)
 "     Physical Medicine and Rehabilitation Admission H&P        Chief Complaint  Patient presents with   Fall  : HPI: Caitlyn [name] 43y/o right handed female with history significant for anemia, diet-controlled diabetes mellitus, hypertension, sleep apnea not on CPAP, quit smoking 15 years ago.  Per chart review patient lives with spouse and 3 children ages 2, 25 and 23 as well as a 20 year old.  Third floor apartment.  Presented 05/27/2024 after reportedly jumping from a third story building.  EMS reported the jump was intentional (suspect suicide attempt).  Admission chemistries unremarkable except alcohol 186, urine drug screen negative, hemoglobin A1c 7.7, CO2 21, glucose 296, creatinine 1.14.  Cranial CT scan negative.  CT cervical spine negative.  CT of the chest abdomen and pelvis showed left 3rd and 5th - seventh rib fractures.  No pneumothorax.  Posterior left hip dislocation with associated displaced acetabular fracture.  Findings of left open tib-fib fracture as well as a CT left ankle showing markedly comminuted, open distal tibial fracture with displacement and intra-articular extension.  Fracture fragments along the anterior lateral talus.  Underwent ORIF left acetabular, external fixator left ankle 12/16, closed reduction and washout with VAC placement 12/17.  Status post ORIF left tibia fibula and left malleolus fracture 12/19.  Nonweightbearing left lower extremity.  Patient also underwent ORIF of posterior left hip dislocation with associated acetabular fracture 05/29/2024 per Dr. Kendal.  Hospital course acute blood loss anemia transfuse 1 unit packed red blood cells 12/18 with latest hemoglobin 8.1.  Patient was cleared to begin Lovenox  for DVT prophylaxis 05/30/2024.  Blood pressure did have some increased elevations placed on low-dose amlodipine  06/03/2024.  Psychiatry/behavioral health was consulted in regards to suspect suicide attempt and followed as directed and currently  maintained on Cymbalta .  Therapy evaluations completed due to patient decreased functional mobility was admitted for a comprehensive rehab program.     Pt reports her mood is good- but would love more lidoderm  patches- very helpful on L thigh currently.  Pain controlled with current regimen. Is getting frustrated stuck in bed so much- excited to come to therapy/Rehab.    R chest pec soreness is doing better- not resolved- is using ice.   LBM this AM and yesterday.    Review of Systems  Constitutional:  Negative for chills and fever.  HENT:  Negative for hearing loss.   Eyes:  Negative for blurred vision and double vision.  Respiratory:  Negative for cough, shortness of breath and wheezing.   Cardiovascular:  Negative for chest pain, palpitations and leg swelling.  Gastrointestinal:  Negative for constipation, heartburn, nausea and vomiting.  Genitourinary:  Negative for dysuria, flank pain and hematuria.  Musculoskeletal:  Positive for back pain, falls, joint pain and myalgias.  Skin:  Negative for rash.  Neurological: Negative.   Psychiatric/Behavioral:  The patient has insomnia.   All other systems reviewed and are negative.      Past Medical History:  Diagnosis Date   Anemia     Diabetes mellitus without complication (HCC)      GM - Diet controlled - no meds   Hypertension      PIH with pregnancy   Sleep apnea      does not use CPAP - lost weight   SVD (spontaneous vaginal delivery)      x 2             Past Surgical History:  Procedure Laterality Date  CESAREAN SECTION   03/13/2010   CESAREAN SECTION WITH BILATERAL TUBAL LIGATION N/A 02/25/2014    Procedure: CESAREAN SECTION WITH BILATERAL TUBAL LIGATION;  Surgeon: Lang JINNY Peel, DO;  Location: [facility];  Service: Obstetrics;  Laterality: N/A;   EXTERNAL FIXATION LEG Left 05/27/2024    Procedure: EXTERNAL FIXATION, LOWER EXTREMITY;  Surgeon: Georgina Ozell LABOR, MD;  Location: [facility];  Service: Orthopedics;  Laterality:  Left;   EXTERNAL FIXATION, ANKLE Left 05/29/2024    Procedure: ADJUSTMENT EXTERNAL FIXATION, ANKLE;  Surgeon: Kendal Franky SQUIBB, MD;  Location: [facility];  Service: Orthopedics;  Laterality: Left;   HIP CLOSED REDUCTION Left 05/27/2024    Procedure: CLOSED REDUCTION, HIP AND APPLICATION TRACTION WITH PIN PLACEMENT;  Surgeon: Georgina Ozell LABOR, MD;  Location: [facility];  Service: Orthopedics;  Laterality: Left;   IRRIGATION AND DEBRIDEMENT POSTERIOR HIP Left 05/27/2024    Procedure: IRRIGATION AND DEBRIDEMENT, OPEN FRACTURE;  Surgeon: Georgina Ozell LABOR, MD;  Location: [facility];  Service: Orthopedics;  Laterality: Left;   OPEN REDUCTION INTERNAL FIXATION (ORIF) TIBIA/FIBULA FRACTURE Left 05/31/2024    Procedure: OPEN REDUCTION INTERNAL FIXATION (ORIF) OF LEFT DISTAL TIBIA/FIBULA FRACTURE;  Surgeon: Kendal Franky SQUIBB, MD;  Location: [facility];  Service: Orthopedics;  Laterality: Left;   OPEN REDUCTION INTERNAL FIXATION ACETABULUM POSTERIOR LATERAL Left 05/29/2024    Procedure: OPEN REDUCTION INTERNAL FIXATION ACETABULUM POSTERIOR LATERAL;  Surgeon: Kendal Franky SQUIBB, MD;  Location: [facility];  Service: Orthopedics;  Laterality: Left;   TOOTH EXTRACTION            History reviewed. No pertinent family history.     Social History:  reports that she quit smoking about 15 years ago. Her smoking use included cigarettes. She started smoking about 26 years ago. She has a 2.5 pack-year smoking history. She has never used smokeless tobacco. She reports current alcohol use. She reports that she does not use drugs. Allergies: [Allergies]  [Allergies]      Allergen Reactions   Codeine Anaphylaxis and Swelling      Eye swelling, puffiness   OK to take oxycodone /Percocet with no issues         Medications Prior to Admission  Medication Sig Dispense Refill   acetaminophen  (TYLENOL ) 500 MG tablet Take 500 mg by mouth 2 (two) times daily as needed (back pain).       ibuprofen  (ADVIL ) 200 MG tablet Take 400 mg by mouth 2 (two)  times daily as needed (back pain).                  Home: Home Living Family/patient expects to be discharged to:: Private residence Living Arrangements: Spouse/significant other, Children Available Help at Discharge: Family, Available PRN/intermittently Type of Home: Apartment Home Access: Level entry Home Layout: Multi-level Bathroom Shower/Tub: Engineer, Manufacturing Systems: Standard Home Equipment: [place] - single point Additional Comments: lives on 3rd floor apartment. 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; mobility and work limited by chronic hip pain (reports she was dx with hip tear recently, though no h/o trauma to hip) and was told to use [facility] ADLs Comments: Ind with ADLs, IADLs, cooking, home mgt   Functional Status:  Mobility: Bed Mobility Overal bed mobility: Needs Assistance Bed Mobility: Supine to Sit Rolling: Max assist  Supine to sit: Min assist Sit to supine: Min assist General bed mobility comments: MinA for LLE management into and out of bed Transfers Overall transfer level: Needs assistance Equipment used: None Transfers: Bed to chair/wheelchair/BSC Bed to/from chair/wheelchair/BSC transfer type:: Squat pivot Squat pivot transfers: Contact guard assist  Lateral/Scoot Transfers: Min assist, Contact guard assist, +2 safety/equipment General transfer comment: Squat pivot transfer to chair towards R with CGA Ambulation/Gait General Gait Details: pt describes significant pain/trauma to RLE prior to current situation; discussed that she will likely need wheelchair as she does not feel she can put all her weight on her RLE   ADL: ADL Overall ADL's : Needs assistance/impaired Eating/Feeding: Independent,  Sitting Grooming: Set up, Sitting Upper Body Bathing: Minimal assistance, Bed level Lower Body Bathing: Total assistance, Bed level Upper Body Dressing : Minimal assistance, Sitting Lower Body Dressing: Total assistance, Bed level Toilet Transfer Details (indicate cue type and reason): Will need +2 A, lateral scoot to drop arm BSC Toileting- Clothing Manipulation and Hygiene: Total assistance Toileting - Clothing Manipulation Details (indicate cue type and reason): catheter General ADL Comments: Pt with improved sitting balance this date, focus of future sessions on progessing ADL engagement OOB.   Cognition: Cognition Overall Cognitive Status: Within Functional Limits for tasks assessed Arousal/Alertness: Awake/alert Orientation Level: Oriented X4 Attention: Alternating Alternating Attention: Appears intact Memory: Appears intact Awareness: Appears intact Problem Solving: Appears intact Safety/Judgment: Appears intact Cognition Arousal: Alert Behavior During Therapy: WFL for tasks assessed/performed Overall Cognitive Status: Within Functional Limits for tasks assessed   Physical Exam: Blood pressure (!) 156/111, pulse 91, temperature 98.6 F (37 C), temperature source Oral, resp. rate 16, height 5' 4 (1.626 m), weight 103.4 kg, last menstrual period 05/20/2024, SpO2 100%. Physical Exam Vitals and nursing note reviewed. Exam conducted with a chaperone present.  Constitutional:      Appearance: Normal appearance. She is obese.     Comments: Sitting up in bedside chair; her husband at bedside, bright affect, NAD  HENT:     Head: Normocephalic.     Comments: Some abrasions    Right Ear: External ear normal.     Left Ear: External ear normal.     Nose: Nose normal. No congestion.     Mouth/Throat:     Mouth: Mucous membranes are dry.     Pharynx: Oropharynx is clear. No oropharyngeal exudate.  Eyes:     General:        Right eye: No discharge.        Left eye: No  discharge.     Extraocular Movements: Extraocular movements intact.  Cardiovascular:     Rate and Rhythm: Normal rate and regular rhythm.     Heart sounds: Normal heart sounds. No murmur heard.    No gallop.  Pulmonary:     Effort: Pulmonary effort is normal. No respiratory distress.     Breath sounds: Normal breath sounds. No wheezing, rhonchi or rales.  Abdominal:     General: Bowel sounds are normal. There is no distension.     Palpations: Abdomen is soft.     Tenderness: There is no abdominal tenderness.  Musculoskeletal:     Cervical back: Neck supple.     Comments: Ue's 5/5 throughout B/L RLE 5-/5 throughout- esp HF LLE- hard to test with NWB throughout, but at least 3/5- and EHL 4/5- can lift against gravity except NT on L ankle due to cast/ACE wrap  Skin:    General: Skin is warm and dry.  Comments: IV initially R forearm- per note, removed before came to rehab  Neurological:     Comments: Patient is alert and oriented x 3 following commands and cooperative with exam Very mild numbness/tingling in L toes, but wiggles with no difficulty- are warm Ox3- following commands  Psychiatric:     Comments: Bright mood- somewhat at odds with her situation- didn't questions further with husband in room       Lab Results Last 48 Hours        Results for orders placed or performed during the hospital encounter of 05/27/24 (from the past 48 hours)  Glucose, capillary     Status: Abnormal    Collection Time: 06/05/24 12:05 PM  Result Value Ref Range    Glucose-Capillary 135 (H) 70 - 99 mg/dL      Comment: Glucose reference range applies only to samples taken after fasting for at least 8 hours.  Glucose, capillary     Status: Abnormal    Collection Time: 06/05/24  5:11 PM  Result Value Ref Range    Glucose-Capillary 173 (H) 70 - 99 mg/dL      Comment: Glucose reference range applies only to samples taken after fasting for at least 8 hours.  Glucose, capillary     Status: Abnormal     Collection Time: 06/05/24  9:14 PM  Result Value Ref Range    Glucose-Capillary 214 (H) 70 - 99 mg/dL      Comment: Glucose reference range applies only to samples taken after fasting for at least 8 hours.  Glucose, capillary     Status: Abnormal    Collection Time: 06/06/24  7:46 AM  Result Value Ref Range    Glucose-Capillary 197 (H) 70 - 99 mg/dL      Comment: Glucose reference range applies only to samples taken after fasting for at least 8 hours.  Glucose, capillary     Status: Abnormal    Collection Time: 06/06/24 11:51 AM  Result Value Ref Range    Glucose-Capillary 171 (H) 70 - 99 mg/dL      Comment: Glucose reference range applies only to samples taken after fasting for at least 8 hours.  Glucose, capillary     Status: Abnormal    Collection Time: 06/06/24  5:02 PM  Result Value Ref Range    Glucose-Capillary 177 (H) 70 - 99 mg/dL      Comment: Glucose reference range applies only to samples taken after fasting for at least 8 hours.  Glucose, capillary     Status: Abnormal    Collection Time: 06/06/24  8:52 PM  Result Value Ref Range    Glucose-Capillary 135 (H) 70 - 99 mg/dL      Comment: Glucose reference range applies only to samples taken after fasting for at least 8 hours.  Glucose, capillary     Status: Abnormal    Collection Time: 06/07/24  7:47 AM  Result Value Ref Range    Glucose-Capillary 180 (H) 70 - 99 mg/dL      Comment: Glucose reference range applies only to samples taken after fasting for at least 8 hours.      Imaging Results (Last 48 hours)  No results found.         Blood pressure (!) 156/111, pulse 91, temperature 98.6 F (37 C), temperature source Oral, resp. rate 16, height 5' 4 (1.626 m), weight 103.4 kg, last menstrual period 05/20/2024, SpO2 100%.   Medical Problem List and Plan: 1. Functional deficits secondary  to multitrauma after jumping from a third level building/suspect suicide attempt 05/27/2024             -patient may  shower-  cover LLE             -ELOS/Goals: 10-12 days- supervision- NWB LLE 2.  Antithrombotics: -DVT/anticoagulation:  Pharmaceutical: Lovenox .  Check vascular study .Transition to Eliquis versus Xarelto at discharge x 30 days             -antiplatelet therapy: N/A 3. Pain Management: Neurontin  100 mg 3 times daily, Robaxin  as needed as well as oxycodone  5 to 10 mg every 4 hours as needed- will add Lidoderm  3 patches 12 hrs on;12 hrs off- pain controlled 4. Mood/Behavior/Sleep: Cymbalta  20 mg daily, melatonin 5 mg nightly and (psychiatry follow-up)- suggest Neuropsych for pt without family in room.              -antipsychotic agents: N/A 5. Neuropsych/cognition: This patient is capable of making decisions on her own behalf. 6. Skin/Wound Care: Routine skin checks 7. Fluids/Electrolytes/Nutrition: Routine in and outs with follow-up chemistries 8.  Left rib fractures 3/5-7.  Pain control conservative care 9.  Left open tib-fib fracture.  Status post ORIF left acetabulum, external fixator left ankle 12/16, closed reduction and washout with VAC placement 12/17.  Status post ORIF left tibia fibula and left malleolus fracture 12/19.  Nonweightbearing.  Follow-up Dr. Kendal 10.  Posterior left hip dislocation with associated acetabular fracture.  Status post ORIF 12/17. 11.  Acute blood loss anemia.  Follow-up CBC 12.  Hypertension.  Amlodipine  5 mg daily.  Monitor with increased mobility 13.  Constipation.  Colace 100 mg twice daily, MiraLAX  twice daily- LBM this AM 14.  Diabetes mellitus.  Hemoglobin A1c 7.7.  Currently on SSI.Needs additional meds long term     Toribio JINNY Pitch, PA-C 06/07/2024   I have personally performed a face to face diagnostic evaluation of this patient and formulated the key components of the plan.  Additionally, I have personally reviewed laboratory data, imaging studies, as well as relevant notes and concur with the physician assistant's documentation above.   The patient's  status has not changed from the original H&P.  Any changes in documentation from the acute care chart have been noted above.       "

## 2024-06-08 NOTE — Plan of Care (Signed)
  Problem: Coping: Goal: Ability to adjust to condition or change in health will improve Outcome: Progressing   Problem: Fluid Volume: Goal: Ability to maintain a balanced intake and output will improve Outcome: Progressing   Problem: Health Behavior/Discharge Planning: Goal: Ability to identify and utilize available resources and services will improve Outcome: Progressing   Problem: Education: Goal: Knowledge of General Education information will improve Description: Including pain rating scale, medication(s)/side effects and non-pharmacologic comfort measures Outcome: Progressing   Problem: Health Behavior/Discharge Planning: Goal: Ability to manage health-related needs will improve Outcome: Progressing

## 2024-06-08 NOTE — Progress Notes (Signed)
 "   Assessment & Plan: Jump out of window  Suspected suicide attempt  - psych consulted, recommend 1:1 observation, now recommending outpatient psych follow up instead of inpatient admission. L rib fx 3/5-7  - pain control, pulm toilet  L open tib-fib fx  - per Dr. Kendal, s/p ORIF L acetabulum, ex-fix L ankle 12/16, closed reduction and washout with VAC placement 12/17. S/p ORIF L tib-fib and L malleolus fx 12/19. NWB LLE.  Posterior L hip dislocation with associated acetabulum fx  - per Dr. Kendal, s/p ORIF 12/17. Acute R shoulder pain  - XR 12/22 showed no acute findings. Ortho has been made aware of right shoulder pain. They plan to further assess as outpatient. Discussed with attending as well. ABL anemia  - S/p 1U PRBC 12/18. Hgb stable 12/20 at 8.1 R toe pain  - xray negative for acute fracture HTN  - amlodipine  started   FEN - Regular diet, on stool softener DVT - SCDs, LMWH ID - none Dispo - 6N, PT/OT.  Psych recommending outpatient follow up. Continue therapies, recommending CIR at discharge.  Tentative plan for CIR 12/27 if bed available.        Krystal Spinner, MD [facility] practice Office: [phone]        Chief Complaint: Fall from window  Subjective: Patient in bed, husband and nurse at bedside.  No complaints.  Awaiting transfer to CIR.  Objective: Vital signs in last 24 hours: Temp:  [98.2 F (36.8 C)-98.9 F (37.2 C)] 98.2 F (36.8 C) (12/27 0706) Pulse Rate:  [90-95] 93 (12/27 0706) Resp:  [16-17] 16 (12/27 0706) BP: (130-164)/(80-111) 143/93 (12/27 0930) SpO2:  [92 %-100 %] 94 % (12/27 0706) Last BM Date : 06/07/24  Intake/Output from previous day: 12/26 0701 - 12/27 0700 In: 100 [P.O.:100] Out: -  Intake/Output this shift: No intake/output data recorded.  Physical Exam: HEENT - sclerae clear, mucous membranes moist Abdomen - soft without distension, non-tender Ext - ice pack on right shoulder  Lab Results:   No results for input(s): WBC, HGB, HCT, PLT in the last 72 hours. BMET No results for input(s): NA, K, CL, CO2, GLUCOSE, BUN, CREATININE, CALCIUM  in the last 72 hours. PT/INR No results for input(s): LABPROT, INR in the last 72 hours. Comprehensive Metabolic Panel:    Component Value Date/Time   NA 136 06/01/2024 0653   NA 137 05/30/2024 0601   NA 135 01/12/2023 1038   NA 132 (L) 10/14/2022 1446   K 3.2 (L) 06/01/2024 0653   K 3.5 05/30/2024 0601   CL 102 06/01/2024 0653   CL 102 05/30/2024 0601   CO2 19 (L) 06/01/2024 0653   CO2 27 05/30/2024 0601   BUN 6 06/01/2024 0653   BUN <5 (L) 05/30/2024 0601   BUN 9 01/12/2023 1038   BUN 13 10/14/2022 1446   CREATININE 0.48 06/01/2024 0653   CREATININE 0.56 05/30/2024 0601   CREATININE 0.62 02/03/2014 0931   CREATININE 0.54 09/09/2013 1237   GLUCOSE 144 (H) 06/01/2024 0653   GLUCOSE 155 (H) 05/30/2024 0601   GLUCOSE 107 (H) 09/09/2013 1237   CALCIUM  8.1 (L) 06/01/2024 0653   CALCIUM  8.8 (L) 05/30/2024 0601   AST 65 (H) 05/30/2024 0601   AST 116 (H) 05/27/2024 1910   ALT 30 05/30/2024 0601   ALT 49 (H) 05/27/2024 1910   ALKPHOS 53 05/30/2024 0601   ALKPHOS 42 05/27/2024 1910   BILITOT 0.5 05/30/2024 0601   BILITOT 1.0 05/27/2024  1910   BILITOT 0.3 01/12/2023 1038   BILITOT 0.3 10/14/2022 1446   PROT 6.2 (L) 05/30/2024 0601   PROT 6.5 05/27/2024 1910   PROT 7.1 01/12/2023 1038   PROT 7.5 10/14/2022 1446   ALBUMIN  3.7 05/30/2024 0601   ALBUMIN  3.7 05/27/2024 1910   ALBUMIN  4.4 01/12/2023 1038   ALBUMIN  4.5 10/14/2022 1446    Studies/Results: No results found.    Krystal Spinner 06/08/2024  Patient ID: Caitlyn [name], female   DOB: 1981-03-23, 43 y.o.   MRN: [id number]  "

## 2024-06-08 NOTE — Discharge Summary (Signed)
 "   Patient ID: Caitlyn [name] [id number] [birth year] 43 y.o.  Admit date: 05/27/2024 Discharge date: 06/08/2024  Admitting Diagnosis: Jump out of window Suspected suicide attempt  L rib fx 3/5-7  L open tib-fib fx  Posterior L hip dislocation with associated acetabulum fx  Discharge Diagnosis Jump out of window Suspected suicide attempt  L rib fx 3/5-7  L open tib-fib fx  Posterior L hip dislocation with associated acetabulum fx  Acute R shoulder pain  ABL anemia  R toe pain  HTN    Consultants Ortho Psych  HPI: 45F s/p jump from third story window, reportedly intentionally. Patient reports being amnestic to the event and needs prompting to remember anything from yesterday. Husband at bedside reports this occurred after they had an argument.   Procedures Dr. Ozell Ada - 05/28/24 Left hip dislocation closed reduction under anesthesia Left open pilon fracture irrigation and debridement to the level of bone at the site of the open fracture Application of uniplanar external fixator for left pilon fracture Placement of traction pin in the left distal femur  Dr. Franky Haddix - 05/29/24 Open reduction internal fixation of left transverse/posterior wall acetabular fracture Irrigation and debridement of left open distal tibia fracture Closed reduction of left distal tibia fracture Incision wound vac placement to left leg   Dr. Franky Haddix - 06/10/24 Open reduction internal fixation of left distal tibia and fibula fracture Open reduction internal fixation of left medial malleolus fracture Removal of external fixation of left ankle   Hospital Course:  Patient presented as above after Jump out of window. She was found to have below injuries.  Suspected suicide attempt  - psych consulted, recommend 1:1 observation, now recommending outpatient psych follow up instead of inpatient admission.  L rib fx 3/5-7  - pain control, pulm toilet   L open tib-fib fx  - per  Dr. Kendal, s/p ORIF L acetabulum, ex-fix L ankle 12/16, closed reduction and washout with VAC placement 12/17. S/p ORIF L tib-fib and L malleolus fx 12/19. NWB LLE.   Posterior L hip dislocation with associated acetabulum fx  - per Dr. Kendal, s/p ORIF 12/17.  Acute R shoulder pain  - XR 12/22 showed no acute findings. Ortho has been made aware of right shoulder pain. They plan to further assess as outpatient.  ABL anemia  - S/p 1U PRBC 12/18. Hgb stable 12/20 at 8.1  R toe pain  - xray negative for acute fracture  HTN  - amlodipine  started  Patient worked with therapies during admission and recommended for CIR. On 12/27 she was felt stable for d/c to CIR. Please see MD's progress note for more details on day of discharge.    Allergies as of 06/08/2024       Reactions   Codeine Anaphylaxis, Swelling   Eye swelling, puffiness OK to take oxycodone /Percocet with no issues     Med Rec per CIR. Ortho recommending oral DOAC at d/c for DVT prophylaxis.          Follow-up Information     Haddix, Franky SQUIBB, MD. Schedule an appointment as soon as possible for a visit in 2 week(s).   Specialty: Orthopedic Surgery Why: for splint removal Contact information: [address] [place] [address] [phone]         Oley Bascom RAMAN, NP Follow up.   Specialties: Pulmonary Disease, Endocrinology Contact information: [address] [place] [address] [phone]  Signed: Ozell CHRISTELLA Shaper, [facility] 06/08/2024, 12:11 PM Please see Amion for pager number during day hours 7:00am-4:30pm  "

## 2024-06-08 NOTE — Plan of Care (Signed)
" °  Problem: Consults Goal: RH GENERAL PATIENT EDUCATION Description: See Patient Education module for education specifics. Outcome: Progressing Goal: Skin Care Protocol Initiated - if Braden Score 18 or less Description: If consults are not indicated, leave blank or document N/A Outcome: Progressing Goal: Nutrition Consult-if indicated Outcome: Progressing Goal: Diabetes Guidelines if Diabetic/Glucose > 140 Description: If diabetic or lab glucose is > 140 mg/dl - Initiate Diabetes/Hyperglycemia Guidelines & Document Interventions  Outcome: Progressing   Problem: RH BOWEL ELIMINATION Goal: RH STG MANAGE BOWEL WITH ASSISTANCE Description: STG Manage Bowel with Assistance. Outcome: Progressing Goal: RH STG MANAGE BOWEL W/MEDICATION W/ASSISTANCE Description: STG Manage Bowel with Medication with Assistance. Outcome: Progressing   Problem: RH BLADDER ELIMINATION Goal: RH STG MANAGE BLADDER WITH ASSISTANCE Description: STG Manage Bladder With Assistance Outcome: Progressing Goal: RH STG MANAGE BLADDER WITH MEDICATION WITH ASSISTANCE Description: STG Manage Bladder With Medication With Assistance. Outcome: Progressing Goal: RH STG MANAGE BLADDER WITH EQUIPMENT WITH ASSISTANCE Description: STG Manage Bladder With Equipment With Assistance Outcome: Progressing   Problem: RH SKIN INTEGRITY Goal: RH STG SKIN FREE OF INFECTION/BREAKDOWN Outcome: Progressing Goal: RH STG MAINTAIN SKIN INTEGRITY WITH ASSISTANCE Description: STG Maintain Skin Integrity With Assistance. Outcome: Progressing Goal: RH STG ABLE TO PERFORM INCISION/WOUND CARE W/ASSISTANCE Description: STG Able To Perform Incision/Wound Care With Assistance. Outcome: Progressing   Problem: RH SAFETY Goal: RH STG ADHERE TO SAFETY PRECAUTIONS W/ASSISTANCE/DEVICE Description: STG Adhere to Safety Precautions With Assistance/Device. Outcome: Progressing Goal: RH STG DECREASED RISK OF FALL WITH ASSISTANCE Description: STG  Decreased Risk of Fall With Assistance. Outcome: Progressing   Problem: RH PAIN MANAGEMENT Goal: RH STG PAIN MANAGED AT OR BELOW PT'S PAIN GOAL Outcome: Progressing   Problem: RH KNOWLEDGE DEFICIT GENERAL Goal: RH STG INCREASE KNOWLEDGE OF SELF CARE AFTER HOSPITALIZATION Outcome: Progressing   Problem: RH Vision Goal: RH LTG Vision (Specify) Outcome: Progressing   Problem: RH Pre-functional/Other (Specify) Goal: RH LTG Pre-functional (Specify) Outcome: Progressing Goal: RH LTG Interdisciplinary (Specify) 1 Description: RH LTG Interdisciplinary (Specify)1 Outcome: Progressing Goal: RH LTG Interdisciplinary (Specify) 2 Description: RH LTG Interdisciplinary (Specify) 2  Outcome: Progressing   Problem: Education: Goal: Verbalization of understanding the information provided (i.e., activity precautions, restrictions, etc) will improve Outcome: Progressing Goal: Individualized Educational Video(s) Outcome: Progressing   Problem: Activity: Goal: Ability to ambulate and perform ADLs will improve Outcome: Progressing   Problem: Clinical Measurements: Goal: Postoperative complications will be avoided or minimized Outcome: Progressing   Problem: Self-Concept: Goal: Ability to maintain and perform role responsibilities to the fullest extent possible will improve Outcome: Progressing   Problem: Pain Management: Goal: Pain level will decrease Outcome: Progressing   "

## 2024-06-08 NOTE — Progress Notes (Signed)
 Report given to Zack. IV removed at patient's request. Patient and belongings wheeled to CIR.

## 2024-06-08 NOTE — Progress Notes (Signed)
 Inpatient Rehab Admissions Coordinator:  There is a bed available for pt in CIR today. Dr. Polly and Dr. Eletha aware and in agreement. Pt, pt's husband, NSG and TOC made aware.   Tinnie Yvone Cohens, MS, CCC-SLP Admissions Coordinator [phone]

## 2024-06-09 ENCOUNTER — Encounter (HOSPITAL_COMMUNITY): Payer: Self-pay | Admitting: Orthopedic Surgery

## 2024-06-09 DIAGNOSIS — K5901 Slow transit constipation: Secondary | ICD-10-CM

## 2024-06-09 LAB — GLUCOSE, CAPILLARY
Glucose-Capillary: 202 mg/dL — ABNORMAL HIGH (ref 70–99)
Glucose-Capillary: 168 mg/dL — ABNORMAL HIGH (ref 70–99)
Glucose-Capillary: 201 mg/dL — ABNORMAL HIGH (ref 70–99)
Glucose-Capillary: 171 mg/dL — ABNORMAL HIGH (ref 70–99)

## 2024-06-09 MED ORDER — GUAIFENESIN-DM 100-10 MG/5ML PO SYRP: 10.0000 mL | ORAL_SOLUTION | Freq: Four times a day (QID) | ORAL | Status: DC | PRN

## 2024-06-09 MED ORDER — FLEET ENEMA RE ENEM: 1.0000 | ENEMA | Freq: Every day | RECTAL | Status: DC | PRN

## 2024-06-09 MED ORDER — ALUM & MAG HYDROXIDE-SIMETH 200-200-20 MG/5ML PO SUSP: 30.0000 mL | ORAL | Status: DC | PRN

## 2024-06-09 MED ORDER — POLYETHYLENE GLYCOL 3350 17 G PO PACK: 17.0000 g | PACK | Freq: Every day | ORAL | Status: DC | PRN

## 2024-06-09 NOTE — Discharge Summary (Signed)
 Physician Discharge Summary  Patient ID: Caitlyn [name] MRN: [id number] DOB/AGE: September 17, 1980 43 y.o.  Admit date: 06/08/2024 Discharge date: 06/18/2024  Discharge Diagnoses:  Principal Problem:   Multiple traumatic injuries Active Problems:   Critical polytrauma   Acute posttraumatic stress disorder   Hyperglycemia   Slow transit constipation DVT prophylaxis Pain management Mood stabilization Left rib fractures 3/5-7 Left open tibia fibula fracture Posterior left hip dislocation with associated acetabular fracture Acute blood loss anemia Hypertension Diabetes mellitus Constipation OSA  Discharged Condition: Stable  Significant Diagnostic Studies: VAS US  LOWER EXTREMITY VENOUS (DVT) Result Date: 06/11/2024  Lower Venous DVT Study Patient Name:  Caitlyn [name]  Date of Exam:   06/10/2024 Medical Rec #: [id number]       Accession #:    [id number] Date of Birth: 08-04-1980       Patient Gender: F Patient Age:   16 years Exam Location:  [facility] Procedure:      VAS US  LOWER EXTREMITY VENOUS (DVT) Referring Phys: TORIBIO PITCH --------------------------------------------------------------------------------  Indications: Swelling. Other Indications: Rehab patient. Limitations: Orthopaedic appliance and bandages. Comparison Study: No previous exams Performing Technologist: Jody Hill RVT, RDMS  Examination Guidelines: A complete evaluation includes B-mode imaging, spectral Doppler, color Doppler, and power Doppler as needed of all accessible portions of each vessel. Bilateral testing is considered an integral part of a complete examination. Limited examinations for reoccurring indications may be performed as noted. The reflux portion of the exam is performed with the patient in reverse Trendelenburg.  +---------+---------------+---------+-----------+----------+--------------+ RIGHT    CompressibilityPhasicitySpontaneityPropertiesThrombus Aging  +---------+---------------+---------+-----------+----------+--------------+ CFV      Full           Yes      Yes                                 +---------+---------------+---------+-----------+----------+--------------+ SFJ      Full                                                        +---------+---------------+---------+-----------+----------+--------------+ FV Prox  Full           Yes      Yes                                 +---------+---------------+---------+-----------+----------+--------------+ FV Mid   Full           Yes      Yes                                 +---------+---------------+---------+-----------+----------+--------------+ FV DistalFull           Yes      Yes                                 +---------+---------------+---------+-----------+----------+--------------+ PFV      Full                                                        +---------+---------------+---------+-----------+----------+--------------+  POP      Full           Yes      Yes                                 +---------+---------------+---------+-----------+----------+--------------+ PTV      Full                                                        +---------+---------------+---------+-----------+----------+--------------+ PERO     Full                                                        +---------+---------------+---------+-----------+----------+--------------+   +---------+---------------+---------+-----------+----------+-------------------+ LEFT     CompressibilityPhasicitySpontaneityPropertiesThrombus Aging      +---------+---------------+---------+-----------+----------+-------------------+ CFV      Full           Yes      Yes                                      +---------+---------------+---------+-----------+----------+-------------------+ SFJ      Full                                                              +---------+---------------+---------+-----------+----------+-------------------+ FV Prox  Full           Yes      Yes                                      +---------+---------------+---------+-----------+----------+-------------------+ FV Mid   Full           Yes      Yes                                      +---------+---------------+---------+-----------+----------+-------------------+ FV DistalFull           Yes      Yes                                      +---------+---------------+---------+-----------+----------+-------------------+ PFV      Full                                                             +---------+---------------+---------+-----------+----------+-------------------+ POP      Full           Yes      Yes                                      +---------+---------------+---------+-----------+----------+-------------------+  PTV                                                   unable to visualize +---------+---------------+---------+-----------+----------+-------------------+ PERO                                                  unable to visualize +---------+---------------+---------+-----------+----------+-------------------+   Left Technical Findings: Not visualized segments include peroneal and posterior tibial veins, due to cast placement.   Summary: BILATERAL: -No evidence of popliteal cyst, bilaterally. RIGHT: - There is no evidence of deep vein thrombosis in the lower extremity.  LEFT: - There is no evidence of deep vein thrombosis in the lower extremity. However, portions of this examination were limited- see technologist comments above.  - Ultrasound characteristics of enlarged lymph nodes noted in the groin.  *See table(s) above for measurements and observations. Electronically signed by Fonda Rim on 06/11/2024 at 9:23:10 AM.    Final    DG Shoulder Right Port Result Date: 06/03/2024 EXAM: 1 VIEW(S) XRAY OF THE RIGHT SHOULDER  06/03/2024 01:04:00 PM COMPARISON: None available. CLINICAL HISTORY: Right shoulder pain FINDINGS: BONES AND JOINTS: Glenohumeral joint is normally aligned. No acute fracture. No malalignment. [facility] joint is unremarkable. SOFT TISSUES: No abnormal calcifications. Visualized lung is unremarkable. IMPRESSION: 1. No acute findings. Electronically signed by: Selinda Blue MD 06/03/2024 01:53 PM EST RP Workstation: [id number]   DG Shoulder Left Port Result Date: 06/02/2024 CLINICAL DATA:  Left shoulder pain EXAM: LEFT SHOULDER COMPARISON:  05/27/2024 FINDINGS: No acute fracture or dislocation is noted in the left shoulder joint. No soft tissue abnormality is noted. Mildly displaced left fifth rib fracture is seen without pneumothorax similar to that noted on prior CT. IMPRESSION: No acute abnormality in the left shoulder. Left fifth rib fracture similar to prior CT examination. Electronically Signed   By: Oneil Devonshire M.D.   On: 06/02/2024 23:52   DG Foot Complete Right Result Date: 05/31/2024 CLINICAL DATA:  Great toe pain. EXAM: RIGHT FOOT COMPLETE - 3+ VIEW COMPARISON:  None Available. FINDINGS: No acute fracture or dislocation. Mild degenerative change of the great toe metatarsal phalangeal joint. No destructive or erosive change. Mild generalized soft tissue edema. IMPRESSION: Mild degenerative change of the great toe metatarsophalangeal joint. No acute osseous abnormality. Electronically Signed   By: Andrea Gasman M.D.   On: 05/31/2024 16:55   DG Ankle Complete Left Result Date: 05/31/2024 CLINICAL DATA:  Fracture, postop. EXAM: LEFT ANKLE COMPLETE - 3+ VIEW COMPARISON:  Preoperative imaging FINDINGS: Lateral plate and screw fixation of distal fibular fracture. Medial and lateral plate and screw fixation of comminuted distal tibial fracture. Ghost tracks in the tibia from prior external fixator. Postoperative air and edema in the soft tissues. Overlying splint material limits osseous and soft tissue  fine detail. IMPRESSION: ORIF of distal tibial and fibular fractures. Electronically Signed   By: Andrea Gasman M.D.   On: 05/31/2024 16:53   DG Ankle Complete Left Result Date: 05/31/2024 CLINICAL DATA:  Elective surgery. EXAM: LEFT ANKLE COMPLETE - 3+ VIEW COMPARISON:  Radiograph 05/29/2024 FINDINGS: Twelve fluoroscopic spot views of the ankle submitted from the operating room. Lateral plate and screw fixation of distal fibular fracture. Medial  and lateral plate and screw fixation of comminuted distal tibial fracture. Previous external fixator has been removed. Fluoroscopy time 271.3 seconds. Dose 4.93 mGy. IMPRESSION: Intraoperative fluoroscopy during ORIF of distal tibial and fibular fractures. Electronically Signed   By: Andrea Gasman M.D.   On: 05/31/2024 16:52   DG C-Arm 1-60 Min-No Report Result Date: 05/31/2024 Fluoroscopy was utilized by the requesting physician.  No radiographic interpretation.   DG C-Arm 1-60 Min-No Report Result Date: 05/31/2024 Fluoroscopy was utilized by the requesting physician.  No radiographic interpretation.   DG Pelvis Comp Min 3V Result Date: 05/29/2024 EXAM: 3 VIEW(S) XRAY OF THE PELVIS 05/29/2024 04:36:00 PM COMPARISON: Comparison with 05/28/2024. CLINICAL HISTORY: 243000 Acetabular fracture (HCC) 243000 Acetabular fracture (HCC) FINDINGS: BONES AND JOINTS: Interval postoperative changes with realignment of the left acetabulum and hip with plate and screw fixation of the acetabulum. Near-anatomic alignment is suggested. Surgical hardware appears intact. SI joints and symphysis pubis are not displaced. SOFT TISSUES: Soft tissue gas is present, likely resulting from recent surgery. IMPRESSION: 1. Interval postoperative changes with realignment of the left acetabulum and hip with plate and screw fixation of the acetabulum, with near-anatomic alignment suggested and intact hardware. 2. Soft tissue gas, likely resulting from recent surgery. Electronically  signed by: Elsie Gravely MD 05/29/2024 05:23 PM EST RP Workstation: [id number]   DG Ankle Complete Left Result Date: 05/29/2024 EXAM: 3 OR MORE VIEW(S) XRAY OF THE LEFT ANKLE 05/29/2024 04:36:00 PM CLINICAL HISTORY: 243000 Acetabular fracture (HCC) 243000 Acetabular fracture (HCC) COMPARISON: Comparison with 05/28/2024. FINDINGS: BONES AND JOINTS: Postoperative changes with external fixator through the calcaneus. Multiple comminuted fractures were demonstrated in the visualized distal left tibia centered on the tibial metaphysis but with fracture lines extending to the articular surface. There is superior overriding and lateral displacement of distal fracture fragments. There is mild posterior displacement of distal fracture fragments. Transverse fractures of the distal fibula with greater than 1 shaft width lateral displacement and about 2 cm lateral overriding of the distal fracture fragment. SOFT TISSUES: Diffuse soft tissue swelling. IMPRESSION: 1. Multiple comminuted fractures of the distal left tibia with superior overriding, lateral displacement, and mild posterior displacement of distal fracture fragments. 2. Transverse fractures of the distal fibula with greater than 1 shaft width lateral displacement and about 2 cm lateral overriding of the distal fracture fragment. 3. Postoperative changes with external fixator through the calcaneus. 4. Diffuse soft tissue swelling. Electronically signed by: Elsie Gravely MD 05/29/2024 05:20 PM EST RP Workstation: [id number]   DG Pelvis Comp Min 3V Result Date: 05/29/2024 EXAM: 5 INTRAOPERATIVE FLUOROSCOPIC VIEWS OF THE LEFT HIP 05/29/2024 01:10:34 PM COMPARISON: None available. CLINICAL HISTORY: 461500 Elective surgery 461500 Elective surgery 461500 FINDINGS: BONES AND JOINTS: Fixation plates are seen along the acetabulum. The alignment is grossly anatomic. SOFT TISSUES: The soft tissues are unremarkable. TECHNICAL DETAILS: Fluoroscopy time 47.3 seconds.  Fluoroscopy dose 4.95 microgray. IMPRESSION: 1. Intraoperative fluoroscopic views of the left hip demonstrate fixation plates along the acetabulum with grossly anatomic alignment. Electronically signed by: Greig Pique MD 05/29/2024 05:18 PM EST RP Workstation: [id number]   DG Ankle Complete Left Result Date: 05/29/2024 EXAM: 3 OR MORE VIEW(S) XRAY OF THE LEFT ANKLE 05/29/2024 01:10:34 PM CLINICAL HISTORY: 461500 Elective surgery 461500 Elective surgery 461500 COMPARISON: Comparison with 05/28/2024. FINDINGS: FLUOROSCOPY: Intraoperative fluoroscopy was utilized for surgical control purposes. Fluoroscopy time: 47.3 seconds. Dose: 4.95 mGy. 2 spot fluoroscopic images obtained. Fluoroscopic images are demonstrating internal/external fixation of comminuted fractures of the distal tibia and fibula. IMPRESSION:  1. Intraoperative fluoroscopy for surgical control purposes, demonstrating internal/external fixation of comminuted fractures of the distal tibia and fibula. Electronically signed by: Elsie Gravely MD 05/29/2024 05:18 PM EST RP Workstation: [id number]   DG C-Arm 1-60 Min-No Report Result Date: 05/29/2024 Fluoroscopy was utilized by the requesting physician.  No radiographic interpretation.   DG C-Arm 1-60 Min-No Report Result Date: 05/29/2024 Fluoroscopy was utilized by the requesting physician.  No radiographic interpretation.   DG C-Arm 1-60 Min-No Report Result Date: 05/29/2024 Fluoroscopy was utilized by the requesting physician.  No radiographic interpretation.   DG HIP UNILAT WITH PELVIS 1V LEFT Result Date: 05/28/2024 EXAM: 1 VIEW(S) XRAY OF THE LEFT HIP 05/28/2024 06:34:00 PM COMPARISON: None available. CLINICAL HISTORY: Pain FINDINGS: BONES AND JOINTS: Superolateral acetabular fracture. The femoral head is now well seated within the acetabulum. SOFT TISSUES: The soft tissues are unremarkable. IMPRESSION: 1. Superolateral left acetabular fracture. 2. No dislocation. . Electronically  signed by: Greig Pique MD 05/28/2024 06:43 PM EST RP Workstation: [id number]   DG Pelvis Comp Min 3V Result Date: 05/28/2024 CLINICAL DATA:  Follow-up acetabular fracture. The patient is currently in traction, making positioning difficult. EXAM: JUDET PELVIS - 3+ VIEW COMPARISON:  05/27/2024 radiographs and CT. FINDINGS: The bone detail is not as well visualized on the current images due to overlying bowel-gas. The previously demonstrated displaced acetabular fracture and posterior/superior dislocation of the femoral head have not changed significantly. No additional fractures are visualized. IMPRESSION: Grossly unchanged displaced acetabular fracture and posterior/superior dislocation of the femoral head. Electronically Signed   By: Elspeth Bathe M.D.   On: 05/28/2024 16:46   DG HIP UNILAT WITH PELVIS 2-3 VIEWS LEFT Result Date: 05/28/2024 EXAM: SINGLE VIEW XRAY OF THE LEFT HIP 05/27/2024 11:55:00 PM Obtained portably via CR radiography. COMPARISON: 05/27/2024. CLINICAL HISTORY: Surgery, elective [id number]. FINDINGS: BONES AND JOINTS: Interval reduction of the left hip dislocation. Persistent displacement fracture fragment of the superolateral aspect of the left acetabulum is again noted. SOFT TISSUES: The soft tissues are unremarkable. IMPRESSION: 1. Interval reduction of the left hip dislocation. 2. Persistent displaced fracture fragment of the superolateral left acetabulum. Electronically signed by: Waddell Calk MD 05/28/2024 06:40 AM EST RP Workstation: [id number]   DG Tibia/Fibula Left Result Date: 05/28/2024 EXAM: MULTIPLE SEQUENTIAL IMAGES OBTAINED PORTABLE INTRAOPERATIVE C-ARM RADIOGRAPHS OF THE LEFT TIBIA AND FIBULA 05/27/2024 11:55:00 PM COMPARISON: None available. CLINICAL HISTORY: 886218 Surgery, elective [id number] Surgery, elective [phone] FINDINGS: BONES AND JOINTS: Comminuted distal tibial and fibular fractures are present. An external fixation device is in place across these fractures. SOFT  TISSUES: The soft tissues are unremarkable. IMPRESSION: 1. Comminuted distal tibial and fibular fractures with external fixator placement. Electronically signed by: Waddell Calk MD 05/28/2024 06:36 AM EST RP Workstation: [id number]   DG Fluoro Rm 1-60 Min - No Report Result Date: 05/28/2024 Fluoroscopy was utilized by the requesting physician.  No radiographic interpretation.   DG C-Arm 1-60 Min-No Report Result Date: 05/28/2024 Fluoroscopy was utilized by the requesting physician.  No radiographic interpretation.   DG Fluoro Rm 1-60 Min - No Report Result Date: 05/28/2024 Fluoroscopy was utilized by the requesting physician.  No radiographic interpretation.   DG Ankle 2 Views Left Result Date: 05/28/2024 EXAM: 2 VIEW(S) XRAY OF THE LEFT ANKLE 05/28/2024 12:47:00 AM CLINICAL HISTORY: Post-operative state COMPARISON: CT left ankle dated 05/27/2024. FINDINGS: BONES AND JOINTS: External fixation device in place. Markedly comminuted fractures of distal tibia and fibula, mildly displaced, with improved alignment. Widening of tibiotalar joint space, likely postprocedural. SOFT TISSUES:  Soft tissue swelling. IMPRESSION: 1. Markedly comminuted distal tibial and fibular fractures, mildly displaced, with improved alignment. 2. External fixation device in place. Widened tibiotalar joint space, likely postprocedural. Electronically signed by: Pinkie Pebbles MD 05/28/2024 12:51 AM EST RP Workstation: [id number]   CT Ankle Left Wo Contrast Result Date: 05/27/2024 EXAM: CT LEFT ANKLE, WITHOUT IV CONTRAST 05/27/2024 08:01:33 PM TECHNIQUE: Axial images were acquired through the left ankle without IV contrast. Reformatted images were reviewed. Automated exposure control, iterative reconstruction, and/or weight based adjustment of the mA/kV was utilized to reduce the radiation dose to as low as reasonably achievable. COMPARISON: None provided. CLINICAL HISTORY: Ankle trauma, fracture, xray done (Age >= 5y)  FINDINGS: BONES: Distal fibular fracture with 1 shaft width lateral displacement and 2.5 cm overriding (coronal image 126). Markedly comminuted distal tibial fracture with approximately 1 shaft width lateral displacement of the ankle relative to the tibial shaft. Associated intraarticular extension with multiple fracture lines extending to the tibial plafond. Additional fracture fragments along the anterolateral talus (coronal image 96), favored to be related to the distal tibia. JOINTS: Displacement of the ankle relative to the tibial shaft (1 shaft width lateral displacement). Intraarticular extension of tibial fracture lines to the tibial plafond. SOFT TISSUES: Soft tissue gas with subcutaneous radiodensities along the medial aspect of the ankle (axial image 378), suggesting open fracture. IMPRESSION: 1. Markedly comminuted, open distal tibial fracture with displacement and intra-articular extension, as above. 2. Fracture fragments along the anterolateral talus, donor site unclear, favored to be related to the distal tibia. 3. Displaced, overriding distal fibular fracture, as above. Electronically signed by: Pinkie Pebbles MD 05/27/2024 08:24 PM EST RP Workstation: [id number]   CT HEAD WO CONTRAST Result Date: 05/27/2024 EXAM: CT HEAD WITHOUT 05/27/2024 08:01:33 PM TECHNIQUE: CT of the head was performed without the administration of intravenous contrast. Automated exposure control, iterative reconstruction, and/or weight based adjustment of the mA/kV was utilized to reduce the radiation dose to as low as reasonably achievable. COMPARISON: None available. CLINICAL HISTORY: Head trauma, moderate-severe FINDINGS: BRAIN AND VENTRICLES: No acute intracranial hemorrhage. No mass effect or midline shift. No extra-axial fluid collection. No evidence of acute infarct. No hydrocephalus. ORBITS: No acute abnormality. SINUSES AND MASTOIDS: No acute abnormality. SOFT TISSUES AND SKULL: No acute skull fracture. No acute  soft tissue abnormality. IMPRESSION: 1. No acute intracranial abnormality. Electronically signed by: Pinkie Pebbles MD 05/27/2024 08:18 PM EST RP Workstation: [id number]   CT CERVICAL SPINE WO CONTRAST Result Date: 05/27/2024 EXAM: CT CERVICAL SPINE WITHOUT CONTRAST 05/27/2024 08:01:33 PM TECHNIQUE: CT of the cervical spine was performed without the administration of intravenous contrast. Multiplanar reformatted images are provided for review. Automated exposure control, iterative reconstruction, and/or weight based adjustment of the mA/kV was utilized to reduce the radiation dose to as low as reasonably achievable. COMPARISON: None available. CLINICAL HISTORY: Polytrauma, blunt FINDINGS: BONES AND ALIGNMENT: No acute fracture or traumatic malalignment. DEGENERATIVE CHANGES: No significant degenerative changes. SOFT TISSUES: No prevertebral soft tissue swelling. IMPRESSION: 1. No significant abnormality Electronically signed by: Pinkie Pebbles MD 05/27/2024 08:17 PM EST RP Workstation: [id number]   CT CHEST ABDOMEN PELVIS W CONTRAST Result Date: 05/27/2024 EXAM: CT CHEST, ABDOMEN AND PELVIS WITH CONTRAST 05/27/2024 08:01:33 PM TECHNIQUE: CT of the chest, abdomen and pelvis was performed with the administration of 75 mL of intravenous iohexol  (OMNIPAQUE ) 350 MG/ML injection. Multiplanar reformatted images are provided for review. Automated exposure control, iterative reconstruction, and/or weight based adjustment of the mA/kV was utilized to reduce the radiation dose to  as low as reasonably achievable. COMPARISON: None available. CLINICAL HISTORY: Polytrauma, blunt. FINDINGS: CHEST: MEDIASTINUM AND LYMPH NODES: Heart and pericardium are unremarkable. The central airways are clear. No mediastinal, hilar or axillary lymphadenopathy. LUNGS AND PLEURA: Mild dependent bibasilar atelectasis. No focal consolidation or pulmonary edema. No pleural effusion or pneumothorax. ABDOMEN AND PELVIS: LIVER: The liver is  unremarkable. GALLBLADDER AND BILE DUCTS: Gallbladder is unremarkable. No biliary ductal dilatation. SPLEEN: No acute abnormality. PANCREAS: No acute abnormality. ADRENAL GLANDS: No acute abnormality. KIDNEYS, URETERS AND BLADDER: Mildly distended bladder. No stones in the kidneys or ureters. No hydronephrosis. No perinephric or periureteral stranding. GI AND BOWEL: Stomach demonstrates no acute abnormality. Normal appendix (image 74). There is no bowel obstruction. REPRODUCTIVE ORGANS: The uterus is within normal limits. PERITONEUM AND RETROPERITONEUM: No ascites. No free air. VASCULATURE: Aorta is normal in caliber. ABDOMINAL AND PELVIS LYMPH NODES: No lymphadenopathy. BONES AND SOFT TISSUES: Mildly displaced left lateral third and fifth rib fractures. Nondisplaced left anterolateral 6th and 7th rib fractures. Posterior left hip dislocation with superiorly displaced fracture fragment (coronal image 105), which is favored to be related to an acetabular fracture. No focal soft tissue abnormality. IMPRESSION: 1. Left 3rd, and 5th-7th rib fractures. No pneumothorax. 2. Posterior left hip dislocation with associated displaced acetabular fracture. Electronically signed by: Pinkie Pebbles MD 05/27/2024 08:16 PM EST RP Workstation: [id number]   DG Tibia/Fibula Left Port Result Date: 05/27/2024 EXAM:  2 VIEW(S) XRAY OF THE LEFT TIBIA AND FIBULA 05/27/2024 07:18:00 PM COMPARISON: None available. CLINICAL HISTORY: Fall from building. FINDINGS: BONES AND JOINTS: Markedly comminuted, displaced, impacted distal tibia and fibula fractures. Apex medial angulation of the dominant fracture fragments. Apex medial angulation of the distal tibia and fibula with lateral displacement of the distal fracture fragment. Lateral displacement of the talus in relation to the tibia. Lateral rotation of the talus. SOFT TISSUES: Subcutaneous soft tissue edema. IMPRESSION: 1. Markedly comminuted, displaced, impacted distal tibia and fibula  fractures with apex medial angulation and lateral displacement of the distal fracture fragment. Electronically signed by: Oneil Devonshire MD 05/27/2024 07:42 PM EST RP Workstation: [id number]   DG Pelvis Portable Result Date: 05/27/2024 EXAM: 1 or 2 VIEW(S) XRAY OF THE PELVIS 05/27/2024 07:18:00 PM COMPARISON: None available. CLINICAL HISTORY: Clinical history is fall from building FINDINGS: BONES AND JOINTS: Displaced fracture of the superolateral aspect of the left acetabulum is noted. The femoral head is dislocated and superiorly displaced. Overlapping bony structures make evaluation of the femoral head somewhat limited. This will be better evaluated on the upcoming CT examination. No other fracture is noted. SOFT TISSUES: The soft tissues are unremarkable. IMPRESSION: 1. Displaced fracture of the superolateral left acetabulum with associated superiorly displaced femoral head dislocation. 2. Evaluation of the femoral head is limited by overlapping bony structures; this will be better evaluated on the upcoming CT of the chest, abdomen, and pelvis Electronically signed by: Oneil Devonshire MD 05/27/2024 07:40 PM EST RP Workstation: [id number]   DG Chest Port 1 View Result Date: 05/27/2024 EXAM: 1 VIEW(S) XRAY OF THE CHEST 05/27/2024 07:18:00 PM COMPARISON: None available. CLINICAL HISTORY: Trauma FINDINGS: LUNGS AND PLEURA: Low lung volumes with bronchovascular crowding. No focal pulmonary opacity. No pleural effusion. No pneumothorax. HEART AND MEDIASTINUM: Enlarged cardiomediastinal silhouette, likely projectional. BONES AND SOFT TISSUES: No acute osseous abnormality. IMPRESSION: 1. No acute cardiopulmonary process. 2. Low lung volumes with bronchovascular crowding. 3. Enlarged cardiomediastinal silhouette, likely projectional. Electronically signed by: Oneil Devonshire MD 05/27/2024 07:37 PM EST RP Workstation: [id number]  Labs:  Basic Metabolic Panel: Recent Labs  Lab 06/14/24 0539 06/14/24 1405  06/17/24 0611  NA  --  138 136  K  --  4.4 4.8  CL  --  101 101  CO2  --  28 27  GLUCOSE  --  124* 166*  BUN  --  7 8  CREATININE 0.43* 0.54 0.50  CALCIUM   --  9.8 9.5    CBC: Recent Labs  Lab 06/13/24 0547 06/17/24 0611  WBC 6.5 5.6  HGB 8.6* 8.7*  HCT 28.1* 27.7*  MCV 86.5 83.9  PLT 443* 454*    CBG: Recent Labs  Lab 06/16/24 2200 06/17/24 0544 06/17/24 1130 06/17/24 1623 06/17/24 2055  GLUCAP 240* 166* 123* 142* 209*    Brief HPI:   Caitlyn [name] is a 43 y.o. right-handed female with history significant for anemia, diet-controlled diabetes mellitus, hypertension, sleep apnea not on CPAP, quit smoking 15 years ago.  Per chart review patient lives with spouse and  children ages 52, 19 and 52 as well as a 42 year old.  Third floor apartment.  Presented 05/27/2024 after reportedly jumping from a third story building.  EMS reported the jump was intentional (suspect suicide attempt).  Admission chemistries unremarkable except alcohol 186 urine drug screen negative hemoglobin A1c 7.7 glucose 296 creatinine 1.14.  Cranial CT scan negative.  CT cervical spine negative.  CT of the chest abdomen and pelvis showed left 3rd and 5th-seventh rib fractures.  No pneumothorax.  Posterior left hip dislocation with associated displaced acetabular fracture.  Findings of left open tibia fibula fracture as well as a CT left ankle showing markedly comminuted, open distal tibial fracture with displacement and intra-articular extension.  Fracture fragments along the anterior lateral talus.  Underwent ORIF left acetabular, external fixator left ankle 12/16, closed reduction and washout with VAC placement 12/17.  Status post ORIF left tibia fibula and left malleolus fracture 12/19.  Nonweightbearing left lower extremity.  Patient also underwent ORIF posterior left hip dislocation with associated acetabular fracture 05/29/2024 per Dr. Kendal.  Hospital course acute blood loss anemia transfuse 1 unit packed  red blood cells 12/18 with latest hemoglobin 8.1.  Patient was cleared to begin Lovenox  for DVT prophylaxis 05/30/2024.  Blood pressure did have some increased elevations placed on low-dose amlodipine  06/03/2024.  Psychiatry/behavioral health consulted in regards to suicide attempt and followed as directed and currently maintained on Cymbalta .  Therapy evaluations completed due to patient decreased functional mobility was admitted for a comprehensive rehab program.   Hospital Course: Caitlyn [name] was admitted to rehab 06/08/2024 for inpatient therapies to consist of PT, ST and OT at least three hours five days a week. Past admission physiatrist, therapy team and rehab RN have worked together to provide customized collaborative inpatient rehab.  Pertaining to patient's multitrauma after jumping from a third level building/suspect suicide attempt 05/27/2024.  Patient sustained multiple left rib fractures with conservative care.  Left open tibia fibula fracture with ORIF left acetabulum external fixator left ankle 12/16 followed by closed reduction and washout with VAC placement 12/17 with ORIF left tibia fibula and left malleolus fracture 12/19.  Nonweightbearing follow-up orthopedic service Dr. Kendal.  Neurovascular sensation intact.  Posterior left hip dislocation with associated acetabular fracture ORIF 12/17.  Maintained on Lovenox  for DVT prophylaxis venous Doppler studies negative transition to Eliquis  at time of discharge x 30 days for DVT prophylaxis.  Pain management with use of Neurontin  scheduled titrated as needed with lidocaine  patches as well as  oxycodone  for breakthrough pain and Robaxin  for muscle spasms.  Mood stabilization suspect multitrauma related to possible suicide attempt followed by psychiatry services as well as neuropsychology continued on Cymbalta  mood remained upbeat attending full therapies.  Hospital course acute blood loss anemia follow-up CBC.  Blood pressure monitored with the  addition of Norvasc  5 mg daily.  Blood sugars overall controlled hemoglobin A1c 7.7 diabetic teaching follow-up outpatient with PCP and currently maintained on Glucotrol  5 mg daily.  Bouts of constipation resolved with laxative assistance.   Blood pressures were monitored on TID basis and remained controlled and monitored  Diabetes has been monitored with ac/hs CBG checks and SSI was use prn for tighter BS control.    Rehab course: During patient's stay in rehab weekly team conferences were held to monitor patient's progress, set goals and discuss barriers to discharge. At admission, patient required minimal assist contact-guard lateral scoot transfers minimal assist sit to supine  He/She  has had improvement in activity tolerance, balance, postural control as well as ability to compensate for deficits. He/She has had improvement in functional use RUE/LUE  and RLE/LLE as well as improvement in awareness.  Sessions with emphasis on functional mobility/transfers, generalized strengthening and endurance as well as dynamic standing balance and coordination.  Donned right shoe with supervision performed wheelchair mobility bilateral upper extremities and supervision.  Stood with rolling walker light minimal assist ambulate 10 feet rolling walker minimal assist to the mat.  Working with a ascending and descending bumping up stairs maintaining weightbearing precautions.  During ADLs contact-guard assist stand pivot rolling walker.  Patient participated with dynamic standing balance and endurance tasks to promote independence and safety during ADLs and functional mobility placing and retrieving cones from top of the long mirror contact-guard sit to stand and rolling walker that again to remove.  Contact-guard needed for dynamic standing.  Full family teaching completed plan discharge to home       Disposition:  Discharge disposition: 01-Home or Self Care        Diet: Diabetic diet  Special  Instructions: No driving smoking or alcohol   Nonweightbearing left lower extremity  Patient will follow-up Dr. Kendal for removal of sutures  Medications at discharge. 1.  Eliquis  2.5 mg twice daily x 30 days 2.  Tylenol  as needed 3.  Norvasc  5 mg p.o. daily 4.  Cymbalta  20 mg p.o. daily 5.  Neurontin  200 mg p.o. 3 times daily 6.  Lidocaine  patches changes directed 7.  Melatonin 5 mg p.o. nightly 8.  Robaxin  1000 mg every 6 hours as needed muscle spasms 9.  Oxycodone  5 to 10 mg every 4 hours as needed pain 10.  MiraLAX  daily hold for loose stools 11.  Vitamin D  50,000 units every 7 days 12.  Colace 100 mg twice daily 13.  Glucotrol  5 mg daily  30-35 minutes were spent completing discharge summary and discharge planning  Discharge Instructions     Ambulatory referral to Behavioral Health   Complete by: As directed    Evaluate and treat follow-up possible suicide attempt/coping   Ambulatory referral to Physical Medicine Rehab   Complete by: As directed    Moderate complexity follow-up 1 month multitrauma        Follow-up Information     Raulkar, Sven SQUIBB, MD Follow up.   Specialty: Physical Medicine and Rehabilitation Why: Office to call for appointment Contact information: [address] [place] [address] [phone]  Haddix, Franky SQUIBB, MD Follow up.   Specialty: Orthopedic Surgery Why: Call for appointment Contact information: [address] [place] [address] [phone]         Oley Bascom RAMAN, NP Follow up.   Specialties: Pulmonary Disease, Endocrinology Why: Call for appointment Contact information: [address] [place] [address] [phone]         Fredia Dorothe HERO, MD Follow up.   Specialty: Psychiatry Why: Call for appointment Contact information: [address] [place] [address] [phone]                 Signed: Toribio PARAS Khambrel Amsden 06/18/2024, 4:30 AM

## 2024-06-09 NOTE — Progress Notes (Signed)
 Physical Therapy Assessment and Plan  Patient Details  Name: Caitlyn [name] MRN: [id number] Date of Birth: 11/01/1980  PT Diagnosis: Abnormal posture, Difficulty walking, and Muscle weakness Rehab Potential: Good ELOS: 10-12 days   Today's Date: 06/09/2024 PT Individual Time: 1000-1115; 1259-159 PT Individual Time Calculation (min): 75 min  ; 60 min  Hospital Problem: Principal Problem:   Multiple traumatic injuries Active Problems:   Critical polytrauma   Past Medical History:  Past Medical History:  Diagnosis Date   Anemia    Diabetes mellitus without complication (HCC)    GM - Diet controlled - no meds   Hypertension    PIH with pregnancy   Sleep apnea    does not use CPAP - lost weight   SVD (spontaneous vaginal delivery)    x 2   Past Surgical History:  Past Surgical History:  Procedure Laterality Date   CESAREAN SECTION  03/13/2010   CESAREAN SECTION WITH BILATERAL TUBAL LIGATION N/A 02/25/2014   Procedure: CESAREAN SECTION WITH BILATERAL TUBAL LIGATION;  Surgeon: Lang JINNY Peel, DO;  Location: [facility];  Service: Obstetrics;  Laterality: N/A;   EXTERNAL FIXATION LEG Left 05/27/2024   Procedure: EXTERNAL FIXATION, LOWER EXTREMITY;  Surgeon: Georgina Ozell LABOR, MD;  Location: [facility];  Service: Orthopedics;  Laterality: Left;   EXTERNAL FIXATION, ANKLE Left 05/29/2024   Procedure: ADJUSTMENT EXTERNAL FIXATION, ANKLE;  Surgeon: Kendal Franky SQUIBB, MD;  Location: [facility];  Service: Orthopedics;  Laterality: Left;   HIP CLOSED REDUCTION Left 05/27/2024   Procedure: CLOSED REDUCTION, HIP AND APPLICATION TRACTION WITH PIN PLACEMENT;  Surgeon: Georgina Ozell LABOR, MD;  Location: [facility];  Service: Orthopedics;  Laterality: Left;   IRRIGATION AND DEBRIDEMENT POSTERIOR HIP Left 05/27/2024   Procedure: IRRIGATION AND DEBRIDEMENT, OPEN FRACTURE;  Surgeon: Georgina Ozell LABOR, MD;  Location: [facility];  Service: Orthopedics;  Laterality: Left;   OPEN REDUCTION INTERNAL FIXATION (ORIF) TIBIA/FIBULA  FRACTURE Left 05/31/2024   Procedure: OPEN REDUCTION INTERNAL FIXATION (ORIF) OF LEFT DISTAL TIBIA/FIBULA FRACTURE;  Surgeon: Kendal Franky SQUIBB, MD;  Location: [facility];  Service: Orthopedics;  Laterality: Left;   OPEN REDUCTION INTERNAL FIXATION ACETABULUM POSTERIOR LATERAL Left 05/29/2024   Procedure: OPEN REDUCTION INTERNAL FIXATION ACETABULUM POSTERIOR LATERAL;  Surgeon: Kendal Franky SQUIBB, MD;  Location: [facility];  Service: Orthopedics;  Laterality: Left;   TOOTH EXTRACTION      Assessment & Plan Clinical Impression: HPI: Caitlyn [name] 43y/o right handed female with history significant for anemia, diet-controlled diabetes mellitus, hypertension, sleep apnea not on CPAP, quit smoking 15 years ago. Per chart review patient lives with spouse and 3 children ages 11, 76 and 22 as well as a 42 year old. Third floor apartment. Presented 05/27/2024 after reportedly jumping from a third story building. EMS reported the jump was intentional (suspect suicide attempt). Admission chemistries unremarkable except alcohol 186, urine drug screen negative, hemoglobin A1c 7.7, CO2 21, glucose 296, creatinine 1.14. Cranial CT scan negative. CT cervical spine negative. CT of the chest abdomen and pelvis showed left 3rd and 5th - seventh rib fractures. No pneumothorax. Posterior left hip dislocation with associated displaced acetabular fracture. Findings of left open tib-fib fracture as well as a CT left ankle showing markedly comminuted, open distal tibial fracture with displacement and intra-articular extension. Fracture fragments along the anterior lateral talus. Underwent ORIF left acetabular, external fixator left ankle 12/16, closed reduction and washout with VAC placement 12/17. Status post ORIF left tibia fibula and left malleolus fracture 12/19. Nonweightbearing left lower extremity.  Patient also underwent ORIF of posterior left hip dislocation with associated acetabular fracture 05/29/2024 per Dr. Kendal. Left rib fractures  3/5-7. Hospital course acute blood loss anemia transfuse 1 unit packed red blood cells 12/18 with latest hemoglobin 8.1. Patient was cleared to begin Lovenox  for DVT prophylaxis 05/30/2024. Blood pressure did have some increased elevations placed on low-dose amlodipine  06/03/2024. Psychiatry/behavioral health was consulted in regards to suspect suicide attempt and followed as directed and currently maintained on Cymbalta . Acute therapy evaluations completed due to patient decreased functional mobility was admitted for a comprehensive rehab program.  Patient transferred to CIR on 06/08/2024 .   Procedure dates per 12/27 DC summary from [facility]:  Procedures Dr. Ozell Ada - 05/28/24 Left hip dislocation closed reduction under anesthesia Left open pilon fracture irrigation and debridement to the level of bone at the site of the open fracture Application of uniplanar external fixator for left pilon fracture Placement of traction pin in the left distal femur   Dr. Franky Haddix - 05/29/24 Open reduction internal fixation of left transverse/posterior wall acetabular fracture Irrigation and debridement of left open distal tibia fracture Closed reduction of left distal tibia fracture Incision wound vac placement to left leg    Dr. Franky Haddix - 06/10/24 Open reduction internal fixation of left distal tibia and fibula fracture Open reduction internal fixation of left medial malleolus fracture Removal of external fixation of left ankle    Patient currently requires supervision with mobility secondary to muscle weakness, muscle joint tightness, and cast LLE and decreased standing balance and decreased endurance and functional strength.  Prior to hospitalization, patient was modified independent  with mobility and lived with Spouse, Family in a Apartment home.  Home access is 3rd level apartment; 8-9 steps each level per pt report with 2 railsStairs to enter.  Patient will benefit from skilled PT  intervention to maximize safe functional mobility, minimize fall risk, decrease caregiver burden, and improve functional strengthening to assist with care for her children for planned discharge home with intermittent assist.  Anticipate patient will benefit from follow up [facility] at discharge.  PT - End of Session Activity Tolerance: Tolerates < 10 min activity, no significant change in vital signs Endurance Deficit: Yes Endurance Deficit Description: fatigues with w/c mobility of 156 ft PT Assessment Rehab Potential (ACUTE/IP ONLY): Good PT Barriers to Discharge: None PT Patient demonstrates impairments in the following area(s): Balance;Endurance;Pain PT Transfers Functional Problem(s): Bed Mobility;Bed to Chair;Car PT Locomotion Functional Problem(s): Ambulation;Wheelchair Mobility;Stairs PT Plan PT Intensity: Minimum of 1-2 x/day ,45 to 90 minutes PT Frequency: Total of 15 hours over 7 days of combined therapies PT Duration Estimated Length of Stay: 10-12 days PT Treatment/Interventions: Ambulation/gait training;Discharge planning;Functional mobility training;Psychosocial support;Therapeutic Activities;Balance/vestibular training;Neuromuscular re-education;Therapeutic Exercise;DME/adaptive equipment instruction;UE/LE Strength taining/ROM;Patient/family education;Stair training PT Transfers Anticipated Outcome(s): MI PT Locomotion Anticipated Outcome(s): MI w/c level PT Recommendation Recommendations for Other Services: Neuropsych consult Follow Up Recommendations: Home health PT;Outpatient PT;24 hour supervision/assistance Patient destination: Home Equipment Recommended: Wheelchair (measurements);Rolling walker with 5 wheels   PT Evaluation Precautions/Restrictions Precautions Precautions: Fall;Other (comment) (NWB LLE; OK for hip and knee ROM per chart review; prior pain history R LE (significant)) Recall of Precautions/Restrictions: Intact Precaution/Restrictions Comments: rib fxs,  LLE NWB; L hip/pelvic fx Required Braces or Orthoses: Splint/Cast Splint/Cast: L lower leg Restrictions Weight Bearing Restrictions Per Provider Order: Yes LLE Weight Bearing Per Provider Order: Non weight bearing General   Vital SignsTherapy Vitals Temp: 98.3 F (36.8 C) Temp Source: Oral Pulse Rate: 93 Resp:  18 BP: 110/79 Patient Position (if appropriate): Sitting Oxygen Therapy SpO2: 97 % O2 Device: Room Air Pain Pain Assessment Pain Scale: 0-10 Pain Score: 7  Pain Location: Leg Pain Intervention(s): Medication (See eMAR);Repositioned;Hot/Cold interventions Pain Interference Pain Interference Pain Effect on Sleep: 3. Frequently Pain Interference with Therapy Activities: 1. Rarely or not at all Pain Interference with Day-to-Day Activities: 1. Rarely or not at all Home Living/Prior Functioning Home Living Available Help at Discharge: Family;Available PRN/intermittently Type of Home: Apartment Home Access: Stairs to enter [employer] of Steps: 3rd level apartment; 8-9 steps each level per pt report with 2 rails Entrance Stairs-Rails: Can reach both Home Layout: One level Bathroom Shower/Tub: Engineer, [employer] Systems: Standard Additional Comments: lives on 3rd floor apartment. lives with husband and 3 kids (10, 59 & 12 y.o.; also has 68 y.o.)  Lives With: Spouse;Family Prior Function Level of Independence: Independent with basic ADLs;Independent with gait;Independent with transfers  Able to Take Stairs?: Yes Driving: Yes (did not drive alot due to R lower extremity pain) Vision/Perception     Cognition Overall Cognitive Status: Within Functional Limits for tasks assessed Arousal/Alertness: Awake/alert Orientation Level: Oriented X4 Attention: Sustained Sustained Attention: Appears intact Memory: Appears intact Awareness: Appears intact Problem Solving: Appears intact Safety/Judgment: Appears intact Sensation Sensation Light Touch: Appears  Intact Coordination Gross Motor Movements are Fluid and Coordinated: No Coordination and Movement Description: limited by NWB for LLE Motor  Motor Motor: Within Functional Limits Motor - Skilled Clinical Observations: limited by pain and LLE NWB   Trunk/Postural Assessment  Cervical Assessment Cervical Assessment: Within Functional Limits Thoracic Assessment Thoracic Assessment: Within Functional Limits Lumbar Assessment Lumbar Assessment: Within Functional Limits Postural Control Postural Control: Within Functional Limits (postural control is WFL but some movements, primarily thoracic extension is more painful due to rib fxs)  Balance Balance Balance Assessed: Yes Static Sitting Balance Static Sitting - Balance Support: Feet supported Static Sitting - Level of Assistance: 5: Stand by assistance Dynamic Sitting Balance Dynamic Sitting - Balance Support: Feet unsupported Dynamic Sitting - Level of Assistance: 5: Stand by assistance Dynamic Sitting - Balance Activities: Lateral lean/weight shifting;Forward lean/weight shifting;Reaching for objects;Reaching across midline Sitting balance - Comments: tolerates sitting EOB with and without UE support Extremity Assessment      RLE Assessment RLE Assessment: Within Functional Limits General Strength Comments: R hip flexion 3+/5; all others 4/5 LLE Assessment LLE Assessment: Not tested Active Range of Motion (AROM) Comments: pt can flex L knee to approx 90 degrees supine  Care Tool Care Tool Bed Mobility Roll left and right activity   Roll left and right assist level: Supervision/Verbal cueing    Sit to lying activity   Sit to lying assist level: Supervision/Verbal cueing    Lying to sitting on side of bed activity   Lying to sitting on side of bed assist level: the ability to move from lying on the back to sitting on the side of the bed with no back support.: Supervision/Verbal cueing     Care Tool Transfers Sit to stand  transfer   Sit to stand assist level: Contact Guard/Touching assist    Chair/bed transfer   Chair/bed transfer assist level: Contact Guard/Touching assist    Car transfer Car transfer activity did not occur: Safety/medical concerns        Care Tool Locomotion Ambulation Ambulation activity did not occur: Safety/medical concerns        Walk 10 feet activity Walk 10 feet activity did not occur: Safety/medical concerns  Walk 50 feet with 2 turns activity Walk 50 feet with 2 turns activity did not occur: Safety/medical concerns      Walk 150 feet activity Walk 150 feet activity did not occur: Safety/medical concerns      Walk 10 feet on uneven surfaces activity Walk 10 feet on uneven surfaces activity did not occur: Safety/medical concerns      Stairs Stair activity did not occur: Safety/medical concerns        Walk up/down 1 step activity Walk up/down 1 step or curb (drop down) activity did not occur: Safety/medical concerns      Walk up/down 4 steps activity Walk up/down 4 steps activity did not occur: Safety/medical concerns      Walk up/down 12 steps activity Walk up/down 12 steps activity did not occur: Safety/medical concerns      Pick up small objects from floor Pick up small object from the floor (from standing position) activity did not occur: Safety/medical concerns      Wheelchair Is the patient using a wheelchair?: Yes Type of Wheelchair: Manual   Wheelchair assist level: Supervision/Verbal cueing Max wheelchair distance: 156t  Wheel 50 feet with 2 turns activity   Assist Level: Supervision/Verbal cueing  Wheel 150 feet activity   Assist Level: Supervision/Verbal cueing    Refer to Care Plan for Long Term Goals  SHORT TERM GOAL WEEK 1 PT Short Term Goal 1 (Week 1): Pt will be MI with supine to sit to supine transfer PT Short Term Goal 2 (Week 1): Pt will be MI with squat pivot transfer to/from wheelchair to bed/mat table PT Short Term Goal 3  (Week 1): Pt will be able to perform wheelchair mobility > 300 ft with MI without rest PT Short Term Goal 4 (Week 1): Pt will be able to stand x 2 min LLE NWB to assist with functional RLE strengthening  Recommendations for other services: Neuropsych  Skilled Therapeutic Intervention Mobility Bed Mobility Bed Mobility: Sitting - Scoot to Edge of Bed;Supine to Sit;Sit to Supine Supine to Sit: Supervision/Verbal cueing Sitting - Scoot to Edge of Bed: Supervision/Verbal cueing Sit to Supine: Supervision/Verbal cueing Transfers Transfers: Squat Pivot Transfers Squat Pivot Transfers: Contact Guard/Touching assist (pt reports not wanting to use the RW to assist with the transfer due to difficulty feeling her hands) Transfer (Assistive device): None Locomotion  Gait Ambulation: No Gait Gait: No Stairs / Additional Locomotion Stairs: No Wheelchair Mobility Wheelchair Mobility: Yes Wheelchair Assistance: Doctor, General Practice: Both upper extremities Wheelchair Parts Management: Supervision/cueing Distance: 156  06/09/24 sessions: Session 1: Pt received sitting in wheelchair and agreeable to PT evaluation and treatment. Pt denies pain.  Treatment: R QS x 15, R SLR x 15, R ankle pumps x 15, R bridge x 5. PT focused on quality and cadence of TE. Discussion of PT POC during therex.  Session 2: Pt received sitting in wheelchair; denies pain, pt agreeable to PT tx. Theract: W/C mobility performed x 120 ft, x 156 ft, x 156 ft with PT verbal cues for technique. W/C turns, backing up, and overall navigation education performed with pt requiring S to CGA at times to perform until she was able to perform correctly. W/C switched out for another 18x20 wheelchair with L elevating leg rest needing to be found and adjusted due to decreased quality of initial w/c found for pt. Pt with greater ease of propelling new w/c. Pt left in wheelchair, seatbelt alarm on, all needs within  reach. Nurse issued pain medication  as scheduled with pt reports of 7-8/10 pain to nursing.    Discharge Criteria: Patient will be discharged from PT if patient refuses treatment 3 consecutive times without medical reason, if treatment goals not met, if there is a change in medical status, if patient makes no progress towards goals or if patient is discharged from hospital.  The above assessment, treatment plan, treatment alternatives and goals were discussed and mutually agreed upon: by patient  Alger Ada 06/09/2024, 4:21 PM

## 2024-06-09 NOTE — Progress Notes (Signed)
 Inpatient Rehabilitation Admission Medication Review by a Pharmacist  A complete drug regimen review was completed for this patient to identify any potential clinically significant medication issues.  High Risk Drug Classes Is patient taking? Indication by Medication  Antipsychotic No   Anticoagulant Yes Lovenox  - dvt ppx  Antibiotic No   Opioid Yes Oxycodone  - pain  Antiplatelet No   Hypoglycemics/insulin  Yes Novolog  - hyperglycemia  Vasoactive Medication Yes Amlodipine , - htn  Chemotherapy No   Other Yes PEG , docusate , and - constipation Acetaminophen - pain  Zofran - n/v  Robaxin - muscle spasms  gabapentin - neuropathy   melatonin and -insomnia Duloxetine  - mood Lidocaine  - pain Vitamin d  - supplement     Type of Medication Issue Identified Description of Issue Recommendation(s)  Drug Interaction(s) (clinically significant)     Duplicate Therapy     Allergy     No Medication Administration End Date     Incorrect Dose     Additional Drug Therapy Needed     Significant med changes from prior encounter (inform family/care partners about these prior to discharge).  Communicate relevant medication changes to patient/family members at discharge from CIR.   Restart or discontinue PTA meds not resumed in CIR at discharge if clinically indicated.   Other       Clinically significant medication issues were identified that warrant physician communication and completion of prescribed/recommended actions by midnight of the next day:  No  Name of provider notified for urgent issues identified:   Provider Method of Notification:     Pharmacist comments:   Time spent performing this drug regimen review (minutes):  10  Larraine Brazier, PharmD Clinical Pharmacist 06/09/2024  2:13 PM **Pharmacist phone directory can now be found on [email or web address] ([email or web address]).  Listed under [facility].

## 2024-06-09 NOTE — Progress Notes (Signed)
 "  Signed      Expand All Collapse All PMR Admission Coordinator Pre-Admission Assessment   Patient: Caitlyn [name] is an 43 y.o., female MRN: [id number] DOB: 01-23-1981 Height: 5' 4 (162.6 cm) Weight: 103.4 kg   Insurance Information HMO: Yes    PPO:       PCP:       IPA:       80/20:       OTHER:  [place] [id number] PRIMARY: [employer] QHP      Policy#: [id number]      Subscriber: self CM Name: TBD      Phone#: TBD     Fax#: [phone] Pre-Cert#: [id number] Received approval on 06/07/24. Pt approved from 06/06/24-06/12/24. Pt approved for 7 days.     Employer: Not currently working Benefits:  Phone #: [phone]     Name: Verified on [email or web address] on line Eff. Date: 11/12/23 - 06/12/24     Deduct: $4304 (met $3615.96)      Out of Pocket Max: [phone] (met $3615.96)      Life Max: n/a CIR: 50%      SNF: 50% limited to 90 days/year Outpatient: 50%     Co-Pay: 50% Home Health: 50%      Co-Pay: 50% DME: 50%     Co-Pay: 50% Providers: in network  SECONDARY:       Policy#:      Phone#:    Artist:       Phone#:    The Best Boy for patients in Inpatient Rehabilitation Facilities with attached Privacy Act Statement-Health Care Records was provided and verbally reviewed with: Patient   Emergency Contact Information Contact Information       Name Relation Home Work Mobile    [place] Significant other [phone]   [phone]         Other Contacts   None on File        Current Medical History  Patient Admitting Diagnosis: Multiple trauma   History of Present Illness: A 43 yo female presented to [facility] on 05/27/24 to the ED department s/p jump from third story window, reportedly intentionally. Patient reports being amnestic to the event and needs prompting to remember anything from yesterday. Husband at bedside reports this occurred after they had an argument.  Workup for L rib 3,5-7 fxs, L open tib-fib fx,  and posterior hip dislocation with acetabulum fx. S/p L tibial ex fix with wound vac placement, L acetabular ORIF on 05/29/24. On 05/31/24 removed external fixator with ORIF distal tib-fib and medical malleolus fractures.  Psychiatry has seen patient and followed and patient has been cleared from suicidal attempt.  PT/OT evaluations completed with recommendations for acute inpatient rehab admission.   Patient's medical record from [facility] has been reviewed by the rehabilitation admission coordinator and physician.   Past Medical History      Past Medical History:  Diagnosis Date   Anemia     Diabetes mellitus without complication (HCC)      GM - Diet controlled - no meds   Hypertension      PIH with pregnancy   Sleep apnea      does not use CPAP - lost weight   SVD (spontaneous vaginal delivery)      x 2          Has the patient had major surgery during 100 days prior to admission? Yes   Family History   family history is  not on file.   Current Medications [Current Medications]  [Current Medications]    Current Facility-Administered Medications:    acetaminophen  (TYLENOL ) tablet 1,000 mg, 1,000 mg, Oral, Q6H, McClung, Sarah A, PA-C, 1,000 mg at 06/08/24 0620   amLODipine  (NORVASC ) tablet 5 mg, 5 mg, Oral, Daily, Kinsinger, Herlene Righter, MD, 5 mg at 06/08/24 0930   calcium  gluconate 2 g/ 100 mL sodium chloride  IVPB, 2 g, Intravenous, Once, McClung, Sarah A, PA-C   Chlorhexidine  Gluconate Cloth 2 % PADS 6 each, 6 each, Topical, Daily, Danton Lauraine LABOR, PA-C, 6 each at 06/06/24 0913   docusate sodium  (COLACE) capsule 100 mg, 100 mg, Oral, BID, Danton Lauraine LABOR, PA-C, 100 mg at 06/08/24 9066   DULoxetine  (CYMBALTA ) DR capsule 20 mg, 20 mg, Oral, Daily, Danton Lauraine LABOR, PA-C, 20 mg at 06/08/24 9066   enoxaparin  (LOVENOX ) injection 30 mg, 30 mg, Subcutaneous, Q12H, Danton Lauraine LABOR, PA-C, 30 mg at 06/08/24 9065   gabapentin  (NEURONTIN ) capsule 100 mg, 100 mg, Oral, TID,  Danton Lauraine LABOR, PA-C, 100 mg at 06/08/24 0930   hydrALAZINE  (APRESOLINE ) injection 10 mg, 10 mg, Intravenous, Q2H PRN, Danton Lauraine LABOR, PA-C, 10 mg at 06/03/24 0915   HYDROmorphone  (DILAUDID ) injection 0.5 mg, 0.5 mg, Intravenous, Q2H PRN, Danton Lauraine LABOR, PA-C, 0.5 mg at 06/04/24 2215   insulin  aspart (novoLOG ) injection 0-15 Units, 0-15 Units, Subcutaneous, TID WC, McClung, Sarah A, PA-C, 5 Units at 06/08/24 9063   lidocaine  (LIDODERM ) 5 % 1 patch, 1 patch, Transdermal, Q24H, Danton Lauraine LABOR, PA-C, 1 patch at 06/08/24 0930   melatonin tablet 5 mg, 5 mg, Oral, QHS, McClung, Sarah A, PA-C, 5 mg at 06/07/24 2146   methocarbamol  (ROBAXIN ) tablet 500 mg, 500 mg, Oral, Q6H PRN, Danton Lauraine LABOR, PA-C, 500 mg at 06/08/24 9378   metoCLOPramide  (REGLAN ) tablet 5-10 mg, 5-10 mg, Oral, Q8H PRN **OR** metoCLOPramide  (REGLAN ) injection 5-10 mg, 5-10 mg, Intravenous, Q8H PRN, McClung, Sarah A, PA-C   metoprolol  tartrate (LOPRESSOR ) injection 5 mg, 5 mg, Intravenous, Q6H PRN, McClung, Sarah A, PA-C   ondansetron  (ZOFRAN -ODT) disintegrating tablet 4 mg, 4 mg, Oral, Q6H PRN **OR** ondansetron  (ZOFRAN ) injection 4 mg, 4 mg, Intravenous, Q6H PRN, McClung, Sarah A, PA-C   oxyCODONE  (Oxy IR/ROXICODONE ) immediate release tablet 5-10 mg, 5-10 mg, Oral, Q4H PRN, Danton Lauraine LABOR, PA-C, 10 mg at 06/08/24 9379   polyethylene glycol (MIRALAX  / GLYCOLAX ) packet 17 g, 17 g, Oral, BID, Maczis, Michael M, PA-C, 17 g at 06/07/24 9062   sodium zirconium cyclosilicate  (LOKELMA ) packet 5 g, 5 g, Oral, Once, McClung, Sarah A, PA-C   Vitamin D  (Ergocalciferol ) (DRISDOL ) 1.25 MG (50000 UNIT) capsule 50,000 Units, 50,000 Units, Oral, Q7 days, McClung, Sarah A, PA-C, 50,000 Units at 06/06/24 9471    Patients Current Diet:  Diet Order                  Diet Carb Modified Room service appropriate? Yes  Diet effective now                         Precautions / Restrictions Precautions Precautions: Fall, Other  (comment) Precaution/Restrictions Comments: L rib fxs Restrictions Weight Bearing Restrictions Per Provider Order: Yes LLE Weight Bearing Per Provider Order: Non weight bearing    Has the patient had 2 or more falls or a fall with injury in the past year? Yes   Prior Activity Level Household: Has been homebound due to issues with right hip  Prior Functional Level Self Care: Did the patient need help bathing, dressing, using the toilet or eating? Independent   Indoor Mobility: Did the patient need assistance with walking from room to room (with or without device)? Independent   Stairs: Did the patient need assistance with internal or external stairs (with or without device)? Independent   Functional Cognition: Did the patient need help planning regular tasks such as shopping or remembering to take medications? Independent   Patient Information Are you of Hispanic, Latino/a,or Spanish origin?: A. No, not of Hispanic, Latino/a, or Spanish origin What is your race?: B. Black or African American Do you need or want an interpreter to communicate with a doctor or health care staff?: 0. No   Patient's Response To:  Health Literacy and Transportation Is the patient able to respond to health literacy and transportation needs?: Yes Health Literacy - How often do you need to have someone help you when you read instructions, pamphlets, or other written material from your doctor or pharmacy?: Never In the past 12 months, has lack of transportation kept you from medical appointments or from getting medications?: No In the past 12 months, has lack of transportation kept you from meetings, work, or from getting things needed for daily living?: No   Home Assistive Devices / Equipment Home Equipment: [place] - single point   Prior Device Use: Indicate devices/aids used by the patient prior to current illness, exacerbation or injury? Cane   Current Functional Level Cognition   Arousal/Alertness:  Awake/alert Overall Cognitive Status: Within Functional Limits for tasks assessed Orientation Level: Oriented X4 Attention: Alternating Alternating Attention: Appears intact Memory: Appears intact Awareness: Appears intact Problem Solving: Appears intact Safety/Judgment: Appears intact    Extremity Assessment (includes Sensation/Coordination)   Upper Extremity Assessment: Generalized weakness, RUE deficits/detail RUE Deficits / Details: Increased shoulder pain with ROM.  Shoulder ROM deficits noted in shoulder flexion (~70 degrees), internal rotation (>45 degrees), abduction (~45 degrees). Scapular ROM deficits and pain observed with elevation and upward rotation. RUE: Shoulder pain with ROM RUE Sensation: WNL RUE Coordination: decreased gross motor  Lower Extremity Assessment: Defer to PT evaluation RLE Deficits / Details: h/o R hip injury (pt reports recently dx with hip tears) and R hip/knee buckling, painful; gross knee strength >3/5, gross hip strength >/ 3-/5 LLE Deficits / Details: s/p L acetabular ORIF and L tibial ex-fix with expected post-op pain and weakness; gross hip strength < 3/5, able to fully extend L knee sitting EOB, able to wiggle toes LLE: Unable to fully assess due to immobilization     ADLs   Overall ADL's : Needs assistance/impaired Eating/Feeding: Independent, Sitting Grooming: Set up, Sitting Upper Body Bathing: Minimal assistance, Bed level Lower Body Bathing: Total assistance, Bed level Upper Body Dressing : Minimal assistance, Sitting Lower Body Dressing: Total assistance, Bed level Toilet Transfer Details (indicate cue type and reason): Will need +2 A, lateral scoot to drop arm BSC Toileting- Clothing Manipulation and Hygiene: Total assistance Toileting - Clothing Manipulation Details (indicate cue type and reason): catheter General ADL Comments: Pt with improved sitting balance this date, focus of future sessions on progessing ADL engagement OOB.      Mobility   Overal bed mobility: Needs Assistance Bed Mobility: Supine to Sit Rolling: Max assist Supine to sit: Contact guard Sit to supine: Min assist General bed mobility comments: CGA for safety, pt using bed rail and HOB elevated to assist.     Transfers   Overall transfer level: Needs assistance Equipment  used: None, Sliding board Transfers: Bed to chair/wheelchair/BSC Bed to/from chair/wheelchair/BSC transfer type:: Squat pivot, Lateral/scoot transfer Squat pivot transfers: Min assist  Lateral/Scoot Transfers: Min assist, With slide board General transfer comment: Squat pivot from elevated bed>wheelchair on her R, then from [facility] to drop arm recliner pt using slide board for safety due to increased space between surfaces and minA each transfer to ensure LLE stays off floor and for safety/stability. Min cues for UE placement and technique as well as checking brakes prior to performing transfers.     Ambulation / Gait / Stairs / Wheelchair Mobility   Ambulation/Gait General Gait Details: pt reports not feeling comfortable to attempt stand pivot transfer with RW yet. Naval Architect mobility: Yes Wheelchair propulsion: Both upper extremities Wheelchair parts: Needs assistance Distance: 150 (including brief seated rest breaks) Wheelchair Assistance Details (indicate cue type and reason): HR to ~124 bpm with exertion, SpO2 98% on RA. Pt needing initial cues for use of brakes and safe technique to propel chair and turn. Pt needs frequent reminders to maintain chair in middle of hallway as she tends to veer to R side of hallway and frequent minA to maintain steering in correct direction and avoid obstacles, esp in narrower areas. Increased time to perform.     Posture / Balance Dynamic Sitting Balance Sitting balance - Comments: tolerates sitting EOB with and without UE support Balance Overall balance assessment: Needs assistance Sitting-balance support: Feet  supported Sitting balance-Leahy Scale: Fair Sitting balance - Comments: tolerates sitting EOB with and without UE support Standing balance comment: pt defers to attempt standing to RW     Special considerations/life events  Skin Post op left surgical hip incision with dressing, Diabetic management Hgb A1C 7.7 and is a diabetic, Behavioral consideration Suicide attempt versus fall with psychiatry following    Previous Home Environment (from acute therapy documentation) Living Arrangements: Spouse/significant other, Children  Lives With: Spouse, Family Available Help at Discharge: Family, Available PRN/intermittently Type of Home: Apartment Home Layout: Multi-level Home Access: Level entry Bathroom Shower/Tub: Engineer, Manufacturing Systems: Standard Home Care Services: No Additional Comments: lives on 3rd floor apartment. lives with husband and 3 kids (10, 81 & 51 y.o.; also has 50 y.o.)   Discharge Living Setting Plans for Discharge Living Setting: House, Lives with (comment) (Plans to go home to MIL home) Type of Home at Discharge: House Discharge Home Layout: One level Discharge Home Access: Ramped entrance Discharge Bathroom Shower/Tub: Tub/shower unit, Curtain Discharge Bathroom Toilet: Standard Discharge Bathroom Accessibility: Yes How Accessible: Accessible via walker Does the patient have any problems obtaining your medications?: No   Social/Family/Support Systems Patient Roles: Spouse, Parent (Has husband, 12 yo so, 44 yo dtr and 33 yo son) Contact Information: Darold Minerva - husband Anticipated Caregiver: Darold - husband Anticipated Caregiver's Contact Information: [phone] Ability/Limitations of Caregiver: Husband works but PEPSICO is available and can assist and provide supervision Caregiver Availability: 24/7 Discharge Plan Discussed with Primary Caregiver: Yes Is Caregiver In Agreement with Plan?: Yes Does Caregiver/Family have Issues with  Lodging/Transportation while Pt is in Rehab?: No   Goals Patient/Family Goal for Rehab: PT/OT supervision goals Expected length of stay: 10-12 days Pt/Family Agrees to Admission and willing to participate: Yes Program Orientation Provided & Reviewed with Pt/Caregiver Including Roles  & Responsibilities: Yes   Decrease burden of Care through IP rehab admission: N/A   Possible need for SNF placement upon discharge: Not anticipated   Patient Condition: I have reviewed [facility], spoken with CSW, and patient. I met with patient at the bedside for inpatient rehabilitation assessment.  Patient will benefit from ongoing PT and OT, can actively participate in 3 hours of therapy a day 5 days of the week, and can make measurable gains during the admission.  Patient will also benefit from the coordinated team approach during an Inpatient Acute Rehabilitation admission.  The patient will receive intensive therapy as well as Rehabilitation physician, nursing, social worker, and care management interventions.  Due to bladder management, bowel management, safety, skin/wound care, disease management, medication administration, pain management, and patient education the patient requires 24 hour a day rehabilitation nursing.  The patient is currently Min A with mobility and basic ADLs.  Discharge setting and therapy post discharge at home with home health is anticipated.  Patient has agreed to participate in the Acute Inpatient Rehabilitation Program and will admit today.   Preadmission Screen Completed By:  Lovett CHRISTELLA Ropes, 06/08/2024 9:45 AM ______________________________________________________________________   Discussed status with Dr. Lovorn on 06/08/2024  at 9:45 AM and received approval for admission today.   Admission Coordinator:  Lovett CHRISTELLA Ropes, RN, time  9:45 AM/Date 06/08/2024     Assessment/Plan: Diagnosis: Multi trauma due to jumping out of window Does the need  for close, 24 hr/day Medical supervision in concert with the patient's rehab needs make it unreasonable for this patient to be served in a less intensive setting? Yes Co-Morbidities requiring supervision/potential complications: L open tib fib fx s/p ORIF, L acetabulum and L malleolar fx s/p ORIF- NWB LLE- R shulder pain- L rib fx's- DM, HTN, OSA, morbid obesity; constipation Due to bladder management, bowel management, safety, skin/wound care, disease management, medication administration, pain management, and patient education, does the patient require 24 hr/day rehab nursing? Yes Does the patient require coordinated care of a physician, rehab nurse, PT, OT to address physical and functional deficits in the context of the above medical diagnosis(es)? Yes Addressing deficits in the following areas: balance, endurance, locomotion, strength, transferring, bowel/bladder control, bathing, dressing, feeding, grooming, and toileting Can the patient actively participate in an intensive therapy program of at least 3 hrs of therapy 5 days a week? Yes The potential for patient to make measurable gains while on inpatient rehab is good Anticipated functional outcomes upon discharge from inpatient rehab: supervision PT, supervision OT, n/a SLP Estimated rehab length of stay to reach the above functional goals is: 10-12 days Anticipated discharge destination: Home 10. Overall Rehab/Functional Prognosis: good     MD Signature:               Revision History  Date/Time User Provider Type Action  06/08/2024  9:58 AM Cornelio Bouchard, MD Physician Sign  06/08/2024  9:45 AM Yvone Delayne Tinnie SHAUNNA, CCC-SLP Rehab Admission Coordinator Share  06/07/2024  3:56 PM Yvone Delayne Tinnie SHAUNNA, CCC-SLP Rehab Admission Coordinator Share  06/07/2024  3:54 PM Yvone Delayne Tinnie SHAUNNA, CCC-SLP Rehab Admission Coordinator Share  06/07/2024 10:23 AM Yvone Delayne Tinnie SHAUNNA, CCC-SLP Rehab Admission Coordinator Share   06/05/2024  3:54 PM Ropes Lovett CHRISTELLA, RN Rehab Admission Coordinator Share  06/05/2024  3:47 PM Logue, Lovett CHRISTELLA, RN Rehab Admission Coordinator Share   "

## 2024-06-09 NOTE — Plan of Care (Signed)
" °  Problem: RH Balance Goal: LTG: Patient will maintain dynamic sitting balance (OT) Description: LTG:  Patient will maintain dynamic sitting balance with assistance during activities of daily living (OT) Flowsheets (Taken 06/09/2024 1248) LTG: Pt will maintain dynamic sitting balance during ADLs with: Independent with assistive device Goal: LTG Patient will maintain dynamic standing with ADLs (OT) Description: LTG:  Patient will maintain dynamic standing balance with assist during activities of daily living (OT)  Flowsheets (Taken 06/09/2024 1248) LTG: Pt will maintain dynamic standing balance during ADLs with: Supervision/Verbal cueing   Problem: Sit to Stand Goal: LTG:  Patient will perform sit to stand in prep for activites of daily living with assistance level (OT) Description: LTG:  Patient will perform sit to stand in prep for activites of daily living with assistance level (OT) Flowsheets (Taken 06/09/2024 1248) LTG: PT will perform sit to stand in prep for activites of daily living with assistance level: Supervision/Verbal cueing   Problem: RH Eating Goal: LTG Patient will perform eating w/assist, cues/equip (OT) Description: LTG: Patient will perform eating with assist, with/without cues using equipment (OT) Flowsheets (Taken 06/09/2024 1248) LTG: Pt will perform eating with assistance level of: Independent   Problem: RH Grooming Goal: LTG Patient will perform grooming w/assist,cues/equip (OT) Description: LTG: Patient will perform grooming with assist, with/without cues using equipment (OT) Flowsheets (Taken 06/09/2024 1248) LTG: Pt will perform grooming with assistance level of: Independent with assistive device    Problem: RH Bathing Goal: LTG Patient will bathe all body parts with assist levels (OT) Description: LTG: Patient will bathe all body parts with assist levels (OT) Flowsheets (Taken 06/09/2024 1248) LTG: Pt will perform bathing with assistance level/cueing:  Supervision/Verbal cueing   Problem: RH Dressing Goal: LTG Patient will perform upper body dressing (OT) Description: LTG Patient will perform upper body dressing with assist, with/without cues (OT). Flowsheets (Taken 06/09/2024 1248) LTG: Pt will perform upper body dressing with assistance level of: Set up assist Goal: LTG Patient will perform lower body dressing w/assist (OT) Description: LTG: Patient will perform lower body dressing with assist, with/without cues in positioning using equipment (OT) Flowsheets (Taken 06/09/2024 1248) LTG: Pt will perform lower body dressing with assistance level of: Supervision/Verbal cueing   Problem: RH Toileting Goal: LTG Patient will perform toileting task (3/3 steps) with assistance level (OT) Description: LTG: Patient will perform toileting task (3/3 steps) with assistance level (OT)  Flowsheets (Taken 06/09/2024 1248) LTG: Pt will perform toileting task (3/3 steps) with assistance level: Supervision/Verbal cueing   Problem: RH Simple Meal Prep Goal: LTG Patient will perform simple meal prep w/assist (OT) Description: LTG: Patient will perform simple meal prep with assistance, with/without cues (OT). Flowsheets (Taken 06/09/2024 1248) LTG: Pt will perform simple meal prep with assistance level of: Supervision/Verbal cueing   Problem: RH Toilet Transfers Goal: LTG Patient will perform toilet transfers w/assist (OT) Description: LTG: Patient will perform toilet transfers with assist, with/without cues using equipment (OT) Flowsheets (Taken 06/09/2024 1248) LTG: Pt will perform toilet transfers with assistance level of: Supervision/Verbal cueing   Problem: RH Tub/Shower Transfers Goal: LTG Patient will perform tub/shower transfers w/assist (OT) Description: LTG: Patient will perform tub/shower transfers with assist, with/without cues using equipment (OT) Flowsheets (Taken 06/09/2024 1248) LTG: Pt will perform tub/shower stall transfers with  assistance level of: Supervision/Verbal cueing   "

## 2024-06-09 NOTE — Discharge Instructions (Addendum)
 Inpatient Rehab Discharge Instructions  Caitlyn [name] Discharge date and time: No discharge date for patient encounter.   Activities/Precautions/ Functional Status: Activity: Nonweightbearing left lower extremity Diet: Diabetic diet Wound Care: Routine skin checks Functional status:  ___ No restrictions     ___ Walk up steps independently ___ 24/7 supervision/assistance   ___ Walk up steps with assistance ___ Intermittent supervision/assistance  ___ Bathe/dress independently ___ Walk with walker     _x__ Bathe/dress with assistance ___ Walk Independently    ___ Shower independently ___ Walk with assistance    ___ Shower with assistance ___ No alcohol     ___ Return to work/school ________  Special Instructions:  No driving smoking or alcohol  COMMUNITY REFERRALS UPON DISCHARGE:    Outpatient: PT   once patient is weight bearing              Agency: [facility] Phone:  [phone]             Appointment Date/Time: *Please expect follow-up within 7-10 business days to schedule your appointment. If you have not received follow-up, be sure to contact the site directly.*  Medical Equipment/Items Ordered: 3-1 commode and 20x16 wheelchair with bilateral elevating leg rests                                                  Agency/Supplier: [employer]    My questions have been answered and I understand these instructions. I will adhere to these goals and the provided educational materials after my discharge from the hospital.  Patient/Caregiver Signature _______________________________ Date __________  Clinician Signature _______________________________________ Date __________  Please bring this form and your medication list with you to all your follow-up doctor's appointments.      Plate Method for Diabetes  Foods with carbohydrates make your blood glucose level go up. The plate method is a simple way to meal plan and control the  amount of carbohydrate you eat.         Use the following guidance to build a healthy plate to control carbohydrates. Divide a 9-inch plate into 3 sections, and consider your beverage the 4th section of your meal: Food Group Examples of Foods/Beverages for This Section of your Meal  Section 1: Non-starchy vegetables Fill  of your plate to include non-starchy vegetables Asparagus, broccoli, brussels sprouts, cabbage, carrots, cauliflower, celery, cucumber, green beans, mushrooms, peppers, salad greens, tomatoes, or zucchini.  Section 2: Protein foods Fill  of your plate to include a lean protein Lean meat, poultry, fish, seafood, cheese, eggs, lean deli meat, tofu, beans, lentils, nuts or nut butters.  Section 3: Carbohydrate foods Fill  of your plate to include carbohydrate foods Whole grains, whole wheat bread, brown rice, whole grain pasta, polenta, corn tortillas, fruit, or starchy vegetables (potatoes, green peas, corn, beans, acorn squash, and butternut squash). One cup of milk also counts as a food that contains carbohydrate.  Section 4: Beverage Choose water  or a low-calorie drink for your beverage. Unsweetened tea, coffee, or flavored/sparkling water  without added sugar.  Image reprinted with permission from The American Diabetes Association.  Copyright 2022 by the American Diabetes Association.  ____________________________________________________________________  Information on my medicine - ELIQUIS  (apixaban )  This medication education was reviewed with me or my healthcare representative as part of my discharge preparation.  Why was Eliquis  prescribed for you? Eliquis  was prescribed for you to reduce the risk of blood clots forming after orthopedic surgery.    What do You need to know about Eliquis ? Take your Eliquis  TWICE DAILY - one tablet in the morning and one tablet in the evening with or without food.  It would be best to take the dose about the same time each  day.  If you have difficulty swallowing the tablet whole please discuss with your pharmacist how to take the medication safely.  Take Eliquis  exactly as prescribed by your doctor and DO NOT stop taking Eliquis  without talking to the doctor who prescribed the medication.  Stopping without other medication to take the place of Eliquis  may increase your risk of developing a clot.  After discharge, you should have regular check-up appointments with your healthcare provider that is prescribing your Eliquis .  What do you do if you miss a dose? If a dose of ELIQUIS  is not taken at the scheduled time, take it as soon as possible on the same day and twice-daily administration should be resumed.  The dose should not be doubled to make up for a missed dose.  Do not take more than one tablet of ELIQUIS  at the same time.  Important Safety Information A possible side effect of Eliquis  is bleeding. You should call your healthcare provider right away if you experience any of the following: Bleeding from an injury or your nose that does not stop. Unusual colored urine (red or dark brown) or unusual colored stools (red or black). Unusual bruising for unknown reasons. A serious fall or if you hit your head (even if there is no bleeding).  Some medicines may interact with Eliquis  and might increase your risk of bleeding or clotting while on Eliquis . To help avoid this, consult your healthcare provider or pharmacist prior to using any new prescription or non-prescription medications, including herbals, vitamins, non-steroidal anti-inflammatory drugs (NSAIDs) and supplements.  This website has more information on Eliquis  (apixaban ): http://www.eliquis .com/eliquis dena

## 2024-06-09 NOTE — Plan of Care (Signed)
" °  Problem: RH Balance Goal: LTG Patient will maintain dynamic standing balance (PT) Description: LTG:  Patient will maintain dynamic standing balance with assistance during mobility activities (PT) Flowsheets (Taken 06/09/2024 1651) LTG: Pt will maintain dynamic standing balance during mobility activities with:: Supervision/Verbal cueing   Problem: RH Car Transfers Goal: LTG Patient will perform car transfers with assist (PT) Description: LTG: Patient will perform car transfers with assistance (PT). Flowsheets (Taken 06/09/2024 1651) LTG: Pt will perform car transfers with assist:: Supervision/Verbal cueing   Problem: RH Ambulation Goal: LTG Patient will ambulate in controlled environment (PT) Description: LTG: Patient will ambulate in a controlled environment, # of feet with assistance (PT). Flowsheets (Taken 06/09/2024 1651) LTG: Pt will ambulate in controlled environ  assist needed:: Contact Guard/Touching assist LTG: Ambulation distance in controlled environment: 20 ft with RW   Problem: RH Wheelchair Mobility Goal: LTG Patient will propel w/c in controlled environment (PT) Description: LTG: Patient will propel wheelchair in controlled environment, # of feet with assist (PT) Flowsheets (Taken 06/09/2024 1651) LTG: Pt will propel w/c in controlled environ  assist needed:: Set up assist LTG: Propel w/c distance in controlled environment: >/=500 ft   Problem: RH Stairs Goal: LTG Patient will ambulate up and down stairs w/assist (PT) Description: LTG: Patient will ambulate up and down # of stairs with assistance (PT) Flowsheets (Taken 06/09/2024 1651) LTG: Pt will ambulate up/down stairs assist needed:: Minimal Assistance - Patient > 75% LTG: Pt will  ambulate up and down number of stairs: 24 steps   "

## 2024-06-09 NOTE — Evaluation (Signed)
 Occupational Therapy Assessment and Plan  Patient Details  Name: Caitlyn [name] MRN: [id number] Date of Birth: April 06, 1981  OT Diagnosis: abnormal posture, acute pain, and muscle weakness (generalized) Rehab Potential:   ELOS: 10-12   Today's Date: 06/09/2024 OT Individual Time: 9269-9144 OT Individual Time Calculation (min): 85 min     Hospital Problem: Principal Problem:   Multiple traumatic injuries Active Problems:   Critical polytrauma   Past Medical History:  Past Medical History:  Diagnosis Date   Anemia    Diabetes mellitus without complication (HCC)    GM - Diet controlled - no meds   Hypertension    PIH with pregnancy   Sleep apnea    does not use CPAP - lost weight   SVD (spontaneous vaginal delivery)    x 2   Past Surgical History:  Past Surgical History:  Procedure Laterality Date   CESAREAN SECTION  03/13/2010   CESAREAN SECTION WITH BILATERAL TUBAL LIGATION N/A 02/25/2014   Procedure: CESAREAN SECTION WITH BILATERAL TUBAL LIGATION;  Surgeon: Lang JINNY Peel, DO;  Location: [facility];  Service: Obstetrics;  Laterality: N/A;   EXTERNAL FIXATION LEG Left 05/27/2024   Procedure: EXTERNAL FIXATION, LOWER EXTREMITY;  Surgeon: Georgina Ozell LABOR, MD;  Location: [facility];  Service: Orthopedics;  Laterality: Left;   EXTERNAL FIXATION, ANKLE Left 05/29/2024   Procedure: ADJUSTMENT EXTERNAL FIXATION, ANKLE;  Surgeon: Kendal Franky SQUIBB, MD;  Location: [facility];  Service: Orthopedics;  Laterality: Left;   HIP CLOSED REDUCTION Left 05/27/2024   Procedure: CLOSED REDUCTION, HIP AND APPLICATION TRACTION WITH PIN PLACEMENT;  Surgeon: Georgina Ozell LABOR, MD;  Location: [facility];  Service: Orthopedics;  Laterality: Left;   IRRIGATION AND DEBRIDEMENT POSTERIOR HIP Left 05/27/2024   Procedure: IRRIGATION AND DEBRIDEMENT, OPEN FRACTURE;  Surgeon: Georgina Ozell LABOR, MD;  Location: [facility];  Service: Orthopedics;  Laterality: Left;   OPEN REDUCTION INTERNAL FIXATION (ORIF) TIBIA/FIBULA FRACTURE Left  05/31/2024   Procedure: OPEN REDUCTION INTERNAL FIXATION (ORIF) OF LEFT DISTAL TIBIA/FIBULA FRACTURE;  Surgeon: Kendal Franky SQUIBB, MD;  Location: [facility];  Service: Orthopedics;  Laterality: Left;   OPEN REDUCTION INTERNAL FIXATION ACETABULUM POSTERIOR LATERAL Left 05/29/2024   Procedure: OPEN REDUCTION INTERNAL FIXATION ACETABULUM POSTERIOR LATERAL;  Surgeon: Kendal Franky SQUIBB, MD;  Location: [facility];  Service: Orthopedics;  Laterality: Left;   TOOTH EXTRACTION      Assessment & Plan Clinical Impression: TEOSHA [name] 43y/o right handed female with history significant for anemia, diet-controlled diabetes mellitus, hypertension, sleep apnea not on CPAP, quit smoking 15 years ago. Per chart review patient lives with spouse and 3 children ages 75, 70 and 74 as well as a 42 year old. Third floor apartment. Presented 05/27/2024 after reportedly jumping from a third story building. EMS reported the jump was intentional (suspect suicide attempt). Admission chemistries unremarkable except alcohol 186, urine drug screen negative, hemoglobin A1c 7.7, CO2 21, glucose 296, creatinine 1.14. Cranial CT scan negative. CT cervical spine negative. CT of the chest abdomen and pelvis showed left 3rd and 5th - seventh rib fractures. No pneumothorax. Posterior left hip dislocation with associated displaced acetabular fracture. Findings of left open tib-fib fracture as well as a CT left ankle showing markedly comminuted, open distal tibial fracture with displacement and intra-articular extension. Fracture fragments along the anterior lateral talus. Underwent ORIF left acetabular, external fixator left ankle 12/16, closed reduction and washout with VAC placement 12/17. Status post ORIF left tibia fibula and left malleolus fracture 12/19. Nonweightbearing left lower extremity. Patient also  underwent ORIF of posterior left hip dislocation with associated acetabular fracture 05/29/2024 per Dr. Kendal. Hospital course acute blood loss  anemia transfuse 1 unit packed red blood cells 12/18 with latest hemoglobin 8.1. Patient was cleared to begin Lovenox  for DVT prophylaxis 05/30/2024. Blood pressure did have some increased elevations placed on low-dose amlodipine  06/03/2024. Psychiatry/behavioral health was consulted in regards to suspect suicide attempt and followed as directed and currently maintained on Cymbalta . Therapy evaluations completed due to patient decreased functional mobility was admitted for a comprehensive rehab program.   Patient currently requires mod-max with basic self-care skills secondary to muscle weakness, decreased cardiorespiratoy endurance, decreased coordination and decreased motor planning, and decreased sitting balance, decreased standing balance, decreased postural control, and decreased balance strategies.  Prior to hospitalization, patient could complete BADL with modified independent .  Patient will benefit from skilled intervention to decrease level of assist with basic self-care skills and increase independence with basic self-care skills prior to discharge home with care partner.  Anticipate patient will require 24 hour supervision and follow up home health.  OT - End of Session Activity Tolerance: Tolerates 30+ min activity with multiple rests OT Assessment OT Barriers to Discharge: None OT Patient demonstrates impairments in the following area(s): Balance;Edema;Endurance;Pain OT Basic ADL's Functional Problem(s): Grooming;Bathing;Toileting;Dressing OT Advanced ADL's Functional Problem(s): Simple Meal Preparation OT Transfers Functional Problem(s): Toilet;Tub/Shower OT Additional Impairment(s): None OT Plan OT Intensity: Minimum of 1-2 x/day, 45 to 90 minutes OT Frequency: 5 out of 7 days OT Duration/Estimated Length of Stay: 10-12 OT Treatment/Interventions: Balance/vestibular training;Disease mangement/prevention;Neuromuscular re-education;Self Care/advanced ADL retraining;Therapeutic  Exercise;Wheelchair propulsion/positioning;Cognitive remediation/compensation;DME/adaptive equipment instruction;Pain management;Skin care/wound managment;UE/LE Strength taining/ROM;Community reintegration;Functional electrical stimulation;Patient/family education;Splinting/orthotics;UE/LE Coordination activities;Discharge planning;Functional mobility training;Psychosocial support;Therapeutic Activities;Visual/perceptual remediation/compensation OT Self Feeding Anticipated Outcome(s): no goal OT Basic Self-Care Anticipated Outcome(s): Supervision OT Toileting Anticipated Outcome(s): Supervision OT Bathroom Transfers Anticipated Outcome(s): Supervision OT Recommendation Recommendations for Other Services: Neuropsych consult;Therapeutic Recreation consult Therapeutic Recreation Interventions: Pet therapy;Stress management Patient destination: Home Follow Up Recommendations: Home health OT Equipment Recommended: To be determined   OT Evaluation Precautions/Restrictions  Precautions Precautions: Fall;Other (comment) Precaution/Restrictions Comments: L rib fxs, LLE NWB Restrictions Weight Bearing Restrictions Per Provider Order: Yes LLE Weight Bearing Per Provider Order: Non weight bearing General   Vital Signs Therapy Vitals Temp: 98 F (36.7 C) Pulse Rate: 84 Resp: 16 BP: 136/85 Patient Position (if appropriate): Lying Oxygen Therapy SpO2: 98 % O2 Device: Room Air Pain Pain Assessment Pain Scale: 0-10 Pain Score: 6  Faces Pain Scale: Hurts a little bit Pain Location: Leg Pain Intervention(s): Medication (See eMAR) PAINAD (Pain Assessment in Advanced Dementia) Breathing: normal Negative Vocalization: none Facial Expression: smiling or inexpressive Body Language: relaxed Consolability: no need to console PAINAD Score: 0 Home Living/Prior Functioning Home Living Family/patient expects to be discharged to:: Private residence Living Arrangements: Spouse/significant  other Available Help at Discharge: Family, Available PRN/intermittently Type of Home: Apartment Home Access: Level entry Home Layout: Multi-level Bathroom Shower/Tub: Engineer, Manufacturing Systems: Standard Additional Comments: lives on 3rd floor apartment. lives with husband and 3 kids (72, 31 & 36 y.o.; also has 27 y.o.)  Lives With: Spouse, Family Prior Function Level of Independence: Independent with basic ADLs, Independent with gait, Independent with transfers Vision Baseline Vision/History: 0 No visual deficits Ability to See in Adequate Light: 0 Adequate Patient Visual Report: No change from baseline Vision Assessment?: No apparent visual deficits Perception  Perception: Within Functional Limits Praxis Praxis: WFL Cognition Cognition Overall Cognitive Status: Within Functional Limits for tasks assessed  Arousal/Alertness: Awake/alert Orientation Level: Person;Place;Situation Person: Oriented Place: Oriented Situation: Oriented Problem Solving: Appears intact Safety/Judgment: Appears intact Brief Interview for Mental Status (BIMS) Repetition of Three Words (First Attempt): 3 Temporal Orientation: Year: Correct Temporal Orientation: Month: Accurate within 5 days Temporal Orientation: Day: Correct Recall: Sock: Yes, no cue required Recall: Blue: Yes, no cue required Recall: Bed: Yes, after cueing (a piece of furniture) BIMS Summary Score: 14 Sensation Sensation Light Touch: Appears Intact Coordination Gross Motor Movements are Fluid and Coordinated: No Fine Motor Movements are Fluid and Coordinated: Yes Coordination and Movement Description: limited by NWB for LLE Motor  Motor Motor: Within Functional Limits Motor - Skilled Clinical Observations: limited by pain and LLE NWB  Trunk/Postural Assessment  Cervical Assessment Cervical Assessment: Within Functional Limits Thoracic Assessment Thoracic Assessment: Within Functional Limits Lumbar  Assessment Lumbar Assessment: Exceptions to [facility] Postural Control Postural Control: Deficits on evaluation  Balance Balance Balance Assessed: Yes Static Sitting Balance Static Sitting - Balance Support: Feet supported Static Sitting - Level of Assistance: 5: Stand by assistance Dynamic Sitting Balance Dynamic Sitting - Balance Support: Feet unsupported Dynamic Sitting - Level of Assistance: 4: Min assist Dynamic Sitting - Balance Activities: Lateral lean/weight shifting;Forward lean/weight shifting Extremity/Trunk Assessment RUE Assessment RUE Assessment: Within Functional Limits LUE Assessment LUE Assessment: Exceptions to [facility] General Strength Comments: grossly limited by pain from L rib fractures  Care Tool Care Tool Self Care Eating   Eating Assist Level: Set up assist    Oral Care    Oral Care Assist Level: Set up assist    Bathing   Body parts bathed by patient: Right arm;Left arm;Chest;Abdomen;Right upper leg;Left upper leg;Face Body parts bathed by helper: Front perineal area;Buttocks;Right lower leg;Left lower leg   Assist Level: Maximal Assistance - Patient 24 - 49%    Upper Body Dressing(including orthotics)   What is the patient wearing?: Hospital gown only   Assist Level: Minimal Assistance - Patient > 75%    Lower Body Dressing (excluding footwear)   What is the patient wearing?: Hospital gown only Assist for lower body dressing: Minimal Assistance - Patient > 75%    Putting on/Taking off footwear   What is the patient wearing?: Non-skid slipper socks Assist for footwear: Dependent - Patient 0%       Care Tool Toileting Toileting activity   Assist for toileting: Maximal Assistance - Patient 25 - 49%     Care Tool Bed Mobility Roll left and right activity    MIN A    Sit to lying activity    MIN A    Lying to sitting on side of bed activity    MIN A     Care Tool Transfers Sit to stand transfer    Not attempted this date     Chair/bed  transfer   Chair/bed transfer assist level: Minimal Assistance - Patient > 75%     Toilet transfer   Assist Level: Minimal Assistance - Patient > 75%     Care Tool Cognition  Expression of Ideas and Wants Expression of Ideas and Wants: 4. Without difficulty (complex and basic) - expresses complex messages without difficulty and with speech that is clear and easy to understand  Understanding Verbal and Non-Verbal Content Understanding Verbal and Non-Verbal Content: 4. Understands (complex and basic) - clear comprehension without cues or repetitions   Memory/Recall Ability Memory/Recall Ability : Current season;That he or she is in a hospital/hospital unit   Refer to Care Plan for Long Term Goals  SHORT TERM GOAL WEEK 1 OT Short Term Goal 1 (Week 1): Pt will perform toilet transfer with CGA consistently OT Short Term Goal 2 (Week 1): Pt will perform LB dress with MIN A using AE PRN OT Short Term Goal 3 (Week 1): Pt will perform clothing management for toileting with MIN A  Recommendations for other services: Neuropsych and Therapeutic Recreation  Pet therapy and Stress management   Skilled Therapeutic Intervention ADL ADL Eating: Set up Grooming: Setup Upper Body Bathing: Supervision/safety Lower Body Bathing: Maximal assistance Upper Body Dressing: Supervision/safety Lower Body Dressing: Maximal assistance Toileting: Maximal assistance Toilet Transfer: Minimal assistance Toilet Transfer Method: Squat pivot Mobility     Skilled Interventions: Pt bed level at time of session, eager to get up and OOB. Note pt verbose and difficult to redirect during session, extended time for all tasks and OT having to redirect almost constantly. OT retrieving 20 wheelchair with cushion and ELR at beginning of session. Pt performing supine > sit MIN/CGA and educated on WB restrictions. Squat pivot bed > wheelchair MIN/CGA and wheelchair > toilet same manner needing more assist going to the L side.  Able to perform anterior hygiene, simulated LB dressing tasks on toilet and pt would need likely MOD/MAX for clothing. Set up at sink level and performing UB bathe/dress with gown only this date. Note extended time at end of session for MD visit. Pt up in wheelchair alarm on call bell in reach.   Discharge Criteria: Patient will be discharged from OT if patient refuses treatment 3 consecutive times without medical reason, if treatment goals not met, if there is a change in medical status, if patient makes no progress towards goals or if patient is discharged from hospital.  The above assessment, treatment plan, treatment alternatives and goals were discussed and mutually agreed upon: by patient  Chiquita JAYSON Hopping 06/09/2024, 8:39 AM

## 2024-06-09 NOTE — Progress Notes (Signed)
 "                                                        PROGRESS NOTE   Subjective/Complaints:  Pt doing ok, didn't sleep great because of pain-- unclear if she had gone too long without pain meds yesterday with moving rooms and such. Discussed how to try to stay on top of pain control.  Does state that pain meds help when she gets them, and pain is overall managed well. LBM yesterday per pt but not documented.  Urinating fine. No other complaints or concerns.   ROS: as per HPI. Denies CP, SOB, abd pain, N/V/D/C, or any other complaints at this time.    Objective:   No results found. Recent Labs    06/08/24 1917  WBC 11.0*  HGB 8.6*  HCT 26.6*  PLT 500*   Recent Labs    06/08/24 1917  CREATININE 0.64     No intake or output data in the 24 hours ending 06/09/24 1139      Physical Exam: Vital Signs Blood pressure 136/85, pulse 84, temperature 98 F (36.7 C), resp. rate 16, height 5' 4 (1.626 m), weight 103.4 kg, last menstrual period 05/20/2024, SpO2 98%.  Constitutional:      Appearance: Normal appearance. She is obese.     Comments: Sitting up in w/c at sink; her husband at bedside, bright affect, NAD  HENT:     Head: Normocephalic.     Comments: Some abrasions    Right Ear: External ear normal.     Left Ear: External ear normal.     Nose: Nose normal. No congestion.     Mouth/Throat:     Mouth: Mucous membranes are moist.    Pharynx: Oropharynx is clear. No oropharyngeal exudate.  Eyes:     General:        Right eye: No discharge.        Left eye: No discharge.     Extraocular Movements: Extraocular movements intact.  Cardiovascular:     Rate and Rhythm: Normal rate and regular rhythm.     Heart sounds: Normal heart sounds. No murmur heard.    No gallop.  Pulmonary:     Effort: Pulmonary effort is normal. No respiratory distress.     Breath sounds: Normal breath sounds. No wheezing, rhonchi or rales.  Abdominal:     General: Bowel sounds are normal.  There is no distension.     Palpations: Abdomen is soft.     Tenderness: There is no abdominal tenderness.  Skin:    General: Skin is warm and dry. Psychiatric:     Comments: Bright mood- almost hypertalkative  PRIOR EXAMS: Musculoskeletal:     Cervical back: Neck supple.     Comments: Ue's 5/5 throughout B/L RLE 5-/5 throughout- esp HF LLE- hard to test with NWB throughout, but at least 3/5- and EHL 4/5- can lift against gravity except NT on L ankle due to cast/ACE wrap      Neurological:     Comments: Patient is alert and oriented x 3 following commands and cooperative with exam Very mild numbness/tingling in L toes, but wiggles with no difficulty- are warm Ox3- following commands    Assessment/Plan: 1. Functional deficits which require 3+ hours per day of interdisciplinary therapy in a  comprehensive inpatient rehab setting. Physiatrist is providing close team supervision and 24 hour management of active medical problems listed below. Physiatrist and rehab team continue to assess barriers to discharge/monitor patient progress toward functional and medical goals  Care Tool:  Bathing              Bathing assist       Upper Body Dressing/Undressing Upper body dressing        Upper body assist      Lower Body Dressing/Undressing Lower body dressing            Lower body assist       Toileting Toileting    Toileting assist       Transfers Chair/bed transfer  Transfers assist           Locomotion Ambulation   Ambulation assist              Walk 10 feet activity   Assist           Walk 50 feet activity   Assist           Walk 150 feet activity   Assist           Walk 10 feet on uneven surface  activity   Assist           Wheelchair     Assist               Wheelchair 50 feet with 2 turns activity    Assist            Wheelchair 150 feet activity     Assist          Blood  pressure 136/85, pulse 84, temperature 98 F (36.7 C), resp. rate 16, height 5' 4 (1.626 m), weight 103.4 kg, last menstrual period 05/20/2024, SpO2 98%.  Medical Problem List and Plan: 1. Functional deficits secondary to multitrauma after jumping from a third level building/suspect suicide attempt 05/27/2024             -patient may  shower- cover LLE             -ELOS/Goals: 10-12 days- supervision- NWB LLE  -Continue CIR 2.  Antithrombotics: -DVT/anticoagulation:  Pharmaceutical: Lovenox  30mg  BID.  Check vascular study. Transition to Eliquis versus Xarelto at discharge x 30 days             -antiplatelet therapy: N/A 3. Pain Management: Neurontin  100 mg 3 times daily, Robaxin  as needed as well as oxycodone  5 to 10 mg every 4 hours as needed- will add Lidoderm  3 patches 12 hrs on;12 hrs off- pain controlled 4. Mood/Behavior/Sleep: Cymbalta  20 mg daily, melatonin 5 mg nightly and (psychiatry follow-up)- suggest Neuropsych for pt without family in room.              -antipsychotic agents: N/A 5. Neuropsych/cognition: This patient is capable of making decisions on her own behalf. 6. Skin/Wound Care: Routine skin checks 7. Fluids/Electrolytes/Nutrition: Routine in and outs with follow-up chemistries, cont vitamins/supplements.  8.  Left rib fractures 3/5-7.  Pain control conservative care 9.  Left open tib-fib fracture.  Status post ORIF left acetabulum, external fixator left ankle 12/16, closed reduction and washout with VAC placement 12/17.  Status post ORIF left tibia fibula and left malleolus fracture 12/19.  Nonweightbearing.  Follow-up Dr. Kendal 10.  Posterior left hip dislocation with associated acetabular fracture.  Status post ORIF 12/17. 11.  Acute blood loss anemia.  Follow-up CBC  with stable Hgb 8.6. Monitor.  12.  Hypertension.  Amlodipine  5 mg daily.  Monitor with increased mobility  -06/09/24 BPs stable, monitor.  Vitals:   06/08/24 1757 06/08/24 2051 06/09/24 0455  BP: (!)  132/90 135/79 136/85    13.  Constipation.  Colace 100 mg twice daily, MiraLAX  twice daily.  -06/09/24 LBM yesterday per pt, not documented.  14.  Diabetes mellitus.  Hemoglobin A1c 7.7.  Currently on SSI. Needs additional meds long term. Consider Metformin ? CBG (last 3)  Recent Labs    06/08/24 1622 06/09/24 0614 06/09/24 1132  GLUCAP 160* 202* 168*      I spent >50mins performing patient care related activities, including prolonged face to face time, documentation time, med and results review, discussion of pain med regimen with patient, and overall coordination of care.   LOS: 1 days A FACE TO FACE EVALUATION WAS PERFORMED  [address] 06/09/2024, 11:39 AM     "

## 2024-06-09 NOTE — Progress Notes (Signed)
 Occupational Therapy Session Note  Patient Details  Name: Caitlyn [name] MRN: [id number] Date of Birth: May 25, 1981  {CHL IP REHAB OT TIME CALCULATIONS:[id number]}   Short Term Goals: Week 1:  OT Short Term Goal 1 (Week 1): Pt will perform toilet transfer with CGA consistently OT Short Term Goal 2 (Week 1): Pt will perform LB dress with MIN A using AE PRN OT Short Term Goal 3 (Week 1): Pt will perform clothing management for toileting with MIN A  Skilled Therapeutic Interventions/Progress Updates:  Pt greeted *** for skilled OT session with focus on ***.   Pain: Pt reported ***/10 pain, stating *** in reference to ***. OT offering intermediate rest breaks and positioning suggestions throughout session to address pain/fatigue and maximize participation/safety in session.   Functional Transfers:  Self Care Tasks: Pt completes the following self care tasks with levels of assistance noted below, UB: LB:   Therapeutic Activities:  Therapeutic Exercise:   Education:  Pt remained *** with 4Ps assessed and immediate needs met. Pt continues to be appropriate for skilled OT intervention to promote further functional independence in ADLs/IADLs.    Therapy Documentation Precautions:  Precautions Precautions: Fall, Other (comment) (NWB LLE; OK for hip and knee ROM per chart review; prior pain history R LE (significant)) Recall of Precautions/Restrictions: Intact Precaution/Restrictions Comments: rib fxs, LLE NWB; L hip/pelvic fx Required Braces or Orthoses: Splint/Cast Splint/Cast: L lower leg Restrictions Weight Bearing Restrictions Per Provider Order: Yes LLE Weight Bearing Per Provider Order: Non weight bearing   Therapy/Group: Individual Therapy  Nereida Habermann, OTR/L, MSOT  06/09/2024, 9:28 PM

## 2024-06-10 ENCOUNTER — Ambulatory Visit (HOSPITAL_COMMUNITY): Attending: Physician Assistant

## 2024-06-10 DIAGNOSIS — R7989 Other specified abnormal findings of blood chemistry: Secondary | ICD-10-CM

## 2024-06-10 DIAGNOSIS — K5900 Constipation, unspecified: Secondary | ICD-10-CM

## 2024-06-10 DIAGNOSIS — Z794 Long term (current) use of insulin: Secondary | ICD-10-CM

## 2024-06-10 DIAGNOSIS — M7989 Other specified soft tissue disorders: Secondary | ICD-10-CM | POA: Diagnosis not present

## 2024-06-10 DIAGNOSIS — D62 Acute posthemorrhagic anemia: Secondary | ICD-10-CM

## 2024-06-10 DIAGNOSIS — I10 Essential (primary) hypertension: Secondary | ICD-10-CM

## 2024-06-10 LAB — CBC WITH DIFFERENTIAL/PLATELET
Abs Immature Granulocytes: 0.06 K/uL (ref 0.00–0.07)
Basophils Absolute: 0 K/uL (ref 0.0–0.1)
Basophils Relative: 0 %
Eosinophils Absolute: 0.2 K/uL (ref 0.0–0.5)
Eosinophils Relative: 2 %
HCT: 25.5 % — ABNORMAL LOW (ref 36.0–46.0)
Hemoglobin: 8.4 g/dL — ABNORMAL LOW (ref 12.0–15.0)
Immature Granulocytes: 1 %
Lymphocytes Relative: 18 %
Lymphs Abs: 1.6 K/uL (ref 0.7–4.0)
MCH: 26.9 pg (ref 26.0–34.0)
MCHC: 32.9 g/dL (ref 30.0–36.0)
MCV: 81.7 fL (ref 80.0–100.0)
Monocytes Absolute: 0.7 K/uL (ref 0.1–1.0)
Monocytes Relative: 9 %
Neutro Abs: 6.1 K/uL (ref 1.7–7.7)
Neutrophils Relative %: 70 %
Platelets: 481 K/uL — ABNORMAL HIGH (ref 150–400)
RBC: 3.12 MIL/uL — ABNORMAL LOW (ref 3.87–5.11)
RDW: 14 % (ref 11.5–15.5)
WBC: 8.6 K/uL (ref 4.0–10.5)
nRBC: 0 % (ref 0.0–0.2)

## 2024-06-10 LAB — COMPREHENSIVE METABOLIC PANEL WITH GFR
ALT: 78 U/L — ABNORMAL HIGH (ref 0–44)
AST: 47 U/L — ABNORMAL HIGH (ref 15–41)
Albumin: 3.5 g/dL (ref 3.5–5.0)
Alkaline Phosphatase: 83 U/L (ref 38–126)
Anion gap: 8 (ref 5–15)
BUN: 11 mg/dL (ref 6–20)
CO2: 30 mmol/L (ref 22–32)
Calcium: 9.1 mg/dL (ref 8.9–10.3)
Chloride: 100 mmol/L (ref 98–111)
Creatinine, Ser: 0.55 mg/dL (ref 0.44–1.00)
GFR, Estimated: >60 mL/min
Glucose, Bld: 215 mg/dL — ABNORMAL HIGH (ref 70–99)
Potassium: 4.2 mmol/L (ref 3.5–5.1)
Sodium: 137 mmol/L (ref 135–145)
Total Bilirubin: 0.4 mg/dL (ref 0.0–1.2)
Total Protein: 6.4 g/dL — ABNORMAL LOW (ref 6.5–8.1)

## 2024-06-10 LAB — GLUCOSE, CAPILLARY
Glucose-Capillary: 208 mg/dL — ABNORMAL HIGH (ref 70–99)
Glucose-Capillary: 187 mg/dL — ABNORMAL HIGH (ref 70–99)
Glucose-Capillary: 140 mg/dL — ABNORMAL HIGH (ref 70–99)
Glucose-Capillary: 266 mg/dL — ABNORMAL HIGH (ref 70–99)

## 2024-06-10 MED ORDER — LIVING WELL WITH DIABETES BOOK
Freq: Once | Status: AC
  Filled 2024-06-10: qty 1

## 2024-06-10 MED ORDER — ACETAMINOPHEN 500 MG PO TABS
500.0000 mg | ORAL_TABLET | Freq: Four times a day (QID) | ORAL | Status: DC
  Administered 2024-06-10 – 2024-06-18 (×30): 500 mg via ORAL
  Filled 2024-06-10 (×26): qty 1

## 2024-06-10 NOTE — Progress Notes (Signed)
 "                                                        PROGRESS NOTE   Subjective/Complaints: Reports she has some pain in the AM, improves with medications and overall controlled.  LBM yesterday per patient.   ROS: as per HPI. Denies fevers, CP, SOB, abd pain, N/V/D/C, or any other complaints at this time.    Objective:   No results found. Recent Labs    06/08/24 1917 06/10/24 0552  WBC 11.0* 8.6  HGB 8.6* 8.4*  HCT 26.6* 25.5*  PLT 500* 481*   Recent Labs    06/08/24 1917 06/10/24 0552  NA  --  137  K  --  4.2  CL  --  100  CO2  --  30  GLUCOSE  --  215*  BUN  --  11  CREATININE 0.64 0.55  CALCIUM   --  9.1      Intake/Output Summary (Last 24 hours) at 06/10/2024 1037 Last data filed at 06/10/2024 0814 Gross per 24 hour  Intake 240 ml  Output --  Net 240 ml        Physical Exam: Vital Signs Blood pressure 138/88, pulse 81, temperature 98.6 F (37 C), temperature source Oral, resp. rate 18, height 5' 4 (1.626 m), weight 103.4 kg, last menstrual period 05/20/2024, SpO2 100%.  Constitutional:      Appearance: Normal appearance. She is obese.     Comments: her husband at bedside, bright affect, NAD  HENT:     Head: Normocephalic.     Comments: Some abrasions    Right Ear: External ear normal.     Left Ear: External ear normal.     Nose: Nose normal. No congestion.     Mouth/Throat:     Mouth: Mucous membranes are moist.    Pharynx: Oropharynx is clear. No oropharyngeal exudate.  Eyes:     General:        Right eye: No discharge.        Left eye: No discharge.     Extraocular Movements: Extraocular movements intact.  Cardiovascular:     Rate and Rhythm: Normal rate and regular rhythm.     Heart sounds: Normal heart sounds. No murmur heard.    No gallop.  Pulmonary:     Effort: Pulmonary effort is normal. No respiratory distress.     Breath sounds: Normal breath sounds. No wheezing, rhonchi or rales.  Abdominal:     General: Bowel sounds are  normal. There is no distension.     Palpations: Abdomen is soft.     Tenderness: There is no abdominal tenderness.  Skin:    General: Skin is warm and dry. Psychiatric:     Comments: Bright mood- almost hypertalkative  PRIOR EXAMS: Musculoskeletal:     Cervical back: Neck supple.     Comments: Ue's 5/5 throughout B/L RLE 5-/5 throughout- esp HF LLE- hard to test with NWB throughout, but at least 3/5- and EHL 4/5- can lift against gravity except NT on L ankle due to cast/ACE wrap      Neurological:     Comments: Patient is alert and oriented x 3 following commands and cooperative with exam Very mild numbness/tingling in L toes, but wiggles with no difficulty- are warm Ox3- following  commands   Prior neuro assessment is c/w today's exam 06/10/2024.    Assessment/Plan: 1. Functional deficits which require 3+ hours per day of interdisciplinary therapy in a comprehensive inpatient rehab setting. Physiatrist is providing close team supervision and 24 hour management of active medical problems listed below. Physiatrist and rehab team continue to assess barriers to discharge/monitor patient progress toward functional and medical goals  Care Tool:  Bathing    Body parts bathed by patient: Right arm, Left arm, Chest, Abdomen, Right upper leg, Left upper leg, Face   Body parts bathed by helper: Front perineal area, Buttocks, Right lower leg, Left lower leg     Bathing assist Assist Level: Maximal Assistance - Patient 24 - 49%     Upper Body Dressing/Undressing Upper body dressing   What is the patient wearing?: Hospital gown only    Upper body assist Assist Level: Minimal Assistance - Patient > 75%    Lower Body Dressing/Undressing Lower body dressing      What is the patient wearing?: Hospital gown only     Lower body assist Assist for lower body dressing: Minimal Assistance - Patient > 75%     Toileting Toileting    Toileting assist Assist for toileting: Maximal  Assistance - Patient 25 - 49%     Transfers Chair/bed transfer  Transfers assist  Chair/bed transfer activity did not occur: Safety/medical concerns  Chair/bed transfer assist level: Contact Guard/Touching assist     Locomotion Ambulation   Ambulation assist   Ambulation activity did not occur: Safety/medical concerns          Walk 10 feet activity   Assist  Walk 10 feet activity did not occur: Safety/medical concerns        Walk 50 feet activity   Assist Walk 50 feet with 2 turns activity did not occur: Safety/medical concerns         Walk 150 feet activity   Assist Walk 150 feet activity did not occur: Safety/medical concerns         Walk 10 feet on uneven surface  activity   Assist Walk 10 feet on uneven surfaces activity did not occur: Safety/medical concerns         Wheelchair     Assist Is the patient using a wheelchair?: Yes Type of Wheelchair: Manual    Wheelchair assist level: Supervision/Verbal cueing Max wheelchair distance: 156t    Wheelchair 50 feet with 2 turns activity    Assist        Assist Level: Supervision/Verbal cueing   Wheelchair 150 feet activity     Assist      Assist Level: Supervision/Verbal cueing   Blood pressure 138/88, pulse 81, temperature 98.6 F (37 C), temperature source Oral, resp. rate 18, height 5' 4 (1.626 m), weight 103.4 kg, last menstrual period 05/20/2024, SpO2 100%.  Medical Problem List and Plan: 1. Functional deficits secondary to multitrauma after jumping from a third level building/suspect suicide attempt 05/27/2024             -patient may  shower- cover LLE             -ELOS/Goals: 10-12 days- supervision- NWB LLE  -Continue CIR 2.  Antithrombotics: -DVT/anticoagulation:  Pharmaceutical: Lovenox  30mg  BID.  Check vascular study. Transition to Eliquis versus Xarelto at discharge x 30 days             -antiplatelet therapy: N/A 3. Pain Management: Neurontin  100 mg  3 times daily, Robaxin  as needed as  well as oxycodone  5 to 10 mg every 4 hours as needed- will add Lidoderm  3 patches 12 hrs on;12 hrs off- pain controlled  -12/29 pain overall under control, continue current regimen 4. Mood/Behavior/Sleep: Cymbalta  20 mg daily, melatonin 5 mg nightly and (psychiatry follow-up)- suggest Neuropsych for pt without family in room.              -antipsychotic agents: N/A 5. Neuropsych/cognition: This patient is capable of making decisions on her own behalf. 6. Skin/Wound Care: Routine skin checks 7. Fluids/Electrolytes/Nutrition: Routine in and outs with follow-up chemistries, cont vitamins/supplements.  8.  Left rib fractures 3/5-7.  Pain control conservative care 9.  Left open tib-fib fracture.  Status post ORIF left acetabulum, external fixator left ankle 12/16, closed reduction and washout with VAC placement 12/17.  Status post ORIF left tibia fibula and left malleolus fracture 12/19.  Nonweightbearing.  Follow-up Dr. Kendal 10.  Posterior left hip dislocation with associated acetabular fracture.  Status post ORIF 12/17. 11.  Acute blood loss anemia.   12/29 stable hgb 8.4, monitor 12.  Hypertension.  Amlodipine  5 mg daily.  Monitor with increased mobility  -12/28/-29 BPs stable, monitor.  Vitals:   06/08/24 1757 06/08/24 2051 06/09/24 0455 06/09/24 1445  BP: (!) 132/90 135/79 136/85 110/79   06/09/24 2049 06/10/24 0644  BP: 128/80 138/88    13.  Constipation.  Colace 100 mg twice daily, MiraLAX  twice daily.  -12/29 LBM yesterday, continue to monitor  14.  Diabetes mellitus.  Hemoglobin A1c 7.7.  Currently on SSI. Needs additional meds long term. Consider Metformin ? CBG (last 3)  Recent Labs    06/09/24 1719 06/09/24 2046 06/10/24 0640  GLUCAP 201* 171* 208*    15. Elevated LFTs  -12/29 decrease tylenol  dose, monitor      LOS: 2 days A FACE TO FACE EVALUATION WAS PERFORMED  Murray Collier 06/10/2024, 10:37 AM     "

## 2024-06-10 NOTE — Progress Notes (Signed)
 Inpatient Rehabilitation  Patient information reviewed and entered into eRehab system by Burnard Mealing, OTR/L, Rehab Quality Coordinator.   Information including medical coding, functional ability and quality indicators will be reviewed and updated through discharge.

## 2024-06-10 NOTE — Progress Notes (Signed)
 Patient ID: Caitlyn [name], female   DOB: [birth year], 43 y.o.   MRN: [id number] Met with the patient to review current medical situation, rehab process, team conference and plan of care post fall with polytrauma. Patient noted new knee cap pain; pain is usually managed with prn meds and muscle relaxers; declined icing of knee above splint cast. Edema of toes but good circulation noted. ORIF left acetabular fx; incision on hip.  Patient with little information about new diagnosis of DM (A1C 7.7) and had just been started on metformin  but had stopped taking PTA. Given information about bone health. Has been seen by diabetes coordinator; diatician consult added for education on food selection for CMM diet. Continue to follow along to address educational needs to facilitate preparation for discharge. Fredericka Barnie NOVAK

## 2024-06-10 NOTE — Progress Notes (Signed)
 Physical Therapy Session Note  Patient Details  Name: Caitlyn [name] MRN: [id number] Date of Birth: 27-Aug-1980  Today's Date: 06/10/2024 PT Individual Time: 0802-0858 PT Individual Time Calculation (min): 56 min   Short Term Goals: Week 1:  PT Short Term Goal 1 (Week 1): Pt will be MI with supine to sit to supine transfer PT Short Term Goal 2 (Week 1): Pt will be MI with squat pivot transfer to/from wheelchair to bed/mat table PT Short Term Goal 3 (Week 1): Pt will be able to perform wheelchair mobility > 300 ft with MI without rest PT Short Term Goal 4 (Week 1): Pt will be able to stand x 2 min LLE NWB to assist with functional RLE strengthening  Skilled Therapeutic Interventions/Progress Updates:     Pt received semi reclined in bed and agrees to therapy. Reports 7/10 pain in LLE. Reports having pain meds prior to session. PT provides rest breaks as needed to help manage pain. Pt performs bed mobility with cues for sequencing and positioning. Squat pivot from bed to [facility] with CGA and cues for hand placement and sequencing. WC transport to gym. Pt performs squat pivot to mat tabl ewith similar assistance and cues. PT educates pt on precautions and importance of strengthening RLE to be able to perform necessary mobility to discharge home to 3rd floor apartment with multiple flights of steps. PT demonstrates sit to stand tranfser with use of RW and pt agreeable to attempt. Pt then completes 3x20 heel raises with RLE with BUE support and cues for body mechanics and correct performance.   PT demonstrates using RW for ambulation and performing RLE lifts while standing. Pt attempts and PT notes that pt is putting weight through LLE. PT educates on importance of maintaining NWB and suggests flexing Lt knee in standing to maintain NWB. Pt has improved ability to complete on subsequent attempt and is able to progress to ambulation. Pt ambulates x10' with minA and RW, with cues or body mechanics, safe RW  management, and positioning for transition back to mat table.   Pt progresses to performing step ups onto 1.5 step with RW and minA. Pt completes x5 with minA and cues for body mechanics and sequencing. WC transport back to room. Left seated with WC and all needs within reach.  Therapy Documentation Precautions:  Precautions Precautions: Fall, Other (comment) (NWB LLE; OK for hip and knee ROM per chart review; prior pain history R LE (significant)) Recall of Precautions/Restrictions: Intact Precaution/Restrictions Comments: rib fxs, LLE NWB; L hip/pelvic fx Required Braces or Orthoses: Splint/Cast Splint/Cast: L lower leg Restrictions Weight Bearing Restrictions Per Provider Order: Yes LLE Weight Bearing Per Provider Order: Non weight bearing   Therapy/Group: Individual Therapy  Elsie JAYSON Dawn, PT, DPT 06/10/2024, 4:21 PM

## 2024-06-10 NOTE — Progress Notes (Signed)
 Inpatient Rehabilitation Center Individual Statement of Services  Patient Name:  Caitlyn [name]  Date:  06/10/2024  Welcome to the Inpatient Rehabilitation Center.  Our goal is to provide you with an individualized program based on your diagnosis and situation, designed to meet your specific needs.  With this comprehensive rehabilitation program, you will be expected to participate in at least 3 hours of rehabilitation therapies Monday-Friday, with modified therapy programming on the weekends.  Your rehabilitation program will include the following services:  Physical Therapy (PT), Occupational Therapy (OT), Speech Therapy (ST), 24 hour per day rehabilitation nursing, Therapeutic Recreaction (TR), Neuropsychology, Care Coordinator, Rehabilitation Medicine, Nutrition Services, and Pharmacy Services  Weekly team conferences will be held on Wenesday to discuss your progress.  Your Inpatient Rehabilitation Care Coordinator will talk with you frequently to get your input and to update you on team discussions.  Team conferences with you and your family in attendance may also be held.  Expected length of stay: 10-12 days  Overall anticipated outcome: Supervision  Depending on your progress and recovery, your program may change. Your Inpatient Rehabilitation Care Coordinator will coordinate services and will keep you informed of any changes. Your Inpatient Rehabilitation Care Coordinator's name and contact numbers are listed  below.  The following services may also be recommended but are not provided by the Inpatient Rehabilitation Center:  Driving Evaluations Home Health Rehabiltiation Services Outpatient Rehabilitation Services Vocational Rehabilitation   Arrangements will be made to provide these services after discharge if needed.  Arrangements include referral to agencies that provide these services.  Your insurance has been verified to be:  AET[employer] / [employer]ET[employer] Your primary doctor is:   Oley Bascom RAMAN, NP   Pertinent information will be shared with your doctor and your insurance company.  Inpatient Rehabilitation Care Coordinator:  [name] SIERRAS [phone] or Caitlyn [name]  Information discussed with and copy given to patient by: Waverly Loreli, 06/10/2024, 10:44 AM

## 2024-06-10 NOTE — Progress Notes (Signed)
 Patient ID: Caitlyn [name], female   DOB: [birth year], 43 y.o.   MRN: [id number]  Statement of service delivered.

## 2024-06-10 NOTE — Progress Notes (Signed)
 " Inpatient Rehabilitation Care Coordinator Assessment and Plan Patient Details  Name: Caitlyn [name] MRN: [id number] Date of Birth: Jun 21, 1980  Today's Date: 06/10/2024  Hospital Problems: Principal Problem:   Multiple traumatic injuries Active Problems:   Critical polytrauma  Past Medical History:  Past Medical History:  Diagnosis Date   Anemia    Diabetes mellitus without complication (HCC)    GM - Diet controlled - no meds   Hypertension    PIH with pregnancy   Sleep apnea    does not use CPAP - lost weight   SVD (spontaneous vaginal delivery)    x 2   Past Surgical History:  Past Surgical History:  Procedure Laterality Date   CESAREAN SECTION  03/13/2010   CESAREAN SECTION WITH BILATERAL TUBAL LIGATION N/A 02/25/2014   Procedure: CESAREAN SECTION WITH BILATERAL TUBAL LIGATION;  Surgeon: Lang JINNY Peel, DO;  Location: [facility];  Service: Obstetrics;  Laterality: N/A;   EXTERNAL FIXATION LEG Left 05/27/2024   Procedure: EXTERNAL FIXATION, LOWER EXTREMITY;  Surgeon: Georgina Ozell LABOR, MD;  Location: [facility];  Service: Orthopedics;  Laterality: Left;   EXTERNAL FIXATION, ANKLE Left 05/29/2024   Procedure: ADJUSTMENT EXTERNAL FIXATION, ANKLE;  Surgeon: Kendal Franky SQUIBB, MD;  Location: [facility];  Service: Orthopedics;  Laterality: Left;   HIP CLOSED REDUCTION Left 05/27/2024   Procedure: CLOSED REDUCTION, HIP AND APPLICATION TRACTION WITH PIN PLACEMENT;  Surgeon: Georgina Ozell LABOR, MD;  Location: [facility];  Service: Orthopedics;  Laterality: Left;   IRRIGATION AND DEBRIDEMENT POSTERIOR HIP Left 05/27/2024   Procedure: IRRIGATION AND DEBRIDEMENT, OPEN FRACTURE;  Surgeon: Georgina Ozell LABOR, MD;  Location: [facility];  Service: Orthopedics;  Laterality: Left;   OPEN REDUCTION INTERNAL FIXATION (ORIF) TIBIA/FIBULA FRACTURE Left 05/31/2024   Procedure: OPEN REDUCTION INTERNAL FIXATION (ORIF) OF LEFT DISTAL TIBIA/FIBULA FRACTURE;  Surgeon: Kendal Franky SQUIBB, MD;  Location: [facility];  Service: Orthopedics;   Laterality: Left;   OPEN REDUCTION INTERNAL FIXATION ACETABULUM POSTERIOR LATERAL Left 05/29/2024   Procedure: OPEN REDUCTION INTERNAL FIXATION ACETABULUM POSTERIOR LATERAL;  Surgeon: Kendal Franky SQUIBB, MD;  Location: [facility];  Service: Orthopedics;  Laterality: Left;   TOOTH EXTRACTION     Social History:  reports that she quit smoking about 16 years ago. Her smoking use included cigarettes. She started smoking about 26 years ago. She has a 2.5 pack-year smoking history. She has never used smokeless tobacco. She reports current alcohol use. She reports that she does not use drugs.  Family / Support Systems Marital Status: Married Patient Roles: Spouse, Parent Spouse/Significant Other: Maurice Children: 25 y/o son, 19 y/o daughter, 36 y/o son, 59 y/o son Other Supports: MIL Anticipated Caregiver: Maurice Ability/Limitations of Caregiver: Husband works but PEPSICO is available and can assist and provide supervision Caregiver Availability: 24/7 Family Dynamics: Good family support  Social History Preferred language: English Religion: Christian Education: High school Health Literacy - How often do you need to have someone help you when you read instructions, pamphlets, or other written material from your doctor or pharmacy?: Never Writes: Yes Employment Status: Disabled Date Retired/Disabled/Unemployed: A few months ago   Abuse/Neglect Abuse/Neglect Assessment Can Be Completed: Yes Physical Abuse: Denies Verbal Abuse: Denies Sexual Abuse: Denies Exploitation of patient/patient's resources: Denies Self-Neglect: Denies  Patient response to: Social Isolation - How often do you feel lonely or isolated from those around you?: Never  Emotional Status Pt's affect, behavior and adjustment status: Adjusting very well to therapy Recent Psychosocial Issues: None Psychiatric History: None Substance Abuse  History: Former smoker  Patient / Field Seismologist, Expectations & Goals Pt/Family  understanding of illness & functional limitations: Patient/family understanding of illness & functional limitations Premorbid pt/family roles/activities: Active in the community, employed Anticipated changes in roles/activities/participation: Not working at the current moment due to disability 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, Trade Union Agencies: None Premorbid Home Care/DME Agencies:  [employer]) Transportation available at discharge: Yes Is the patient able to respond to transportation needs?: Yes In the past 12 months, has lack of transportation kept you from medical appointments or from getting medications?: No In the past 12 months, has lack of transportation kept you from meetings, work, or from getting things needed for daily living?: No Resource referrals recommended: Neuropsychology (12/31)  Discharge Planning Living Arrangements: Spouse/significant other, Children Support Systems: Spouse/significant other, Children, Other relatives Type of Residence: Private residence Insurance Resources: Media Planner (specify) (AETNA / AETNA [facility]) Financial Resources: Employment, Garment/textile Technologist Screen Referred: Yes Living Expenses: Rent Money Management: Patient, Spouse Does the patient have any problems obtaining your medications?: No Home Management: Patient/spouse manage home Patient/Family Preliminary Plans: Patient plans to return home with family providing 24/7 care and support Care Coordinator Anticipated Follow Up Needs: HH/OP Expected length of stay: 10-12 days  Clinical Impression CSW met with patient/family to introduce herself and complete initial assessment. Patient presented to [facility] following Multiple traumatic injuries. Patient is able to make needs known. Caitlyn [name] lives in an apartment on the 3rd floor of the building with her spouse and their 3 children. 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 She was  working there before her PCP encouraged her to rest due to pre-existing conditions surrounding her right hip and knee. Caitlyn [name] is motivated to regain independence. She inquired about HH therapies to start following discharge.   There were no further needs or concerns at present. CSW will follow up with family and continue to follow. Will provide patient/family with an update as soon as one becomes available.   Di'Asia  Loreli 06/10/2024, 1:07 PM    "

## 2024-06-10 NOTE — Progress Notes (Signed)
 BLE venous duplex has been completed.   Results can be found under chart review under CV PROC. 06/10/2024 5:30 PM Anisia Leija RVT, RDMS

## 2024-06-10 NOTE — Progress Notes (Signed)
 Occupational Therapy Session Note  Patient Details  Name: Caitlyn [name] MRN: [id number] Date of Birth: 16-Sep-1980  Today's Date: 06/10/2024 OT Individual Time: 1400-1458 OT Individual Time Calculation (min): 58 min    Short Term Goals: Week 1:  OT Short Term Goal 1 (Week 1): Pt will perform toilet transfer with CGA consistently OT Short Term Goal 2 (Week 1): Pt will perform LB dress with MIN A using AE PRN OT Short Term Goal 3 (Week 1): Pt will perform clothing management for toileting with MIN A  Skilled Therapeutic Interventions/Progress Updates:    Pt received sitting on the toilet, no c/o pain. She continued with toileting tasks for several minutes and then was able to complete all hygiene with (S). She transferred back to the w/c with CGA, good adherence to LLE NWB. Pt was taken via w/c to the therapy gym for time management. Pt completed the BUE ergometer to challenge BUE strength and endurance needed to complete ADLs and IADLs with the highest level of independence. Pt completed 10 min with cueing for pacing and technique. She then completed static standing balance activity at the BITS with unilateral support on the RW for carryover to balance during LB dressing/toileting tasks.Two 2 minute trials completed with a seated rest break between. Reaction time 1.11 sec and .88 sec . Min cueing for LLE NWB precautions. She returned to her room following. Husband present.   Therapy Documentation Precautions:  Precautions Precautions: Fall, Other (comment) (NWB LLE; OK for hip and knee ROM per chart review; prior pain history R LE (significant)) Recall of Precautions/Restrictions: Intact Precaution/Restrictions Comments: rib fxs, LLE NWB; L hip/pelvic fx Required Braces or Orthoses: Splint/Cast Splint/Cast: L lower leg Restrictions Weight Bearing Restrictions Per Provider Order: Yes LLE Weight Bearing Per Provider Order: Non weight bearing  Therapy/Group: Individual Therapy  [name] 06/10/2024, 2:16 PM

## 2024-06-11 DIAGNOSIS — E1165 Type 2 diabetes mellitus with hyperglycemia: Secondary | ICD-10-CM

## 2024-06-11 DIAGNOSIS — M25552 Pain in left hip: Secondary | ICD-10-CM

## 2024-06-11 LAB — GLUCOSE, CAPILLARY
Glucose-Capillary: 227 mg/dL — ABNORMAL HIGH (ref 70–99)
Glucose-Capillary: 191 mg/dL — ABNORMAL HIGH (ref 70–99)
Glucose-Capillary: 204 mg/dL — ABNORMAL HIGH (ref 70–99)
Glucose-Capillary: 239 mg/dL — ABNORMAL HIGH (ref 70–99)

## 2024-06-11 MED ORDER — OXYCODONE HCL 5 MG PO TABS
5.0000 mg | ORAL_TABLET | ORAL | Status: DC | PRN
  Administered 2024-06-11 – 2024-06-15 (×14): 10 mg via ORAL
  Administered 2024-06-16: 5 mg via ORAL
  Administered 2024-06-16 – 2024-06-18 (×5): 10 mg via ORAL
  Filled 2024-06-11 (×20): qty 2

## 2024-06-11 MED ORDER — GABAPENTIN 100 MG PO CAPS
200.0000 mg | ORAL_CAPSULE | Freq: Three times a day (TID) | ORAL | Status: DC
  Administered 2024-06-11 – 2024-06-18 (×21): 200 mg via ORAL
  Filled 2024-06-11 (×21): qty 2

## 2024-06-11 MED ORDER — METFORMIN HCL 500 MG PO TABS
500.0000 mg | ORAL_TABLET | Freq: Every day | ORAL | Status: DC
  Administered 2024-06-12: 500 mg via ORAL
  Filled 2024-06-11: qty 1

## 2024-06-11 NOTE — Plan of Care (Signed)
" °  RD consulted for nutrition education regarding diabetes.   Lab Results  Component Value Date   HGBA1C 7.7 (H) 05/28/2024    RD provided Plate Method for Diabetes and Power of Protein in Diabetes handout from the Academy of Nutrition and Dietetics. Discussed different food groups and their effects on blood sugar, emphasizing carbohydrate-containing foods. Provided list of carbohydrates and recommended serving sizes of common foods. Encouraged intake of protein at all meals and snacks and discussed protein's impact on blood sugar management.  Discussed importance of controlled and consistent carbohydrate intake throughout the day. Provided examples of ways to balance meals/snacks and encouraged intake of high-fiber, whole grain complex carbohydrates. Encouraged pt to discuss medicinal management of diabetes with PCP. Teach back method used.  Pt reports good appetite prior to accident. Pt reports eating 2x per day (typically skips breakfast). Lunch would usually consist of some type of meat or just raw vegetables (tomatoes, cucumbers, peppers). Dinner consisted of meat (mostly baked chicken), vegetables, and a side (if it was a starchy side, pt would eat small portion). Pt reports only drinking water  and 100% pure blueberry juice throughout the day. Pt reports she previously cut out all beverages with added sugars and monitors most ingredients of the food she eats by looking at the food labels. Pt chooses vegetables and whole grains for fiber and usually avoids any high carbohydrate white starches. Pt showed RD results from her MyChart which showed previous A1c 01/2023 was 13.3% and her A1c has improved to 7.7%. Pt states this improvement has been from lifestyle changes and she has not been on any medication (states she had bad reaction to Metformin  previously). Pt states she was also active prior to accident and was not sedentary. Encouraged continued lifestyle habits.  Expect fair  compliance.  Body mass index is 39.14 kg/m. Pt meets criteria for obesity class II based on current BMI.  Current diet order is Carb Mod, patient is consuming approximately 78% of meals at this time. Labs and medications reviewed. No further nutrition interventions warranted at this time. RD contact information provided. If additional nutrition issues arise, please re-consult RD.     Caitlyn Glance, MS, RDN, LDN Clinical Dietitian I Please reach out via secure chat   "

## 2024-06-11 NOTE — Progress Notes (Signed)
 "                                                        PROGRESS NOTE   Subjective/Complaints: Pain medications helping with distal leg pain, but not controlling pain around her left hip region.  She has some muscle soreness in her trapezius/periscapular muscles bilaterally at night.  ROS: as per HPI. Denies fevers, CP, SOB, abd pain, N/V/D/C, or any other complaints at this time.   + Left hip pain  Objective:   VAS US  LOWER EXTREMITY VENOUS (DVT) Result Date: 06/11/2024  Lower Venous DVT Study Patient Name:  Caitlyn [name]  Date of Exam:   06/10/2024 Medical Rec #: [id number]       Accession #:    [id number] Date of Birth: Aug 28, 1980       Patient Gender: F Patient Age:   43 years Exam Location:  [facility] Procedure:      VAS US  LOWER EXTREMITY VENOUS (DVT) Referring Phys: TORIBIO PITCH --------------------------------------------------------------------------------  Indications: Swelling. Other Indications: Rehab patient. Limitations: Orthopaedic appliance and bandages. Comparison Study: No previous exams Performing Technologist: Jody Hill RVT, RDMS  Examination Guidelines: A complete evaluation includes B-mode imaging, spectral Doppler, color Doppler, and power Doppler as needed of all accessible portions of each vessel. Bilateral testing is considered an integral part of a complete examination. Limited examinations for reoccurring indications may be performed as noted. The reflux portion of the exam is performed with the patient in reverse Trendelenburg.  +---------+---------------+---------+-----------+----------+--------------+ RIGHT    CompressibilityPhasicitySpontaneityPropertiesThrombus Aging +---------+---------------+---------+-----------+----------+--------------+ CFV      Full           Yes      Yes                                 +---------+---------------+---------+-----------+----------+--------------+ SFJ      Full                                                         +---------+---------------+---------+-----------+----------+--------------+ FV Prox  Full           Yes      Yes                                 +---------+---------------+---------+-----------+----------+--------------+ FV Mid   Full           Yes      Yes                                 +---------+---------------+---------+-----------+----------+--------------+ FV DistalFull           Yes      Yes                                 +---------+---------------+---------+-----------+----------+--------------+ PFV      Full                                                        +---------+---------------+---------+-----------+----------+--------------+  POP      Full           Yes      Yes                                 +---------+---------------+---------+-----------+----------+--------------+ PTV      Full                                                        +---------+---------------+---------+-----------+----------+--------------+ PERO     Full                                                        +---------+---------------+---------+-----------+----------+--------------+   +---------+---------------+---------+-----------+----------+-------------------+ LEFT     CompressibilityPhasicitySpontaneityPropertiesThrombus Aging      +---------+---------------+---------+-----------+----------+-------------------+ CFV      Full           Yes      Yes                                      +---------+---------------+---------+-----------+----------+-------------------+ SFJ      Full                                                             +---------+---------------+---------+-----------+----------+-------------------+ FV Prox  Full           Yes      Yes                                      +---------+---------------+---------+-----------+----------+-------------------+ FV Mid   Full           Yes      Yes                                       +---------+---------------+---------+-----------+----------+-------------------+ FV DistalFull           Yes      Yes                                      +---------+---------------+---------+-----------+----------+-------------------+ PFV      Full                                                             +---------+---------------+---------+-----------+----------+-------------------+ POP      Full           Yes      Yes                                      +---------+---------------+---------+-----------+----------+-------------------+  PTV                                                   unable to visualize +---------+---------------+---------+-----------+----------+-------------------+ PERO                                                  unable to visualize +---------+---------------+---------+-----------+----------+-------------------+   Left Technical Findings: Not visualized segments include peroneal and posterior tibial veins, due to cast placement.   Summary: BILATERAL: -No evidence of popliteal cyst, bilaterally. RIGHT: - There is no evidence of deep vein thrombosis in the lower extremity.  LEFT: - There is no evidence of deep vein thrombosis in the lower extremity. However, portions of this examination were limited- see technologist comments above.  - Ultrasound characteristics of enlarged lymph nodes noted in the groin.  *See table(s) above for measurements and observations. Electronically signed by Fonda Rim on 06/11/2024 at 9:23:10 AM.    Final    Recent Labs    06/08/24 1917 06/10/24 0552  WBC 11.0* 8.6  HGB 8.6* 8.4*  HCT 26.6* 25.5*  PLT 500* 481*   Recent Labs    06/08/24 1917 06/10/24 0552  NA  --  137  K  --  4.2  CL  --  100  CO2  --  30  GLUCOSE  --  215*  BUN  --  11  CREATININE 0.64 0.55  CALCIUM   --  9.1      Intake/Output Summary (Last 24 hours) at 06/11/2024 1105 Last data filed at 06/11/2024 9177 Gross  per 24 hour  Intake 476 ml  Output --  Net 476 ml        Physical Exam: Vital Signs Blood pressure 131/80, pulse 85, temperature 98.5 F (36.9 C), temperature source Oral, resp. rate 17, height 5' 4 (1.626 m), weight 103.4 kg, last menstrual period 05/20/2024, SpO2 100%.  Constitutional:      Appearance: Normal appearance. She is obese.     Comments: Patient working with therapy in the gym HENT:     Head: Normocephalic.     Comments: Some abrasions    Right Ear: External ear normal.     Left Ear: External ear normal.     Nose: Nose normal. No congestion.     Mouth/Throat:     Mouth: Mucous membranes are moist.    Pharynx: Oropharynx is clear. No oropharyngeal exudate.  Eyes:     General:        Right eye: No discharge.        Left eye: No discharge.     Extraocular Movements: Extraocular movements intact.  Cardiovascular:     Rate and Rhythm: Normal rate and regular rhythm.     Heart sounds: Normal heart sounds. No murmur heard.    No gallop.  Pulmonary:     Effort: Pulmonary effort is normal. No respiratory distress.     Breath sounds: Normal breath sounds. No wheezing, rhonchi or rales.  Abdominal:     General: Bowel sounds are normal. There is no distension.     Palpations: Abdomen is soft.     Tenderness: There is no abdominal tenderness.  Skin:    General: Skin is warm  and dry. Psychiatric:     Comments: Bright mood- almost hypertalkative MSK: Mild TTP in periscapular muscles bilaterally  PRIOR EXAMS: Musculoskeletal:     Cervical back: Neck supple.     Comments: Ue's 5/5 throughout B/L RLE 5-/5 throughout- esp HF LLE- hard to test with NWB throughout, but at least 3/5- and EHL 4/5- can lift against gravity except NT on L ankle due to cast/ACE wrap      Neurological:     Comments: Patient is alert and oriented x 3 following commands and cooperative with exam Very mild numbness/tingling in L toes, but wiggles with no difficulty- are warm Ox3- following  commands   Prior neuro assessment is c/w today's exam 06/11/2024.    Assessment/Plan: 1. Functional deficits which require 3+ hours per day of interdisciplinary therapy in a comprehensive inpatient rehab setting. Physiatrist is providing close team supervision and 24 hour management of active medical problems listed below. Physiatrist and rehab team continue to assess barriers to discharge/monitor patient progress toward functional and medical goals  Care Tool:  Bathing    Body parts bathed by patient: Right arm, Left arm, Chest, Abdomen, Right upper leg, Left upper leg, Face   Body parts bathed by helper: Front perineal area, Buttocks, Right lower leg, Left lower leg     Bathing assist Assist Level: Maximal Assistance - Patient 24 - 49%     Upper Body Dressing/Undressing Upper body dressing   What is the patient wearing?: Hospital gown only    Upper body assist Assist Level: Minimal Assistance - Patient > 75%    Lower Body Dressing/Undressing Lower body dressing      What is the patient wearing?: Underwear/pull up, Pants     Lower body assist Assist for lower body dressing: Minimal Assistance - Patient > 75%     Toileting Toileting    Toileting assist Assist for toileting: Maximal Assistance - Patient 25 - 49%     Transfers Chair/bed transfer  Transfers assist  Chair/bed transfer activity did not occur: Safety/medical concerns  Chair/bed transfer assist level: Contact Guard/Touching assist     Locomotion Ambulation   Ambulation assist   Ambulation activity did not occur: Safety/medical concerns  Assist level: Minimal Assistance - Patient > 75% Assistive device: Walker-rolling Max distance: 55ft   Walk 10 feet activity   Assist  Walk 10 feet activity did not occur: Safety/medical concerns  Assist level: Minimal Assistance - Patient > 75% Assistive device: Walker-rolling   Walk 50 feet activity   Assist Walk 50 feet with 2 turns activity  did not occur: Safety/medical concerns         Walk 150 feet activity   Assist Walk 150 feet activity did not occur: Safety/medical concerns         Walk 10 feet on uneven surface  activity   Assist Walk 10 feet on uneven surfaces activity did not occur: Safety/medical concerns         Wheelchair     Assist Is the patient using a wheelchair?: Yes Type of Wheelchair: Manual    Wheelchair assist level: Supervision/Verbal cueing Max wheelchair distance: 156t    Wheelchair 50 feet with 2 turns activity    Assist        Assist Level: Supervision/Verbal cueing   Wheelchair 150 feet activity     Assist      Assist Level: Supervision/Verbal cueing   Blood pressure 131/80, pulse 85, temperature 98.5 F (36.9 C), temperature source Oral, resp. rate 17,  height 5' 4 (1.626 m), weight 103.4 kg, last menstrual period 05/20/2024, SpO2 100%.  Medical Problem List and Plan: 1. Functional deficits secondary to multitrauma after jumping from a third level building/suspect suicide attempt 05/27/2024             -patient may  shower- cover LLE             -ELOS/Goals: 10-12 days- supervision- NWB LLE  -Continue CIR, team conference tomorrow 2.  Antithrombotics: -DVT/anticoagulation:  Pharmaceutical: Lovenox  30mg  BID.  Check vascular study. Transition to Eliquis versus Xarelto at discharge x 30 days             -antiplatelet therapy: N/A 3. Pain Management: Neurontin  100 mg 3 times daily, Robaxin  as needed as well as oxycodone  5 to 10 mg every 4 hours as needed- will add Lidoderm  3 patches 12 hrs on;12 hrs off- pain controlled  -12/30 increase oxycodone  frequency to q3h, increase neurontin  to 200mg  TID, kpad to upper back 4. Mood/Behavior/Sleep: Cymbalta  20 mg daily, melatonin 5 mg nightly and (psychiatry follow-up)- suggest Neuropsych for pt without family in room.              -antipsychotic agents: N/A 5. Neuropsych/cognition: This patient is capable of making  decisions on her own behalf. 6. Skin/Wound Care: Routine skin checks 7. Fluids/Electrolytes/Nutrition: Routine in and outs with follow-up chemistries, cont vitamins/supplements.  8.  Left rib fractures 3/5-7.  Pain control conservative care 9.  Left open tib-fib fracture.  Status post ORIF left acetabulum, external fixator left ankle 12/16, closed reduction and washout with VAC placement 12/17.  Status post ORIF left tibia fibula and left malleolus fracture 12/19.  Nonweightbearing.  Follow-up Dr. Kendal 10.  Posterior left hip dislocation with associated acetabular fracture.  Status post ORIF 12/17. 11.  Acute blood loss anemia.   12/29 stable hgb 8.4, monitor 12.  Hypertension.  Amlodipine  5 mg daily.  Monitor with increased mobility  -12/28/-30 BPs stable, monitor.  Vitals:   06/08/24 1757 06/08/24 2051 06/09/24 0455 06/09/24 1445  BP: (!) 132/90 135/79 136/85 110/79   06/09/24 2049 06/10/24 0644 06/10/24 1310 06/10/24 2012  BP: 128/80 138/88 130/83 (!) 145/86   06/11/24 0516  BP: 131/80    13.  Constipation.  Colace 100 mg twice daily, MiraLAX  twice daily.  -12/30 LBM today 14.  Diabetes mellitus.  Hemoglobin A1c 7.7.  Currently on SSI. Needs additional meds long term. Consider Metformin ?  -12/30 start metformin  500mg  daily CBG (last 3)  Recent Labs    06/10/24 1622 06/10/24 2138 06/11/24 0612  GLUCAP 140* 266* 227*    15. Elevated LFTs  -12/29 decrease tylenol  dose, monitor      LOS: 3 days A FACE TO FACE EVALUATION WAS PERFORMED  Murray Collier 06/11/2024, 11:05 AM     "

## 2024-06-11 NOTE — IPOC Note (Signed)
 Overall Plan of Care [facility]) Patient Details Name: ARIANA JUUL MRN: [id number] DOB: Sep 21, 1980  Admitting Diagnosis: Multiple traumatic injuries  Hospital Problems: Principal Problem:   Multiple traumatic injuries Active Problems:   Critical polytrauma     Functional Problem List: Nursing Endurance, Motor, Pain, Edema  PT Balance, Endurance, Pain  OT Balance, Edema, Endurance, Pain  SLP    TR         Basic ADLs: OT Grooming, Bathing, Toileting, Dressing     Advanced  ADLs: OT Simple Meal Preparation     Transfers: PT Bed Mobility, Bed to Chair, Car  OT Toilet, Tub/Shower     Locomotion: PT Ambulation, Psychologist, [employer], Stairs     Additional Impairments: OT None  SLP        TR      Anticipated Outcomes Item Anticipated Outcome  Self Feeding no goal  Swallowing      Basic self-care  Supervision  Toileting  Supervision   Bathroom Transfers Supervision  Bowel/Bladder  manage bowel and baldder with mod I assist  Transfers  MI  Locomotion  MI w/c level  Communication     Cognition     Pain  Pain level at or below 5/10.  Safety/Judgment  Remain free of injury, prevent falls with cues and reminders.   Therapy Plan: PT Intensity: Minimum of 1-2 x/day ,45 to 90 minutes PT Frequency: Total of 15 hours over 7 days of combined therapies PT Duration Estimated Length of Stay: 10-12 days OT Intensity: Minimum of 1-2 x/day, 45 to 90 minutes OT Frequency: 5 out of 7 days OT Duration/Estimated Length of Stay: 10-12     Team Interventions: Nursing Interventions Patient/Family Education, Medication Management, Pain Management, Skin Care/Wound Management, Disease Management/Prevention, Discharge Planning  PT interventions Ambulation/gait training, Discharge planning, Functional mobility training, Psychosocial support, Therapeutic Activities, Balance/vestibular training, Neuromuscular re-education, Therapeutic Exercise, DME/adaptive equipment  instruction, UE/LE Strength taining/ROM, Patient/family education, Stair training  OT Interventions Balance/vestibular training, Disease mangement/prevention, Neuromuscular re-education, Self Care/advanced ADL retraining, Therapeutic Exercise, Wheelchair propulsion/positioning, Cognitive remediation/compensation, DME/adaptive equipment instruction, Pain management, Skin care/wound managment, UE/LE Strength taining/ROM, Community reintegration, Functional electrical stimulation, Patient/family education, Splinting/orthotics, UE/LE Coordination activities, Discharge planning, Functional mobility training, Psychosocial support, Therapeutic Activities, Visual/perceptual remediation/compensation  SLP Interventions    TR Interventions    SW/CM Interventions Discharge Planning, Psychosocial Support, Patient/Family Education, Disease Management/Prevention   Barriers to Discharge MD  Medical stability  Nursing      PT None    OT None    SLP      SW       Team Discharge Planning: Destination: PT-Home ,OT- Home , SLP-  Projected Follow-up: PT-Home health PT, Outpatient PT, 24 hour supervision/assistance, OT-  Home health OT, SLP-  Projected Equipment Needs: PT-Wheelchair (measurements), Rolling walker with 5 wheels, OT- To be determined, SLP-  Equipment Details: PT- , OT-  Patient/family involved in discharge planning: PT- Patient,  OT-Patient, SLP-   MD ELOS: 10-12 Medical Rehab Prognosis:  Excellent Assessment: The patient has been admitted for CIR therapies with the diagnosis of multitrauma after jumping from a third level building/suspect suicide attempt 05/27/2024 . The team will be addressing functional mobility, strength, stamina, balance, safety, adaptive techniques and equipment, self-care, bowel and bladder mgt, patient and caregiver education. Goals have been set at sup. Anticipated discharge destination is home.        See Team Conference Notes for weekly updates to the plan of  care

## 2024-06-11 NOTE — Progress Notes (Signed)
 Physical Therapy Session Note  Patient Details  Name: Caitlyn [name] MRN: [id number] Date of Birth: 09/21/1980  Today's Date: 06/11/2024 PT Individual Time: 0930-1025 and 1131-1156 PT Individual Time Calculation (min): 55 min and 25 min  Short Term Goals: Week 1:  PT Short Term Goal 1 (Week 1): Pt will be MI with supine to sit to supine transfer PT Short Term Goal 2 (Week 1): Pt will be MI with squat pivot transfer to/from wheelchair to bed/mat table PT Short Term Goal 3 (Week 1): Pt will be able to perform wheelchair mobility > 300 ft with MI without rest PT Short Term Goal 4 (Week 1): Pt will be able to stand x 2 min LLE NWB to assist with functional RLE strengthening  Skilled Therapeutic Interventions/Progress Updates:   Treatment Session 1 Received pt sitting in [facility] with husband at bedside. Pt agreeable to PT treatment and reported 10/10 pain in L hip/groin and tenderness on top of R foot and concerned about increase in edema - PA/MD notified and donned ted hose and provided pt with elevating legrest. Pt quite verbose and perseverating on pain in L hip constantly throughout session. Session with emphasis on functional mobility/transfers, generalized strengthening and endurance, dynamic standing balance/coordination, and gait training. Donned R shoe with supervision and performed WC mobility 164ft using BUE and supervision to main therapy gym with emphasis on UE strength - required rest break afterwards. Stood with RW and light min A and ambulated 63ft with RW and min A to mat. Pt demo good adherence to LLE NWB precautions with L knee flexed but with short steps on RLE and decreased foot clearance scuffing foot on floor. MD arrived for morning rounds and discussed pain regimen - pt also reporting increased soreness in bilateral upper body - MD ordered k-pad but discussed with pt it is likely due to increased UE use with RW. Performed stand<>pivot into WC with RW and min A and returned to room.  Concluded session with pt sitting in [facility] with all needs within reach.   Treatment Session 2 Received pt sitting in WC, pt agreeable to PT treatment, and reported pain 7/10 in L hip/groin radiating down leg. Session with emphasis on generalized strengthening and endurance and WC mobility. Pt performed WC mobility 116ft using BUE and supervision/mod I to dayroom with emphasis on UE strength. Pt performed seated RLE strengthening on Kinetron at 20 cm/sec for 1 minute, progressing to 10 cm/sec for 1 minute x 3 additional trials with emphasis on glute/quad strength with therapist providing manual counter resistance. Discussed pt's biggest barrier - 3 flights of steps to enter apartment. Pt thinks she will be able to hop up steps since she has 2 handrails. Discussed recommendation to get 1st floor apartment or find alternate place to stay due to risk of overuse injury from hopping up/down steps (pain in RLE at baseline), high pain levels, and increased risk of falling. Transported back to room and concluded session with pt sitting in [facility] with all needs within reach.   Therapy Documentation Precautions:  Precautions Precautions: Fall, Other (comment) (NWB LLE; OK for hip and knee ROM per chart review; prior pain history R LE (significant)) Recall of Precautions/Restrictions: Intact Precaution/Restrictions Comments: rib fxs, LLE NWB; L hip/pelvic fx Required Braces or Orthoses: Splint/Cast Splint/Cast: L lower leg Restrictions Weight Bearing Restrictions Per Provider Order: Yes LLE Weight Bearing Per Provider Order: Non weight bearing  Therapy/Group: Individual Therapy Therisa HERO Zaunegger Therisa Stains PT, DPT 06/11/2024, 6:53 AM

## 2024-06-11 NOTE — Progress Notes (Addendum)
 Occupational Therapy Session Note  Patient Details  Name: Caitlyn [name] MRN: [id number] Date of Birth: Jan 14, 1981  Today's Date: 06/11/2024 OT Individual Time: 0850-0930 & 1420-1530 OT Individual Time Calculation (min): 40 min & 70 min   Short Term Goals: Week 1:  OT Short Term Goal 1 (Week 1): Pt will perform toilet transfer with CGA consistently OT Short Term Goal 2 (Week 1): Pt will perform LB dress with MIN A using AE PRN OT Short Term Goal 3 (Week 1): Pt will perform clothing management for toileting with MIN A  Skilled Therapeutic Interventions/Progress Updates:  Session 1 Skilled OT intervention completed with focus on ADL retraining, AE education, functional transfers. Pt received upright in bed, agreeable to session. 7/10 pain reported in L hip; nurse notified. OT offered rest breaks, repositioning throughout for pain reduction.  Pt was verbose and required frequent redirection but cooperative and motivated. Pt needing to get LB dressed. Pt unable to access LLE due to weight of cast/pain in hip- suggested 1/2 circle sit but pt still unable. Pt transitioned with supervision and increased time to EOB, then education was offered on use of reacher for threading clothing over feet. Pt was able to return demo with supervision over both feet but mod/max cueing. Pt had velcro pants that husband bought to make things easier, but we discussed that they actually made things more difficult with donning over hips. Pt stood CGA using RW with both hands on bed, and LLE touching floor but no weight, then able to donn clothing over hips with CGA for balance and intermittent adherence to LLE NWB precautions. Pt demos fluctuating adherence to precautions and decreased safety awareness, as pt on multiple accounts would stand without RW, put LLE fully on floor and attempt to transfer without therapist readiness.  CGA stand pivot with RW > w/c however pt picking up RLE therefore implied WB on LLE. Discussed  need for toe to heel transition and/or hopping on RLE but needed control for joint integrity. Pt reports husband assists her to bathroom and was checked off by therapies- did clarify on board that pt and husband are to do squat pivots only for safety reasons. Pt remained seated in w/c, with husband present, and with all needs in reach at end of session.  Session 2 Skilled OT intervention completed with focus on functional transfers, dynamic standing balance, core strengthening. Pt received seated in w/c, agreeable to session. 8/10 pain reported in L thigh; pre-medicated. OT offered rest breaks, repositioning for pain reduction.  Pt declined showering. Self-propelled in w/c with supervision from room <> gym. Education provided on w/c leg rest management with pt requiring min A to apply and max cues. LLE required elevation sitting EOM during breaks due to shakiness and pt c/o thigh swelling- team made aware.  Pt participated in the following dynamic standing balance and endurance tasks to promote independence and safety during BADLs and functional mobility: -placing and retrieving cones from top of long mirror. 1 CGA sit > stand with RW per clip (12 total) then again to remove. CGA needed for dynamic standing balance during task and improved NWB adherence on LLE  Seated EOM, pt completed the following core exercises to promote stability and balance needed for BADLs: (4.5 lb ball), x20 each -modified crunches -modified russian twists -modified decline chest press  Pt remained seated in w/c, with chair alarm on/activated, and with all needs in reach at end of session.   Therapy Documentation Precautions:  Precautions Precautions: Fall, Other (  comment) (NWB LLE; OK for hip and knee ROM per chart review; prior pain history R LE (significant)) Recall of Precautions/Restrictions: Intact Precaution/Restrictions Comments: rib fxs, LLE NWB; L hip/pelvic fx Required Braces or Orthoses:  Splint/Cast Splint/Cast: L lower leg Restrictions Weight Bearing Restrictions Per Provider Order: Yes LLE Weight Bearing Per Provider Order: Non weight bearing    Therapy/Group: Individual Therapy  [name], MS, OTR/L  06/11/2024, 3:35 PM

## 2024-06-12 ENCOUNTER — Telehealth (HOSPITAL_COMMUNITY): Payer: Self-pay

## 2024-06-12 ENCOUNTER — Other Ambulatory Visit (HOSPITAL_COMMUNITY): Payer: Self-pay

## 2024-06-12 DIAGNOSIS — F4311 Post-traumatic stress disorder, acute: Secondary | ICD-10-CM

## 2024-06-12 LAB — GLUCOSE, CAPILLARY
Glucose-Capillary: 213 mg/dL — ABNORMAL HIGH (ref 70–99)
Glucose-Capillary: 154 mg/dL — ABNORMAL HIGH (ref 70–99)
Glucose-Capillary: 206 mg/dL — ABNORMAL HIGH (ref 70–99)
Glucose-Capillary: 205 mg/dL — ABNORMAL HIGH (ref 70–99)

## 2024-06-12 MED ORDER — DOCUSATE SODIUM 100 MG PO CAPS: 100.0000 mg | ORAL_CAPSULE | Freq: Two times a day (BID) | ORAL | Status: AC

## 2024-06-12 MED ORDER — GLIPIZIDE 5 MG PO TABS
5.0000 mg | ORAL_TABLET | Freq: Every day | ORAL | Status: DC
  Administered 2024-06-13 – 2024-06-18 (×6): 5 mg via ORAL
  Filled 2024-06-12 (×6): qty 1

## 2024-06-12 MED ORDER — POLYETHYLENE GLYCOL 3350 17 G PO PACK: 17.0000 g | PACK | Freq: Every day | ORAL | Status: AC

## 2024-06-12 MED ORDER — ACETAMINOPHEN 500 MG PO TABS: 500.0000 mg | ORAL_TABLET | Freq: Four times a day (QID) | ORAL | Status: AC

## 2024-06-12 NOTE — Patient Care Conference (Signed)
 Inpatient RehabilitationTeam Conference and Plan of Care Update Date: 06/12/2024   Time: 1:50 PM    Patient Name: Caitlyn Caitlyn [name]      Medical Record Number: [id number]  Date of Birth: 08-Oct-1980 Sex: Female         Room/Bed: 4W09C/4W09C-01 Payor Info: Payor: [employer]UL[employer] / Plan: [employer]  / Product Type: *No Product type* /    Admit Date/Time:  06/08/2024  6:05 PM  Primary Diagnosis:  Multiple traumatic injuries  Hospital Problems: Principal Problem:   Multiple traumatic injuries Active Problems:   Critical polytrauma    Expected Discharge Date: Expected Discharge Date: 06/18/24  Team Members Present: Physician leading conference: Dr. Murray Collier Social Worker Present: Waverly Gentry, LCSW-A Nurse Present: Barnie Ronde, RN PT Present: Sherlean Perks, PT OT Present: Lorrayne Fritter, OT     Current Status/Progress Goal Weekly Team Focus  Bowel/Bladder   Continent Caitlyn [name]/Caitlyn [name]; LBM: 12/30   Remain Continent; m/t adequate output WNL   Assist w/ toileting needs q shift & PRN    Swallow/Nutrition/ Hydration               ADL's   Set up A UB, Min A LB, Mod A toileting   Supervision   Adherence to NWB LLE, dynamic standing balance, LB dressing, AE    Mobility   bed mobility supervision, transfers with/without AD CGA, gait 73ft with RW min A   supervision/CGA, min A steps, setup assist WC mobility  barriers: adherance to LLE NWB precautions, fear, generalized weakness/deconditioning, steps    Communication                Safety/Cognition/ Behavioral Observations               Pain   Pain managed w/ current regimen   Remain free from pain; < 3/10 per Pain Scale   Assist w/ Pain Mgmt needs q shift & PRN    Skin   Mepilex/Left Leg Cast Site   Maintain integrity  Assess for breakdown & provide education RE: prevention      Discharge Planning:  Patient plans to return home with spouse and children. Will not be able to work due to limitations. Neuropsych  to see due to concerns. Will await therapy follow-up recommendations.   Team Discussion: Patient admitted with mult trauma post fall with hip pain, anemia,; NWB left LE with cast/splint in place.  Patient on target to meet rehab goals: yes, currently needs set up for upper body care and min assist for lower body care and toileting. Needs CGA for transfers and able to ambulate up to 10' using RW with min assist.  Goals for discharge set for supervision overall.  *See Care Plan and progress notes for long and short-term goals.   Revisions to Treatment Plan:  Stair training Neuro psych consult   Teaching Needs: Safety, medications, dietary modification, DM management, weight bearing precautions, transfers, toileting, etc.   Current Barriers to Discharge: Decreased caregiver support, Home enviroment access/layout, and Weight bearing restrictions  Possible Resolutions to Barriers: Family education OP follow up services DME: TTB, BSC and wheelchair     Medical Summary Current Status: trauma, HTN, DM2, LFts elevated, pain  Barriers to Discharge: Self-care education;Morbid Obesity;Medical stability;Uncontrolled Pain;Weight bearing restrictions  Barriers to Discharge Comments: trauma, HTN, DM2, LFts elevated, pain, stairs Possible Resolutions to [employer] Focus: pain medications adjusted, adjust DM medications, monitor BP   Continued Need for Acute Rehabilitation Level of Care: The patient requires daily medical  management by a physician with specialized training in physical medicine and rehabilitation for the following reasons: Direction of a multidisciplinary physical rehabilitation program to maximize functional independence : Yes Medical management of patient stability for increased activity during participation in an intensive rehabilitation regime.: Yes Analysis of laboratory values and/or radiology reports with any subsequent need for medication adjustment and/or medical  intervention. : Yes   I attest that I was present, lead the team conference, and concur with the assessment and plan of the team.   Caitlyn Caitlyn [name] 06/12/2024, 1:50 PM

## 2024-06-12 NOTE — Telephone Encounter (Signed)
 Pharmacy Patient Advocate Encounter  Insurance verification completed.    The patient is insured through [employer]. Patient has Medicare and is not eligible for a copay card, but may be able to apply for patient assistance or Medicare RX Payment Plan (Patient Must reach out to their plan, if eligible for payment plan), if available.    Ran test claim for Eliquis 5mg  tablet and the current 30 day co-pay is $0.  Ran test claim for Xarelto 20mg  tablet and the current 30 day co-pay is $0.  This test claim was processed through [employer]- copay amounts may vary at other pharmacies due to [employer], or as the patient moves through the different stages of their insurance plan.

## 2024-06-12 NOTE — Progress Notes (Addendum)
 "                                                        PROGRESS NOTE   Subjective/Complaints: Reports pain is better controlled today.  Having some continued rib soreness with movement.  Getting ready to take a shower with therapy.  No additional concerns or complaints.  ROS: as per HPI. Denies fevers, CP, SOB, abd pain, N/V/D/C, or any other complaints at this time.   + Left hip pain-improved today + Periscapular muscle soreness  Objective:   VAS US  LOWER EXTREMITY VENOUS (DVT) Result Date: 06/11/2024  Lower Venous DVT Study Patient Name:  Caitlyn [name]  Date of Exam:   06/10/2024 Medical Rec #: [id number]       Accession #:    [id number] Date of Birth: [birth year]       Patient Gender: F Patient Age:   43 years Exam Location:  [facility] Procedure:      VAS US  LOWER EXTREMITY VENOUS (DVT) Referring Phys: TORIBIO PITCH --------------------------------------------------------------------------------  Indications: Swelling. Other Indications: Rehab patient. Limitations: Orthopaedic appliance and bandages. Comparison Study: No previous exams Performing Technologist: Jody Hill RVT, RDMS  Examination Guidelines: A complete evaluation includes B-mode imaging, spectral Doppler, color Doppler, and power Doppler as needed of all accessible portions of each vessel. Bilateral testing is considered an integral part of a complete examination. Limited examinations for reoccurring indications may be performed as noted. The reflux portion of the exam is performed with the patient in reverse Trendelenburg.  +---------+---------------+---------+-----------+----------+--------------+ RIGHT    CompressibilityPhasicitySpontaneityPropertiesThrombus Aging +---------+---------------+---------+-----------+----------+--------------+ CFV      Full           Yes      Yes                                 +---------+---------------+---------+-----------+----------+--------------+ SFJ      Full                                                         +---------+---------------+---------+-----------+----------+--------------+ FV Prox  Full           Yes      Yes                                 +---------+---------------+---------+-----------+----------+--------------+ FV Mid   Full           Yes      Yes                                 +---------+---------------+---------+-----------+----------+--------------+ FV DistalFull           Yes      Yes                                 +---------+---------------+---------+-----------+----------+--------------+ PFV      Full                                                        +---------+---------------+---------+-----------+----------+--------------+  POP      Full           Yes      Yes                                 +---------+---------------+---------+-----------+----------+--------------+ PTV      Full                                                        +---------+---------------+---------+-----------+----------+--------------+ PERO     Full                                                        +---------+---------------+---------+-----------+----------+--------------+   +---------+---------------+---------+-----------+----------+-------------------+ LEFT     CompressibilityPhasicitySpontaneityPropertiesThrombus Aging      +---------+---------------+---------+-----------+----------+-------------------+ CFV      Full           Yes      Yes                                      +---------+---------------+---------+-----------+----------+-------------------+ SFJ      Full                                                             +---------+---------------+---------+-----------+----------+-------------------+ FV Prox  Full           Yes      Yes                                      +---------+---------------+---------+-----------+----------+-------------------+ FV Mid   Full           Yes       Yes                                      +---------+---------------+---------+-----------+----------+-------------------+ FV DistalFull           Yes      Yes                                      +---------+---------------+---------+-----------+----------+-------------------+ PFV      Full                                                             +---------+---------------+---------+-----------+----------+-------------------+ POP      Full           Yes      Yes                                      +---------+---------------+---------+-----------+----------+-------------------+  PTV                                                   unable to visualize +---------+---------------+---------+-----------+----------+-------------------+ PERO                                                  unable to visualize +---------+---------------+---------+-----------+----------+-------------------+   Left Technical Findings: Not visualized segments include peroneal and posterior tibial veins, due to cast placement.   Summary: BILATERAL: -No evidence of popliteal cyst, bilaterally. RIGHT: - There is no evidence of deep vein thrombosis in the lower extremity.  LEFT: - There is no evidence of deep vein thrombosis in the lower extremity. However, portions of this examination were limited- see technologist comments above.  - Ultrasound characteristics of enlarged lymph nodes noted in the groin.  *See table(s) above for measurements and observations. Electronically signed by Fonda Rim on 06/11/2024 at 9:23:10 AM.    Final    Recent Labs    06/10/24 0552  WBC 8.6  HGB 8.4*  HCT 25.5*  PLT 481*   Recent Labs    06/10/24 0552  NA 137  K 4.2  CL 100  CO2 30  GLUCOSE 215*  BUN 11  CREATININE 0.55  CALCIUM  9.1      Intake/Output Summary (Last 24 hours) at 06/12/2024 1146 Last data filed at 06/11/2024 1315 Gross per 24 hour  Intake 240 ml  Output --  Net 240 ml         Physical Exam: Vital Signs Blood pressure 131/89, pulse 92, temperature 98.4 F (36.9 C), temperature source Oral, resp. rate 18, height 5' 4 (1.626 m), weight 103.4 kg, last menstrual period 05/20/2024, SpO2 100%.  Constitutional:      Appearance: Normal appearance. She is obese.     Comments: Patient working with therapy in her room HENT:     Head: Normocephalic.     Comments: Some abrasions    Right Ear: External ear normal.     Left Ear: External ear normal.     Nose: Nose normal. No congestion.     Mouth/Throat:     Mouth: Mucous membranes are moist.    Pharynx: Oropharynx is clear. No oropharyngeal exudate.  Eyes:     General:        Right eye: No discharge.        Left eye: No discharge.     Extraocular Movements: Extraocular movements intact.  Cardiovascular:     Rate and Rhythm: Normal rate and regular rhythm.     Heart sounds: Normal heart sounds. No murmur heard.    No gallop.  Pulmonary:     Effort: Pulmonary effort is normal. No respiratory distress.     Breath sounds: Normal breath sounds. No wheezing, rhonchi or rales.  Abdominal:     General: Bowel sounds are normal. There is no distension.     Palpations: Abdomen is soft.     Tenderness: There is no abdominal tenderness.  Skin:    General: Skin is warm and dry. Psychiatric:     Comments: appropriate, cooperative MSK: Mild TTP in periscapular muscles bilaterally  PRIOR EXAMS: Musculoskeletal:     Cervical back: Neck supple.  Comments: Ue's 5/5 throughout B/L RLE 5-/5 throughout- esp HF LLE- hard to test with NWB throughout, but at least 3/5- and EHL 4/5- can lift against gravity except NT on L ankle due to cast/ACE wrap      Neurological:     Comments: Patient is alert and oriented x 3 following commands and cooperative with exam Very mild numbness/tingling in L toes, but wiggles with no difficulty- are warm Ox3- following commands   Prior neuro assessment is c/w today's exam  06/12/2024.    Assessment/Plan: 1. Functional deficits which require 3+ hours per day of interdisciplinary therapy in a comprehensive inpatient rehab setting. Physiatrist is providing close team supervision and 24 hour management of active medical problems listed below. Physiatrist and rehab team continue to assess barriers to discharge/monitor patient progress toward functional and medical goals  Care Tool:  Bathing    Body parts bathed by patient: Right arm, Left arm, Chest, Abdomen, Right upper leg, Left upper leg, Face   Body parts bathed by helper: Front perineal area, Buttocks, Right lower leg, Left lower leg     Bathing assist Assist Level: Maximal Assistance - Patient 24 - 49%     Upper Body Dressing/Undressing Upper body dressing   What is the patient wearing?: Hospital gown only    Upper body assist Assist Level: Minimal Assistance - Patient > 75%    Lower Body Dressing/Undressing Lower body dressing      What is the patient wearing?: Underwear/pull up, Pants     Lower body assist Assist for lower body dressing: Minimal Assistance - Patient > 75%     Toileting Toileting    Toileting assist Assist for toileting: Maximal Assistance - Patient 25 - 49%     Transfers Chair/bed transfer  Transfers assist  Chair/bed transfer activity did not occur: Safety/medical concerns  Chair/bed transfer assist level: Contact Guard/Touching assist     Locomotion Ambulation   Ambulation assist   Ambulation activity did not occur: Safety/medical concerns  Assist level: Minimal Assistance - Patient > 75% Assistive device: Walker-rolling Max distance: 69ft   Walk 10 feet activity   Assist  Walk 10 feet activity did not occur: Safety/medical concerns  Assist level: Minimal Assistance - Patient > 75% Assistive device: Walker-rolling   Walk 50 feet activity   Assist Walk 50 feet with 2 turns activity did not occur: Safety/medical concerns         Walk  150 feet activity   Assist Walk 150 feet activity did not occur: Safety/medical concerns         Walk 10 feet on uneven surface  activity   Assist Walk 10 feet on uneven surfaces activity did not occur: Safety/medical concerns         Wheelchair     Assist Is the patient using a wheelchair?: Yes Type of Wheelchair: Manual    Wheelchair assist level: Supervision/Verbal cueing Max wheelchair distance: 156t    Wheelchair 50 feet with 2 turns activity    Assist        Assist Level: Supervision/Verbal cueing   Wheelchair 150 feet activity     Assist      Assist Level: Supervision/Verbal cueing   Blood pressure 131/89, pulse 92, temperature 98.4 F (36.9 C), temperature source Oral, resp. rate 18, height 5' 4 (1.626 m), weight 103.4 kg, last menstrual period 05/20/2024, SpO2 100%.  Medical Problem List and Plan: 1. Functional deficits secondary to multitrauma after jumping from a third level building/suspect suicide attempt  05/27/2024             -patient may  shower- cover LLE             -ELOS/Goals: 10-12 days- supervision- NWB LLE  -Continue CIR, team conference tomorrow  -Stairs at home is a limitation  -Team conference today please see physician documentation under team conference tab, met with team  to discuss problems,progress, and goals. Formulized individual treatment plan based on medical history, underlying problem and comorbidities.   2.  Antithrombotics: -DVT/anticoagulation:  Pharmaceutical: Lovenox  30mg  BID.  Check vascular study. Transition to Eliquis versus Xarelto at discharge x 30 days             -antiplatelet therapy: N/A 3. Pain Management: Neurontin  100 mg 3 times daily, Robaxin  as needed as well as oxycodone  5 to 10 mg every 4 hours as needed- will add Lidoderm  3 patches 12 hrs on;12 hrs off- pain controlled  -12/30 increase oxycodone  frequency to q3h, increase neurontin  to 200mg  TID, kpad to upper back  -12/31 pain improved  continue current regimen 4. Mood/Behavior/Sleep: Cymbalta  20 mg daily, melatonin 5 mg nightly and (psychiatry follow-up)- suggest Neuropsych for pt without family in room.              -antipsychotic agents: N/A 5. Neuropsych/cognition: This patient is capable of making decisions on her own behalf. 6. Skin/Wound Care: Routine skin checks 7. Fluids/Electrolytes/Nutrition: Routine in and outs with follow-up chemistries, cont vitamins/supplements.  8.  Left rib fractures 3/5-7.  Pain control conservative care 9.  Left open tib-fib fracture.  Status post ORIF left acetabulum, external fixator left ankle 12/16, closed reduction and washout with VAC placement 12/17.  Status post ORIF left tibia fibula and left malleolus fracture 12/19.  Nonweightbearing.  Follow-up Dr. Kendal 10.  Posterior left hip dislocation with associated acetabular fracture.  Status post ORIF 12/17. 11.  Acute blood loss anemia.   12/29 stable hgb 8.4, monitor  Recheck tomorrow 12.  Hypertension.  Amlodipine  5 mg daily.  Monitor with increased mobility  -12/28/-31 BPs stable, monitor.  Vitals:   06/08/24 1757 06/08/24 2051 06/09/24 0455 06/09/24 1445  BP: (!) 132/90 135/79 136/85 110/79   06/09/24 2049 06/10/24 0644 06/10/24 1310 06/10/24 2012  BP: 128/80 138/88 130/83 (!) 145/86   06/11/24 0516 06/11/24 1523 06/11/24 2040 06/12/24 0524  BP: 131/80 (!) 152/87 136/73 131/89    13.  Constipation.  Colace 100 mg twice daily, MiraLAX  twice daily.  -12/31 LBM yesterday monitor 14.  Diabetes mellitus.  Hemoglobin A1c 7.7.  Currently on SSI. Needs additional meds long term. Consider Metformin ?  -12/30 start metformin  500mg  daily  -12/31 pt reports prior GI issues after metformin , added to allergy list, although she had his AM and reports feels OK in afternoon. Will change to glipizide 5mg  starting tomorrow CBG (last 3)  Recent Labs    06/11/24 2105 06/12/24 0556 06/12/24 1128  GLUCAP 239* 213* 154*    15. Elevated  LFTs  -12/29 decrease tylenol  dose, monitor   Recheck tomorrow 16. Obesity class 2. Dietary education  Body mass index is 39.14 kg/m.    LOS: 4 days A FACE TO FACE EVALUATION WAS PERFORMED  Caitlyn [name] 06/12/2024, 11:46 AM     "

## 2024-06-12 NOTE — Plan of Care (Signed)
   Problem: Activity: Goal: Ability to ambulate and perform ADLs will improve Outcome: Progressing   Problem: Clinical Measurements: Goal: Postoperative complications will be avoided or minimized Outcome: Progressing   Problem: Pain Management: Goal: Pain level will decrease Outcome: Progressing

## 2024-06-12 NOTE — Progress Notes (Signed)
"   Occupational Therapy Session Note  Patient Details  Name: Caitlyn [name] MRN: [id number] Date of Birth: [birth year]  Today's Date: 06/12/2024 OT Individual Time: 1000-1126 OT Individual Time Calculation (min): 86 min    Short Term Goals: Week 1:  OT Short Term Goal 1 (Week 1): Pt will perform toilet transfer with CGA consistently OT Short Term Goal 2 (Week 1): Pt will perform LB dress with MIN A using AE PRN OT Short Term Goal 3 (Week 1): Pt will perform clothing management for toileting with MIN A  Skilled Therapeutic Interventions/Progress Updates:  Skilled OT intervention completed with focus on ADL retraining, functional endurance, and mobility within a shower context. Pt received seated in w/c, agreeable to session. Intermittent unrated pain reported in L groin, L flank; pre-medicated. OT offered rest breaks, repositioning and moist heat via shower for pain relief.  Pt completed all sit > stands and stand pivot transfers with CGA/supervision while using RW/grab bar. Min cues needed for safe use of DME, fall prevention and body positioning.  Transported dependently in w/c > shower. Completed stand pivot to bench using grab bar for balance.  Waterproof cover applied to LLE prior to shower with education offered on double bag method to husband. Husband assisted with ADLs with guidance from OT. Pt was able to bathe all parts with set up A using LH sponge for LB and periareas all at seated level. Stand pivot > w/c. Able to donn shirt/deo with set up A. Threaded LB clothing with use of reacher with supervision at the sit > stand level with RW. Donned socks/shoes and TEDS with set up A excluding casted LLE.  Husband cleared to assist pt with shower in future due to adequate safety awareness and DC prep. Education provided on recommendation of TTB for pt's tub/shower at home to increase safety. Discussed shower curtain tuck method and minimizing stands for fall prevention. Discussed pt's  reliance on arm rails to push to stand, therefore opted for [facility] over toilet to promote independence. Do not feel that follow up OT at DC is warranted at this time and pt and husband in agreement.  Pt remained seated in w/c, with all needs in reach at end of session.   Therapy Documentation Precautions:  Precautions Precautions: Fall, Other (comment) (NWB LLE; OK for hip and knee ROM per chart review; prior pain history R LE (significant)) Recall of Precautions/Restrictions: Intact Precaution/Restrictions Comments: rib fxs, LLE NWB; L hip/pelvic fx Required Braces or Orthoses: Splint/Cast Splint/Cast: L lower leg Restrictions Weight Bearing Restrictions Per Provider Order: Yes LLE Weight Bearing Per Provider Order: Non weight bearing    Therapy/Group: Individual Therapy  [name], MS, OTR/L  06/12/2024, 12:14 PM "

## 2024-06-12 NOTE — Progress Notes (Signed)
 Physical Therapy Session Note  Patient Details  Name: Caitlyn [name] MRN: [id number] Date of Birth: 1981-03-09  Today's Date: 06/12/2024 PT Individual Time: 0813-0908 and [id number] PT Individual Time Calculation (min): 55 min and 55 min  Short Term Goals: Week 1:  PT Short Term Goal 1 (Week 1): Pt will be MI with supine to sit to supine transfer PT Short Term Goal 2 (Week 1): Pt will be MI with squat pivot transfer to/from wheelchair to bed/mat table PT Short Term Goal 3 (Week 1): Pt will be able to perform wheelchair mobility > 300 ft with MI without rest PT Short Term Goal 4 (Week 1): Pt will be able to stand x 2 min LLE NWB to assist with functional RLE strengthening  Skilled Therapeutic Interventions/Progress Updates:   Treatment Session 1 Received pt semi-reclined in bed with husband at bedside. Pt remains verbose but agreeable to PT treatment and reported pain 5/10 in L anterior groin (premedicated). Session with emphasis on functional mobility/transfers, generalized strengthening and endurance, dynamic standing balance/coordination, and stair navigation. Pt transferred semi-reclined<>sitting L EOB with HOB elevated and supervision/mod I. Noted ted hose still on - educated pt on importance of removing them at night. Donned pants sitting EOB using reacher with supervision and donned R shoe without assist. Pt performed all transfers with RW and CGA throughout session. Pt able to pull pants over hips in standing with CGA for balance but demo TDWB on LLE rather than NWB.  Pt transported to/from room in [facility] dependently for time management purposes. Discussed f/u therapy - recommended allowing therapist to provide HEP and then follow up in OPPT once WB restrictions have been lifted. Pt has 3 flights of steps to enter apartment with bilateral handrails. Discussed, educated, and demonstrated 3 options for stair navigation: Option 1: hopping up on RLE forwards - pt attempted but unable to clear R  foot due to weakness, decreased balance, and fear Option 2: hopping up on RLE backwards - still unable to clear R foot and fearful Option 3: bumping up/down on buttocks - transitioned standing with RW<>sitting on 3rd step with CGA and bumped up/down 3 steps on buttocks. Then required CGA to transition from to/from chair at top of landing using BUE and RLE. Then stood from 3rd step with BUE support onto RW with CGA.   Pt required ++ time to complete stair navigation and mod cues for technique (needs continued practice). Husband plans to take pictures of staircase and count steps. Showed pt staircase in stairwell and plan to practice in upcoming PT sessions. Concluded session with pt sitting in [facility] with all needs within reach.   Treatment Session 2 Received pt sitting in [facility] with MD and RN present to administer medications. Pt agreeable to PT treatment and did not state pain level during session. Session with emphasis on functional mobility/transfers, generalized strengthening and endurance, dynamic standing balance/coordination, and gait training.  Pt performed WC mobility 175ft using BUE and supervision/mod I to dayroom with emphasis on UE strength/coordination. Pt performed all transfers with RW and CGA throughout session with good adherance to LLE NWB precautions. Pt ambulated 47ft with RW and CGA to Nustep - cues to decrease cadence and increase R foot clearance by boosting up with BUE. Pt performed seated BUE/RLE strengthening on Nustep at workload 6 decreasing to 5 on Pace Partner for 8 minutes with emphasis on cardiovascular endurance. Pt required 2 rest breaks due to fatigue. Total of 670 steps, 0.4 miles, 72 SPM, and 2.6 METs.  Pt then ambulated additional 1ft with RW and CGA - limited by pain and tightness in L hip. Transitioned into supine and performed passive L hip flexor stretch 3x1 minute intervals gradually increasing ROM. Pt with increased low back pain when trying to sit up - cues  provided for logroll technique and pt scooted to EOM then transitioned into short sitting with supervision. Returned to [facility] and returned to room and concluded session with pt sitting in [facility] with all needs within reach.   Therapy Documentation Precautions:  Precautions Precautions: Fall, Other (comment) (NWB LLE; OK for hip and knee ROM per chart review; prior pain history R LE (significant)) Recall of Precautions/Restrictions: Intact Precaution/Restrictions Comments: rib fxs, LLE NWB; L hip/pelvic fx Required Braces or Orthoses: Splint/Cast Splint/Cast: L lower leg Restrictions Weight Bearing Restrictions Per Provider Order: Yes LLE Weight Bearing Per Provider Order: Non weight bearing  Therapy/Group: Individual Therapy Therisa HERO Zaunegger Therisa Stains PT, DPT 06/12/2024, 6:29 AM

## 2024-06-12 NOTE — Plan of Care (Signed)
  Problem: Consults Goal: RH GENERAL PATIENT EDUCATION Description: See Patient Education module for education specifics. Outcome: Progressing   Problem: RH SAFETY Goal: RH STG ADHERE TO SAFETY PRECAUTIONS W/ASSISTANCE/DEVICE Description: STG Adhere to Safety Precautions With mod I Assistance/Device. Outcome: Progressing   

## 2024-06-12 NOTE — Consult Note (Signed)
 Neuropsychological Consultation Comprehensive Inpatient Rehab   Patient:   Caitlyn [name]   DOB:   Oct 25, 1980  MR Number:  [id number]  Location:  [facility] [address] [place] [address] Dept: [phone] Loc: [phone]           Date of Service:   06/12/2024  Start Time:   1 PM End Time:   2 PM  Provider/Observer:  Norleen Asa, Psy.D.       Clinical Neuropsychologist       Billing Code/Service: [phone]  Reason for Service:    Caitlyn [name] is a 43 year old female referred for neuropsychological consultation while currently admitted to the comprehensive inpatient rehabilitation unit with polytrauma from falling out of a third story window on 05/27/2024.  The initial report from EMS suggested a possible suicide attempt, leading to a psychiatry consultation. This assessment is to evaluate cognitive status, particularly memory surrounding the event, emotional state, and to aid in overall recovery and disposition planning.    Presenting Concerns:  Reports initial amnesia for the fall, with memories returning in fragments over several days. Describes remembering becoming locked in her son's room and attempting to climb out the window to get help, believing she could safely reach a lower ledge. Denies any suicidal ideation at the time of the event or at any point in her life. Expresses frustration with hospital limitations, particularly regarding toileting (use of bedpan, catheter) and mobility restrictions.    Relevant Clinical History:  - Neurological: Fall from a third-story window on 05/27/2024 resulting in multiple orthopedic injuries. Cranial CT scan was negative. No history of brain injury, stroke, epilepsy, or neurodegenerative disease noted.  - Psychiatric: No formal psychiatric history prior to this admission. Psychiatry consulted due to suspected suicide attempt. She denies suicidal intent.  Notes indicate a differential of alcohol abuse vs. substance-induced mood disorder. She reports some low mood, hopelessness, and sleep issues related to chronic pain prior to the incident. Started on Cymbalta  and gabapentin  during this admission, which she is tolerating well with reported mood improvement. Contracts for safety and does not meet criteria for inpatient psychiatric hospitalization.   - Medical: Multiple orthopedic injuries including left open tib-fib fracture, posterior left hip dislocation with displaced acetabular fracture, and left 3rd, 5th-7th rib fractures. Status post multiple surgeries (ORIF left acetabular 05/29/2024, ORIF left tibia fibula and malleolus 05/31/2024, external fixator left ankle 05/28/2024). Non-weightbearing on left lower extremity. History of diet-controlled diabetes mellitus (HbA1c 7.7), hypertension (on amlodipine ), hyperlipidemia, obstructive sleep apnea (not on CPAP), and GERD. Hospital course complicated by acute blood loss anemia requiring transfusion. On Lovenox  for DVT prophylaxis.  - Behavioural: Admitted with a blood alcohol level of 186. Husband has expressed concerns about her drinking habits. No other forensic or behavioral issues noted.    Functional Capacity:  Currently admitted to a comprehensive rehab program due to decreased functional mobility. Requires assistance for all mobility and ADLs. Will be discharged with a wheelchair, walker, and toilet assistance device. Husband has been actively involved in care during hospitalization.    Mental State Examination:  - Appearance and Behaviour: Appeared calm and cooperative. Seated in wheelchair with leg splint in place. Engaged appropriately throughout the interview.  - Speech: Rate, volume, and prosody were within normal limits. Spontaneous and coherent. No word-finding difficulties noted.  - Mood and Affect: Reported mood is good and she is looking forward to discharge. Affect was appropriate to the  content  of the discussion, bright, and reactive.  - Thought Process/Content: Thought process was logical and goal-directed. Denies suicidal or homicidal ideation. No evidence of psychosis. No intrusive thoughts, flashbacks, or nightmares related to the trauma.  - Cognition: Oriented to person, place, time, and situation. Attention appeared intact. Demonstrates recall for recent hospital events and fragmented but detailed recall of the events leading to her fall.  - Insight and Judgement: Demonstrates some insight into her physical limitations and the need for assistance post-discharge. Insight into the circumstances of the fall appears focused on the accidental nature of the event (getting locked in a room). Judgement regarding her physical capabilities at the time of the fall was impaired, as she believed she could safely climb out the window.  Patient strongly denies any suicidal ideation and denies feeling suicidal at the time of her fall.    Clinical Impressions:  The patient is a 43 year old female recovering from significant orthopedic trauma. She engaged well in the clinical interview. She presents as psychologically resilient and is coping well with a lengthy hospitalization. She consistently and strongly denies any suicidal intent related to her fall, and the psychiatric team concurs that she is not in any intermittent danger for self-harm. Her memory of the event, while initially absent, has partially returned and seems plausible. Her primary focus is on physical recovery and returning home. There are no current signs of a major mood or anxiety disorder requiring acute neuropsychological intervention.    Recommendations and Next Steps:  - Provided psychoeducation regarding memory and trauma, advising her to not dwell on trying to reconstruct a complete memory of the event, as this could lead to constructed memories.  - Reinforced adherence to orthopedic and therapy recommendations, particularly  regarding weight-bearing restrictions, to avoid a type I error and ensure maximal long-term recovery.  - Discussed expectations for the discharge process.  - Continue with current medical, psychiatric, and rehabilitation plan.  - Follow-up as needed.          Electronically Signed   _______________________ Norleen Asa, Psy.D. Clinical Neuropsychologist

## 2024-06-12 NOTE — Progress Notes (Signed)
 Physical Therapy Note  Patient Details  Name: Caitlyn [name] MRN: [id number] Date of Birth: 07-Aug-1980 Today's Date: 06/12/2024    Physical Therapist participated in the interdisciplinary team conference, providing clinical information regarding the patient's current status, treatment goals, and weekly focus, including any barriers that need to be addressed. Please see the Inpatient Rehabilitation Team Conference and Plan of Care Update for further details.    Delonta Yohannes P Horald Birky 06/12/2024, 11:45 AM

## 2024-06-12 NOTE — Progress Notes (Signed)
 Patient ID: Caitlyn [name], female   DOB: 10-Sep-1980, 43 y.o.   MRN: [id number]  Have reviewed team conference with pt and family. Both aware and agreeable with targeted d/c date of 06/18/2024 and goals of Supervision/Verbal cueing.  Will set up [facility] PT - patient will go OP PT once weightbearing resitrictions are lifted. Will order 3-1 commode and 20x16 wheelchair with bilateral elevating leg rests.

## 2024-06-13 DIAGNOSIS — Z794 Long term (current) use of insulin: Secondary | ICD-10-CM

## 2024-06-13 DIAGNOSIS — T07XXXA Unspecified multiple injuries, initial encounter: Secondary | ICD-10-CM

## 2024-06-13 DIAGNOSIS — I10 Essential (primary) hypertension: Secondary | ICD-10-CM

## 2024-06-13 DIAGNOSIS — E1165 Type 2 diabetes mellitus with hyperglycemia: Secondary | ICD-10-CM

## 2024-06-13 DIAGNOSIS — K5900 Constipation, unspecified: Secondary | ICD-10-CM

## 2024-06-13 LAB — CBC
HCT: 28.1 % — ABNORMAL LOW (ref 36.0–46.0)
Hemoglobin: 8.6 g/dL — ABNORMAL LOW (ref 12.0–15.0)
MCH: 26.5 pg (ref 26.0–34.0)
MCHC: 30.6 g/dL (ref 30.0–36.0)
MCV: 86.5 fL (ref 80.0–100.0)
Platelets: 443 K/uL — ABNORMAL HIGH (ref 150–400)
RBC: 3.25 MIL/uL — ABNORMAL LOW (ref 3.87–5.11)
RDW: 14.4 % (ref 11.5–15.5)
WBC: 6.5 K/uL (ref 4.0–10.5)
nRBC: 0 % (ref 0.0–0.2)

## 2024-06-13 LAB — GLUCOSE, CAPILLARY
Glucose-Capillary: 192 mg/dL — ABNORMAL HIGH (ref 70–99)
Glucose-Capillary: 113 mg/dL — ABNORMAL HIGH (ref 70–99)
Glucose-Capillary: 163 mg/dL — ABNORMAL HIGH (ref 70–99)
Glucose-Capillary: 192 mg/dL — ABNORMAL HIGH (ref 70–99)

## 2024-06-13 NOTE — Progress Notes (Signed)
 Patient ID: Caitlyn [name], female   DOB: 1981/05/01, 45 y.o.   MRN: [id number]  Patient's new insurance, [employer] started today 06/13/2024 ID: [id number] to replace [facility]

## 2024-06-13 NOTE — Progress Notes (Signed)
 Occupational Therapy Session Note  Patient Details  Name: Caitlyn [name] MRN: [id number] Date of Birth: [birth year]  Today's Date: 06/13/2024 OT Individual Time: 1050-1200 OT Individual Time Calculation (min): 70 min    Short Term Goals: Week 1:  OT Short Term Goal 1 (Week 1): Pt will perform toilet transfer with CGA consistently OT Short Term Goal 2 (Week 1): Pt will perform LB dress with MIN A using AE PRN OT Short Term Goal 3 (Week 1): Pt will perform clothing management for toileting with MIN A  Skilled Therapeutic Interventions/Progress Updates: Pt greeted seated in w/c, pt agreeable to OT intervention.      Transfers/bed mobility/functional mobility:  Pt completed all sit>stands with supervision, min verbal cues at times to maintain NWB.   Therapeutic activity:  Pt completed standing balnace/toleance task with pt instructed to stand while engaging in dynamic reaching during plinko game. Pt able to stand for 2 mins with CGA before needing to sit down. Pt needed min verbal cues to fully maintain NWB in RLE.   Pt completed repeated practice of sit>stands while engaging in connect 4 game. Pt instructed to stand during each turn of connect 4. Pt completed stands with supervision. Pt completed a total of 18 stands with RW.     IADLS: Practiced functional mobility in Kitchen  with RW and from w/c with pt able to take lateral hops along side of counter to simualte meal prep tasks. Pt needed mIN A- CGA for lateral hops with BUE support. Pt able to reach over head to retrieve kitchen items. Pt reports anything lower she would have her kids reach for.  Education provided on econ strategies with pt reporting that she was already using econ strategies d/t R hip pain.     Exercises: pt completed below BUE therex with 3lb dowel rod: X30 chest presses X30 shoulder flexion to 90* X40 bicep curls  X30Forward rows                   Ended session with pt seated in w/c with all needs within  reach.   Therapy Documentation Precautions:  Precautions Precautions: Fall, Other (comment) (NWB LLE; OK for hip and knee ROM per chart review; prior pain history R LE (significant)) Recall of Precautions/Restrictions: Intact Precaution/Restrictions Comments: rib fxs, LLE NWB; L hip/pelvic fx Required Braces or Orthoses: Splint/Cast Splint/Cast: L lower leg Restrictions Weight Bearing Restrictions Per Provider Order: Yes LLE Weight Bearing Per Provider Order: Non weight bearing  Pain: 6/10 pain in RLE, rest breaks provided as needed.    Therapy/Group: Individual Therapy  Ronal Gift [facility] 06/13/2024, 12:22 PM

## 2024-06-13 NOTE — Evaluation (Signed)
 Recreational Therapy Assessment and Plan  Patient Details  Name: Caitlyn [name] MRN: [id number] Date of Birth: June 01, 1981 Today's Date: 06/13/2024  Rehab Potential:  Good ELOS:   d/c 06/18/2024  Assessment Hospital Problem: Principal Problem:   Multiple traumatic injuries Active Problems:   Critical polytrauma     Past Medical History:      Past Medical History:  Diagnosis Date   Anemia     Diabetes mellitus without complication (HCC)      GM - Diet controlled - no meds   Hypertension      PIH with pregnancy   Sleep apnea      does not use CPAP - lost weight   SVD (spontaneous vaginal delivery)      x 2        Past Surgical History:       Past Surgical History:  Procedure Laterality Date   CESAREAN SECTION   03/13/2010   CESAREAN SECTION WITH BILATERAL TUBAL LIGATION N/A 02/25/2014    Procedure: CESAREAN SECTION WITH BILATERAL TUBAL LIGATION;  Surgeon: Lang JINNY Peel, DO;  Location: [facility];  Service: Obstetrics;  Laterality: N/A;   EXTERNAL FIXATION LEG Left 05/27/2024    Procedure: EXTERNAL FIXATION, LOWER EXTREMITY;  Surgeon: Georgina Ozell LABOR, MD;  Location: [facility] OR;  Service: Orthopedics;  Laterality: Left;   EXTERNAL FIXATION, ANKLE Left 05/29/2024    Procedure: ADJUSTMENT EXTERNAL FIXATION, ANKLE;  Surgeon: Kendal Franky SQUIBB, MD;  Location: [facility] OR;  Service: Orthopedics;  Laterality: Left;   HIP CLOSED REDUCTION Left 05/27/2024    Procedure: CLOSED REDUCTION, HIP AND APPLICATION TRACTION WITH PIN PLACEMENT;  Surgeon: Georgina Ozell LABOR, MD;  Location: [facility] OR;  Service: Orthopedics;  Laterality: Left;   IRRIGATION AND DEBRIDEMENT POSTERIOR HIP Left 05/27/2024    Procedure: IRRIGATION AND DEBRIDEMENT, OPEN FRACTURE;  Surgeon: Georgina Ozell LABOR, MD;  Location: [facility];  Service: Orthopedics;  Laterality: Left;   OPEN REDUCTION INTERNAL FIXATION (ORIF) TIBIA/FIBULA FRACTURE Left 05/31/2024    Procedure: OPEN REDUCTION INTERNAL FIXATION (ORIF) OF LEFT DISTAL TIBIA/FIBULA FRACTURE;   Surgeon: Kendal Franky SQUIBB, MD;  Location: [facility];  Service: Orthopedics;  Laterality: Left;   OPEN REDUCTION INTERNAL FIXATION ACETABULUM POSTERIOR LATERAL Left 05/29/2024    Procedure: OPEN REDUCTION INTERNAL FIXATION ACETABULUM POSTERIOR LATERAL;  Surgeon: Kendal Franky SQUIBB, MD;  Location: [facility];  Service: Orthopedics;  Laterality: Left;   TOOTH EXTRACTION              Assessment & Plan Clinical Impression: Caitlyn [name] 43y/o right handed female with history significant for anemia, diet-controlled diabetes mellitus, hypertension, sleep apnea not on CPAP, quit smoking 15 years ago. Per chart review patient lives with spouse and 3 children ages 17, 41 and 64 as well as a 43 year old. Third floor apartment. Presented 05/27/2024 after reportedly jumping from a third story building. EMS reported the jump was intentional (suspect suicide attempt). Admission chemistries unremarkable except alcohol 186, urine drug screen negative, hemoglobin A1c 7.7, CO2 21, glucose 296, creatinine 1.14. Cranial CT scan negative. CT cervical spine negative. CT of the chest abdomen and pelvis showed left 3rd and 5th - seventh rib fractures. No pneumothorax. Posterior left hip dislocation with associated displaced acetabular fracture. Findings of left open tib-fib fracture as well as a CT left ankle showing markedly comminuted, open distal tibial fracture with displacement and intra-articular extension. Fracture fragments along the anterior lateral talus. Underwent ORIF left acetabular, external fixator left ankle 12/16, closed reduction and washout with VAC  placement 12/17. Status post ORIF left tibia fibula and left malleolus fracture 12/19. Nonweightbearing left lower extremity. Patient also underwent ORIF of posterior left hip dislocation with associated acetabular fracture 05/29/2024 per Dr. Kendal. Hospital course acute blood loss anemia transfuse 1 unit packed red blood cells 12/18 with latest hemoglobin 8.1. Patient was  cleared to begin Lovenox  for DVT prophylaxis 05/30/2024. Blood pressure did have some increased elevations placed on low-dose amlodipine  06/03/2024. Psychiatry/behavioral health was consulted in regards to suspect suicide attempt and followed as directed and currently maintained on Cymbalta . Therapy evaluations completed due to patient decreased functional mobility was admitted for a comprehensive rehab program.   Pt presents with decreased activity tolerance, decreased functional mobility, decreased balance, decreased coordination, feelings of stress Limiting pt's independence with leisure/community pursuits.  Met with pt & husband today to discuss TR services including leisure education, activity analysis/modifications and stress management.  Also discussed the importance of social, emotional, spiritual health in addition to physical health and their effects on overall health and wellness.  Pt stated understanding.   Plan  Min 1 session >20 minutes during LOS  Recommendations for other services: Neuropsych  Discharge Criteria: Patient will be discharged from TR if patient refuses treatment 3 consecutive times without medical reason.  If treatment goals not met, if there is a change in medical status, if patient makes no progress towards goals or if patient is discharged from hospital.  The above assessment, treatment plan, treatment alternatives and goals were discussed and mutually agreed upon: by patient  [name] 06/13/2024, 3:38 PM

## 2024-06-13 NOTE — Plan of Care (Signed)
" °  Problem: Consults Goal: RH GENERAL PATIENT EDUCATION Description: See Patient Education module for education specifics. Outcome: Progressing Goal: Skin Care Protocol Initiated - if Braden Score 18 or less Description: If consults are not indicated, leave blank or document N/A Outcome: Progressing Goal: Nutrition Consult-if indicated Outcome: Progressing Goal: Diabetes Guidelines if Diabetic/Glucose > 140 Description: If diabetic or lab glucose is > 140 mg/dl - Initiate Diabetes/Hyperglycemia Guidelines & Document Interventions  Outcome: Progressing   Problem: RH SKIN INTEGRITY Goal: RH STG ABLE TO PERFORM INCISION/WOUND CARE W/ASSISTANCE Description: STG Able To Perform Incision/Wound Care With mod I Assistance. Outcome: Progressing   Problem: RH SAFETY Goal: RH STG ADHERE TO SAFETY PRECAUTIONS W/ASSISTANCE/DEVICE Description: STG Adhere to Safety Precautions With mod-I Assistance/Device. Outcome: Progressing Goal: RH STG DECREASED RISK OF FALL WITH ASSISTANCE Description: STG Decreased Risk of Fall With cues and reminders Outcome: Progressing   Problem: RH PAIN MANAGEMENT Goal: RH STG PAIN MANAGED AT OR BELOW PT'S PAIN GOAL Description: Pain level at or below 5/10 Outcome: Progressing   Problem: RH KNOWLEDGE DEFICIT GENERAL Goal: RH STG INCREASE KNOWLEDGE OF SELF CARE AFTER HOSPITALIZATION Description: Pt will be able to demonstrate understanding of lifestyle modifications and pain management using medications and non-pharmacological methods independently.  Outcome: Progressing   Problem: RH Vision Goal: RH LTG Vision (Specify) Outcome: Progressing   "

## 2024-06-13 NOTE — Progress Notes (Signed)
 Patient ID: Caitlyn [name], female   DOB: Sep 15, 1980, 44 y.o.   MRN: [id number]  Ordered 20x16 wheelchair with bilateral elevating leg rests and 3-1 commode  via [employer].

## 2024-06-13 NOTE — Progress Notes (Signed)
 "                                                        PROGRESS NOTE   Subjective/Complaints: Pain under control with current regimen. LBM yesterday.  She would like a grounds pass to go downstairs to the cafeteria/gift shop area with her husband.  ROS: as per HPI. Denies fevers, CP, SOB, abd pain, N/V/D/C, or any other complaints at this time.   + Left hip pain-improving + Periscapular muscle soreness + Denies any SI or HI  Objective:   No results found.  Recent Labs    06/13/24 0547  WBC 6.5  HGB 8.6*  HCT 28.1*  PLT 443*   No results for input(s): NA, K, CL, CO2, GLUCOSE, BUN, CREATININE, CALCIUM  in the last 72 hours.     Intake/Output Summary (Last 24 hours) at 06/13/2024 1141 Last data filed at 06/12/2024 1821 Gross per 24 hour  Intake 476 ml  Output --  Net 476 ml        Physical Exam: Vital Signs Blood pressure (!) 131/98, pulse 91, temperature 98.6 F (37 C), temperature source Oral, resp. rate 18, height 5' 4 (1.626 m), weight 103.4 kg, last menstrual period 05/20/2024, SpO2 99%.  Constitutional:      Appearance: Normal appearance. She is obese.     Comments: Sitting in wheelchair, appears comfortable HENT:     Head: Normocephalic.     Comments: Some abrasions    Right Ear: External ear normal.     Left Ear: External ear normal.     Nose: Nose normal. No congestion.     Mouth/Throat:     Mouth: Mucous membranes are moist.    Pharynx: Oropharynx is clear. No oropharyngeal exudate.  Eyes:     General:        Right eye: No discharge.        Left eye: No discharge.     Extraocular Movements: Extraocular movements intact.  Cardiovascular:     Rate and Rhythm: Normal rate and regular rhythm.     Heart sounds: Normal heart sounds. No murmur heard.    No gallop.  Pulmonary:     Effort: Pulmonary effort is normal. No respiratory distress.     Breath sounds: Normal breath sounds. No wheezing, rhonchi or rales.  Abdominal:      General: Bowel sounds are normal. There is no distension.     Palpations: Abdomen is soft.     Tenderness: There is no abdominal tenderness.  Skin:    General: Skin is warm and dry. Psychiatric:     Comments: appropriate, cooperative Musculoskeletal:  Mild TTP in periscapular muscles bilaterally  Neurological:     Comments: Patient is alert and oriented x 3, follows commands, moving all 4 extremities to gravity   PRIOR EXAMS: Musculoskeletal:     Cervical back: Neck supple.     Comments: Ue's 5/5 throughout B/L RLE 5-/5 throughout- esp HF LLE- hard to test with NWB throughout, but at least 3/5- and EHL 4/5- can lift against gravity except NT on L ankle due to cast/ACE wrap      Neurological:     Comments: Patient is alert and oriented x 3 following commands and cooperative with exam Very mild numbness/tingling in L toes, but wiggles with no difficulty- are  warm Ox3- following commands   Prior neuro assessment is c/w today's exam 06/13/2024.    Assessment/Plan: 1. Functional deficits which require 3+ hours per day of interdisciplinary therapy in a comprehensive inpatient rehab setting. Physiatrist is providing close team supervision and 24 hour management of active medical problems listed below. Physiatrist and rehab team continue to assess barriers to discharge/monitor patient progress toward functional and medical goals  Care Tool:  Bathing    Body parts bathed by patient: Right arm, Left arm, Chest, Abdomen, Right upper leg, Left upper leg, Face   Body parts bathed by helper: Front perineal area, Buttocks, Right lower leg, Left lower leg     Bathing assist Assist Level: Maximal Assistance - Patient 24 - 49%     Upper Body Dressing/Undressing Upper body dressing   What is the patient wearing?: Hospital gown only    Upper body assist Assist Level: Minimal Assistance - Patient > 75%    Lower Body Dressing/Undressing Lower body dressing      What is the patient  wearing?: Underwear/pull up, Pants     Lower body assist Assist for lower body dressing: Minimal Assistance - Patient > 75%     Toileting Toileting    Toileting assist Assist for toileting: Maximal Assistance - Patient 25 - 49%     Transfers Chair/bed transfer  Transfers assist  Chair/bed transfer activity did not occur: Safety/medical concerns  Chair/bed transfer assist level: Contact Guard/Touching assist     Locomotion Ambulation   Ambulation assist   Ambulation activity did not occur: Safety/medical concerns  Assist level: Contact Guard/Touching assist Assistive device: Walker-rolling Max distance: 55ft   Walk 10 feet activity   Assist  Walk 10 feet activity did not occur: Safety/medical concerns  Assist level: Contact Guard/Touching assist Assistive device: Walker-rolling   Walk 50 feet activity   Assist Walk 50 feet with 2 turns activity did not occur: Safety/medical concerns         Walk 150 feet activity   Assist Walk 150 feet activity did not occur: Safety/medical concerns         Walk 10 feet on uneven surface  activity   Assist Walk 10 feet on uneven surfaces activity did not occur: Safety/medical concerns         Wheelchair     Assist Is the patient using a wheelchair?: Yes Type of Wheelchair: Manual    Wheelchair assist level: Supervision/Verbal cueing Max wheelchair distance: 18ft    Wheelchair 50 feet with 2 turns activity    Assist        Assist Level: Supervision/Verbal cueing   Wheelchair 150 feet activity     Assist      Assist Level: Supervision/Verbal cueing   Blood pressure (!) 131/98, pulse 91, temperature 98.6 F (37 C), temperature source Oral, resp. rate 18, height 5' 4 (1.626 m), weight 103.4 kg, last menstrual period 05/20/2024, SpO2 99%.  Medical Problem List and Plan: 1. Functional deficits secondary to multitrauma after jumping from a third level building/suspect suicide  attempt 05/27/2024             -patient may  shower- cover LLE             -ELOS/Goals: 10-12 days- supervision- NWB LLE  -Continue CIR, team conference tomorrow  -Stairs at home is a limitation  - Exp Discharge 1/6  - Patient denies SI.  Seen by neuropsychology yesterday.  Does not appear to be high risk of self-harm.  -Will order  grounds pass   2.  Antithrombotics: -DVT/anticoagulation:  Pharmaceutical: Lovenox  30mg  BID.  Check vascular study. Transition to Eliquis versus Xarelto at discharge x 30 days             -antiplatelet therapy: N/A 3. Pain Management: Neurontin  100 mg 3 times daily, Robaxin  as needed as well as oxycodone  5 to 10 mg every 4 hours as needed- will add Lidoderm  3 patches 12 hrs on;12 hrs off- pain controlled  -12/30 increase oxycodone  frequency to q3h, increase neurontin  to 200mg  TID, kpad to upper back  -12/31 pain improved continue current regimen 4. Mood/Behavior/Sleep: Cymbalta  20 mg daily, melatonin 5 mg nightly and (psychiatry follow-up)- suggest Neuropsych for pt without family in room.  Seen by neuropsychology appreciate insight             -antipsychotic agents: N/A 5. Neuropsych/cognition: This patient is capable of making decisions on her own behalf. 6. Skin/Wound Care: Routine skin checks 7. Fluids/Electrolytes/Nutrition: Routine in and outs with follow-up chemistries, cont vitamins/supplements.  8.  Left rib fractures 3/5-7.  Pain control conservative care 9.  Left open tib-fib fracture.  Status post ORIF left acetabulum, external fixator left ankle 12/16, closed reduction and washout with VAC placement 12/17.  Status post ORIF left tibia fibula and left malleolus fracture 12/19.  Nonweightbearing.  Follow-up Dr. Kendal 10.  Posterior left hip dislocation with associated acetabular fracture.  Status post ORIF 12/17. 11.  Acute blood loss anemia.   1/1 hemoglobin stable at 8.6, continue to monitor 12.  Hypertension.  Amlodipine  5 mg daily.  Monitor with  increased mobility  -1/1 BP stable continue current regimen Vitals:   06/09/24 1445 06/09/24 2049 06/10/24 0644 06/10/24 1310  BP: 110/79 128/80 138/88 130/83   06/10/24 2012 06/11/24 0516 06/11/24 1523 06/11/24 2040  BP: (!) 145/86 131/80 (!) 152/87 136/73   06/12/24 0524 06/12/24 1648 06/12/24 2032 06/13/24 0522  BP: 131/89 (!) 130/90 136/77 (!) 131/98    13.  Constipation.  Colace 100 mg twice daily, MiraLAX  twice daily.  -12/31 LBM yesterday monitor 14.  Diabetes mellitus.  Hemoglobin A1c 7.7.  Currently on SSI. Needs additional meds long term. Consider Metformin ?  -12/30 start metformin  500mg  daily  -12/31 pt reports prior GI issues after metformin , added to allergy list, although she had his AM and reports feels OK in afternoon. Will change to glipizide 5mg  starting tomorrow  -1/1 monitor response to medication change CBG (last 3)  Recent Labs    06/12/24 1649 06/12/24 2048 06/13/24 0551  GLUCAP 206* 205* 192*    15. Elevated LFTs  -12/29 decrease tylenol  dose, monitor   Recheck pending today 16. Obesity class 2. Dietary education  Body mass index is 39.14 kg/m.    LOS: 5 days A FACE TO FACE EVALUATION WAS PERFORMED  Murray Collier 06/13/2024, 11:41 AM     "

## 2024-06-13 NOTE — Progress Notes (Signed)
 Physical Therapy Session Note  Patient Details  Name: Caitlyn [name] MRN: [id number] Date of Birth: [birth year]  Today's Date: 06/13/2024 PT Individual Time: 9154-9044 and 1401-1502 PT Individual Time Calculation (min): 70 min and 61 min  Short Term Goals: Week 1:  PT Short Term Goal 1 (Week 1): Pt will be MI with supine to sit to supine transfer PT Short Term Goal 2 (Week 1): Pt will be MI with squat pivot transfer to/from wheelchair to bed/mat table PT Short Term Goal 3 (Week 1): Pt will be able to perform wheelchair mobility > 300 ft with MI without rest PT Short Term Goal 4 (Week 1): Pt will be able to stand x 2 min LLE NWB to assist with functional RLE strengthening  Skilled Therapeutic Interventions/Progress Updates:   Treatment Session 1 Received pt semi-reclined in bed, pt agreeable to PT treatment, and reported pain 6/10 in L groin (premedicated). Session with emphasis on dressing, functional mobility/transfers, generalized strengthening and endurance, dynamic standing balance/coordination, and stair navigation. Pt transferred semi-reclined<>sitting L EOB with HOB elevated and use of bedrails with supervision. Donned pants sitting EOB with supervision using reacher and R shoe without assist. Removed nightgown and donned clean pull over shirt with setup assist. Pt received incorrect food tray - called nutrition services to place breakfast order.   Pt performed all transfers with RW and CGA/close supervision throughout session with good adherence to LLE NWB precautions. Husband arrived and participated in hands on education for remainder of session. Pt performed WC mobility 115ft using BUE and mod I to stairwell with emphasis on BUE strength/coordination. In stairwell, pt transitioned onto 2nd step with RW and CGA. Pt bumped up/down 22 6in steps (2 flights) with ++ time and multiple rest breaks due to fatigue. Pt able to bump up/down steps without assist but required heavy heavy mod A to  transition from 2nd to top step onto chair with assist provided by husband x 2 trials. When descending steps, discussed, then practiced scooting on buttocks along landing to reach next flight for energy conservation purposes to avoid additional stands and hops.   Pt returned to bed and transitioned into supine with min A for LLE management. Instructed husband in technique for assisting with AROM/PROM L hip flexor stretch for 3x1 minute hold. Pt able to advance LLE off EOB without assist but education provided to have pt completley relax and allow husband to bring LLE onto bed to prevent increased pain from active contraction. Concluded session with pt semi-reclined in bed with all needs within reach and husband, RN, and CSW at bedside.   Treatment Session 2 Received pt sitting in [facility] with husband present. Pt agreeable to PT treatment and reported pain 6/10 in L groin (premedicated). Pt performed WC mobility 125ft using BUE and mod I to dayroom with emphasis on UE strength/coordination. Pt participated in New Years dance group with emphasis on social interaction, motor planning, increasing overall activity tolerance and bimanual tasks. All songs were selected by group members. Dance moves included AROM of BUE/BLE gross motor movements with an emphasis on building functional endurance. Pt had 0.5lb ankle weights on throughout session. Pt initially anxious about participating with large group but really enjoyed group session smiling and laughing throughout. Returned to room and concluded session with pt sitting in [facility] with all needs within reach.   Therapy Documentation Precautions:  Precautions Precautions: Fall, Other (comment) (NWB LLE; OK for hip and knee ROM per chart review; prior pain history R LE (significant)) Recall  of Precautions/Restrictions: Intact Precaution/Restrictions Comments: rib fxs, LLE NWB; L hip/pelvic fx Required Braces or Orthoses: Splint/Cast Splint/Cast: L lower  leg Restrictions Weight Bearing Restrictions Per Provider Order: Yes LLE Weight Bearing Per Provider Order: Non weight bearing  Therapy/Group: Individual Therapy Therisa HERO Zaunegger Therisa Stains PT, DPT 06/13/2024, 6:53 AM

## 2024-06-14 LAB — GLUCOSE, CAPILLARY
Glucose-Capillary: 189 mg/dL — ABNORMAL HIGH (ref 70–99)
Glucose-Capillary: 142 mg/dL — ABNORMAL HIGH (ref 70–99)
Glucose-Capillary: 157 mg/dL — ABNORMAL HIGH (ref 70–99)
Glucose-Capillary: 250 mg/dL — ABNORMAL HIGH (ref 70–99)

## 2024-06-14 LAB — COMPREHENSIVE METABOLIC PANEL WITH GFR
ALT: 51 U/L — ABNORMAL HIGH (ref 0–44)
AST: 31 U/L (ref 15–41)
Albumin: 4 g/dL (ref 3.5–5.0)
Alkaline Phosphatase: 91 U/L (ref 38–126)
Anion gap: 9 (ref 5–15)
BUN: 7 mg/dL (ref 6–20)
CO2: 28 mmol/L (ref 22–32)
Calcium: 9.8 mg/dL (ref 8.9–10.3)
Chloride: 101 mmol/L (ref 98–111)
Creatinine, Ser: 0.54 mg/dL (ref 0.44–1.00)
GFR, Estimated: >60 mL/min
Glucose, Bld: 124 mg/dL — ABNORMAL HIGH (ref 70–99)
Potassium: 4.4 mmol/L (ref 3.5–5.1)
Sodium: 138 mmol/L (ref 135–145)
Total Bilirubin: 0.5 mg/dL (ref 0.0–1.2)
Total Protein: 7.5 g/dL (ref 6.5–8.1)

## 2024-06-14 LAB — CREATININE, SERUM
Creatinine, Ser: 0.43 mg/dL — ABNORMAL LOW (ref 0.44–1.00)
GFR, Estimated: >60 mL/min

## 2024-06-14 MED ORDER — METHOCARBAMOL 750 MG PO TABS
750.0000 mg | ORAL_TABLET | Freq: Four times a day (QID) | ORAL | Status: DC | PRN
  Administered 2024-06-15 – 2024-06-16 (×2): 750 mg via ORAL
  Filled 2024-06-14 (×3): qty 1

## 2024-06-14 NOTE — Progress Notes (Signed)
 Occupational Therapy Session Note  Patient Details  Name: Caitlyn [name] MRN: [id number] Date of Birth: 05/22/1981  Today's Date: 06/14/2024 OT Individual Time: 9149-9069 & 8594-8554 OT Individual Time Calculation (min): 40 min & 40 min   Short Term Goals: Week 1:  OT Short Term Goal 1 (Week 1): Pt will perform toilet transfer with CGA consistently OT Short Term Goal 2 (Week 1): Pt will perform LB dress with MIN A using AE PRN OT Short Term Goal 3 (Week 1): Pt will perform clothing management for toileting with MIN A  Skilled Therapeutic Interventions/Progress Updates:  Session 1 Skilled OT intervention completed with focus on pain management. Pt received upright in bed, agreeable to session. Unrated pain reported in bilateral trapezius and thoracic region; pre-medicated. OT offered rest manual therapy throughout for pain reduction.  Pt indicated that she was extremely sore from bumping up stairs yesterday for DC prep and was reluctant to get OOB. Pt agreeable to transition EOB (mod I) for pain management education and manual techniques. OT utilized massage gun (pt has one at home but has only used on legs) and completed massage to L traps, cervical and thoracic regions, with education and demo offered to husband on technique- specifically following the muscle bellies. Handout offered for carryover and visual representation of what muscle groups to focus on and direction to follow. Husband provided massage to R side. Education further provided on theracane for self-management and pt demonstrated ability to use independently. Discussed purchasing one for home. Pt remained seated EOB, with husband present, and with all needs in reach at end of session.  Session 2 Skilled OT intervention completed with focus on ADL retraining, functional endurance, and mobility within a shower context. Pt received seated in w/c, agreeable to session. No pain reported. Requested to shower.  Had pt and husband  complete shower in prep for assessing DC readiness. Pt completed all sit > stands and stand pivot transfers with supervision while using RW. Min cues needed for fall prevention and body positioning.  Pt recalled how to wrap leg prior to shower, and was able to instruct husband with assisting. Pt was able to bathe all parts with set up A, at the seated level while using lateral leans for periareas and long handled sponge to access RLE. Supervision transfer using grab bar > w/c with cues for both husband and pt. Able to donn shirt/deo with set up A. Donned underwear CGA for standing balance only. Donned R TEDS set up A.  L hip dressing noted to be wet after shower- nursing present for dressing changes. Pt remained seated in w/c, with husband present and with all needs in reach at end of session.   Therapy Documentation Precautions:  Precautions Precautions: Fall, Other (comment) (NWB LLE; OK for hip and knee ROM per chart review; prior pain history R LE (significant)) Recall of Precautions/Restrictions: Intact Precaution/Restrictions Comments: rib fxs, LLE NWB; L hip/pelvic fx Required Braces or Orthoses: Splint/Cast Splint/Cast: L lower leg Restrictions Weight Bearing Restrictions Per Provider Order: Yes LLE Weight Bearing Per Provider Order: Non weight bearing   Therapy/Group: Individual Therapy  [name], MS, OTR/L  06/14/2024, 2:50 PM

## 2024-06-14 NOTE — Plan of Care (Signed)
" °  Problem: Consults Goal: RH GENERAL PATIENT EDUCATION Description: See Patient Education module for education specifics. Outcome: Progressing Goal: Skin Care Protocol Initiated - if Braden Score 18 or less Description: If consults are not indicated, leave blank or document N/A Outcome: Progressing Goal: Nutrition Consult-if indicated Outcome: Progressing Goal: Diabetes Guidelines if Diabetic/Glucose > 140 Description: If diabetic or lab glucose is > 140 mg/dl - Initiate Diabetes/Hyperglycemia Guidelines & Document Interventions  Outcome: Progressing   Problem: RH SKIN INTEGRITY Goal: RH STG ABLE TO PERFORM INCISION/WOUND CARE W/ASSISTANCE Description: STG Able To Perform Incision/Wound Care With mod I Assistance. Outcome: Progressing   Problem: RH SAFETY Goal: RH STG ADHERE TO SAFETY PRECAUTIONS W/ASSISTANCE/DEVICE Description: STG Adhere to Safety Precautions With mod-I Assistance/Device. Outcome: Progressing Goal: RH STG DECREASED RISK OF FALL WITH ASSISTANCE Description: STG Decreased Risk of Fall With cues and reminders Outcome: Progressing   Problem: RH PAIN MANAGEMENT Goal: RH STG PAIN MANAGED AT OR BELOW PT'S PAIN GOAL Description: Pain level at or below 5/10 Outcome: Progressing   Problem: RH KNOWLEDGE DEFICIT GENERAL Goal: RH STG INCREASE KNOWLEDGE OF SELF CARE AFTER HOSPITALIZATION Description: Pt will be able to demonstrate understanding of lifestyle modifications and pain management using medications and non-pharmacological methods independently.  Outcome: Progressing   Problem: RH Vision Goal: RH LTG Vision (Specify) Outcome: Progressing   "

## 2024-06-14 NOTE — Progress Notes (Signed)
 Recreational Therapy Session Note  Patient Details  Name: Caitlyn [name] MRN: [id number] Date of Birth: [birth year] Today's Date: 06/14/2024  Pain: 7/10 L groin pain, premedicated  Goal:  Pt will propel w/c on simulated community surface with supervision.  MET Pt will ambulate short distances with RW navigating tight spaces with min assist.  MET Skilled Therapeutic Interventions/Progress Updates: Session focused on pt education and functional mobility in simulated community environment.  Pt taken to the gift shop w/c level.  Once in the gift shop, pt ambulated ~40' navigating tight spaces, making tight turns with RW with contact guard assist.  Pt did require verbal cues for reducing speed/positioning within RW.  Once seated, pt propelled w/c throughout the gift shop using BUEs with supervision.  Therapy/Group: Co-Treatment  Pernella Ackerley 06/14/2024, 12:15 PM

## 2024-06-14 NOTE — Progress Notes (Signed)
 Physical Therapy Session Note  Patient Details  Name: Caitlyn [name] MRN: [id number] Date of Birth: [birth year]  Today's Date: 06/14/2024 PT Individual Time: 9268-9159 and 8969-8886 PT Individual Time Calculation (min): 69 min and 43 min  Short Term Goals: Week 1:  PT Short Term Goal 1 (Week 1): Pt will be MI with supine to sit to supine transfer PT Short Term Goal 2 (Week 1): Pt will be MI with squat pivot transfer to/from wheelchair to bed/mat table PT Short Term Goal 3 (Week 1): Pt will be able to perform wheelchair mobility > 300 ft with MI without rest PT Short Term Goal 4 (Week 1): Pt will be able to stand x 2 min LLE NWB to assist with functional RLE strengthening  Skilled Therapeutic Interventions/Progress Updates:   Treatment Session 1 Received pt semi-reclined in bed asleep. Upon wakening pt reporting increased pain/soreness in L groin, chest, ribs, and upper/lower back - concerned she overdid it in PT yesterday bumping up 22 steps. Provided pt with heat packs for upper back, ice for L ankle, and requested k-pad from MD. RN notified and present to administer medications. Pt requested a few minutes lying on heat - therapist replaced R armrest on WC with new one.   Provided pt with HEP and educated on frequency/duration/technique for the following exercises: - Modified Thomas Stretch  - 1 x daily - 7 x weekly - 3 sets - 1 minute hold - Supine Straight Leg Raises  - 1 x daily - 7 x weekly - 3 sets - 10 reps - Supine Hamstring Stretch with Strap  - 1 x daily - 7 x weekly - 3 sets - 10 reps - 30 hold - Supine Hip Abduction  - 1 x daily - 7 x weekly - 3 sets - 10 reps - Seated Hip Abduction with Resistance  - 1 x daily - 7 x weekly - 3 sets - 10 reps - Seated Long Arc Quad  - 1 x daily - 7 x weekly - 3 sets - 10 reps - Seated March  - 1 x daily - 7 x weekly - 3 sets - 10 reps - Standing March with Counter Support  - 1 x daily - 7 x weekly - 3 sets - 10 reps - Standing Hip Abduction  with Counter Support  - 1 x daily - 7 x weekly - 3 sets - 10 reps - Standing Hip Extension with Counter Support  - 1 x daily - 7 x weekly - 3 sets - 10 reps - Heel Raises with Counter Support  - 1 x daily - 7 x weekly - 3 sets - 10 reps  Deferred working on stair navigation this morning due to pain/soreness but planned to practice car transfer in next session. Went through sensation, MMT, and pain interference questionnaire in preparation for discharge. Assisted pt with repositioning in bed for comfort and concluded session with pt semi-reclined in bed with all needs within reach and husband at bedside awaiting upcoming OT session.   Treatment Session 2 Received pt sitting EOB getting dressed with husband at bedside. Pt agreeable to PT treatment and reported pain 7/10 in L groin. Session with emphasis on functional mobility/transfers, dressing, generalized strengthening and endurance, simulated car transfers, community navigation, and ambulation. Pt performed all transfers with RW and CGA fading to close supervision throughout session. Stood and pulled pants over hips with CGA for balance and donned R compression sock with total A and shoe with max A.  Pt transported to/from room in [facility] dependently for time management purposes. Pt performed simulated car transfer with RW and CGA provided by husband (min A for LLE management) - recommended sliding seat all the way back and reclining for comfort. 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 Pt ambulated ~2ft with RW and CGA/close supervision (cues provided for energy conservation, to decrease cadence, and to keep RW within BOS). Then transitioned to [facility] mobility ~48ft using BUE and supervision around giftshop with emphasis on navigating tight turns/corners to simulate home environment. Returned to room and concluded session with pt sitting in [facility] with all needs within reach.   Therapy  Documentation Precautions:  Precautions Precautions: Fall, Other (comment) (NWB LLE; OK for hip and knee ROM per chart review; prior pain history R LE (significant)) Recall of Precautions/Restrictions: Intact Precaution/Restrictions Comments: rib fxs, LLE NWB; L hip/pelvic fx Required Braces or Orthoses: Splint/Cast Splint/Cast: L lower leg Restrictions Weight Bearing Restrictions Per Provider Order: Yes LLE Weight Bearing Per Provider Order: Non weight bearing  Therapy/Group: Individual Therapy Therisa HERO Zaunegger Therisa Stains PT, DPT 06/14/2024, 6:50 AM

## 2024-06-14 NOTE — Progress Notes (Addendum)
 "                                                        PROGRESS NOTE   Subjective/Complaints: Reports she had soreness and pain in her muscles throughout arms, periscapular muscles, back and also her legs after doing a lot of work with therapy yesterday.  Worse around her periscapular muscles.  Improving with medications today.  Work with recreation therapy yesterday.  ROS: as per HPI. Denies fevers, CP, SOB, abd pain, N/V/D/C, or any other complaints at this time.   + Left hip pain-continued, overall controlled with medication + Periscapular muscle soreness + Denies any SI or HI  Objective:   No results found.  Recent Labs    06/13/24 0547  WBC 6.5  HGB 8.6*  HCT 28.1*  PLT 443*   Recent Labs    06/14/24 0539  CREATININE 0.43*       Intake/Output Summary (Last 24 hours) at 06/14/2024 1144 Last data filed at 06/13/2024 1200 Gross per 24 hour  Intake 120 ml  Output --  Net 120 ml        Physical Exam: Vital Signs Blood pressure 129/79, pulse 85, temperature 98.6 F (37 C), temperature source Oral, resp. rate 18, height 5' 4 (1.626 m), weight 103.4 kg, last menstrual period 05/20/2024, SpO2 100%.  Constitutional:      Appearance: Normal appearance. She is obese.     Comments: Sitting in wheelchair, appears comfortable HENT:     Head: Normocephalic.     Comments: Some abrasions    Right Ear: External ear normal.     Left Ear: External ear normal.     Nose: Nose normal. No congestion.     Mouth/Throat:     Mouth: Mucous membranes are moist.    Pharynx: Oropharynx is clear. No oropharyngeal exudate.  Eyes:     General:        Right eye: No discharge.        Left eye: No discharge.     Extraocular Movements: Extraocular movements intact.  Cardiovascular:     Rate and Rhythm: Normal rate and regular rhythm.     Heart sounds: Normal heart sounds. No murmur heard.    No gallop.  Pulmonary:     Effort: Pulmonary effort is normal. No respiratory distress.      Breath sounds: Normal breath sounds. No wheezing, rhonchi or rales.  Abdominal:     General: Bowel sounds are normal. There is no distension.     Palpations: Abdomen is soft.     Tenderness: There is no abdominal tenderness.  Skin:    General: Skin is warm and dry. Psychiatric:     Comments: appropriate, cooperative Musculoskeletal:  Mild TTP in periscapular muscles bilaterally, worse around the rhomboids.  She is using Theracane with benefit  Neurological:     Comments: Patient is alert and oriented x 3, follows commands, moving all 4 extremities to gravity   PRIOR EXAMS: Musculoskeletal:     Cervical back: Neck supple.     Comments: Ue's 5/5 throughout B/L RLE 5-/5 throughout- esp HF LLE- hard to test with NWB throughout, but at least 3/5- and EHL 4/5- can lift against gravity except NT on L ankle due to cast/ACE wrap      Neurological:  Comments: Patient is alert and oriented x 3 following commands and cooperative with exam Very mild numbness/tingling in L toes, but wiggles with no difficulty- are warm Ox3- following commands   Prior neuro assessment is c/w today's exam 06/14/2024.    Assessment/Plan: 1. Functional deficits which require 3+ hours per day of interdisciplinary therapy in a comprehensive inpatient rehab setting. Physiatrist is providing close team supervision and 24 hour management of active medical problems listed below. Physiatrist and rehab team continue to assess barriers to discharge/monitor patient progress toward functional and medical goals  Care Tool:  Bathing    Body parts bathed by patient: Right arm, Left arm, Chest, Abdomen, Right upper leg, Left upper leg, Face   Body parts bathed by helper: Front perineal area, Buttocks, Right lower leg, Left lower leg     Bathing assist Assist Level: Maximal Assistance - Patient 24 - 49%     Upper Body Dressing/Undressing Upper body dressing   What is the patient wearing?: Hospital gown only     Upper body assist Assist Level: Minimal Assistance - Patient > 75%    Lower Body Dressing/Undressing Lower body dressing      What is the patient wearing?: Underwear/pull up, Pants     Lower body assist Assist for lower body dressing: Minimal Assistance - Patient > 75%     Toileting Toileting    Toileting assist Assist for toileting: Maximal Assistance - Patient 25 - 49%     Transfers Chair/bed transfer  Transfers assist  Chair/bed transfer activity did not occur: Safety/medical concerns  Chair/bed transfer assist level: Contact Guard/Touching assist     Locomotion Ambulation   Ambulation assist   Ambulation activity did not occur: Safety/medical concerns  Assist level: Contact Guard/Touching assist Assistive device: Walker-rolling Max distance: 23ft   Walk 10 feet activity   Assist  Walk 10 feet activity did not occur: Safety/medical concerns  Assist level: Contact Guard/Touching assist Assistive device: Walker-rolling   Walk 50 feet activity   Assist Walk 50 feet with 2 turns activity did not occur: Safety/medical concerns         Walk 150 feet activity   Assist Walk 150 feet activity did not occur: Safety/medical concerns         Walk 10 feet on uneven surface  activity   Assist Walk 10 feet on uneven surfaces activity did not occur: Safety/medical concerns         Wheelchair     Assist Is the patient using a wheelchair?: Yes Type of Wheelchair: Manual    Wheelchair assist level: Supervision/Verbal cueing Max wheelchair distance: 192ft    Wheelchair 50 feet with 2 turns activity    Assist        Assist Level: Supervision/Verbal cueing   Wheelchair 150 feet activity     Assist      Assist Level: Supervision/Verbal cueing   Blood pressure 129/79, pulse 85, temperature 98.6 F (37 C), temperature source Oral, resp. rate 18, height 5' 4 (1.626 m), weight 103.4 kg, last menstrual period 05/20/2024, SpO2  100%.  Medical Problem List and Plan: 1. Functional deficits secondary to multitrauma after jumping from a third level building/suspect suicide attempt 05/27/2024             -patient may  shower- cover LLE             -ELOS/Goals: 10-12 days- supervision- NWB LLE  -Continue CIR, team conference tomorrow  -Stairs at home is a limitation  - Exp  Discharge 1/6  - Patient denies SI.  Seen by neuropsychology yesterday.  Does not appear to be high risk of self-harm.  - Grounds pass ordered  2.  Antithrombotics: -DVT/anticoagulation:  Pharmaceutical: Lovenox  30mg  BID.  Check vascular study. Transition to Eliquis versus Xarelto at discharge x 30 days             -antiplatelet therapy: N/A 3. Pain Management: Neurontin  100 mg 3 times daily, Robaxin  as needed as well as oxycodone  5 to 10 mg every 4 hours as needed- will add Lidoderm  3 patches 12 hrs on;12 hrs off- pain controlled  -12/30 increase oxycodone  frequency to q3h, increase neurontin  to 200mg  TID, kpad to upper back  -12/31 pain improved continue current regimen  -1/2 increase Robaxin  to 750 mg dose, she reports this helps her pain.  Could consider trigger point injections however she would like to hold off for now.  Heating pad. 4. Mood/Behavior/Sleep: Cymbalta  20 mg daily, melatonin 5 mg nightly and (psychiatry follow-up)- Seen by neuropsychology appreciate insight             -antipsychotic agents: N/A 5. Neuropsych/cognition: This patient is capable of making decisions on her own behalf. 6. Skin/Wound Care: Routine skin checks 7. Fluids/Electrolytes/Nutrition: Routine in and outs with follow-up chemistries, cont vitamins/supplements.  8.  Left rib fractures 3/5-7.  Pain control conservative care 9.  Left open tib-fib fracture.  Status post ORIF left acetabulum, external fixator left ankle 12/16, closed reduction and washout with VAC placement 12/17.  Status post ORIF left tibia fibula and left malleolus fracture 12/19.  Nonweightbearing.   Follow-up Dr. Kendal 10.  Posterior left hip dislocation with associated acetabular fracture.  Status post ORIF 12/17. 11.  Acute blood loss anemia.   1/1 hemoglobin stable at 8.6, continue to monitor 12.  Hypertension.  Amlodipine  5 mg daily.  Monitor with increased mobility  -1/2 controlled overall, continue to monitor Vitals:   06/10/24 1310 06/10/24 2012 06/11/24 0516 06/11/24 1523  BP: 130/83 (!) 145/86 131/80 (!) 152/87   06/11/24 2040 06/12/24 0524 06/12/24 1648 06/12/24 2032  BP: 136/73 131/89 (!) 130/90 136/77   06/13/24 0522 06/13/24 1358 06/13/24 2117 06/14/24 0552  BP: (!) 131/98 131/85 128/74 129/79    13.  Constipation.  Colace 100 mg twice daily, MiraLAX  twice daily.  -1/2 LBM today continue to monitor 14.  Diabetes mellitus.  Hemoglobin A1c 7.7.  Currently on SSI. Needs additional meds long term. Consider Metformin ?  -12/30 start metformin  500mg  daily  -12/31 pt reports prior GI issues after metformin , added to allergy list, although she had his AM and reports feels OK in afternoon. Will change to glipizide 5mg  starting tomorrow  -12/2 continue to monitor trend for now CBG (last 3)  Recent Labs    06/13/24 2114 06/14/24 0550 06/14/24 1137  GLUCAP 192* 189* 142*    15. Elevated LFTs  -12/29 decrease tylenol  dose, monitor   -1/2 labs Not completed yesterday although appears she had an order for it?  Ordered for today 16. Obesity class 2. Dietary education  Body mass index is 39.14 kg/m.    LOS: 6 days A FACE TO FACE EVALUATION WAS PERFORMED  Murray Collier 06/14/2024, 11:44 AM     "

## 2024-06-14 NOTE — Progress Notes (Addendum)
 Patient ID: Caitlyn [name], female   DOB: 04/04/1981, 44 y.o.   MRN: [id number]  Provided patient with original letter for more accessible housing.   Will order DME via [employer] since [employer] is not in network. Patient will purchase TTB oop and work on a payment plan with [employer] for the wheelchair.

## 2024-06-15 DIAGNOSIS — T07XXXA Unspecified multiple injuries, initial encounter: Secondary | ICD-10-CM

## 2024-06-15 DIAGNOSIS — E119 Type 2 diabetes mellitus without complications: Secondary | ICD-10-CM

## 2024-06-15 DIAGNOSIS — Z794 Long term (current) use of insulin: Secondary | ICD-10-CM

## 2024-06-15 DIAGNOSIS — I10 Essential (primary) hypertension: Secondary | ICD-10-CM

## 2024-06-15 DIAGNOSIS — K5901 Slow transit constipation: Secondary | ICD-10-CM

## 2024-06-15 LAB — GLUCOSE, CAPILLARY
Glucose-Capillary: 249 mg/dL — ABNORMAL HIGH (ref 70–99)
Glucose-Capillary: 124 mg/dL — ABNORMAL HIGH (ref 70–99)
Glucose-Capillary: 167 mg/dL — ABNORMAL HIGH (ref 70–99)

## 2024-06-15 MED ORDER — INSULIN ASPART 100 UNIT/ML IJ SOLN
0.0000 [IU] | Freq: Every day | INTRAMUSCULAR | Status: DC
  Administered 2024-06-16 – 2024-06-17 (×3): 2 [IU] via SUBCUTANEOUS
  Filled 2024-06-15: qty 5
  Filled 2024-06-15 (×2): qty 2

## 2024-06-15 NOTE — Progress Notes (Signed)
 Physical Therapy Session Note  Patient Details  Name: MODEST DRAEGER MRN: [id number] Date of Birth: 31-Aug-1980  Today's Date: 06/15/2024 PT Individual Time: 9053-8959 and 1345-1425 PT Individual Time Calculation (min): 54 min and 40 min  Short Term Goals: Week 1:  PT Short Term Goal 1 (Week 1): Pt will be MI with supine to sit to supine transfer PT Short Term Goal 2 (Week 1): Pt will be MI with squat pivot transfer to/from wheelchair to bed/mat table PT Short Term Goal 3 (Week 1): Pt will be able to perform wheelchair mobility > 300 ft with MI without rest PT Short Term Goal 4 (Week 1): Pt will be able to stand x 2 min LLE NWB to assist with functional RLE strengthening  Skilled Therapeutic Interventions/Progress Updates:   Treatment Session 1 Received pt sitting in [facility] with husband present for family education training. Pt agreeable to PT treatment and reported pain 6/10 in L groin (premedicated). Session with emphasis on discharge planning, functional mobility/transfers, generalized strengthening and endurance, and stair navigation. Pt performed all transfers with RW and supervision/mod I throughout session with good adherence to LLE NWB precautions. Pt able to pull pants over hips in standing without assist and donned R shoe mod I.   Pt transported to/from room in [facility] dependently for time management purposes. In stairwell, pt lowered herself onto 2nd step and bumped up/down flights of 3 steps to simulate home entry. Pt required min cues for R foot placement on various step heights. Pt's biggest difficulty remains transition from to chair/WC at top of staircase and requires mod A from husband. However, placed 3in step at top of step to raise pt up higher before transitioning into WC. Recommended purchasing 3in step on [employer] and husband planning on ordering it. Also discussed scooting on buttocks along landings in between flights of steps for energy conservation purposes to avoid excessive standing  and hopping. Pt required frequent rest breaks throughout but pt/husband verbalized confidence with task. Returned to room and husband assisted with transfer back to bed (with WC parts management). Transitioned into supine mod I and husband assisted with passive L hip flexor stretch (both pt/husband demo good recall of technique). Pt left semi-reclined in bed with all needs within reach and husband at bedside.   Treatment Session 2 Received pt sitting in [facility] with husband at bedside. Pt agreeable to PT treatment and did not state pain level - noted improvements in edema in LLE. Session with emphasis on functional mobility/transfers, generalized strengthening and endurance, dynamic standing balance/coordination, and ambulation. Transported to/from room in [facility] dependently. Worked on UE strength and cardiovascular endurance with WC mobility - pt propelled WC 528ft with mod I but required frequent rest breaks due to UE fatigue and soreness. Used theracane and performed STM to upper/middle trapezius and rhomboids.  Pt able to stand and pick up object from floor using reacher and mod I, then ambulated 44ft with RW and supervision using a hop to pattern - limited by 10/10 fatigue. Transitioned to seated RLE strengthening on Kinetron at 20 cm/sec for 1 minute x 1 and 10cm/sec for 1 minute x 2 with therapist providing manual counter resistance with emphasis on glute/quad strength. Returned to room and concluded session with pt sitting in [facility] with all needs within reach.   Therapy Documentation Precautions:  Precautions Precautions: Fall, Other (comment) (NWB LLE; OK for hip and knee ROM per chart review; prior pain history R LE (significant)) Recall of Precautions/Restrictions: Intact Precaution/Restrictions Comments: rib fxs,  LLE NWB; L hip/pelvic fx Required Braces or Orthoses: Splint/Cast Splint/Cast: L lower leg Restrictions Weight Bearing Restrictions Per Provider Order: Yes LLE Weight Bearing Per Provider  Order: Non weight bearing  Therapy/Group: Individual Therapy Therisa HERO Zaunegger Therisa Stains PT, DPT 06/15/2024, 6:41 AM

## 2024-06-15 NOTE — Progress Notes (Signed)
 Physical Therapy Discharge Summary  Patient Details  Name: Caitlyn [name] MRN: [id number] Date of Birth: 15-Mar-1981  Date of Discharge from PT service:June 18, 2023  Patient has met 5 of 5 long term goals due to improved activity tolerance, improved balance, improved postural control, increased strength, increased range of motion, decreased pain, ability to compensate for deficits, improved awareness, and improved coordination.  Patient to discharge at a wheelchair level Modified Independent. Patient's care partner is independent to provide the necessary physical assistance at discharge. Pt's husband present for multiple hands on family education sessions and verbalized and demonstrated confidence with all tasks to ensure safe discharge home.   All goals met   Recommendation:  Patient will benefit from ongoing skilled PT services in outpatient setting to continue to advance safe functional mobility, address ongoing impairments in transfers, generalized strengthening and endurance, dynamic standing balance/coordination, NMR, gait training, stair navigation, and to minimize fall risk.  Equipment: 20x16 manual WC with bilateral elevating legrests, pt to purchase RW OOP  Reasons for discharge: treatment goals met  Patient/family agrees with progress made and goals achieved: Yes  PT Discharge Precautions/Restrictions Precautions Precautions: Fall;Other (comment) Recall of Precautions/Restrictions: Intact Precaution/Restrictions Comments: rib fxs, LLE NWB; L hip/pelvic fx Required Braces or Orthoses: Splint/Cast Splint/Cast: L lower leg Restrictions Weight Bearing Restrictions Per Provider Order: Yes LLE Weight Bearing Per Provider Order: Non weight bearing Pain Interference Pain Interference Pain Effect on Sleep: 3. Frequently Pain Interference with Therapy Activities: 2. Occasionally Pain Interference with Day-to-Day Activities: 1. Rarely or not at all Cognition Overall Cognitive  Status: Within Functional Limits for tasks assessed Arousal/Alertness: Awake/alert Orientation Level: Oriented X4 Memory: Appears intact Awareness: Appears intact Problem Solving: Appears intact Safety/Judgment: Appears intact Comments: anxious Sensation Sensation Light Touch: Impaired Detail Light Touch Impaired Details: Impaired LLE Hot/Cold: Not tested Proprioception: Appears Intact Additional Comments: numbness along L great toe Coordination Gross Motor Movements are Fluid and Coordinated: No Fine Motor Movements are Fluid and Coordinated: Not tested Coordination and Movement Description: altered balance strategies due to LLE NWB precautions and pain Motor  Motor Motor: Abnormal postural alignment and control Motor - Skilled Clinical Observations: altered balance strategies due to LLE NWB precautions and pain  Mobility Bed Mobility Bed Mobility: Rolling Right;Rolling Left;Supine to Sit;Sit to Supine Rolling Right: Independent with assistive device Rolling Left: Independent with assistive device Supine to Sit: Independent with assistive device Sit to Supine: Independent with assistive device Transfers Transfers: Sit to Stand;Stand to Sit;Stand Pivot Transfers Sit to Stand: Independent with assistive device Stand to Sit: Independent with assistive device Stand Pivot Transfers: Independent with assistive device Transfer (Assistive device): Rolling walker Locomotion  Gait Ambulation: Yes Gait Assistance: Supervision/Verbal cueing Gait Distance (Feet): 40 Feet Assistive device: Rolling walker Gait Gait: Yes Gait Pattern: Impaired (hop to) Gait velocity: decreased Stairs / Additional Locomotion Stairs: Yes Stairs Assistance: Supervision/Verbal cueing Stair Management Technique: Two rails;Other (comment) (bumping up on buttocks) Number of Stairs: 33 Height of Stairs: 6 Pick up small object from the floor assist level: Independent with assistive device Pick up small  object from the floor assistive device: reacher and Psychologist, Educational: Yes Wheelchair Assistance: Independent with Scientist, Research (life Sciences): Both upper extremities Wheelchair Parts Management: Needs assistance Distance: 548ft  Trunk/Postural Assessment  Cervical Assessment Cervical Assessment: Within Functional Limits Thoracic Assessment Thoracic Assessment: Within Functional Limits Lumbar Assessment Lumbar Assessment: Within Functional Limits Postural Control Postural Control: Deficits on evaluation Righting Reactions: slightly delayed Protective Responses: slightly delayed  Balance Balance Balance Assessed: Yes Static Sitting Balance Static Sitting - Balance Support: Feet supported;Bilateral upper extremity supported Static Sitting - Level of Assistance: 7: Independent Dynamic Sitting Balance Dynamic Sitting - Balance Support: Feet unsupported;No upper extremity supported Dynamic Sitting - Level of Assistance: 6: Modified independent (Device/Increase time) Static Standing Balance Static Standing - Balance Support: Bilateral upper extremity supported;During functional activity (RW) Static Standing - Level of Assistance: 6: Modified independent (Device/Increase time) Dynamic Standing Balance Dynamic Standing - Balance Support: Bilateral upper extremity supported;During functional activity (RW) Dynamic Standing - Level of Assistance: 6: Modified independent (Device/Increase time) Dynamic Standing - Comments: with transfers Extremity Assessment  RLE Assessment RLE Assessment: Within Functional Limits LLE Assessment LLE Assessment: Exceptions to [facility] General Strength Comments: not formally tested and limited by L cast - grossly 3/5  Enslee Bibbins M Zaunegger Therisa Stains PT, DPT 06/15/2024, 7:04 AM

## 2024-06-15 NOTE — Progress Notes (Signed)
 "                                                        PROGRESS NOTE   Subjective/Complaints:  Pt doing well, slept better last night, pain doing fine, LBM yesterday, urinating fine. No other complaints or concerns.   ROS: as per HPI. Denies fevers, CP, SOB, abd pain, N/V/D/C, or any other complaints at this time.   + Left hip pain-continued, overall controlled with medication + Periscapular muscle soreness - Denies any SI or HI  Objective:   No results found.  Recent Labs    06/13/24 0547  WBC 6.5  HGB 8.6*  HCT 28.1*  PLT 443*   Recent Labs    06/14/24 0539 06/14/24 1405  NA  --  138  K  --  4.4  CL  --  101  CO2  --  28  GLUCOSE  --  124*  BUN  --  7  CREATININE 0.43* 0.54  CALCIUM   --  9.8       Intake/Output Summary (Last 24 hours) at 06/15/2024 1122 Last data filed at 06/14/2024 1839 Gross per 24 hour  Intake 600 ml  Output --  Net 600 ml        Physical Exam: Vital Signs Blood pressure 117/72, pulse 85, temperature 98.6 F (37 C), temperature source Oral, resp. rate 17, height 5' 4 (1.626 m), weight 103.4 kg, last menstrual period 05/20/2024, SpO2 98%.  Constitutional:      Appearance: Normal appearance. She is obese.     Comments: resting comfortably in bed HENT:     Head: Normocephalic.     Comments: Some healing abrasions    Right Ear: External ear normal.     Left Ear: External ear normal.     Nose: Nose normal. No congestion.     Mouth/Throat:     Mouth: Mucous membranes are moist. Eyes:     General:        Right eye: No discharge.        Left eye: No discharge.     Extraocular Movements: Extraocular movements grossly intact.  Cardiovascular:     Rate and Rhythm: Normal rate and regular rhythm.     Heart sounds: Normal heart sounds. No murmur heard.    No gallop.  Pulmonary:     Effort: Pulmonary effort is normal. No respiratory distress.     Breath sounds: Normal breath sounds. No wheezing, rhonchi or rales.  Abdominal:      General: Bowel sounds are normal. There is no distension.     Palpations: Abdomen is soft.     Tenderness: There is no abdominal tenderness.  Skin:    General: Skin is warm and dry. Psychiatric:     Comments: appropriate, cooperative Neurological:     Comments: Patient is alert and oriented x 3, follows commands, moving all 4 extremities to gravity   PRIOR EXAMS: Musculoskeletal:  Mild TTP in periscapular muscles bilaterally, worse around the rhomboids.  She is using Theracane with benefit Musculoskeletal:     Cervical back: Neck supple.     Comments: Ue's 5/5 throughout B/L RLE 5-/5 throughout- esp HF LLE- hard to test with NWB throughout, but at least 3/5- and EHL 4/5- can lift against gravity except NT on L ankle due to  cast/ACE wrap      Neurological:     Comments: Patient is alert and oriented x 3 following commands and cooperative with exam Very mild numbness/tingling in L toes, but wiggles with no difficulty- are warm Ox3- following commands      Assessment/Plan: 1. Functional deficits which require 3+ hours per day of interdisciplinary therapy in a comprehensive inpatient rehab setting. Physiatrist is providing close team supervision and 24 hour management of active medical problems listed below. Physiatrist and rehab team continue to assess barriers to discharge/monitor patient progress toward functional and medical goals  Care Tool:  Bathing    Body parts bathed by patient: Right arm, Left arm, Chest, Abdomen, Right upper leg, Left upper leg, Face, Front perineal area, Buttocks, Right lower leg   Body parts bathed by helper: Front perineal area, Buttocks, Right lower leg, Left lower leg Body parts n/a: Left lower leg   Bathing assist Assist Level: Set up assist     Upper Body Dressing/Undressing Upper body dressing   What is the patient wearing?: Pull over shirt    Upper body assist Assist Level: Set up assist    Lower Body Dressing/Undressing Lower body  dressing      What is the patient wearing?: Underwear/pull up, Pants     Lower body assist Assist for lower body dressing: Contact Guard/Touching assist     Toileting Toileting    Toileting assist Assist for toileting: Contact Guard/Touching assist     Transfers Chair/bed transfer  Transfers assist  Chair/bed transfer activity did not occur: Safety/medical concerns  Chair/bed transfer assist level: Independent with assistive device Chair/bed transfer assistive device: Geologist, Engineering   Ambulation assist   Ambulation activity did not occur: Safety/medical concerns  Assist level: Supervision/Verbal cueing Assistive device: Walker-rolling Max distance: 40   Walk 10 feet activity   Assist  Walk 10 feet activity did not occur: Safety/medical concerns  Assist level: Supervision/Verbal cueing Assistive device: Walker-rolling   Walk 50 feet activity   Assist Walk 50 feet with 2 turns activity did not occur: Safety/medical concerns (fatigue, weakness, pain)         Walk 150 feet activity   Assist Walk 150 feet activity did not occur: Safety/medical concerns (fatigue, weakness, pain)         Walk 10 feet on uneven surface  activity   Assist Walk 10 feet on uneven surfaces activity did not occur: Safety/medical concerns (fatigue, weakness, pain)         Wheelchair     Assist Is the patient using a wheelchair?: Yes Type of Wheelchair: Manual    Wheelchair assist level: Supervision/Verbal cueing Max wheelchair distance: 1106ft    Wheelchair 50 feet with 2 turns activity    Assist        Assist Level: Supervision/Verbal cueing   Wheelchair 150 feet activity     Assist      Assist Level: Supervision/Verbal cueing   Blood pressure 117/72, pulse 85, temperature 98.6 F (37 C), temperature source Oral, resp. rate 17, height 5' 4 (1.626 m), weight 103.4 kg, last menstrual period 05/20/2024, SpO2 98%.  Medical  Problem List and Plan: 1. Functional deficits secondary to multitrauma after jumping from a third level building/suspect suicide attempt 05/27/2024             -patient may  shower- cover LLE             -ELOS/Goals: 10-12 days- supervision- NWB LLE  -Continue CIR, team  conference tomorrow  -Stairs at home is a limitation  - Exp Discharge 1/6 - Patient denies SI.  Seen by neuropsychology yesterday.  Does not appear to be high risk of self-harm.  - Grounds pass ordered  2.  Antithrombotics: -DVT/anticoagulation:  Pharmaceutical: Lovenox  30mg  BID.  Check vascular study. Transition to Eliquis  versus Xarelto  at discharge x 30 days             -antiplatelet therapy: N/A  3. Pain Management: Neurontin  100 mg 3 times daily, Robaxin  as needed as well as oxycodone  5 to 10 mg every 4 hours as needed- will add Lidoderm  3 patches 12 hrs on;12 hrs off- pain controlled  -12/30 increase oxycodone  frequency to q3h, increase neurontin  to 200mg  TID, kpad to upper back  -12/31 pain improved continue current regimen -1/2 increase Robaxin  to 750 mg dose, she reports this helps her pain.  Could consider trigger point injections however she would like to hold off for now.  Heating pad.  4. Mood/Behavior/Sleep: Cymbalta  20 mg daily, melatonin 5 mg nightly and (psychiatry follow-up)- Seen by neuropsychology appreciate insight             -antipsychotic agents: N/A 5. Neuropsych/cognition: This patient is capable of making decisions on her own behalf. 6. Skin/Wound Care: Routine skin checks 7. Fluids/Electrolytes/Nutrition: Routine in and outs with follow-up chemistries, cont vitamins/supplements.   8.  Left rib fractures 3/5-7.  Pain control conservative care  9.  Left open tib-fib fracture.  Status post ORIF left acetabulum, external fixator left ankle 12/16, closed reduction and washout with VAC placement 12/17.  Status post ORIF left tibia fibula and left malleolus fracture 12/19.  Nonweightbearing.  Follow-up  Dr. Kendal  10.  Posterior left hip dislocation with associated acetabular fracture.  Status post ORIF 12/17.  11.  Acute blood loss anemia.   1/1 hemoglobin stable at 8.6, continue to monitor  12.  Hypertension.  Amlodipine  5 mg daily.  Monitor with increased mobility  -1/2-3 controlled overall, continue to monitor Vitals:   06/11/24 1523 06/11/24 2040 06/12/24 0524 06/12/24 1648  BP: (!) 152/87 136/73 131/89 (!) 130/90   06/12/24 2032 06/13/24 0522 06/13/24 1358 06/13/24 2117  BP: 136/77 (!) 131/98 131/85 128/74   06/14/24 0552 06/14/24 1253 06/14/24 1938 06/15/24 0455  BP: 129/79 (!) 121/96 117/74 117/72    13.  Constipation.  Colace 100 mg twice daily, MiraLAX  twice daily.  -06/15/24 LBM yesterday, monitor  14.  Diabetes mellitus.  Hemoglobin A1c 7.7.  Currently on SSI. Needs additional meds long term. Consider Metformin ?  -12/30 start metformin  500mg  daily -12/31 pt reports prior GI issues after metformin , added to allergy list, although she had his AM and reports feels OK in afternoon. Will change to glipizide  5mg  starting tomorrow -06/15/24 CBGs elevated last night and this morning, but just started glipizide  1/1, will add nightly SSI. Continue to monitor CBG (last 3)  Recent Labs    06/14/24 1650 06/14/24 2144 06/15/24 0546  GLUCAP 157* 250* 249*    15. Elevated LFTs -12/29 decrease tylenol  dose, monitor  -1/2 labs Not completed yesterday although appears she had an order for it?  Ordered for today  16. Obesity class 2. Dietary education  Body mass index is 39.14 kg/m.    LOS: 7 days A FACE TO FACE EVALUATION WAS PERFORMED  [address] 06/15/2024, 11:22 AM     "

## 2024-06-16 DIAGNOSIS — I10 Essential (primary) hypertension: Secondary | ICD-10-CM

## 2024-06-16 DIAGNOSIS — K5901 Slow transit constipation: Secondary | ICD-10-CM

## 2024-06-16 DIAGNOSIS — E119 Type 2 diabetes mellitus without complications: Secondary | ICD-10-CM

## 2024-06-16 DIAGNOSIS — T07XXXA Unspecified multiple injuries, initial encounter: Secondary | ICD-10-CM

## 2024-06-16 DIAGNOSIS — R739 Hyperglycemia, unspecified: Secondary | ICD-10-CM

## 2024-06-16 DIAGNOSIS — Z794 Long term (current) use of insulin: Secondary | ICD-10-CM

## 2024-06-16 LAB — GLUCOSE, CAPILLARY
Glucose-Capillary: 131 mg/dL — ABNORMAL HIGH (ref 70–99)
Glucose-Capillary: 164 mg/dL — ABNORMAL HIGH (ref 70–99)
Glucose-Capillary: 223 mg/dL — ABNORMAL HIGH (ref 70–99)
Glucose-Capillary: 236 mg/dL — ABNORMAL HIGH (ref 70–99)
Glucose-Capillary: 240 mg/dL — ABNORMAL HIGH (ref 70–99)

## 2024-06-16 NOTE — Progress Notes (Signed)
 "                                                        PROGRESS NOTE   Subjective/Complaints:  Pt doing well again today, slept well, pain doing ok but mentions R shoulder still bothersome (sounds muscular?), discussed bringing up trigger point injections this week. LBM yesterday per pt but not documented, urinating fine. No other complaints or concerns.   ROS: as per HPI. Denies fevers, CP, SOB, abd pain, N/V/D/C, or any other complaints at this time.   + Left hip pain-continued, overall controlled with medication + R Periscapular muscle soreness - Denies any SI or HI  Objective:   No results found.  No results for input(s): WBC, HGB, HCT, PLT in the last 72 hours.  Recent Labs    06/14/24 0539 06/14/24 1405  NA  --  138  K  --  4.4  CL  --  101  CO2  --  28  GLUCOSE  --  124*  BUN  --  7  CREATININE 0.43* 0.54  CALCIUM   --  9.8      No intake or output data in the 24 hours ending 06/16/24 1109       Physical Exam: Vital Signs Blood pressure 125/65, pulse 91, temperature 98.5 F (36.9 C), temperature source Oral, resp. rate 18, height 5' 4 (1.626 m), weight 101.7 kg, last menstrual period 05/20/2024, SpO2 99%.  Constitutional:      Appearance: Normal appearance. She is obese.     Comments: resting comfortably in bed HENT:     Head: Normocephalic.     Comments: Some healing abrasions    Right Ear: External ear normal.     Left Ear: External ear normal.     Nose: Nose normal. No congestion.     Mouth/Throat:     Mouth: Mucous membranes are moist. Eyes:     General:        Right eye: No discharge.        Left eye: No discharge.     Extraocular Movements: Extraocular movements grossly intact.  Cardiovascular:     Rate and Rhythm: Normal rate and regular rhythm.     Heart sounds: Normal heart sounds. No murmur heard.    No gallop.  Pulmonary:     Effort: Pulmonary effort is normal. No respiratory distress.     Breath sounds: Normal breath  sounds. No wheezing, rhonchi or rales.  Abdominal:     General: Bowel sounds are normal. There is no distension.     Palpations: Abdomen is soft.     Tenderness: There is no abdominal tenderness.  Skin:    General: Skin is warm and dry. Psychiatric:     Comments: appropriate, cooperative Neurological:     Comments: Patient is alert and oriented x 3, follows commands, moving all 4 extremities to gravity   PRIOR EXAMS: Musculoskeletal:  Mild TTP in periscapular muscles bilaterally, worse around the rhomboids.  She is using Theracane with benefit Musculoskeletal:     Cervical back: Neck supple.     Comments: Ue's 5/5 throughout B/L RLE 5-/5 throughout- esp HF LLE- hard to test with NWB throughout, but at least 3/5- and EHL 4/5- can lift against gravity except NT on L ankle due to cast/ACE wrap  Neurological:     Comments: Patient is alert and oriented x 3 following commands and cooperative with exam Very mild numbness/tingling in L toes, but wiggles with no difficulty- are warm Ox3- following commands      Assessment/Plan: 1. Functional deficits which require 3+ hours per day of interdisciplinary therapy in a comprehensive inpatient rehab setting. Physiatrist is providing close team supervision and 24 hour management of active medical problems listed below. Physiatrist and rehab team continue to assess barriers to discharge/monitor patient progress toward functional and medical goals  Care Tool:  Bathing    Body parts bathed by patient: Right arm, Left arm, Chest, Abdomen, Right upper leg, Left upper leg, Face, Front perineal area, Buttocks, Right lower leg   Body parts bathed by helper: Front perineal area, Buttocks, Right lower leg, Left lower leg Body parts n/a: Left lower leg   Bathing assist Assist Level: Set up assist     Upper Body Dressing/Undressing Upper body dressing   What is the patient wearing?: Pull over shirt    Upper body assist Assist Level: Set up  assist    Lower Body Dressing/Undressing Lower body dressing      What is the patient wearing?: Underwear/pull up, Pants     Lower body assist Assist for lower body dressing: Contact Guard/Touching assist     Toileting Toileting    Toileting assist Assist for toileting: Contact Guard/Touching assist     Transfers Chair/bed transfer  Transfers assist  Chair/bed transfer activity did not occur: Safety/medical concerns  Chair/bed transfer assist level: Independent with assistive device Chair/bed transfer assistive device: Geologist, Engineering   Ambulation assist   Ambulation activity did not occur: Safety/medical concerns  Assist level: Supervision/Verbal cueing Assistive device: Walker-rolling Max distance: 40   Walk 10 feet activity   Assist  Walk 10 feet activity did not occur: Safety/medical concerns  Assist level: Supervision/Verbal cueing Assistive device: Walker-rolling   Walk 50 feet activity   Assist Walk 50 feet with 2 turns activity did not occur: Safety/medical concerns (fatigue, weakness, pain)         Walk 150 feet activity   Assist Walk 150 feet activity did not occur: Safety/medical concerns (fatigue, weakness, pain)         Walk 10 feet on uneven surface  activity   Assist Walk 10 feet on uneven surfaces activity did not occur: Safety/medical concerns (fatigue, weakness, pain)         Wheelchair     Assist Is the patient using a wheelchair?: Yes Type of Wheelchair: Manual    Wheelchair assist level: Independent Max wheelchair distance: 575ft    Wheelchair 50 feet with 2 turns activity    Assist        Assist Level: Independent   Wheelchair 150 feet activity     Assist      Assist Level: Independent   Blood pressure 125/65, pulse 91, temperature 98.5 F (36.9 C), temperature source Oral, resp. rate 18, height 5' 4 (1.626 m), weight 101.7 kg, last menstrual period 05/20/2024, SpO2  99%.  Medical Problem List and Plan: 1. Functional deficits secondary to multitrauma after jumping from a third level building/suspect suicide attempt 05/27/2024             -patient may  shower- cover LLE             -ELOS/Goals: 10-12 days- supervision- NWB LLE  -Continue CIR, team conference tomorrow  -Stairs at home is a limitation  -  Exp Discharge 1/6 - Patient denies SI.  Seen by neuropsychology yesterday.  Does not appear to be high risk of self-harm.  - Grounds pass ordered  2.  Antithrombotics: -DVT/anticoagulation:  Pharmaceutical: Lovenox  30mg  BID.  Check vascular study. Transition to Eliquis  versus Xarelto  at discharge x 30 days             -antiplatelet therapy: N/A  3. Pain Management: Neurontin  100 mg 3 times daily, Robaxin  as needed as well as oxycodone  5 to 10 mg every 4 hours as needed- will add Lidoderm  3 patches 12 hrs on;12 hrs off- pain controlled  -12/30 increase oxycodone  frequency to q3h, increase neurontin  to 200mg  TID, kpad to upper back  -12/31 pain improved continue current regimen -1/2 increase Robaxin  to 750 mg dose, she reports this helps her pain.  Could consider trigger point injections however she would like to hold off for now.  Heating pad. -06/16/24 advised bringing up trigger point injections for shoulder pain  4. Mood/Behavior/Sleep: Cymbalta  20 mg daily, melatonin 5 mg nightly and (psychiatry follow-up)- Seen by neuropsychology appreciate insight             -antipsychotic agents: N/A 5. Neuropsych/cognition: This patient is capable of making decisions on her own behalf. 6. Skin/Wound Care: Routine skin checks 7. Fluids/Electrolytes/Nutrition: Routine in and outs with follow-up chemistries, cont vitamins/supplements.   8.  Left rib fractures 3/5-7.  Pain control conservative care  9.  Left open tib-fib fracture.  Status post ORIF left acetabulum, external fixator left ankle 12/16, closed reduction and washout with VAC placement 12/17.  Status post  ORIF left tibia fibula and left malleolus fracture 12/19.  Nonweightbearing.  Follow-up Dr. Kendal  10.  Posterior left hip dislocation with associated acetabular fracture.  Status post ORIF 12/17.  11.  Acute blood loss anemia.   1/1 hemoglobin stable at 8.6, continue to monitor  12.  Hypertension.  Amlodipine  5 mg daily.  Monitor with increased mobility  -1/2-4 controlled overall, continue to monitor Vitals:   06/12/24 0524 06/12/24 1648 06/12/24 2032 06/13/24 0522  BP: 131/89 (!) 130/90 136/77 (!) 131/98   06/13/24 1358 06/13/24 2117 06/14/24 0552 06/14/24 1253  BP: 131/85 128/74 129/79 (!) 121/96   06/14/24 1938 06/15/24 0455 06/15/24 2005 06/16/24 0606  BP: 117/74 117/72 136/77 125/65    13.  Constipation.  Colace 100 mg twice daily, MiraLAX  twice daily.  -06/16/24 LBM yesterday per pt but not documented, monitor  14.  Diabetes mellitus.  Hemoglobin A1c 7.7.  Currently on SSI. Needs additional meds long term. Consider Metformin ?  -12/30 start metformin  500mg  daily -12/31 pt reports prior GI issues after metformin , added to allergy list, although she had his AM and reports feels OK in afternoon. Will change to glipizide  5mg  starting tomorrow -06/15/24 CBGs elevated last night and this morning, but just started glipizide  1/1, will add nightly SSI. Continue to monitor -06/16/24 again elevated at night but did it at almost 1am... unclear why timing of this check was so late or what foods she may have consumed. LONG discussion regarding carb intake/importance of protein/etc. Might help if diabetic coordinator came this week to reinforce diet modifications as she is very motivated to keep her A1C down. CBG (last 3)  Recent Labs    06/15/24 1634 06/16/24 0038 06/16/24 0628  GLUCAP 167* 223* 236*    15. Elevated LFTs -12/29 decrease tylenol  dose, monitor  -1/2 labs Not completed yesterday although appears she had an order for it?  Ordered  for today  16. Obesity class 2. Dietary  education  Body mass index is 38.49 kg/m.   I spent >29mins performing patient care related activities, including prolonged face to face time, documentation time, med and CBG review, discussion of meds/diabetes with patient, and overall coordination of care.   LOS: 8 days A FACE TO FACE EVALUATION WAS PERFORMED  [address] 06/16/2024, 11:09 AM     "

## 2024-06-17 ENCOUNTER — Inpatient Hospital Stay (HOSPITAL_COMMUNITY)

## 2024-06-17 ENCOUNTER — Other Ambulatory Visit (HOSPITAL_COMMUNITY): Payer: Self-pay

## 2024-06-17 LAB — CBC
HCT: 27.7 % — ABNORMAL LOW (ref 36.0–46.0)
Hemoglobin: 8.7 g/dL — ABNORMAL LOW (ref 12.0–15.0)
MCH: 26.4 pg (ref 26.0–34.0)
MCHC: 31.4 g/dL (ref 30.0–36.0)
MCV: 83.9 fL (ref 80.0–100.0)
Platelets: 454 K/uL — ABNORMAL HIGH (ref 150–400)
RBC: 3.3 MIL/uL — ABNORMAL LOW (ref 3.87–5.11)
RDW: 14.7 % (ref 11.5–15.5)
WBC: 5.6 K/uL (ref 4.0–10.5)
nRBC: 0 % (ref 0.0–0.2)

## 2024-06-17 LAB — COMPREHENSIVE METABOLIC PANEL WITH GFR
ALT: 32 U/L (ref 0–44)
AST: 31 U/L (ref 15–41)
Albumin: 3.7 g/dL (ref 3.5–5.0)
Alkaline Phosphatase: 80 U/L (ref 38–126)
Anion gap: 8 (ref 5–15)
BUN: 8 mg/dL (ref 6–20)
CO2: 27 mmol/L (ref 22–32)
Calcium: 9.5 mg/dL (ref 8.9–10.3)
Chloride: 101 mmol/L (ref 98–111)
Creatinine, Ser: 0.5 mg/dL (ref 0.44–1.00)
GFR, Estimated: >60 mL/min
Glucose, Bld: 166 mg/dL — ABNORMAL HIGH (ref 70–99)
Potassium: 4.8 mmol/L (ref 3.5–5.1)
Sodium: 136 mmol/L (ref 135–145)
Total Bilirubin: 0.5 mg/dL (ref 0.0–1.2)
Total Protein: 6.9 g/dL (ref 6.5–8.1)

## 2024-06-17 LAB — GLUCOSE, CAPILLARY
Glucose-Capillary: 166 mg/dL — ABNORMAL HIGH (ref 70–99)
Glucose-Capillary: 123 mg/dL — ABNORMAL HIGH (ref 70–99)
Glucose-Capillary: 142 mg/dL — ABNORMAL HIGH (ref 70–99)
Glucose-Capillary: 209 mg/dL — ABNORMAL HIGH (ref 70–99)

## 2024-06-17 MED ORDER — METHOCARBAMOL 500 MG PO TABS
1000.0000 mg | ORAL_TABLET | Freq: Four times a day (QID) | ORAL | Status: DC | PRN
  Administered 2024-06-17: 1000 mg via ORAL
  Filled 2024-06-17: qty 2

## 2024-06-17 MED ORDER — METHOCARBAMOL 750 MG PO TABS
750.0000 mg | ORAL_TABLET | Freq: Four times a day (QID) | ORAL | 0 refills | Status: DC | PRN
  Filled 2024-06-17: qty 90, 23d supply, fill #0

## 2024-06-17 MED ORDER — LIDOCAINE 5 % EX PTCH
3.0000 | MEDICATED_PATCH | CUTANEOUS | 0 refills | Status: AC
  Filled 2024-06-17: qty 30, 10d supply, fill #0

## 2024-06-17 MED ORDER — GABAPENTIN 100 MG PO CAPS
200.0000 mg | ORAL_CAPSULE | Freq: Three times a day (TID) | ORAL | 0 refills | Status: AC
  Filled 2024-06-17: qty 90, 15d supply, fill #0

## 2024-06-17 MED ORDER — APIXABAN 2.5 MG PO TABS
2.5000 mg | ORAL_TABLET | Freq: Two times a day (BID) | ORAL | 0 refills | Status: AC
  Filled 2024-06-17: qty 60, 30d supply, fill #0

## 2024-06-17 MED ORDER — VITAMIN D (ERGOCALCIFEROL) 1.25 MG (50000 UNIT) PO CAPS
50000.0000 [IU] | ORAL_CAPSULE | ORAL | 0 refills | Status: AC
  Filled 2024-06-17: qty 5, 35d supply, fill #0

## 2024-06-17 MED ORDER — AMLODIPINE BESYLATE 5 MG PO TABS
5.0000 mg | ORAL_TABLET | Freq: Every day | ORAL | 0 refills | Status: DC
  Filled 2024-06-17: qty 30, 30d supply, fill #0

## 2024-06-17 MED ORDER — VITAMIN D 25 MCG (1000 UNIT) PO TABS
1000.0000 [IU] | ORAL_TABLET | Freq: Every day | ORAL | Status: DC
  Administered 2024-06-17 – 2024-06-18 (×2): 1000 [IU] via ORAL
  Filled 2024-06-17 (×2): qty 1

## 2024-06-17 MED ORDER — GLIPIZIDE 5 MG PO TABS
5.0000 mg | ORAL_TABLET | Freq: Every day | ORAL | 0 refills | Status: DC
  Filled 2024-06-17: qty 30, 30d supply, fill #0

## 2024-06-17 MED ORDER — MELATONIN 5 MG PO TABS
5.0000 mg | ORAL_TABLET | Freq: Every day | ORAL | 0 refills | Status: AC
  Filled 2024-06-17: qty 30, 30d supply, fill #0

## 2024-06-17 MED ORDER — OXYCODONE HCL 5 MG PO TABS
5.0000 mg | ORAL_TABLET | ORAL | 0 refills | Status: AC | PRN
  Filled 2024-06-17: qty 30, 2d supply, fill #0

## 2024-06-17 MED ORDER — DULOXETINE HCL 20 MG PO CPEP
20.0000 mg | ORAL_CAPSULE | Freq: Every day | ORAL | 0 refills | Status: AC
  Filled 2024-06-17: qty 30, 30d supply, fill #0

## 2024-06-17 MED ORDER — RIVAROXABAN 10 MG PO TABS
10.0000 mg | ORAL_TABLET | Freq: Every day | ORAL | 0 refills | Status: DC
  Filled 2024-06-17: qty 30, 30d supply, fill #0

## 2024-06-17 NOTE — Progress Notes (Signed)
 Physical Therapy Session Note  Patient Details  Name: Caitlyn [name] MRN: [id number] Date of Birth: 1980-06-25  Today's Date: 06/17/2024 PT Individual Time: 1303-1350 PT Individual Time Calculation (min): 47 min  and Today's Date: 06/17/2024 PT Missed Time: 13 Minutes Missed Time Reason: CT/MRI  Short Term Goals: Week 1:  PT Short Term Goal 1 (Week 1): Pt will be MI with supine to sit to supine transfer PT Short Term Goal 2 (Week 1): Pt will be MI with squat pivot transfer to/from wheelchair to bed/mat table PT Short Term Goal 3 (Week 1): Pt will be able to perform wheelchair mobility > 300 ft with MI without rest PT Short Term Goal 4 (Week 1): Pt will be able to stand x 2 min LLE NWB to assist with functional RLE strengthening   Skilled Therapeutic Interventions/Progress Updates:  Patient seated upright in w/c on entrance to room. Patient alert and agreeable to PT session.   Patient with no pain complaint at start of session. Premedicated.  Wheelchair Mobility:  Pt has personal w/c that has been delivered to room. Relates comfort and no desire for adjustments. Pt propelled wheelchair ~150 feet with supervision to hallway outside of main therapy gym.  Pt educated in space occupied while seated in w/c with focus to pivot point of w/c over axles of rear wheels and understanding of need to maneuver with focus to legs elevated in front of pt and shorter distance behind pt.   Guided in w/c mobility training with forward maneuvering slalom of 6 cones placed 5 ft apart out and back. Pt is able to improve from need for consistent vc with intermittent MinA for BUE manuevering for clearance to minimal cues for technique but consistent cues for position of cones and  overall supervision.  Education and practice of spin-in-place in order to more effectively complete tighter turns when necessary. Requires MinA initially and then able to complete with supervision in each direction.   Then guided in  backward maneuver of cones requiring intermittent MinA with vc for correction in technique. Educated pt in potential need for backward maneuvering technique in order to maneuver out of tighter spaces in home such as between bed and dresser, in/ out of bathrooms, and around tables. Pt demos understanding.   Entered ADL apartment to maneuver around bed with w/c and then practice w/c to bed transfers when pt received phone call from husband relating transport in room to collect pt for MRI of shoulder. Transported pt out of ADL apartment and back toward room with handoff to transport in w/c for MRI. Husband present and heading with pt to MRI.    Therapy Documentation Precautions:  Precautions Precautions: Fall, Other (comment) Recall of Precautions/Restrictions: Intact Precaution/Restrictions Comments: rib fxs, LLE NWB; L hip/pelvic fx Required Braces or Orthoses: Splint/Cast Splint/Cast: L lower leg Restrictions Weight Bearing Restrictions Per Provider Order: Yes LLE Weight Bearing Per Provider Order: Non weight bearing General: PT Amount of Missed Time (min): 18 Minutes PT Missed Treatment Reason: CT/MRI  Pain:  No pain related this session. Has been premedicated and received gabapentin  at end of session, just prior to MRI.   Therapy/Group: Individual Therapy   Mliss DELENA Milliner PT, DPT, CSRS 06/17/2024, 3:56 PM

## 2024-06-17 NOTE — Progress Notes (Signed)
 Physical Therapy Session Note  Patient Details  Name: Caitlyn [name] MRN: [id number] Date of Birth: 11/29/1980  Today's Date: 06/17/2024 PT Individual Time: 0945-1100 PT Individual Time Calculation (min): 75 min   Short Term Goals: Week 1:  PT Short Term Goal 1 (Week 1): Pt will be MI with supine to sit to supine transfer PT Short Term Goal 2 (Week 1): Pt will be MI with squat pivot transfer to/from wheelchair to bed/mat table PT Short Term Goal 3 (Week 1): Pt will be able to perform wheelchair mobility > 300 ft with MI without rest PT Short Term Goal 4 (Week 1): Pt will be able to stand x 2 min LLE NWB to assist with functional RLE strengthening   Skilled Therapeutic Interventions/Progress Updates:      Pt sitting in her wheelchair to start - her husband present throughout session. Pt denies any resting pain. She's aware of DC plan, DME recommendations, and follow up care. Pt defer's any stair training to allow her body to rest in order to prepare for tomorrow. She and her husband report feeling confident in their ability to navigate the steps to enter their apartment.   Husband assisted with transporting patient at w/c level to ortho gym to review car transfers. Car height was set to simulate their personal vehicle. She completed an ambulatory (hop) with the RW transfer into and out of the car. Completed without assist or cues, her husband assisting with RW management.   She then completed seated UE Nustep Ergometer for hill (double) training with auto load adjustable resistance. She completed x15 minutes - covered 876 revolutions, 2.8 METs, and 1.0 miles.   Next, reviewed curb transfers at w/c level for community reintegration with her husband, Darold. Demonstrated technique for Darold to dependently transfer up/down a curb at w/c level. Reinforced education via teachback method and Darold demonstrated ability to safely bump her up/down a curb (5) if needed.   Pt's DME delivered to  room during session. Spent time ensuring husband and patient understood basic features (folding, unfolding) of the wheelchair. Also assisted patient to the wheelchair to assess fit which it was. Adjusted the elevating leg rests to fit. Pt deferred using the firm back as it pushed her too far forwards to edge of chair.   Pt ended session with needs met.    Therapy Documentation Precautions:  Precautions Precautions: Fall, Other (comment) Recall of Precautions/Restrictions: Intact Precaution/Restrictions Comments: rib fxs, LLE NWB; L hip/pelvic fx Required Braces or Orthoses: Splint/Cast Splint/Cast: L lower leg Restrictions Weight Bearing Restrictions Per Provider Order: Yes LLE Weight Bearing Per Provider Order: Non weight bearing General:     Therapy/Group: Individual Therapy  Caitlyn [name] 06/17/2024, 7:55 AM

## 2024-06-17 NOTE — Plan of Care (Signed)
" °  Problem: RH SAFETY Goal: RH STG ADHERE TO SAFETY PRECAUTIONS W/ASSISTANCE/DEVICE Description: STG Adhere to Safety Precautions With mod-I Assistance/Device. Outcome: Progressing Goal: RH STG DECREASED RISK OF FALL WITH ASSISTANCE Description: STG Decreased Risk of Fall With cues and reminders Outcome: Progressing   Problem: RH PAIN MANAGEMENT Goal: RH STG PAIN MANAGED AT OR BELOW PT'S PAIN GOAL Description: Pain level at or below 5/10 Outcome: Progressing   Problem: RH KNOWLEDGE DEFICIT GENERAL Goal: RH STG INCREASE KNOWLEDGE OF SELF CARE AFTER HOSPITALIZATION Description: Pt will be able to demonstrate understanding of lifestyle modifications and pain management using medications and non-pharmacological methods independently.  Outcome: Progressing   Problem: Education: Goal: Verbalization of understanding the information provided (i.e., activity precautions, restrictions, etc) will improve Outcome: Progressing   Problem: Activity: Goal: Ability to ambulate and perform ADLs will improve Outcome: Progressing   Problem: Clinical Measurements: Goal: Postoperative complications will be avoided or minimized Outcome: Progressing   Problem: Self-Concept: Goal: Ability to maintain and perform role responsibilities to the fullest extent possible will improve Outcome: Progressing   Problem: Pain Management: Goal: Pain level will decrease Outcome: Progressing   Problem: Coping: Goal: Ability to adjust to condition or change in health will improve Outcome: Progressing   Problem: Metabolic: Goal: Ability to maintain appropriate glucose levels will improve Outcome: Progressing   Problem: Nutritional: Goal: Maintenance of adequate nutrition will improve Outcome: Progressing   "

## 2024-06-17 NOTE — Progress Notes (Signed)
 Occupational Therapy Discharge Summary  Patient Details  Name: Caitlyn [name] MRN: [id number] Date of Birth: 1981-04-29  Date of Discharge from OT service:June 18, 2023   Patient has met 12 of 12 long term goals due to improved activity tolerance, improved balance, ability to compensate for deficits, improved awareness, and improved coordination.  Patient to discharge at overall Supervision level.  Patient's care partner is independent to provide the necessary physical assistance at discharge.    All goals met  Recommendation:  No follow up at this time  Equipment: No equipment provided  Reasons for discharge: treatment goals met  Patient/family agrees with progress made and goals achieved: Yes  OT Discharge Precautions/Restrictions  Precautions Precautions: Fall;Other (comment) Precaution/Restrictions Comments: rib fxs, LLE NWB; L hip/pelvic fx Required Braces or Orthoses: Splint/Cast Splint/Cast: L lower leg Restrictions Weight Bearing Restrictions Per Provider Order: Yes LLE Weight Bearing Per Provider Order: Non weight bearing ADL ADL Equipment Provided: Reacher Eating: Independent Where Assessed-Eating: Wheelchair Grooming: Independent Where Assessed-Grooming: Sitting at sink Upper Body Bathing: Modified independent Where Assessed-Upper Body Bathing: Shower Lower Body Bathing: Setup Where Assessed-Lower Body Bathing: Shower Upper Body Dressing: Modified independent (Device) Where Assessed-Upper Body Dressing: Wheelchair Lower Body Dressing: Supervision/safety Where Assessed-Lower Body Dressing: Sitting at sink, Standing at sink Toileting: Supervision/safety Where Assessed-Toileting: Neurosurgeon Method: Surveyor, Minerals: Raised toilet seat, Grab bars Tub/Shower Transfer: Close supervison Web Designer Method: Engineer, Technical Sales: Adult Nurse: Close supervision Film/video Editor Method: Warden/ranger: Emergency planning/management officer, Grab bars Vision Baseline Vision/History: 0 No visual deficits Patient Visual Report: No change from baseline Vision Assessment?: No apparent visual deficits Perception  Perception: Within Functional Limits Praxis Praxis: WFL Cognition Cognition Overall Cognitive Status: Within Functional Limits for tasks assessed Arousal/Alertness: Awake/alert Orientation Level: Person;Place;Situation Person: Oriented Place: Oriented Situation: Oriented Memory: Appears intact Awareness: Appears intact Problem Solving: Appears intact Safety/Judgment: Appears intact Comments: anxious Brief Interview for Mental Status (BIMS) Repetition of Three Words (First Attempt): 3 Temporal Orientation: Year: Correct Temporal Orientation: Month: Accurate within 5 days Temporal Orientation: Day: Correct Recall: Sock: Yes, no cue required Recall: Blue: Yes, no cue required Recall: Bed: Yes, no cue required BIMS Summary Score: 15 Sensation Sensation Light Touch: Impaired Detail Light Touch Impaired Details: Impaired LLE Hot/Cold: Not tested Proprioception: Appears Intact Additional Comments: numbness along L great toe Coordination Gross Motor Movements are Fluid and Coordinated: No Fine Motor Movements are Fluid and Coordinated: Yes Coordination and Movement Description: altered balance strategies due to LLE NWB precautions and pain Motor  Motor Motor: Abnormal postural alignment and control Motor - Skilled Clinical Observations: altered balance strategies due to LLE NWB precautions and pain Mobility  Bed Mobility Bed Mobility: Rolling Right;Rolling Left;Supine to Sit;Sit to Supine Rolling Right: Independent with assistive device Rolling Left: Independent with assistive device Supine to Sit: Independent with assistive device Sit to Supine: Independent with assistive  device Transfers Sit to Stand: Independent with assistive device Stand to Sit: Independent with assistive device  Trunk/Postural Assessment  Cervical Assessment Cervical Assessment: Within Functional Limits Thoracic Assessment Thoracic Assessment: Within Functional Limits Lumbar Assessment Lumbar Assessment: Within Functional Limits Postural Control Postural Control: Deficits on evaluation Righting Reactions: slightly delayed Protective Responses: slightly delayed  Balance Balance Balance Assessed: Yes Static Sitting Balance Static Sitting - Balance Support: Feet supported;Bilateral upper extremity supported Static Sitting - Level of Assistance: 7: Independent Dynamic Sitting Balance Dynamic Sitting - Balance  Support: Feet unsupported;No upper extremity supported Dynamic Sitting - Level of Assistance: 6: Modified independent (Device/Increase time) Static Standing Balance Static Standing - Balance Support: Bilateral upper extremity supported;During functional activity (RW) Static Standing - Level of Assistance: 6: Modified independent (Device/Increase time) Dynamic Standing Balance Dynamic Standing - Balance Support: Bilateral upper extremity supported;During functional activity (RW) Dynamic Standing - Level of Assistance: 6: Modified independent (Device/Increase time) Dynamic Standing - Comments: with transfers Extremity/Trunk Assessment RUE Assessment RUE Assessment: Within Functional Limits LUE Assessment LUE Assessment: Exceptions to [facility] Active Range of Motion (AROM) Comments: WFL General Strength Comments: grossly limited by pain from L rib fractures   [name], MS, OTR/L  06/17/2024, 12:22 PM

## 2024-06-17 NOTE — Progress Notes (Signed)
 Occupational Therapy Session Note  Patient Details  Name: KEYONIA GLUTH MRN: [id number] Date of Birth: [birth year]  Today's Date: 06/17/2024 OT Individual Time: 9167-9069 OT Individual Time Calculation (min): 58 min    Short Term Goals: Week 1:  OT Short Term Goal 1 (Week 1): Pt will perform toilet transfer with CGA consistently OT Short Term Goal 2 (Week 1): Pt will perform LB dress with MIN A using AE PRN OT Short Term Goal 3 (Week 1): Pt will perform clothing management for toileting with MIN A  Skilled Therapeutic Interventions/Progress Updates:  Skilled OT intervention completed with focus on ADL retraining and tub/shower transfers. Pt received seated EOB, agreeable to session. No pain reported.  Completed LB dressing using reacher including donning of R shoe with supervision at the sit > stand level using RW. Pt completed all stand pivot transfers using RW with supervision and short distance step hop transfers with CGA with RW during session.  Transferred > w/c. Seated oral hygiene with mod I at sink. Transported dependently in w/c <> ADL apartment.  Pt reports tub/shower in her bathroom. Demo offered on use of TTB in tub shower. Educated on how to install it properly, shower curtain tuck method and recommended seated bathing for safety. Discussed suction cup holder for hand held shower head to eliminate the demand of standing for fall prevention. Pt able to return demo transfer to TTB with supervision. Cueing/education needed on use of gait belt for leg lifter for LLE due to weight of cast to increase independence of pivoting <> tub on bench.   Discussed functional bed mobility with standard bed. Pt sleeps on L side and required gait belt again for lifting LLE into bed. Pt reports her bed is higher than practice apartment bed and we discussed potential bed rail for increased independence as pt was able to sit <> supine with supervision but endorsed great difficulty.  Pt remained seated  in w/c, with husband present and with all needs in reach at end of session.    Therapy Documentation Precautions:  Precautions Precautions: Fall, Other (comment) Recall of Precautions/Restrictions: Intact Precaution/Restrictions Comments: rib fxs, LLE NWB; L hip/pelvic fx Required Braces or Orthoses: Splint/Cast Splint/Cast: L lower leg Restrictions Weight Bearing Restrictions Per Provider Order: Yes LLE Weight Bearing Per Provider Order: Weight bearing as tolerated    Therapy/Group: Individual Therapy  [name], MS, OTR/L  06/17/2024, 9:51 AM

## 2024-06-17 NOTE — Plan of Care (Signed)
  Problem: RH Balance Goal: LTG: Patient will maintain dynamic sitting balance (OT) Description: LTG:  Patient will maintain dynamic sitting balance with assistance during activities of daily living (OT) Outcome: Completed/Met Goal: LTG Patient will maintain dynamic standing with ADLs (OT) Description: LTG:  Patient will maintain dynamic standing balance with assist during activities of daily living (OT)  Outcome: Completed/Met   Problem: Sit to Stand Goal: LTG:  Patient will perform sit to stand in prep for activites of daily living with assistance level (OT) Description: LTG:  Patient will perform sit to stand in prep for activites of daily living with assistance level (OT) Outcome: Completed/Met   Problem: RH Eating Goal: LTG Patient will perform eating w/assist, cues/equip (OT) Description: LTG: Patient will perform eating with assist, with/without cues using equipment (OT) Outcome: Completed/Met   Problem: RH Grooming Goal: LTG Patient will perform grooming w/assist,cues/equip (OT) Description: LTG: Patient will perform grooming with assist, with/without cues using equipment (OT) Outcome: Completed/Met   Problem: RH Bathing Goal: LTG Patient will bathe all body parts with assist levels (OT) Description: LTG: Patient will bathe all body parts with assist levels (OT) Outcome: Completed/Met   Problem: RH Dressing Goal: LTG Patient will perform upper body dressing (OT) Description: LTG Patient will perform upper body dressing with assist, with/without cues (OT). Outcome: Completed/Met Goal: LTG Patient will perform lower body dressing w/assist (OT) Description: LTG: Patient will perform lower body dressing with assist, with/without cues in positioning using equipment (OT) Outcome: Completed/Met   Problem: RH Toileting Goal: LTG Patient will perform toileting task (3/3 steps) with assistance level (OT) Description: LTG: Patient will perform toileting task (3/3 steps) with  assistance level (OT)  Outcome: Completed/Met   Problem: RH Simple Meal Prep Goal: LTG Patient will perform simple meal prep w/assist (OT) Description: LTG: Patient will perform simple meal prep with assistance, with/without cues (OT). Outcome: Completed/Met   Problem: RH Toilet Transfers Goal: LTG Patient will perform toilet transfers w/assist (OT) Description: LTG: Patient will perform toilet transfers with assist, with/without cues using equipment (OT) Outcome: Completed/Met   Problem: RH Tub/Shower Transfers Goal: LTG Patient will perform tub/shower transfers w/assist (OT) Description: LTG: Patient will perform tub/shower transfers with assist, with/without cues using equipment (OT) Outcome: Completed/Met

## 2024-06-17 NOTE — Progress Notes (Signed)
 Inpatient Rehabilitation Discharge Medication Review by a Pharmacist  A complete drug regimen review was completed for this patient to identify any potential clinically significant medication issues.  High Risk Drug Classes Is patient taking? Indication by Medication  Antipsychotic No   Anticoagulant Yes Apixaban  - post op VTE ppx  Antibiotic No   Opioid Yes Oxycodone  - pain  Antiplatelet No   Hypoglycemics/insulin  Yes Glipizide  - DM  Vasoactive Medication Yes Amlodipine  - HTN  Chemotherapy No   Other Yes APAP - pain Duloxetine  - mood Gabapentin  - pain Lidocaine  patch - pain Melatonin - sleep Methocarbamol  - spasm Miralax  - bowel regimen Vitamin D  - supplement Colace - bowel regimen     Type of Medication Issue Identified Description of Issue Recommendation(s)  Drug Interaction(s) (clinically significant)     Duplicate Therapy     Allergy     No Medication Administration End Date     Incorrect Dose     Additional Drug Therapy Needed     Significant med changes from prior encounter (inform family/care partners about these prior to discharge).    Other       Clinically significant medication issues were identified that warrant physician communication and completion of prescribed/recommended actions by midnight of the next day:  No  Name of provider notified for urgent issues identified:   Provider Method of Notification:     Pharmacist comments:   Time spent performing this drug regimen review (minutes):  15   Evani Shrider, Pharm.D., BCPS Clinical Pharmacist Clinical phone for 06/17/2024 from 7:30-3:00 is [phone].  **Pharmacist phone directory can be found on [email or web address] listed under [facility].  06/17/2024 1:03 PM

## 2024-06-17 NOTE — Progress Notes (Signed)
 "                                                        PROGRESS NOTE   Subjective/Complaints: Complains of continued right shoulder pain- she asks about injection, discussed that location of pain appears more muscular than related to shoulder joint  ROS: as per HPI. Denies fevers, CP, SOB, abd pain, N/V/D/C, or any other complaints at this time.   + Left hip pain-continued, overall controlled with medication + R Periscapular muscle soreness - Denies any SI or HI  Objective:   No results found.  Recent Labs    06/17/24 0611  WBC 5.6  HGB 8.7*  HCT 27.7*  PLT 454*    Recent Labs    06/14/24 1405 06/17/24 0611  NA 138 136  K 4.4 4.8  CL 101 101  CO2 28 27  GLUCOSE 124* 166*  BUN 7 8  CREATININE 0.54 0.50  CALCIUM  9.8 9.5       Intake/Output Summary (Last 24 hours) at 06/17/2024 1047 Last data filed at 06/17/2024 0743 Gross per 24 hour  Intake 929 ml  Output --  Net 929 ml         Physical Exam: Vital Signs Blood pressure 131/84, pulse 84, temperature 98.6 F (37 C), resp. rate 16, height 5' 4 (1.626 m), weight 101.7 kg, last menstrual period 05/20/2024, SpO2 100%.  Gen: no distress, normal appearing, working with therapy in simulation room HEENT: oral mucosa pink and moist, NCAT Cardio: Reg rate Chest: normal effort, normal rate of breathing Abd: soft, non-distended Ext: no edema Psych: pleasant, normal affect Skin: intact MSK: TTP in right scapular region LLE is NWB Upper extremities and RLE have 5/5 strength Neuro: AOx3     Assessment/Plan: 1. Functional deficits which require 3+ hours per day of interdisciplinary therapy in a comprehensive inpatient rehab setting. Physiatrist is providing close team supervision and 24 hour management of active medical problems listed below. Physiatrist and rehab team continue to assess barriers to discharge/monitor patient progress toward functional and medical goals  Care Tool:  Bathing    Body parts  bathed by patient: Right arm, Left arm, Chest, Abdomen, Right upper leg, Left upper leg, Face, Front perineal area, Buttocks, Right lower leg   Body parts bathed by helper: Front perineal area, Buttocks, Right lower leg, Left lower leg Body parts n/a: Left lower leg   Bathing assist Assist Level: Set up assist     Upper Body Dressing/Undressing Upper body dressing   What is the patient wearing?: Pull over shirt    Upper body assist Assist Level: Independent with assistive device    Lower Body Dressing/Undressing Lower body dressing      What is the patient wearing?: Underwear/pull up, Pants     Lower body assist Assist for lower body dressing: Supervision/Verbal cueing     Toileting Toileting    Toileting assist Assist for toileting: Supervision/Verbal cueing     Transfers Chair/bed transfer  Transfers assist  Chair/bed transfer activity did not occur: Safety/medical concerns  Chair/bed transfer assist level: Independent with assistive device Chair/bed transfer assistive device: Walker, Archivist   Ambulation assist   Ambulation activity did not occur: Safety/medical concerns  Assist level: Supervision/Verbal cueing Assistive device: Walker-rolling Max distance: 40   Walk 10  feet activity   Assist  Walk 10 feet activity did not occur: Safety/medical concerns  Assist level: Supervision/Verbal cueing Assistive device: Walker-rolling   Walk 50 feet activity   Assist Walk 50 feet with 2 turns activity did not occur: Safety/medical concerns         Walk 150 feet activity   Assist Walk 150 feet activity did not occur: Safety/medical concerns         Walk 10 feet on uneven surface  activity   Assist Walk 10 feet on uneven surfaces activity did not occur: Safety/medical concerns         Wheelchair     Assist Is the patient using a wheelchair?: Yes Type of Wheelchair: Manual    Wheelchair assist level:  Independent Max wheelchair distance: 572ft    Wheelchair 50 feet with 2 turns activity    Assist        Assist Level: Independent   Wheelchair 150 feet activity     Assist      Assist Level: Independent   Blood pressure 131/84, pulse 84, temperature 98.6 F (37 C), resp. rate 16, height 5' 4 (1.626 m), weight 101.7 kg, last menstrual period 05/20/2024, SpO2 100%.  Medical Problem List and Plan: 1. Functional deficits secondary to multitrauma after jumping from a third level building/suspect suicide attempt 05/27/2024             -patient may  shower- cover LLE             -ELOS/Goals: 10-12 days- supervision- NWB LLE  -Continue CIR, team conference tomorrow  -Stairs at home is a limitation - Patient denies SI.  Seen by neuropsychology yesterday.  Does not appear to be high risk of self-harm.  - Grounds pass ordered  Discussed d/c tomorrow  2.  Antithrombotics: -DVT/anticoagulation:  Pharmaceutical: Lovenox  30mg  BID.  Check vascular study. Transition to Eliquis  versus Xarelto  at discharge x 30 days             -antiplatelet therapy: N/A  3. Right shoulder pain: MRI ordered. Neurontin  100 mg 3 times daily, Robaxin  as needed as well as oxycodone  5 to 10 mg every 4 hours as needed- will add Lidoderm  3 patches 12 hrs on;12 hrs off- pain controlled  -12/30 increase oxycodone  frequency to q3h, increase neurontin  to 200mg  TID, kpad to upper back  -12/31 pain improved continue current regimen -1/2 increase Robaxin  to 750 mg dose, she reports this helps her pain.  Could consider trigger point injections however she would like to hold off for now.  Heating pad. -06/16/24 advised bringing up trigger point injections for shoulder pain  4. Mood/Behavior/Sleep: Cymbalta  20 mg daily, melatonin 5 mg nightly and (psychiatry follow-up)- Seen by neuropsychology appreciate insight             -antipsychotic agents: N/A 5. Neuropsych/cognition: This patient is capable of making decisions  on her own behalf. 6. Urinary incontinence: UA ordered  7. Fluids/Electrolytes/Nutrition: Routine in and outs with follow-up chemistries, cont vitamins/supplements.   8.  Left rib fractures 3/5-7.  Pain control conservative care  9.  Left open tib-fib fracture.  Status post ORIF left acetabulum, external fixator left ankle 12/16, closed reduction and washout with VAC placement 12/17.  Status post ORIF left tibia fibula and left malleolus fracture 12/19.  Nonweightbearing.  Follow-up Dr. Kendal  10.  Posterior left hip dislocation with associated acetabular fracture.  Status post ORIF 12/17.  11.  Acute blood loss anemia.   1/1 hemoglobin  stable at 8.6, continue to monitor  12.  Hypertension.  Amlodipine  5 mg daily.  Monitor with increased mobility  -1/2-4 controlled overall, continue to monitor Vitals:   06/13/24 0522 06/13/24 1358 06/13/24 2117 06/14/24 0552  BP: (!) 131/98 131/85 128/74 129/79   06/14/24 1253 06/14/24 1938 06/15/24 0455 06/15/24 2005  BP: (!) 121/96 117/74 117/72 136/77   06/16/24 0606 06/16/24 1532 06/16/24 1945 06/17/24 0543  BP: 125/65 (!) 134/94 (!) 130/99 131/84    13.  Constipation.  Colace 100 mg twice daily, MiraLAX  twice daily.  -06/16/24 LBM yesterday per pt but not documented, monitor  14.  Diabetes mellitus.  Hemoglobin A1c 7.7.  Currently on SSI. Needs additional meds long term. Consider Metformin ?  -12/30 start metformin  500mg  daily -12/31 pt reports prior GI issues after metformin , added to allergy list, although she had his AM and reports feels OK in afternoon. Will change to glipizide  5mg  starting tomorrow -06/15/24 CBGs elevated last night and this morning, but just started glipizide  1/1, will add nightly SSI. Continue to monitor -06/16/24 again elevated at night but did it at almost 1am... unclear why timing of this check was so late or what foods she may have consumed. LONG discussion regarding carb intake/importance of protein/etc. Might help if  diabetic coordinator came this week to reinforce diet modifications as she is very motivated to keep her A1C down. CBG (last 3)  Recent Labs    06/16/24 1636 06/16/24 2200 06/17/24 0544  GLUCAP 164* 240* 166*    15. Transaminitis: labs reviewed and this has resolved  16. Obesity class 2. Dietary education  Body mass index is 38.49 kg/m.   LOS: 9 days A FACE TO FACE EVALUATION WAS PERFORMED  Caitlyn [name] Caitlyn [name] 06/17/2024, 10:47 AM     "

## 2024-06-18 ENCOUNTER — Other Ambulatory Visit (HOSPITAL_COMMUNITY): Payer: Self-pay

## 2024-06-18 LAB — GLUCOSE, CAPILLARY: Glucose-Capillary: 160 mg/dL — ABNORMAL HIGH (ref 70–99)

## 2024-06-18 MED ORDER — METHOCARBAMOL 500 MG PO TABS
1000.0000 mg | ORAL_TABLET | Freq: Four times a day (QID) | ORAL | 0 refills | Status: AC | PRN
  Filled 2024-06-18: qty 180, 23d supply, fill #0

## 2024-06-18 NOTE — Progress Notes (Signed)
 Inpatient Rehabilitation Care Coordinator Discharge Note   Patient Details  Name: Caitlyn [name] MRN: [id number] Date of Birth: Jan 07, 1981   Discharge location: Home with spouse and children  Length of Stay: 9 days  Discharge activity level: Supervision/Verbal cueing  Home/community participation: Active in community  Patient response un:Yzjouy Literacy - How often do you need to have someone help you when you read instructions, pamphlets, or other written material from your doctor or pharmacy?: Never  Patient response un:Dnrpjo Isolation - How often do you feel lonely or isolated from those around you?: Never  Services provided included: MD, RD, PT, OT, SLP, RN, CM, Pharmacy, TR, SW  Financial Services:  [employer] Utilized: Private Insurance [employer] offered to/list presented to: Patient/Spouse  Follow-up services arranged:  Outpatient, DME    Outpatient Servicies: [place] [facility] for PT once reached WB status DME : 20x16 wheelchair with bilateral elevating leg rests and 3-1 commode    Patient response to transportation need: Is the patient able to respond to transportation needs?: Yes In the past 12 months, has lack of transportation kept you from medical appointments or from getting medications?: No In the past 12 months, has lack of transportation kept you from meetings, work, or from getting things needed for daily living?: No   Patient/Family verbalized understanding of follow-up arrangements:  Yes  Individual responsible for coordination of the follow-up plan: Patieny/Spouse  Confirmed correct DME delivered: [employer]  Loreli 06/18/2024    Comments (or additional information): Patient's spouse participated in family education and able to address 24/7 care needs. OP PT referral sent to [facility] via fax.   Summary of Stay    Date/Time Discharge Planning CSW  06/11/24 1435 Patient plans to return home with spouse and children. Will  not be able to work due to limitations. Neuropsych to see due to concerns. Will await therapy follow-up recommendations. DS       [employer]  Loreli

## 2024-06-18 NOTE — Progress Notes (Signed)
 Patient ID: Caitlyn [name], female   DOB: [birth year], 44 y.o.   MRN: [id number]  OP PT referral sent to [facility] via fax.

## 2024-06-18 NOTE — Progress Notes (Signed)
 "                                                        PROGRESS NOTE   Subjective/Complaints: Discussed that robaxin  increase was helpful for her shoulder pain, Dan placed order for suture removal today, discussed that I would call her with her MRI results once available  ROS: as per HPI. Denies fevers, CP, SOB, abd pain, N/V/D/C, or any other complaints at this time.   + Left hip pain-continued, overall controlled with medication R Periscapular muscle soreness improved - Denies any SI or HI  Objective:   No results found.  Recent Labs    06/17/24 0611  WBC 5.6  HGB 8.7*  HCT 27.7*  PLT 454*    Recent Labs    06/17/24 0611  NA 136  K 4.8  CL 101  CO2 27  GLUCOSE 166*  BUN 8  CREATININE 0.50  CALCIUM  9.5       Intake/Output Summary (Last 24 hours) at 06/18/2024 0932 Last data filed at 06/17/2024 1813 Gross per 24 hour  Intake 720 ml  Output --  Net 720 ml         Physical Exam: Vital Signs Blood pressure 116/73, pulse 81, temperature 98.7 F (37.1 C), resp. rate 17, height 5' 4 (1.626 m), weight 101.7 kg, last menstrual period 05/20/2024, SpO2 99%.  Gen: no distress, normal appearing, working with therapy in simulation room HEENT: oral mucosa pink and moist, NCAT Cardio: Reg rate Chest: normal effort, normal rate of breathing Abd: soft, non-distended Ext: no edema Psych: pleasant, normal affect Skin: intact MSK: TTP in right scapular region LLE is NWB Upper extremities and RLE have 5/5 strength Neuro: Aox3 ADLS: brushing teeth independently at sink     Assessment/Plan: 1. Functional deficits which require 3+ hours per day of interdisciplinary therapy in a comprehensive inpatient rehab setting. Physiatrist is providing close team supervision and 24 hour management of active medical problems listed below. Physiatrist and rehab team continue to assess barriers to discharge/monitor patient progress toward functional and medical goals  Care  Tool:  Bathing    Body parts bathed by patient: Right arm, Left arm, Chest, Abdomen, Right upper leg, Left upper leg, Face, Front perineal area, Buttocks, Right lower leg   Body parts bathed by helper: Front perineal area, Buttocks, Right lower leg, Left lower leg Body parts n/a: Left lower leg   Bathing assist Assist Level: Set up assist     Upper Body Dressing/Undressing Upper body dressing   What is the patient wearing?: Pull over shirt    Upper body assist Assist Level: Independent with assistive device    Lower Body Dressing/Undressing Lower body dressing      What is the patient wearing?: Underwear/pull up, Pants     Lower body assist Assist for lower body dressing: Supervision/Verbal cueing     Toileting Toileting    Toileting assist Assist for toileting: Supervision/Verbal cueing     Transfers Chair/bed transfer  Transfers assist  Chair/bed transfer activity did not occur: Safety/medical concerns  Chair/bed transfer assist level: Independent with assistive device Chair/bed transfer assistive device: Walker, Archivist   Ambulation assist   Ambulation activity did not occur: Safety/medical concerns  Assist level: Supervision/Verbal cueing Assistive device: Walker-rolling Max distance: 40  Walk 10 feet activity   Assist  Walk 10 feet activity did not occur: Safety/medical concerns  Assist level: Supervision/Verbal cueing Assistive device: Walker-rolling   Walk 50 feet activity   Assist Walk 50 feet with 2 turns activity did not occur: Safety/medical concerns         Walk 150 feet activity   Assist Walk 150 feet activity did not occur: Safety/medical concerns         Walk 10 feet on uneven surface  activity   Assist Walk 10 feet on uneven surfaces activity did not occur: Safety/medical concerns         Wheelchair     Assist Is the patient using a wheelchair?: Yes Type of Wheelchair: Manual     Wheelchair assist level: Independent Max wheelchair distance: 524ft    Wheelchair 50 feet with 2 turns activity    Assist        Assist Level: Independent   Wheelchair 150 feet activity     Assist      Assist Level: Independent   Blood pressure 116/73, pulse 81, temperature 98.7 F (37.1 C), resp. rate 17, height 5' 4 (1.626 m), weight 101.7 kg, last menstrual period 05/20/2024, SpO2 99%.  Medical Problem List and Plan: 1. Functional deficits secondary to multitrauma after jumping from a third level building/suspect suicide attempt 05/27/2024             -patient may  shower- cover LLE             -ELOS/Goals: 10-12 days- supervision- NWB LLE  Discussed plan for d/c today -Transition to Eliquis  for 30 days This patient is capable of making decisions on her own behalf.  2. Right shoulder pain: MRI ordered- discussed I will call with results. Neurontin  100 mg 3 times daily, Robaxin  as needed as well as oxycodone  5 to 10 mg every 4 hours as needed- will add Lidoderm  3 patches 12 hrs on;12 hrs off- pain controlled  -12/30 increase oxycodone  frequency to q3h, increase neurontin  to 200mg  TID, kpad to upper back  -12/31 pain improved continue current regimen -1/2 increase Robaxin  to 750 mg dose, she reports this helps her pain.  Could consider trigger point injections however she would like to hold off for now.  Heating pad. -06/16/24 advised bringing up trigger point injections for shoulder pain  3. Depression: continue Cymbalta  20 mg daily, melatonin 5 mg nightly and (psychiatry follow-up)- Seen by neuropsychology appreciate insight  4. Urinary incontinence: UA ordered- messaged nursing to check if this was ever drawn  7. Fluids/Electrolytes/Nutrition: Routine in and outs with follow-up chemistries, cont vitamins/supplements.   8.  Left rib fractures 3/5-7.  Pain control conservative care  9.  Left open tib-fib fracture.  Status post ORIF left acetabulum, external  fixator left ankle 12/16, closed reduction and washout with VAC placement 12/17.  Status post ORIF left tibia fibula and left malleolus fracture 12/19.  Nonweightbearing.  Follow-up Dr. Kendal  10.  Posterior left hip dislocation with associated acetabular fracture.  Status post ORIF 12/17.  11.  Acute blood loss anemia.   1/1 hemoglobin stable at 8.6, continue to monitor  12.  Hypertension.  Amlodipine  5 mg daily.  Monitor with increased mobility  -1/2-4 controlled overall, continue to monitor Vitals:   06/14/24 1253 06/14/24 1938 06/15/24 0455 06/15/24 2005  BP: (!) 121/96 117/74 117/72 136/77   06/16/24 0606 06/16/24 1532 06/16/24 1945 06/17/24 0543  BP: 125/65 (!) 134/94 (!) 130/99 131/84   06/17/24  1306 06/17/24 2005 06/17/24 2051 06/18/24 0541  BP: (!) 155/88 (!) 133/103 110/64 116/73    13.  Constipation.  Colace 100 mg twice daily, MiraLAX  twice daily.  -06/16/24 LBM yesterday per pt but not documented, monitor  14.  Diabetes mellitus.  Hemoglobin A1c 7.7.  Currently on SSI. Needs additional meds long term. Consider Metformin ?  -12/30 start metformin  500mg  daily -12/31 pt reports prior GI issues after metformin , added to allergy list, although she had his AM and reports feels OK in afternoon. Will change to glipizide  5mg  starting tomorrow -06/15/24 CBGs elevated last night and this morning, but just started glipizide  1/1, will add nightly SSI. Continue to monitor -06/16/24 again elevated at night but did it at almost 1am... unclear why timing of this check was so late or what foods she may have consumed. LONG discussion regarding carb intake/importance of protein/etc. Might help if diabetic coordinator came this week to reinforce diet modifications as she is very motivated to keep her A1C down. CBG (last 3)  Recent Labs    06/17/24 1623 06/17/24 2055 06/18/24 0632  GLUCAP 142* 209* 160*    15. Transaminitis: labs reviewed and this has resolved  16. Obesity class 2. Dietary  education  Body mass index is 38.49 kg/m.   >30 minutes spent in discharge of patient including review of medications and follow-up appointments, physical examination, and in answering all patient's questions   LOS: 10 days A FACE TO FACE EVALUATION WAS PERFORMED  Sven SQUIBB Pike Scantlebury 06/18/2024, 9:32 AM     "

## 2024-06-19 ENCOUNTER — Encounter: Attending: Physical Medicine and Rehabilitation | Admitting: Physical Medicine and Rehabilitation

## 2024-06-19 ENCOUNTER — Encounter (HOSPITAL_COMMUNITY): Payer: Self-pay | Admitting: Student

## 2024-06-19 DIAGNOSIS — M25511 Pain in right shoulder: Secondary | ICD-10-CM | POA: Diagnosis not present

## 2024-06-19 DIAGNOSIS — R32 Unspecified urinary incontinence: Secondary | ICD-10-CM | POA: Diagnosis not present

## 2024-06-19 DIAGNOSIS — G8929 Other chronic pain: Secondary | ICD-10-CM

## 2024-06-19 NOTE — Progress Notes (Signed)
" ° °  Subjective:    Patient ID: Caitlyn [name], female    DOB: October 10, 1980, 44 y.o.   MRN: [id number]  HPI An audio/video tele-health visit is felt to be the most appropriate encounter for this patient at this time. This is a follow up tele-visit via phone. The patient is at home. MD is at office. Prior to scheduling this appointment, our staff discussed the limitations of evaluation and management by telemedicine and the availability of in-person appointments. The patient expressed understanding and agreed to proceed.   Caitlyn [name] is a 44 year old woman who presents for right sided periscapular pain  1) Right sided periscapular pain -gets relief from oxycodone  and robaxin  -increase in robaxin  was helpful -gets relief from heat and ice as well -has never had an injection in this area  2) Urinary incontinence -resolved   Review of Systems     Objective:   Physical Exam Not performed       Assessment & Plan:  1) Right sided periscapular pain and instability -right shoulder cryo-orthosis ordered for instability and pain -discussed that MRI shows no acute tears but does show edema of the deltoid -recommended continuing ice and heat therapy -continue oxycodone  and robaxin  for pain relief -plan for trigger point injections next visit if pain has not resolved  2) Urinary incontinence -discussed that this has resolved  "

## 2024-06-19 NOTE — Progress Notes (Signed)
 Recreational Therapy Discharge Summary Patient Details  Name: Caitlyn [name] MRN: [id number] Date of Birth: 1981/01/11 Today's Date: 06/19/2024   Comments on progress toward goals: Pt made great progress during LOS and discharged home at overall Mod I w/c level.  TR sessions focused on pt education emphasizing importance of leisure & its effect on all aspects of wellness (social, emotional, psychological, physical & spiritual).  Discussed activity analysis/modifications and coping/stress management.  Pt participated in simulated community reintegration task at overall supervision w/c level and contact guard assist for short distance ambulation with RW.  Pt is excited for discharge and looking forward to returning to previously enjoyed activities as able.  Reasons for discharge: discharge from hospital  Follow-up: Outpatient  Patient/family agrees with progress made and goals achieved: Yes  Neilson Oehlert 06/19/2024, 11:39 AM

## 2024-06-26 ENCOUNTER — Other Ambulatory Visit: Payer: Self-pay

## 2024-07-04 ENCOUNTER — Encounter: Admitting: Physical Medicine and Rehabilitation

## 2024-07-08 ENCOUNTER — Other Ambulatory Visit: Payer: Self-pay

## 2024-07-08 ENCOUNTER — Other Ambulatory Visit (HOSPITAL_COMMUNITY): Payer: Self-pay

## 2024-07-08 NOTE — Telephone Encounter (Signed)
 Copied from CRM #[id number]. Topic: Clinical - Medication Refill >> Jul 08, 2024 10:45 AM Donna BRAVO wrote: Medication:  gabapentin  (NEURONTIN ) 100 MG capsule --have no pills left oxyCODONE  (OXY IR/ROXICODONE ) 5 MG immediate release tablet -- have 2 tablets left  Has the patient contacted their pharmacy? No (Agent: If no, request that the patient contact the pharmacy for the refill. If patient does not wish to contact the pharmacy document the reason why and proceed with request.) (Agent: If yes, when and what did the pharmacy advise?)  This is the patient's preferred pharmacy:   [facility] [address] [place] - [address] [place] [address] Phone: [phone] Fax: [phone]   Is this the correct pharmacy for this prescription? Yes If no, delete pharmacy and type the correct one.   Has the prescription been filled recently? Yes  Is the patient out of the medication? Yes  Has the patient been seen for an appointment in the last year OR does the patient have an upcoming appointment? Yes  Can we respond through MyChart? Yes  Agent: Please be advised that Rx refills may take up to 3 business days. We ask that you follow-up with your pharmacy.  Please Advise.  CB.

## 2024-07-10 ENCOUNTER — Other Ambulatory Visit: Payer: Self-pay | Admitting: Nurse Practitioner

## 2024-07-11 ENCOUNTER — Other Ambulatory Visit: Payer: Self-pay | Admitting: Nurse Practitioner

## 2024-07-11 MED ORDER — AMLODIPINE BESYLATE 5 MG PO TABS: 5.0000 mg | ORAL_TABLET | Freq: Every day | ORAL | 0 refills | Status: AC

## 2024-07-11 MED ORDER — GLIPIZIDE 5 MG PO TABS: 5.0000 mg | ORAL_TABLET | Freq: Every day | ORAL | 0 refills | Status: AC

## 2024-07-11 NOTE — Telephone Encounter (Signed)
 Copied from CRM #[id number]. Topic: Clinical - Medication Refill >> Jul 11, 2024  1:31 PM Rosaria E wrote: Medication: glipiZIDE  (GLUCOTROL ) 5 MG tablet amLODipine  (NORVASC ) 5 MG tablet  Has the patient contacted their pharmacy? Yes (Agent: If no, request that the patient contact the pharmacy for the refill. If patient does not wish to contact the pharmacy document the reason why and proceed with request.) (Agent: If yes, when and what did the pharmacy advise?)  This is the patient's preferred pharmacy:  [facility] [address], [place] - [address] [place] [address] Phone: [phone] Fax: [phone]  Is this the correct pharmacy for this prescription? Yes If no, delete pharmacy and type the correct one.   Has the prescription been filled recently? Yes  Is the patient out of the medication? No.   Has the patient been seen for an appointment in the last year OR does the patient have an upcoming appointment? Yes  Can we respond through MyChart? Yes  Agent: Please be advised that Rx refills may take up to 3 business days. We ask that you follow-up with your pharmacy.

## 2024-07-18 ENCOUNTER — Telehealth: Payer: Self-pay

## 2024-07-18 NOTE — Telephone Encounter (Signed)
 Copied from CRM [id number]. Topic: Clinical - Order For Equipment >> Jul 18, 2024  2:28 PM Wess RAMAN wrote: Reason for CRM: Patient is calling for an order for a wheelchair. Patient's insurance is requiring information as to why she would need a wheel chair. For example her diagnosis, like (hip, ankle and pelvic surgery) and they type of wheelchair (20x16 wheelchair with bilateral elevating leg rest). She has to have assistance with going everywhere.  She needs it sent to: [facility] Phone: [phone] Fax: [phone]  Also need a cover sheet with patient's name, DOB, insurance co, and patient's address.   Done [facility]

## 2024-07-29 ENCOUNTER — Encounter: Admitting: Physical Medicine and Rehabilitation

## 2024-08-07 ENCOUNTER — Ambulatory Visit: Payer: Self-pay | Admitting: Nurse Practitioner
